# Patient Record
Sex: Male | Born: 1940 | Race: White | Hispanic: No | Marital: Married | Smoking: Former smoker
Health system: Southern US, Community
[De-identification: ages and names within clinical notes are randomized; demographics above are authoritative.]

## PROBLEM LIST (undated history)

## (undated) DIAGNOSIS — I499 Cardiac arrhythmia, unspecified: Secondary | ICD-10-CM

## (undated) DIAGNOSIS — N200 Calculus of kidney: Secondary | ICD-10-CM

## (undated) DIAGNOSIS — K219 Gastro-esophageal reflux disease without esophagitis: Secondary | ICD-10-CM

## (undated) DIAGNOSIS — G919 Hydrocephalus, unspecified: Secondary | ICD-10-CM

## (undated) DIAGNOSIS — E785 Hyperlipidemia, unspecified: Secondary | ICD-10-CM

## (undated) DIAGNOSIS — G629 Polyneuropathy, unspecified: Secondary | ICD-10-CM

## (undated) DIAGNOSIS — C801 Malignant (primary) neoplasm, unspecified: Secondary | ICD-10-CM

## (undated) DIAGNOSIS — Z972 Presence of dental prosthetic device (complete) (partial): Secondary | ICD-10-CM

## (undated) DIAGNOSIS — B019 Varicella without complication: Secondary | ICD-10-CM

## (undated) DIAGNOSIS — I471 Supraventricular tachycardia: Secondary | ICD-10-CM

## (undated) DIAGNOSIS — R51 Headache: Secondary | ICD-10-CM

## (undated) DIAGNOSIS — R519 Headache, unspecified: Secondary | ICD-10-CM

## (undated) DIAGNOSIS — M1990 Unspecified osteoarthritis, unspecified site: Secondary | ICD-10-CM

## (undated) DIAGNOSIS — F32A Depression, unspecified: Secondary | ICD-10-CM

## (undated) DIAGNOSIS — Z87442 Personal history of urinary calculi: Secondary | ICD-10-CM

## (undated) HISTORY — PX: SKIN CANCER EXCISION: SHX779

## (undated) HISTORY — PX: KIDNEY STONE SURGERY: SHX686

## (undated) HISTORY — PX: VASECTOMY: SHX75

## (undated) HISTORY — PX: TONSILLECTOMY: SUR1361

## (undated) HISTORY — DX: Gastro-esophageal reflux disease without esophagitis: K21.9

## (undated) HISTORY — DX: Varicella without complication: B01.9

---

## 2005-05-14 HISTORY — PX: PROSTATE BIOPSY: SHX241

## 2006-05-14 HISTORY — PX: CARDIAC CATHETERIZATION: SHX172

## 2007-05-15 DIAGNOSIS — I471 Supraventricular tachycardia, unspecified: Secondary | ICD-10-CM

## 2007-05-15 HISTORY — DX: Supraventricular tachycardia: I47.1

## 2007-05-15 HISTORY — DX: Supraventricular tachycardia, unspecified: I47.10

## 2008-05-14 HISTORY — PX: HEMORROIDECTOMY: SUR656

## 2013-04-14 ENCOUNTER — Ambulatory Visit: Payer: Medicare Other | Admitting: Podiatry

## 2013-04-14 ENCOUNTER — Encounter: Payer: Self-pay | Admitting: Podiatry

## 2013-04-14 VITALS — BP 121/77 | HR 78 | Resp 16 | Ht 70.0 in | Wt 190.0 lb

## 2013-04-14 DIAGNOSIS — M79609 Pain in unspecified limb: Secondary | ICD-10-CM

## 2013-04-14 DIAGNOSIS — L600 Ingrowing nail: Secondary | ICD-10-CM

## 2013-04-14 DIAGNOSIS — B351 Tinea unguium: Secondary | ICD-10-CM

## 2013-04-14 NOTE — Progress Notes (Signed)
Subjective:     Patient ID: Kou Gucciardo, male   DOB: 30-Sep-1940, 72 y.o.   MRN: [id number]  Toe Pain    patient states that I have chronic ingrown toenails of my big toe both feet and all of my nails become sore in the corners and are impossible for me to cut. Has tried wider shoes and soaks without relief and has seen previous doctor who trim them   Review of Systems  All other systems reviewed and are negative.       Objective:   Physical Exam  Nursing note and vitals reviewed. Constitutional: He is oriented to person, place, and time. He appears well-nourished.  Cardiovascular: Intact distal pulses.   Musculoskeletal: Normal range of motion.  Neurological: He is oriented to person, place, and time.  Skin: Skin is warm.   neurovascular status intact with incurvated lateral borders hallux bilateral that are tender when pressed and all nails have a mild amount of incurvation in the corners noted. Muscle strength adequate and no equinus condition was noted     Assessment:     Ingrown toenail hallux both feet and nail disease 235 both feet    Plan:     H&P performed and discussed treatment options. He would like 8 per minute procedure and I have recommended permanent removal of the corners hallux both feet and discussed risks associated with this. Patient wants procedures and today I infiltrated each hallux 60 mg Xylocaine Marcaine mixture remove the lateral corners exposed matrix and apply chemical phenol 3 applications followed by alcohol lavaged and sterile dressing. Instructed on soaks and today debrided remaining nails which gave him relief of low grade symptoms reappoint to recheck

## 2013-04-14 NOTE — Patient Instructions (Signed)

## 2013-04-14 NOTE — Progress Notes (Signed)
N PAIN L TRIM TOENAILS D YRS O SLOWLY C WORSE A AFTER TAKING SHOWERS T PEDICURE, PT TRIMS TOENAILS

## 2013-04-17 ENCOUNTER — Ambulatory Visit (INDEPENDENT_AMBULATORY_CARE_PROVIDER_SITE_OTHER): Payer: Medicare Other | Admitting: Podiatry

## 2013-04-17 ENCOUNTER — Encounter: Payer: Self-pay | Admitting: Podiatry

## 2013-04-17 VITALS — BP 146/87 | HR 71 | Resp 16

## 2013-04-17 DIAGNOSIS — L03039 Cellulitis of unspecified toe: Secondary | ICD-10-CM

## 2013-04-17 MED ORDER — CEPHALEXIN 500 MG PO CAPS: 500.0000 mg | ORAL_CAPSULE | Freq: Three times a day (TID) | ORAL | Status: DC

## 2013-04-18 NOTE — Progress Notes (Signed)
Subjective:     Patient ID: Adam Juarez, male   DOB: 11-30-1940, 72 y.o.   MRN: [id number]  HPI patient presents stating that I wouldn't wanted to get my nails checked as they are still red and crusting   Review of Systems     Objective:   Physical Exam Neurovascular status intact with no health history changes noted and some redness left hallux lateral side it's localized with no proximal edema erythema drainage or lymph node distention    Assessment:     Mild paronychia of the left hallux lateral    Plan:     Changes soaks to Epsom salts at the current time and placed on cephalexin 500 mg 3 times a day. If symptoms persist or do not improve patient is to let us know immediately

## 2013-05-15 ENCOUNTER — Telehealth: Payer: Self-pay | Admitting: *Deleted

## 2013-05-15 MED ORDER — CEPHALEXIN 500 MG PO CAPS: 500.0000 mg | ORAL_CAPSULE | Freq: Three times a day (TID) | ORAL | Status: DC

## 2013-05-15 NOTE — Telephone Encounter (Signed)
Pt called stated that his toe was still infected after the two rounds of antibiotics , i went ahead and refilled the prescription and told him to continue with soaking and if it continues not to get any better to schedule an appt with Dr Paulla Dolly

## 2013-07-14 ENCOUNTER — Encounter: Payer: Self-pay | Admitting: Podiatry

## 2013-07-14 ENCOUNTER — Ambulatory Visit: Payer: Medicare Other | Admitting: Podiatry

## 2013-07-14 VITALS — BP 154/93 | HR 83 | Resp 16 | Ht 70.0 in | Wt 194.0 lb

## 2013-07-14 DIAGNOSIS — L03039 Cellulitis of unspecified toe: Secondary | ICD-10-CM

## 2013-07-14 DIAGNOSIS — M79609 Pain in unspecified limb: Secondary | ICD-10-CM

## 2013-07-14 NOTE — Progress Notes (Signed)
Subjective:     Patient ID: Adam Juarez, male   DOB: 01/27/1941, 73 y.o.   MRN: [id number]  HPI patient presents with drainage on the lateral side of the right hallux which has been around but it's just near its 8. He doesn't have any trouble wearing shoe gear walking but he is just concerned   Review of Systems     Objective:   Physical Exam Nailbed found to be thickened with incurvation of the lateral side with very mild odor with no proximal edema erythema or drainage noted    Assessment:     Local paronychia infection right hallux with thick mycotic nails without any indication of proximal extension    Plan:     Debrided lateral side of nail bed and instructed on soaks and applied Band-Aid. Patient will begin topical antifungal and will be seen back if the symptoms were to persist. I did explain to him he is probably traumatized his nail and that is why is thickened away it has and is irritated

## 2013-07-20 ENCOUNTER — Ambulatory Visit: Payer: Self-pay | Admitting: Family Medicine

## 2013-08-08 ENCOUNTER — Inpatient Hospital Stay: Payer: Self-pay | Admitting: Family Medicine

## 2013-08-08 LAB — BASIC METABOLIC PANEL
Anion Gap: 6 — ABNORMAL LOW (ref 7–16)
BUN: 21 mg/dL — ABNORMAL HIGH (ref 7–18)
Calcium, Total: 8.9 mg/dL (ref 8.5–10.1)
Chloride: 108 mmol/L — ABNORMAL HIGH (ref 98–107)
Co2: 27 mmol/L (ref 21–32)
Creatinine: 0.98 mg/dL (ref 0.60–1.30)
EGFR (African American): >60
EGFR (Non-African Amer.): >60
Glucose: 104 mg/dL — ABNORMAL HIGH (ref 65–99)
Osmolality: 285 (ref 275–301)
POTASSIUM: 3.8 mmol/L (ref 3.5–5.1)
Sodium: 141 mmol/L (ref 136–145)

## 2013-08-08 LAB — CBC
HCT: 42.9 % (ref 40.0–52.0)
HGB: 15 g/dL (ref 13.0–18.0)
MCH: 32.5 pg (ref 26.0–34.0)
MCHC: 35 g/dL (ref 32.0–36.0)
MCV: 93 fL (ref 80–100)
Platelet: 150 10*3/uL (ref 150–440)
RBC: 4.62 10*6/uL (ref 4.40–5.90)
RDW: 13.8 % (ref 11.5–14.5)
WBC: 5.1 10*3/uL (ref 3.8–10.6)

## 2013-08-08 LAB — PRO B NATRIURETIC PEPTIDE: B-TYPE NATIURETIC PEPTID: 39 pg/mL (ref 0–125)

## 2013-08-08 LAB — TROPONIN I

## 2013-08-09 LAB — TROPONIN I: Troponin-I: <0.02 ng/mL

## 2013-09-11 DIAGNOSIS — I499 Cardiac arrhythmia, unspecified: Secondary | ICD-10-CM

## 2013-09-11 HISTORY — DX: Cardiac arrhythmia, unspecified: I49.9

## 2013-10-13 ENCOUNTER — Emergency Department: Payer: Self-pay | Admitting: Emergency Medicine

## 2013-10-13 LAB — URINALYSIS, COMPLETE
Bacteria: NONE SEEN
Bilirubin,UR: NEGATIVE
Blood: NEGATIVE
Glucose,UR: NEGATIVE mg/dL (ref 0–75)
KETONE: NEGATIVE
LEUKOCYTE ESTERASE: NEGATIVE
Nitrite: NEGATIVE
PROTEIN: NEGATIVE
Ph: 5 (ref 4.5–8.0)
RBC,UR: 25 /HPF (ref 0–5)
SPECIFIC GRAVITY: 1.029 (ref 1.003–1.030)
SQUAMOUS EPITHELIAL: NONE SEEN
WBC UR: 2 /HPF (ref 0–5)

## 2013-10-13 LAB — COMPREHENSIVE METABOLIC PANEL
ALT: 26 U/L (ref 12–78)
ANION GAP: 6 — AB (ref 7–16)
AST: 34 U/L (ref 15–37)
Albumin: 4.1 g/dL (ref 3.4–5.0)
Alkaline Phosphatase: 88 U/L
BUN: 24 mg/dL — AB (ref 7–18)
Bilirubin,Total: 0.5 mg/dL (ref 0.2–1.0)
CREATININE: 1.16 mg/dL (ref 0.60–1.30)
Calcium, Total: 9.2 mg/dL (ref 8.5–10.1)
Chloride: 106 mmol/L (ref 98–107)
Co2: 25 mmol/L (ref 21–32)
EGFR (African American): >60
Glucose: 83 mg/dL (ref 65–99)
Osmolality: 277 (ref 275–301)
Potassium: 4.1 mmol/L (ref 3.5–5.1)
Sodium: 137 mmol/L (ref 136–145)
Total Protein: 7.8 g/dL (ref 6.4–8.2)

## 2013-10-13 LAB — CBC
HCT: 45.2 % (ref 40.0–52.0)
HGB: 15.4 g/dL (ref 13.0–18.0)
MCH: 32.2 pg (ref 26.0–34.0)
MCHC: 34.2 g/dL (ref 32.0–36.0)
MCV: 94 fL (ref 80–100)
Platelet: 156 10*3/uL (ref 150–440)
RBC: 4.79 10*6/uL (ref 4.40–5.90)
RDW: 13.6 % (ref 11.5–14.5)
WBC: 6.3 10*3/uL (ref 3.8–10.6)

## 2013-11-04 ENCOUNTER — Encounter (HOSPITAL_COMMUNITY): Payer: Self-pay | Admitting: Pharmacy Technician

## 2013-11-04 ENCOUNTER — Other Ambulatory Visit: Payer: Self-pay | Admitting: Urology

## 2013-11-05 ENCOUNTER — Encounter (HOSPITAL_COMMUNITY): Payer: Self-pay | Admitting: *Deleted

## 2013-11-05 NOTE — Progress Notes (Signed)
patient states he was at [facility] with irregular Heart rate stayed over night and flipped back into a regular heart rthym No other procedures done and sent home on no medications. Denies any chest pain or heart attack.  Contacted [place] [facility] for records.

## 2013-11-05 NOTE — Progress Notes (Signed)
Get patient in for EKG ASAP and will need read prior to ESWL.

## 2013-11-05 NOTE — Progress Notes (Signed)
Call to [place] at Dr. Shaune Leeks office that patient records from [facility] indicate he did not have any EKG or cardiac work up and eloped from the ER without any treatment or follow up. Lab work and CT abdomen

## 2013-11-06 ENCOUNTER — Ambulatory Visit (HOSPITAL_COMMUNITY): Admission: RE | Admit: 2013-11-06 | Payer: Medicare Other | Source: Ambulatory Visit

## 2013-11-09 ENCOUNTER — Encounter (HOSPITAL_COMMUNITY): Payer: Self-pay | Admitting: *Deleted

## 2013-11-09 ENCOUNTER — Encounter (HOSPITAL_COMMUNITY)
Admission: RE | Disposition: A | Discharge status: Home / Self Care | Payer: Self-pay | Source: Ambulatory Visit | Attending: Urology

## 2013-11-09 ENCOUNTER — Ambulatory Visit (HOSPITAL_COMMUNITY)
Admission: RE | Admit: 2013-11-09 | Discharge: 2013-11-09 | Disposition: A | Discharge status: Home / Self Care | Payer: Medicare Other | Source: Ambulatory Visit | Attending: Urology | Admitting: Urology

## 2013-11-09 ENCOUNTER — Ambulatory Visit (HOSPITAL_COMMUNITY): Payer: Medicare Other

## 2013-11-09 DIAGNOSIS — Z87442 Personal history of urinary calculi: Secondary | ICD-10-CM | POA: Insufficient documentation

## 2013-11-09 DIAGNOSIS — Z87891 Personal history of nicotine dependence: Secondary | ICD-10-CM | POA: Insufficient documentation

## 2013-11-09 DIAGNOSIS — Z79899 Other long term (current) drug therapy: Secondary | ICD-10-CM | POA: Insufficient documentation

## 2013-11-09 DIAGNOSIS — K219 Gastro-esophageal reflux disease without esophagitis: Secondary | ICD-10-CM | POA: Insufficient documentation

## 2013-11-09 DIAGNOSIS — E7800 Pure hypercholesterolemia, unspecified: Secondary | ICD-10-CM | POA: Insufficient documentation

## 2013-11-09 DIAGNOSIS — I4891 Unspecified atrial fibrillation: Secondary | ICD-10-CM | POA: Insufficient documentation

## 2013-11-09 DIAGNOSIS — Z01810 Encounter for preprocedural cardiovascular examination: Secondary | ICD-10-CM | POA: Insufficient documentation

## 2013-11-09 DIAGNOSIS — Z01818 Encounter for other preprocedural examination: Secondary | ICD-10-CM | POA: Insufficient documentation

## 2013-11-09 DIAGNOSIS — N201 Calculus of ureter: Secondary | ICD-10-CM | POA: Insufficient documentation

## 2013-11-09 DIAGNOSIS — Z85828 Personal history of other malignant neoplasm of skin: Secondary | ICD-10-CM | POA: Insufficient documentation

## 2013-11-09 HISTORY — DX: Calculus of kidney: N20.0

## 2013-11-09 HISTORY — DX: Unspecified osteoarthritis, unspecified site: M19.90

## 2013-11-09 HISTORY — DX: Cardiac arrhythmia, unspecified: I49.9

## 2013-11-09 HISTORY — DX: Malignant (primary) neoplasm, unspecified: C80.1

## 2013-11-09 SURGERY — LITHOTRIPSY, ESWL
Anesthesia: LOCAL | Laterality: Left

## 2013-11-09 MED ORDER — SODIUM CHLORIDE 0.9 % IV SOLN
INTRAVENOUS | Status: DC
  Administered 2013-11-09: 11:00:00 via INTRAVENOUS

## 2013-11-09 MED ORDER — DIAZEPAM 5 MG PO TABS
10.0000 mg | ORAL_TABLET | ORAL | Status: AC
  Administered 2013-11-09: 10 mg via ORAL
  Filled 2013-11-09: qty 2

## 2013-11-09 MED ORDER — ACETAMINOPHEN-CODEINE #3 300-30 MG PO TABS: 1.0000 | ORAL_TABLET | ORAL | Status: DC | PRN

## 2013-11-09 MED ORDER — CIPROFLOXACIN HCL 500 MG PO TABS
500.0000 mg | ORAL_TABLET | ORAL | Status: AC
  Administered 2013-11-09: 500 mg via ORAL
  Filled 2013-11-09: qty 1

## 2013-11-09 MED ORDER — DIPHENHYDRAMINE HCL 25 MG PO CAPS
25.0000 mg | ORAL_CAPSULE | ORAL | Status: AC
  Administered 2013-11-09: 25 mg via ORAL
  Filled 2013-11-09: qty 1

## 2013-11-09 NOTE — H&P (Signed)
Reason For Visit left 84mm UVJ stone   History of Present Illness Adam Juarez referred by Dr. Leodis Sias, MD for evaluation and management of nephrolithiasis.  Patient intially presented to the [facility] ED with acute onset left flank pain. He was not seen - left early b/c of the wait times. He did get a CT scan. He then followed up with his PCP and was referred to Urology, however he delayed his appt because of a trip. Currently the patient denies any significant pain he does endorse urinary urgency and frequency. He denies any suprapubic pain or dysuria. Denies any fever/chills. The patient has a history of kidney stones, he ultimately needed a ureteroscopy for a 3 mm stone many years ago. The patient was given Flomax for medical expulsion therapy which he has not started yet.   Past Medical History Problems  1. History of atrial fibrillation (V12.59) 2. History of basal cell carcinoma (V10.83) 3. History of esophageal reflux (V12.79) 4. History of hypercholesterolemia (V12.29) 5. 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 History of Surgery Of Male Genitalia Vasectomy  Current Meds 1. Co Q-10 CAPS;  Therapy: (Recorded:23Jun2015) to Recorded 2. Glucosamine Chondroitin TABS;  Therapy: (Recorded:23Jun2015) to Recorded 3. Ibuprofen 600 MG Oral Tablet;  Therapy: (WUJWJXBJ:[id number]) to Recorded 4. Magnesium TABS;  Therapy: (HYQMVHQI:[id number]) to Recorded 5. Multiple Vitamin TABS;  Therapy: (Recorded:23Jun2015) to Recorded 6. Omeprazole 20 MG Oral Tablet Delayed Release;  Therapy: (Recorded:23Jun2015) to Recorded 7. Probiotic CAPS;  Therapy: (Recorded:23Jun2015) to Recorded 8. Vitamin B12 TABS;  Therapy: (Recorded:23Jun2015) to Recorded 9. Vitamin C TABS;  Therapy: (Recorded:23Jun2015) to Recorded  Allergies Medication  1. No Known Drug Allergies Non-Medication  2. Bee sting  Family History Problems  1. Family history of aortic aneurysm (V17.49)  : Father 2. Family history of Parkinson's disease (V17.2) : Mother  Social History Problems    Denied: History of Alcohol use   Caffeine use (V49.89)   Death in the family, father   age 62   Death in the family, mother   age 9   Former smoker (V15.82)   quit 2000   Married   Retired   Two children  Review of Systems Genitourinary, constitutional, skin, eye, otolaryngeal, hematologic/lymphatic, cardiovascular, pulmonary, endocrine, musculoskeletal, gastrointestinal, neurological and psychiatric system(s) were reviewed and pertinent findings if present are noted.  Genitourinary: nocturia and initiating urination requires straining, but urine stream is not weak.  Gastrointestinal: heartburn.    Vitals Vital Signs [Data Includes: Last 1 Day]  Recorded: 23Jun2015 12:49PM  Height: 5 ft 10 in Weight: 195 lb  BMI Calculated: 27.98 BSA Calculated: 2.06 Blood Pressure: 132 / 87 Temperature: 98 F Heart Rate: 75  Physical Exam Constitutional: Well nourished and well developed . No acute distress.  ENT:. The ears and nose are normal in appearance.  Neck: The appearance of the neck is normal and no neck mass is present.  Pulmonary: No respiratory distress, normal respiratory rhythm and effort and clear bilateral breath sounds.  Cardiovascular: Heart rate and rhythm are normal . The arterial pulses are normal. No peripheral edema.  Abdomen: The abdomen is soft and nontender. No masses are palpated. No CVA tenderness. No hernias are palpable. No hepatosplenomegaly noted.  Lymphatics: The femoral and inguinal nodes are not enlarged or tender.  Skin: Normal skin turgor, no visible rash and no visible skin lesions.  Neuro/Psych:. Mood and affect are appropriate.    Results/Data Urine [Data Includes: Last 1 Day]   [id number]  COLOR  YELLOW   APPEARANCE CLEAR   SPECIFIC GRAVITY 1.025   pH 5.5   GLUCOSE NEG mg/dL  BILIRUBIN NEG   KETONE NEG mg/dL  BLOOD NEG   PROTEIN NEG  mg/dL  UROBILINOGEN 0.2 mg/dL  NITRITE NEG   LEUKOCYTE ESTERASE NEG    The following images/tracing/specimen were independently visualized: . CT scan from 10/13/13. Left hydronephrosis/hydroureter with 52mm stone proximal to the left UVJ. No other stones within the collecting system   KUB was performed in our clinic today to see if the stone easily localized. There is a calcification noted in the left distal ureter consistent with a constipation seen on the patient's CT scout film. There are additional phleboliths within the pelvis but localized more lateral than would be expected for the trajectory of the ureter on the left. The renal shadows are visible bilaterally with no additional identifiable stones.    Assessment Assessed  1. Left ureteral calculus (592.1)  The patient has a 5.5 mm distal ureteral stone and no evidence of infection and pain currently well controlled. She   Plan Health Maintenance  1. UA With REFLEX; [Do Not Release]; Status:Complete;   Done: [id number] 12:12PM Left ureteral calculus  2. Follow-up Schedule Surgery Office  Follow-up  Status: Complete  Done: [id number] 3. KUB; Status:Complete;   Done: 04UGQ9169 12:00AM  Discussion/Summary I discussed the management options with the patient including continued medical expulsion therapy, shockwave lithotripsy and ureteroscopy. We went over the pros and cons of all three and have collectively decided to perform shockwave lithotripsy. I Went over the risks and benefits of this operation as well as the procedure in detail. We'll work on getting the patient scheduled as soon as possible. In the meantime, I have encouraged the patient to start his Flomax, and if he passes his stone he should call and cancel his operation.

## 2013-11-09 NOTE — Discharge Instructions (Signed)
See [facility] discharge instructions in chart.  

## 2013-11-09 NOTE — Op Note (Signed)
See [facility] OP note scanned into chart. 

## 2014-05-20 DIAGNOSIS — F331 Major depressive disorder, recurrent, moderate: Secondary | ICD-10-CM | POA: Diagnosis not present

## 2014-06-25 DIAGNOSIS — L570 Actinic keratosis: Secondary | ICD-10-CM | POA: Diagnosis not present

## 2014-06-25 DIAGNOSIS — Z85828 Personal history of other malignant neoplasm of skin: Secondary | ICD-10-CM | POA: Diagnosis not present

## 2014-06-25 DIAGNOSIS — X32XXXA Exposure to sunlight, initial encounter: Secondary | ICD-10-CM | POA: Diagnosis not present

## 2014-07-05 DIAGNOSIS — Z8659 Personal history of other mental and behavioral disorders: Secondary | ICD-10-CM | POA: Diagnosis not present

## 2014-09-04 NOTE — Discharge Summary (Signed)
PATIENT NAME:  Adam Juarez, Adam Juarez MR#:  [id number] DATE OF BIRTH:  Oct 01, 1940  DATE OF ADMISSION:  08/08/2013 DATE OF DISCHARGE:  08/09/2013  DISCHARGE DIAGNOSES:  1. Brief episode of atrial fibrillation with rapid ventricular response now sinus rhythm.  2. Gastroesophageal reflux disease.   DISCHARGE MEDICATIONS:  1. Aspirin 81 mg p.o. daily.  2. Chondroitin glucosamine 1 daily.  3. Omeprazole 20 mg p.o. daily.   CONSULTS: None.   PROCEDURE: Had an echo performed that is still pending.   PERTINENT LABORATORIES AND STUDIES: On the day of discharge: Cardiac enzymes were negative x 3. Heart rate is 80s to 90s. Sodium 141, potassium 3.8, creatinine 0.98. White blood cell count 5.1, hemoglobin 15, platelets 150,000. TSH pending.   Chest x-ray was negative.   BRIEF HOSPITAL COURSE: Atrial fibrillation with RVR. The patient initially came in with a heart rate elevated with EKG consistent with atrial fibrillation with elevated heart rate. He denies any further symptoms at this time. No chest pain, no palpitations, no syncope. No dizziness, vision changes. He declines any beta blocker or rate-controlling agent at this time. He is back in normal sinus rhythm. Will continue on an aspirin and will discuss with him further after he researched the metoprolol whether or not to start this medication as an outpatient. He will follow up this week. We will repeat an EKG at that time and reassess.   DISPOSITION: He is in stable condition to be discharged to home.   FOLLOWUP: Follow up with me in the next 3 to 4 days.     ____________________________ Dion Body, MD kl:lt D: 08/09/2013 11:32:28 ET T: 08/09/2013 20:48:25 ET JOB#: [id number]  cc: Dion Body, MD, <Dictator> Dion Body MD ELECTRONICALLY SIGNED 08/17/2013 10:36

## 2014-09-04 NOTE — H&P (Signed)
PATIENT NAME:  Adam Juarez, Adam Juarez MR#:  [id number] DATE OF BIRTH:  1941/03/13  DATE OF ADMISSION:  08/08/2013  CHIEF COMPLAINT: Palpitations.   HISTORY OF PRESENT ILLNESS: A 74 year old Caucasian male with a past medical history of gastroesophageal reflux disease, as well as SVT, presenting with palpitations. He describes acute onset of palpitations describes as fluttering in the chest as well as associated retrosternal chest pain described as pressure, a 3 in intensity, radiating to left arm. No worsening or relieving factors. Tried vagal maneuvers. Had no improvement. Therefore presented to ED for further workup and evaluation. On arrival, found to be in atrial fibrillation, heart rate in the 110s. Received Cardizem IV x 1, heart rate now rate controlled in the 80s to 90s however, still feels periodic palpitations. Otherwise, no further complaints.   REVIEW OF SYSTEMS:  CONSTITUTIONAL: Denies fever, fatigue, weakness. EYES: Denies blurry, double vision, or eye pain.  HEENT: Denies tinnitus, ear pain, hearing loss.  RESPIRATORY: Denies cough, wheeze, shortness of breath. CARDIOVASCULAR: Positive for palpitations and chest pain as described above. Denies any orthopnea, edema.  GASTROINTESTINAL: Denies nausea, vomiting, diarrhea, abdominal pain.  GENITOURINARY: Denies history of hematuria.  ENDOCRINE: Denies nocturia or thyroid problems.  HEMATOLOGIC AND LYMPHATIC: Denies easy bruising, bleeding. SKIN: Denies rash or lesions. MUSCULOSKELETAL: Denies pain in neck, back, shoulders, knees or hips.  NEUROLOGIC: Denies paralysis or paresthesias.  PSYCHIATRIC: Denies anxiety or depressive symptoms.   Otherwise, full review of systems performed by me and is negative.  PAST MEDICAL HISTORY: Gastroesophageal reflux disease.   SOCIAL HISTORY: Denies alcohol, tobacco, or drug usage.   FAMILY HISTORY: Denies any known cardiovascular illnesses.   ALLERGIES: BEE STINGS.   HOME MEDICATIONS: Include  Prilosec 20 mg p.o. daily.   PHYSICAL EXAMINATION:  VITAL SIGNS: Temperature 97.8, heart rate on arrival 122, respirations 18, blood pressure 163/107, saturating 94% on room air. Weight 88.5 kg BMI 28.   GENERAL: Well-nourished, well-developed Caucasian male in, no acute distress.  HEAD: Normocephalic, atraumatic.  EYES: Pupils equal, round, reactive to light. Extraocular muscles intact. No scleral icterus.  MOUTH: Moist mucous membranes.  Dentition poor. No abscess noted.  EARS, NOSE, AND THROAT: Clear. No exudates. No external lesions.  NECK: Supple. No thyromegaly. No nodules. No JVD.  PULMONARY: Clear to auscultation bilaterally without wheezes, rales, or rhonchi. No use of accessory muscles. Good respiratory effort.  CHEST:  Nontender on palpation.  CARDIOVASCULAR: S1, S2, irregular rate, irregular rhythm. No murmurs, rubs, or gallops. No edema. Pedal pulses 2+ bilaterally.  GASTROINTESTINAL: Soft, nontender, nondistended. No masses. Positive bowel sounds. No hepatosplenomegaly.  MUSCULOSKELETAL: No swelling, clubbing, or edema. Range of motion is full in all extremities. NEUROLOGIC: Cranial nerves II through XII intact. No gross neurologic deficits. Sensation intact. Reflexes intact.  SKIN: No ulcerations, lesions, no rashes, or cyanosis. Skin warm and dry. Turgor intact. PSYCHIATRIC: Mood and affect within normal limits. The patient alert and oriented x 3. Insight and judgment intact.   LABORATORY DATA: EKG performed: Atrial fibrillation . Heart rate of 106. Remainder of laboratory data: Sodium 141, potassium 3.8, chloride 108, bicarbonate 27, BUN 21, creatinine 0.98, glucose 104. Troponin I less than 0.02. WBC 5.1, hemoglobin 15, platelets of 150,000. Chest x-ray performed revealing no acute cardiopulmonary process.   ASSESSMENT AND PLAN: A 74 year old Caucasian gentleman with history of supraventricular tachycardia, presenting with palpitations. 1. New onset atrial fibrillation. Admit  to telemetry. Trend cardiac enzymes x 3. Will check a transthoracic echocardiogram as well as TSH. He is currently rate  controlled. [place] score of zero. We will start aspirin. 2. Gastroesophageal reflux disease. Continue proton pump inhibitor therapy.  3. Venous thromboembolism prophylaxis with heparin subcutaneous.   CODE STATUS: The patient is full code.   TIME SPENT: 45 minutes    ____________________________ Aaron Mose. Iain Sawchuk, MD dkh:lt D: 08/09/2013 00:19:42 ET T: 08/09/2013 01:54:04 ET JOB#: [id number]  cc: Aaron Mose. Jovany Disano, MD, <Dictator> Kayliana Codd Woodfin Ganja MD ELECTRONICALLY SIGNED 08/09/2013 2:59

## 2014-10-21 DIAGNOSIS — I479 Paroxysmal tachycardia, unspecified: Secondary | ICD-10-CM | POA: Diagnosis not present

## 2014-10-21 DIAGNOSIS — K219 Gastro-esophageal reflux disease without esophagitis: Secondary | ICD-10-CM | POA: Diagnosis not present

## 2015-03-08 DIAGNOSIS — H2513 Age-related nuclear cataract, bilateral: Secondary | ICD-10-CM | POA: Diagnosis not present

## 2015-03-08 DIAGNOSIS — H353131 Nonexudative age-related macular degeneration, bilateral, early dry stage: Secondary | ICD-10-CM | POA: Diagnosis not present

## 2015-03-16 DIAGNOSIS — L239 Allergic contact dermatitis, unspecified cause: Secondary | ICD-10-CM | POA: Diagnosis not present

## 2015-04-14 DIAGNOSIS — K219 Gastro-esophageal reflux disease without esophagitis: Secondary | ICD-10-CM | POA: Diagnosis not present

## 2015-04-14 DIAGNOSIS — Z0000 Encounter for general adult medical examination without abnormal findings: Secondary | ICD-10-CM | POA: Diagnosis not present

## 2015-04-21 DIAGNOSIS — K219 Gastro-esophageal reflux disease without esophagitis: Secondary | ICD-10-CM | POA: Diagnosis not present

## 2015-04-21 DIAGNOSIS — I479 Paroxysmal tachycardia, unspecified: Secondary | ICD-10-CM | POA: Diagnosis not present

## 2015-04-21 DIAGNOSIS — E781 Pure hyperglyceridemia: Secondary | ICD-10-CM | POA: Diagnosis not present

## 2015-04-21 DIAGNOSIS — M25552 Pain in left hip: Secondary | ICD-10-CM | POA: Diagnosis not present

## 2015-04-21 DIAGNOSIS — M25551 Pain in right hip: Secondary | ICD-10-CM | POA: Diagnosis not present

## 2015-04-28 DIAGNOSIS — M160 Bilateral primary osteoarthritis of hip: Secondary | ICD-10-CM | POA: Diagnosis not present

## 2015-06-24 DIAGNOSIS — D485 Neoplasm of uncertain behavior of skin: Secondary | ICD-10-CM | POA: Diagnosis not present

## 2015-06-24 DIAGNOSIS — Z08 Encounter for follow-up examination after completed treatment for malignant neoplasm: Secondary | ICD-10-CM | POA: Diagnosis not present

## 2015-06-24 DIAGNOSIS — Z85828 Personal history of other malignant neoplasm of skin: Secondary | ICD-10-CM | POA: Diagnosis not present

## 2015-06-24 DIAGNOSIS — D0439 Carcinoma in situ of skin of other parts of face: Secondary | ICD-10-CM | POA: Diagnosis not present

## 2015-06-24 DIAGNOSIS — D2239 Melanocytic nevi of other parts of face: Secondary | ICD-10-CM | POA: Diagnosis not present

## 2015-06-24 DIAGNOSIS — X32XXXA Exposure to sunlight, initial encounter: Secondary | ICD-10-CM | POA: Diagnosis not present

## 2015-06-24 DIAGNOSIS — L570 Actinic keratosis: Secondary | ICD-10-CM | POA: Diagnosis not present

## 2015-07-08 DIAGNOSIS — H2513 Age-related nuclear cataract, bilateral: Secondary | ICD-10-CM | POA: Diagnosis not present

## 2015-07-11 DIAGNOSIS — H2513 Age-related nuclear cataract, bilateral: Secondary | ICD-10-CM | POA: Diagnosis not present

## 2015-07-18 ENCOUNTER — Encounter: Payer: Self-pay | Admitting: *Deleted

## 2015-07-22 NOTE — Discharge Instructions (Signed)

## 2015-07-27 ENCOUNTER — Ambulatory Visit: Payer: Medicare Other | Admitting: Anesthesiology

## 2015-07-27 ENCOUNTER — Encounter
Admission: RE | Disposition: A | Discharge status: Home / Self Care | Payer: Self-pay | Source: Ambulatory Visit | Attending: Ophthalmology

## 2015-07-27 ENCOUNTER — Ambulatory Visit
Admission: RE | Admit: 2015-07-27 | Discharge: 2015-07-27 | Disposition: A | Discharge status: Home / Self Care | Payer: Medicare Other | Source: Ambulatory Visit | Attending: Ophthalmology | Admitting: Ophthalmology

## 2015-07-27 DIAGNOSIS — Z87442 Personal history of urinary calculi: Secondary | ICD-10-CM | POA: Insufficient documentation

## 2015-07-27 DIAGNOSIS — Z85828 Personal history of other malignant neoplasm of skin: Secondary | ICD-10-CM | POA: Diagnosis not present

## 2015-07-27 DIAGNOSIS — Z9889 Other specified postprocedural states: Secondary | ICD-10-CM | POA: Diagnosis not present

## 2015-07-27 DIAGNOSIS — I471 Supraventricular tachycardia: Secondary | ICD-10-CM | POA: Insufficient documentation

## 2015-07-27 DIAGNOSIS — H2512 Age-related nuclear cataract, left eye: Secondary | ICD-10-CM | POA: Insufficient documentation

## 2015-07-27 DIAGNOSIS — I4891 Unspecified atrial fibrillation: Secondary | ICD-10-CM | POA: Insufficient documentation

## 2015-07-27 DIAGNOSIS — K219 Gastro-esophageal reflux disease without esophagitis: Secondary | ICD-10-CM | POA: Insufficient documentation

## 2015-07-27 DIAGNOSIS — H269 Unspecified cataract: Secondary | ICD-10-CM | POA: Diagnosis present

## 2015-07-27 DIAGNOSIS — Z9852 Vasectomy status: Secondary | ICD-10-CM | POA: Diagnosis not present

## 2015-07-27 DIAGNOSIS — Z79899 Other long term (current) drug therapy: Secondary | ICD-10-CM | POA: Diagnosis not present

## 2015-07-27 DIAGNOSIS — Z87891 Personal history of nicotine dependence: Secondary | ICD-10-CM | POA: Diagnosis not present

## 2015-07-27 DIAGNOSIS — Z791 Long term (current) use of non-steroidal anti-inflammatories (NSAID): Secondary | ICD-10-CM | POA: Insufficient documentation

## 2015-07-27 DIAGNOSIS — H2513 Age-related nuclear cataract, bilateral: Secondary | ICD-10-CM | POA: Diagnosis not present

## 2015-07-27 HISTORY — DX: Supraventricular tachycardia: I47.1

## 2015-07-27 HISTORY — DX: Headache, unspecified: R51.9

## 2015-07-27 HISTORY — PX: CATARACT EXTRACTION W/PHACO: SHX586

## 2015-07-27 HISTORY — DX: Headache: R51

## 2015-07-27 HISTORY — DX: Presence of dental prosthetic device (complete) (partial): Z97.2

## 2015-07-27 SURGERY — PHACOEMULSIFICATION, CATARACT, WITH IOL INSERTION
Anesthesia: Monitor Anesthesia Care | Site: Eye | Laterality: Left | Wound class: Clean

## 2015-07-27 MED ORDER — ACETAMINOPHEN 160 MG/5ML PO SOLN: 325.0000 mg | ORAL | Status: DC | PRN

## 2015-07-27 MED ORDER — MIDAZOLAM HCL 2 MG/2ML IJ SOLN
INTRAMUSCULAR | Status: DC | PRN
  Administered 2015-07-27: 2 mg via INTRAVENOUS

## 2015-07-27 MED ORDER — ARMC OPHTHALMIC DILATING GEL
1.0000 "application " | OPHTHALMIC | Status: DC | PRN
  Administered 2015-07-27 (×2): 1 via OPHTHALMIC

## 2015-07-27 MED ORDER — POVIDONE-IODINE 5 % OP SOLN
1.0000 "application " | OPHTHALMIC | Status: DC | PRN
  Administered 2015-07-27: 1 via OPHTHALMIC

## 2015-07-27 MED ORDER — TIMOLOL MALEATE 0.5 % OP SOLN
OPHTHALMIC | Status: DC | PRN
  Administered 2015-07-27: 1 [drp] via OPHTHALMIC

## 2015-07-27 MED ORDER — BRIMONIDINE TARTRATE 0.2 % OP SOLN
OPHTHALMIC | Status: DC | PRN
  Administered 2015-07-27: 1 [drp] via OPHTHALMIC

## 2015-07-27 MED ORDER — TETRACAINE HCL 0.5 % OP SOLN
OPHTHALMIC | Status: DC | PRN
  Administered 2015-07-27: 4 [drp] via OPHTHALMIC

## 2015-07-27 MED ORDER — NA HYALUR & NA CHOND-NA HYALUR 0.4-0.35 ML IO KIT
PACK | INTRAOCULAR | Status: DC | PRN
  Administered 2015-07-27: 1 mL via INTRAOCULAR

## 2015-07-27 MED ORDER — LACTATED RINGERS IV SOLN: INTRAVENOUS | Status: DC

## 2015-07-27 MED ORDER — FENTANYL CITRATE (PF) 100 MCG/2ML IJ SOLN
INTRAMUSCULAR | Status: DC | PRN
  Administered 2015-07-27 (×2): 50 ug via INTRAVENOUS

## 2015-07-27 MED ORDER — TETRACAINE HCL 0.5 % OP SOLN
1.0000 [drp] | OPHTHALMIC | Status: DC | PRN
  Administered 2015-07-27: 1 [drp] via OPHTHALMIC

## 2015-07-27 MED ORDER — CEFUROXIME OPHTHALMIC INJECTION 1 MG/0.1 ML
INJECTION | OPHTHALMIC | Status: DC | PRN
  Administered 2015-07-27: 0.1 mL via INTRACAMERAL

## 2015-07-27 MED ORDER — EPINEPHRINE HCL 1 MG/ML IJ SOLN
INTRAOCULAR | Status: DC | PRN
  Administered 2015-07-27: 76 mL via OPHTHALMIC

## 2015-07-27 MED ORDER — ACETAMINOPHEN 325 MG PO TABS: 325.0000 mg | ORAL_TABLET | ORAL | Status: DC | PRN

## 2015-07-27 SURGICAL SUPPLY — 28 items
CANNULA ANT/CHMB 27GA (MISCELLANEOUS) ×3 IMPLANT
CARTRIDGE ABBOTT (MISCELLANEOUS) ×3 IMPLANT
GLOVE SURG LX 7.5 STRW (GLOVE) ×2
GLOVE SURG LX STRL 7.5 STRW (GLOVE) ×1 IMPLANT
GLOVE SURG TRIUMPH 8.0 PF LTX (GLOVE) ×3 IMPLANT
GOWN STRL REUS W/ TWL LRG LVL3 (GOWN DISPOSABLE) ×2 IMPLANT
GOWN STRL REUS W/TWL LRG LVL3 (GOWN DISPOSABLE) ×4
LENS IOL TECNIS TRC I 225 22.5 (Intraocular Lens) ×1 IMPLANT
LENS IOL TORIC 22.5 (Intraocular Lens) ×2 IMPLANT
LENS IOL TORIC 225 22.5 (Intraocular Lens) ×1 IMPLANT
MARKER SKIN DUAL TIP RULER LAB (MISCELLANEOUS) ×3 IMPLANT
NDL RETROBULBAR .5 NSTRL (NEEDLE) IMPLANT
NEEDLE FILTER BLUNT 18X 1/2SAF (NEEDLE) ×2
NEEDLE FILTER BLUNT 18X1 1/2 (NEEDLE) ×1 IMPLANT
PACK CATARACT BRASINGTON (MISCELLANEOUS) ×3 IMPLANT
PACK EYE AFTER SURG (MISCELLANEOUS) ×3 IMPLANT
PACK OPTHALMIC (MISCELLANEOUS) ×3 IMPLANT
RING MALYGIN 7.0 (MISCELLANEOUS) IMPLANT
SUT ETHILON 10-0 CS-B-6CS-B-6 (SUTURE)
SUT VICRYL  9 0 (SUTURE)
SUT VICRYL 9 0 (SUTURE) IMPLANT
SUTURE EHLN 10-0 CS-B-6CS-B-6 (SUTURE) IMPLANT
SYR 3ML LL SCALE MARK (SYRINGE) ×3 IMPLANT
SYR 5ML LL (SYRINGE) IMPLANT
SYR TB 1ML LUER SLIP (SYRINGE) ×3 IMPLANT
WATER STERILE IRR 250ML POUR (IV SOLUTION) ×3 IMPLANT
WATER STERILE IRR 500ML POUR (IV SOLUTION) IMPLANT
WIPE NON LINTING 3.25X3.25 (MISCELLANEOUS) ×3 IMPLANT

## 2015-07-27 NOTE — Anesthesia Preprocedure Evaluation (Signed)
Anesthesia Evaluation  Patient identified by MRN, date of birth, ID band  Reviewed: Allergy & Precautions, H&P , NPO status , Patient's Chart, lab work & pertinent test results  Airway Mallampati: I  TM Distance: >3 FB Neck ROM: full    Dental  (+) Upper Dentures   Pulmonary former smoker,    Pulmonary exam normal        Cardiovascular + dysrhythmias Atrial Fibrillation and Supra Ventricular Tachycardia  Rhythm:regular Rate:Normal     Neuro/Psych    GI/Hepatic GERD  ,  Endo/Other    Renal/GU      Musculoskeletal   Abdominal   Peds  Hematology   Anesthesia Other Findings   Reproductive/Obstetrics                             Anesthesia Physical Anesthesia Plan  ASA: II  Anesthesia Plan: MAC   Post-op Pain Management:    Induction:   Airway Management Planned:   Additional Equipment:   Intra-op Plan:   Post-operative Plan:   Informed Consent: I have reviewed the patients History and Physical, chart, labs and discussed the procedure including the risks, benefits and alternatives for the proposed anesthesia with the patient or authorized representative who has indicated his/her understanding and acceptance.     Plan Discussed with: CRNA  Anesthesia Plan Comments:         Anesthesia Quick Evaluation

## 2015-07-27 NOTE — H&P (Signed)
  The History and Physical notes are on paper, have been signed, and are to be scanned. The patient remains stable and unchanged from the H&P.   Previous H&P reviewed, patient examined, and there are no changes.  Zeev Deakins 07/27/2015 9:32 AM

## 2015-07-27 NOTE — Anesthesia Postprocedure Evaluation (Signed)
Anesthesia Post Note  Patient: Adam Juarez  Procedure(s) Performed: Procedure(s) (LRB): CATARACT EXTRACTION PHACO AND INTRAOCULAR LENS PLACEMENT (I[facility]) (Left)  Patient location during evaluation: PACU Anesthesia Type: MAC Level of consciousness: awake and alert and oriented Pain management: satisfactory to patient Vital Signs Assessment: post-procedure vital signs reviewed and stable Respiratory status: spontaneous breathing, nonlabored ventilation and respiratory function stable Cardiovascular status: blood pressure returned to baseline and stable Postop Assessment: Adequate PO intake and No signs of nausea or vomiting Anesthetic complications: no    Raliegh Ip

## 2015-07-27 NOTE — Transfer of Care (Signed)
Immediate Anesthesia Transfer of Care Note  Patient: Adam Juarez  Procedure(s) Performed: Procedure(s) with comments: CATARACT EXTRACTION PHACO AND INTRAOCULAR LENS PLACEMENT ([place]) (Left) - TORIC  Patient Location: PACU  Anesthesia Type: MAC  Level of Consciousness: awake, alert  and patient cooperative  Airway and Oxygen Therapy: Patient Spontanous Breathing and Patient connected to supplemental oxygen  Post-op Assessment: Post-op Vital signs reviewed, Patient's Cardiovascular Status Stable, Respiratory Function Stable, Patent Airway and No signs of Nausea or vomiting  Post-op Vital Signs: Reviewed and stable  Complications: No apparent anesthesia complications

## 2015-07-27 NOTE — Op Note (Signed)
LOCATION:  [facility]   PREOPERATIVE DIAGNOSIS:  Nuclear sclerotic cataract of the left eye.  H25.12  POSTOPERATIVE DIAGNOSIS:  Nuclear sclerotic cataract of the left eye.   PROCEDURE:  Phacoemulsification with Toric posterior chamber intraocular lens placement of the left eye.   LENS:  Implant Name Type Inv. Item Serial No. Manufacturer Lot No. LRB No. Used  [employer] TECHNIS TORIC LENS     [id number]     Left 1   ZCT 225 Toric intraocular lens with 2.25 diopters of cylindrical power with axis orientation at 174 degrees.   ULTRASOUND TIME: 12.5 % of 1 minutes, 14 seconds.  CDE 9.3   SURGEON:  Wyonia Hough, MD   ANESTHESIA:  Topical with tetracaine drops and 2% Xylocaine jelly.   COMPLICATIONS:  None.   DESCRIPTION OF PROCEDURE:  The patient was identified in the holding room and transported to the operating suite and placed in the supine position under the operating microscope.  The left eye was identified as the operative eye, and it was prepped and draped in the usual sterile ophthalmic fashion.    A clear-corneal paracentesis incision was made at the 1:30 position.  The anterior chamber was filled with Viscoat.  A 2.4 millimeter near clear corneal incision was then made at the 10:30 position.  A cystotome and capsulorrhexis forceps were then used to make a curvilinear capsulorrhexis.  Hydrodissection and hydrodelineation were then performed using balanced salt solution.   Phacoemulsification was then used in stop and chop fashion to remove the lens, nucleus and epinucleus.  The remaining cortex was aspirated using the irrigation and aspiration handpiece.  Provisc viscoelastic was then placed into the capsular bag to distend it for lens placement.  The Verion digital marker was used to align the implant at the intended axis.   A Cefuroxime 0.1 ml of a 10mg /ml solution was injected into the anterior chamber for a dose of 1 mg of intracameral antibiotic at the completion  of the case.  diopter lens was then injected into the capsular bag.  It was rotated clockwise until the axis marks on the lens were approximately 15 degrees in the counterclockwise direction to the intended alignment.  The viscoelastic was aspirated from the eye using the irrigation aspiration handpiece.  Then, a Koch spatula through the sideport incision was used to rotate the lens in a clockwise direction until the axis markings of the intraocular lens were lined up with the Verion alignment.  Balanced salt solution was then used to hydrate the wounds. Cefuroxime 0.1 ml of a 10mg /ml solution was injected into the anterior chamber for a dose of 1 mg of intracameral antibiotic at the completion of the case.    The eye was noted to have a physiologic pressure and there was no wound leak noted.   Timolol and Brimonidine drops were applied to the eye.  The patient was taken to the recovery room in stable condition having had no complications of anesthesia or surgery.  Sai Moura 07/27/2015, 10:37 AM

## 2015-07-27 NOTE — Anesthesia Procedure Notes (Signed)
Procedure Name: MAC Performed by: Kynslei Art Pre-anesthesia Checklist: Patient identified, Emergency Drugs available, Suction available, Timeout performed and Patient being monitored Patient Re-evaluated:Patient Re-evaluated prior to inductionOxygen Delivery Method: Nasal cannula Placement Confirmation: positive ETCO2     

## 2015-07-28 ENCOUNTER — Encounter: Payer: Self-pay | Admitting: Ophthalmology

## 2015-08-16 DIAGNOSIS — H2511 Age-related nuclear cataract, right eye: Secondary | ICD-10-CM | POA: Diagnosis not present

## 2015-08-18 ENCOUNTER — Encounter: Payer: Self-pay | Admitting: *Deleted

## 2015-08-22 NOTE — Discharge Instructions (Signed)

## 2015-08-24 ENCOUNTER — Encounter
Admission: RE | Disposition: A | Discharge status: Home / Self Care | Payer: Self-pay | Source: Ambulatory Visit | Attending: Ophthalmology

## 2015-08-24 ENCOUNTER — Ambulatory Visit: Payer: Medicare Other | Admitting: Anesthesiology

## 2015-08-24 ENCOUNTER — Ambulatory Visit
Admission: RE | Admit: 2015-08-24 | Discharge: 2015-08-24 | Disposition: A | Discharge status: Home / Self Care | Payer: Medicare Other | Source: Ambulatory Visit | Attending: Ophthalmology | Admitting: Ophthalmology

## 2015-08-24 DIAGNOSIS — K219 Gastro-esophageal reflux disease without esophagitis: Secondary | ICD-10-CM | POA: Insufficient documentation

## 2015-08-24 DIAGNOSIS — Z87891 Personal history of nicotine dependence: Secondary | ICD-10-CM | POA: Insufficient documentation

## 2015-08-24 DIAGNOSIS — Z85828 Personal history of other malignant neoplasm of skin: Secondary | ICD-10-CM | POA: Diagnosis not present

## 2015-08-24 DIAGNOSIS — H2511 Age-related nuclear cataract, right eye: Secondary | ICD-10-CM | POA: Diagnosis not present

## 2015-08-24 HISTORY — PX: CATARACT EXTRACTION W/PHACO: SHX586

## 2015-08-24 SURGERY — PHACOEMULSIFICATION, CATARACT, WITH IOL INSERTION
Anesthesia: Monitor Anesthesia Care | Laterality: Right | Wound class: Clean

## 2015-08-24 MED ORDER — FENTANYL CITRATE (PF) 100 MCG/2ML IJ SOLN
INTRAMUSCULAR | Status: DC | PRN
  Administered 2015-08-24: 100 ug via INTRAVENOUS

## 2015-08-24 MED ORDER — TIMOLOL MALEATE 0.5 % OP SOLN
OPHTHALMIC | Status: DC | PRN
  Administered 2015-08-24: 1 [drp] via OPHTHALMIC

## 2015-08-24 MED ORDER — TETRACAINE HCL 0.5 % OP SOLN
1.0000 [drp] | OPHTHALMIC | Status: DC | PRN
  Administered 2015-08-24: 1 [drp] via OPHTHALMIC

## 2015-08-24 MED ORDER — LIDOCAINE HCL (PF) 4 % IJ SOLN
INTRAOCULAR | Status: DC | PRN
  Administered 2015-08-24: 1 mL via OPHTHALMIC

## 2015-08-24 MED ORDER — BSS IO SOLN
INTRAOCULAR | Status: DC | PRN
  Administered 2015-08-24: 72 mL via OPHTHALMIC

## 2015-08-24 MED ORDER — LACTATED RINGERS IV SOLN: INTRAVENOUS | Status: DC

## 2015-08-24 MED ORDER — POVIDONE-IODINE 5 % OP SOLN
1.0000 "application " | OPHTHALMIC | Status: DC | PRN
  Administered 2015-08-24: 1 via OPHTHALMIC

## 2015-08-24 MED ORDER — BRIMONIDINE TARTRATE 0.2 % OP SOLN
OPHTHALMIC | Status: DC | PRN
  Administered 2015-08-24: 1 [drp] via OPHTHALMIC

## 2015-08-24 MED ORDER — NA HYALUR & NA CHOND-NA HYALUR 0.4-0.35 ML IO KIT
PACK | INTRAOCULAR | Status: DC | PRN
  Administered 2015-08-24: 1 mL via INTRAOCULAR

## 2015-08-24 MED ORDER — ARMC OPHTHALMIC DILATING GEL
1.0000 "application " | OPHTHALMIC | Status: DC | PRN
  Administered 2015-08-24 (×2): 1 via OPHTHALMIC

## 2015-08-24 MED ORDER — MIDAZOLAM HCL 2 MG/2ML IJ SOLN
INTRAMUSCULAR | Status: DC | PRN
  Administered 2015-08-24: 2 mg via INTRAVENOUS

## 2015-08-24 MED ORDER — CEFUROXIME OPHTHALMIC INJECTION 1 MG/0.1 ML
INJECTION | OPHTHALMIC | Status: DC | PRN
  Administered 2015-08-24: 0.1 mL via INTRACAMERAL

## 2015-08-24 SURGICAL SUPPLY — 28 items
CANNULA ANT/CHMB 27GA (MISCELLANEOUS) ×3 IMPLANT
CARTRIDGE ABBOTT (MISCELLANEOUS) ×3 IMPLANT
GLOVE SURG LX 7.5 STRW (GLOVE) ×2
GLOVE SURG LX STRL 7.5 STRW (GLOVE) ×1 IMPLANT
GLOVE SURG TRIUMPH 8.0 PF LTX (GLOVE) ×3 IMPLANT
GOWN STRL REUS W/ TWL LRG LVL3 (GOWN DISPOSABLE) ×2 IMPLANT
GOWN STRL REUS W/TWL LRG LVL3 (GOWN DISPOSABLE) ×4
LENS IOL TECNIS TRC I 150 23.0 (Intraocular Lens) ×1 IMPLANT
LENS IOL TORIC 150 23.0 (Intraocular Lens) ×1 IMPLANT
LENS IOL TORIC 23.0 (Intraocular Lens) ×2 IMPLANT
MARKER SKIN DUAL TIP RULER LAB (MISCELLANEOUS) ×3 IMPLANT
NDL RETROBULBAR .5 NSTRL (NEEDLE) IMPLANT
NEEDLE FILTER BLUNT 18X 1/2SAF (NEEDLE) ×2
NEEDLE FILTER BLUNT 18X1 1/2 (NEEDLE) ×1 IMPLANT
PACK CATARACT BRASINGTON (MISCELLANEOUS) ×3 IMPLANT
PACK EYE AFTER SURG (MISCELLANEOUS) ×3 IMPLANT
PACK OPTHALMIC (MISCELLANEOUS) ×3 IMPLANT
RING MALYGIN 7.0 (MISCELLANEOUS) IMPLANT
SUT ETHILON 10-0 CS-B-6CS-B-6 (SUTURE)
SUT VICRYL  9 0 (SUTURE)
SUT VICRYL 9 0 (SUTURE) IMPLANT
SUTURE EHLN 10-0 CS-B-6CS-B-6 (SUTURE) IMPLANT
SYR 3ML LL SCALE MARK (SYRINGE) ×3 IMPLANT
SYR 5ML LL (SYRINGE) IMPLANT
SYR TB 1ML LUER SLIP (SYRINGE) ×3 IMPLANT
WATER STERILE IRR 250ML POUR (IV SOLUTION) ×3 IMPLANT
WATER STERILE IRR 500ML POUR (IV SOLUTION) IMPLANT
WIPE NON LINTING 3.25X3.25 (MISCELLANEOUS) ×3 IMPLANT

## 2015-08-24 NOTE — Anesthesia Preprocedure Evaluation (Signed)
Anesthesia Evaluation  Patient identified by MRN, date of birth, ID band  Reviewed: Allergy & Precautions, H&P , NPO status , Patient's Chart, lab work & pertinent test results  Airway Mallampati: II  TM Distance: >3 FB Neck ROM: full    Dental no notable dental hx.    Pulmonary former smoker,    Pulmonary exam normal        Cardiovascular  Rhythm:regular Rate:Normal     Neuro/Psych    GI/Hepatic GERD  ,  Endo/Other    Renal/GU      Musculoskeletal   Abdominal   Peds  Hematology   Anesthesia Other Findings   Reproductive/Obstetrics                             Anesthesia Physical Anesthesia Plan  ASA: II  Anesthesia Plan: MAC   Post-op Pain Management:    Induction:   Airway Management Planned:   Additional Equipment:   Intra-op Plan:   Post-operative Plan:   Informed Consent: I have reviewed the patients History and Physical, chart, labs and discussed the procedure including the risks, benefits and alternatives for the proposed anesthesia with the patient or authorized representative who has indicated his/her understanding and acceptance.     Plan Discussed with: CRNA  Anesthesia Plan Comments:         Anesthesia Quick Evaluation

## 2015-08-24 NOTE — H&P (Signed)
  The History and Physical notes are on paper, have been signed, and are to be scanned. The patient remains stable and unchanged from the H&P.   Previous H&P reviewed, patient examined, and there are no changes.  Adam Juarez 08/24/2015 9:42 AM

## 2015-08-24 NOTE — Transfer of Care (Signed)
Immediate Anesthesia Transfer of Care Note  Patient: Adam Juarez  Procedure(s) Performed: Procedure(s) with comments: CATARACT EXTRACTION PHACO AND INTRAOCULAR LENS PLACEMENT ([place]) (Right) - TORIC  Patient Location: PACU  Anesthesia Type: MAC  Level of Consciousness: awake, alert  and patient cooperative  Airway and Oxygen Therapy: Patient Spontanous Breathing and Patient connected to supplemental oxygen  Post-op Assessment: Post-op Vital signs reviewed, Patient's Cardiovascular Status Stable, Respiratory Function Stable, Patent Airway and No signs of Nausea or vomiting  Post-op Vital Signs: Reviewed and stable  Complications: No apparent anesthesia complications

## 2015-08-24 NOTE — Anesthesia Procedure Notes (Signed)
Procedure Name: MAC Performed by: Deanda Ruddell Pre-anesthesia Checklist: Patient identified, Emergency Drugs available, Suction available, Timeout performed and Patient being monitored Patient Re-evaluated:Patient Re-evaluated prior to inductionOxygen Delivery Method: Nasal cannula Placement Confirmation: positive ETCO2     

## 2015-08-24 NOTE — Op Note (Signed)
LOCATION:  [place]   PREOPERATIVE DIAGNOSIS:  Nuclear sclerotic cataract of the right eye.  H25.11   POSTOPERATIVE DIAGNOSIS:  Nuclear sclerotic cataract of the right eye.   PROCEDURE:  Phacoemulsification with Toric posterior chamber intraocular lens placement of the right eye.   LENS:   Implant Name Type Inv. Item Serial No. Manufacturer Lot No. LRB No. Used  tecnis toric aspheric IOL Intraocular Lens   [id number] Wynetta Emery AND JOH[employer]ON   Right 1     [id number] 23.0 D Toric intraocular lens with 1.50 diopters of cylindrical power with axis orientation at 17 degrees.   ULTRASOUND TIME: 11 % of 1 minutes, 49 seconds.  CDE 12.0   SURGEON:  Wyonia Hough, MD   ANESTHESIA: Topical with tetracaine drops and 2% Xylocaine jelly, augmented with 1% preservative-free intracameral lidocaine. .   COMPLICATIONS:  None.   DESCRIPTION OF PROCEDURE:  The patient was identified in the holding room and transported to the operating suite and placed in the supine position under the operating microscope.  The right eye was identified as the operative eye, and it was prepped and draped in the usual sterile ophthalmic fashion.    A clear-corneal paracentesis incision was made at the 12:00 position.  0.5 ml of preservative-free 1% lidocaine was injected into the anterior chamber. The anterior chamber was filled with Viscoat.  A 2.4 millimeter near clear corneal incision was then made at the 9:00 position.  A cystotome and capsulorrhexis forceps were then used to make a curvilinear capsulorrhexis.  Hydrodissection and hydrodelineation were then performed using balanced salt solution.   Phacoemulsification was then used in stop and chop fashion to remove the lens, nucleus and epinucleus.  The remaining cortex was aspirated using the irrigation and aspiration handpiece.  Provisc viscoelastic was then placed into the capsular bag to distend it for lens placement.  The Verion digital marker was used  to align the implant at the intended axis.   A Toric lens was then injected into the capsular bag.  It was rotated clockwise until the axis marks on the lens were approximately 15 degrees in the counterclockwise direction to the intended alignment.  The viscoelastic was aspirated from the eye using the irrigation aspiration handpiece.  Then, a Koch spatula through the sideport incision was used to rotate the lens in a clockwise direction until the axis markings of the intraocular lens were lined up with the Verion alignment.  Balanced salt solution was then used to hydrate the wounds. Cefuroxime 0.1 ml of a 10mg /ml solution was injected into the anterior chamber for a dose of 1 mg of intracameral antibiotic at the completion of the case.    The eye was noted to have a physiologic pressure and there was no wound leak noted.   Timolol and Brimonidine drops were applied to the eye.  The patient was taken to the recovery room in stable condition having had no complications of anesthesia or surgery.  Darrell Hauk 08/24/2015, 10:15 AM

## 2015-08-24 NOTE — Anesthesia Postprocedure Evaluation (Signed)
Anesthesia Post Note  Patient: Adam Juarez  Procedure(s) Performed: Procedure(s) (LRB): CATARACT EXTRACTION PHACO AND INTRAOCULAR LENS PLACEMENT (IOC) (Right)  Patient location during evaluation: PACU Anesthesia Type: MAC Level of consciousness: awake and alert and oriented Pain management: satisfactory to patient Vital Signs Assessment: post-procedure vital signs reviewed and stable Respiratory status: spontaneous breathing, nonlabored ventilation and respiratory function stable Cardiovascular status: blood pressure returned to baseline and stable Postop Assessment: Adequate PO intake and No signs of nausea or vomiting Anesthetic complications: no    Raliegh Ip

## 2015-08-25 ENCOUNTER — Encounter: Payer: Self-pay | Admitting: Ophthalmology

## 2015-08-31 ENCOUNTER — Ambulatory Visit: Payer: Medicare Other | Admitting: Cardiology

## 2015-09-13 DIAGNOSIS — L570 Actinic keratosis: Secondary | ICD-10-CM | POA: Diagnosis not present

## 2015-09-15 ENCOUNTER — Ambulatory Visit: Payer: Medicare Other | Admitting: Cardiovascular Disease

## 2015-09-16 ENCOUNTER — Ambulatory Visit (INDEPENDENT_AMBULATORY_CARE_PROVIDER_SITE_OTHER): Payer: Medicare Other | Admitting: Cardiovascular Disease

## 2015-09-16 ENCOUNTER — Encounter: Payer: Self-pay | Admitting: Cardiovascular Disease

## 2015-09-16 VITALS — BP 120/78 | HR 67 | Ht 71.0 in | Wt 205.8 lb

## 2015-09-16 DIAGNOSIS — R000 Tachycardia, unspecified: Secondary | ICD-10-CM | POA: Diagnosis not present

## 2015-09-16 DIAGNOSIS — F17200 Nicotine dependence, unspecified, uncomplicated: Secondary | ICD-10-CM

## 2015-09-16 DIAGNOSIS — I471 Supraventricular tachycardia, unspecified: Secondary | ICD-10-CM | POA: Insufficient documentation

## 2015-09-16 DIAGNOSIS — I493 Ventricular premature depolarization: Secondary | ICD-10-CM

## 2015-09-16 DIAGNOSIS — Z0000 Encounter for general adult medical examination without abnormal findings: Secondary | ICD-10-CM

## 2015-09-16 DIAGNOSIS — K219 Gastro-esophageal reflux disease without esophagitis: Secondary | ICD-10-CM

## 2015-09-16 DIAGNOSIS — I480 Paroxysmal atrial fibrillation: Secondary | ICD-10-CM | POA: Diagnosis not present

## 2015-09-16 DIAGNOSIS — Z720 Tobacco use: Secondary | ICD-10-CM | POA: Diagnosis not present

## 2015-09-16 NOTE — Assessment & Plan Note (Signed)
He had 12,000 PVCs on prior Holter monitor. No PVCs noted on his EKG today

## 2015-09-16 NOTE — Assessment & Plan Note (Signed)
Long history of smoking, quit several years ago Likely has underlying COPD, as well as PAD. Unable to exclude coronary disease. For this reason we did suggest a CT coronary calcium score if he wanted a routine screening study. Negative stress test 2013

## 2015-09-16 NOTE — Assessment & Plan Note (Signed)
Previous episodes noted on prior Holter monitor in 2013, short runs His most recent rhythms may be secondary to SVT though unable to exclude atrial fibrillation, atrial flutter

## 2015-09-16 NOTE — Patient Instructions (Signed)
You are doing well. No medication changes were made.  Please call if you would like a  30 day monitor for tachycardia  Research CT coronary calcium score to look for coronary disease  Please call us if you have new issues that need to be addressed before your next appt.

## 2015-09-16 NOTE — Assessment & Plan Note (Addendum)
Recommended a 30 day monitor for arrhythmia Suggested he consider CT coronary calcium scoring for coronary risk stratification High likelihood of COPD, PAD, coronary disease given long smoking history  Cholesterol is at goal on no medication, nondiabetic   Total encounter time more than 45 minutes  Greater than 50% was spent in counseling and coordination of care with the patient

## 2015-09-16 NOTE — Assessment & Plan Note (Signed)
Symptoms stable on PPI

## 2015-09-16 NOTE — Assessment & Plan Note (Signed)
Long discussion with him today concerning atrial fibrillation as well as other rhythms. He has documented atrial fibrillation in 2015. It is certainly not clear from his phone pulse recordings and wrist recordings what kind of rhythm he is having.. We have explained this to him in detail and the fact that we need to characterize his rhythm/arrhythmia. 30 day monitor has been offered, and he has expressed interest though he will call us when he would like to have this placed . One may be offered and activated  through our office. We did explain that his he has paroxysmal atrial fibrillation, he would likely benefit from further evaluation, possibly even medications including anticoagulation.  Unable to exclude SVT as this was seen on prior Holter. Arrhythmias range and speed from 120 up to 180 bpm. We have also offered a pill in the pocket approach for his episodes which could include propranolol or diltiazem. He was not interested at this time

## 2015-09-16 NOTE — Progress Notes (Signed)
Patient ID: Adam Juarez, male    DOB: 12-Jan-1941, 75 y.o.   MRN: [id number]  HPI Comments: Adam Juarez is a 75 year old gentleman with history of paroxysmal atrial fibrillation, prior event monitor August 2013 showing frequent PVCs, frequent short runs of supraventricular tachycardia, stress test at that time showing no ischemia September 2013, who presents by referral from Dr. Netty Starring for consultation concerning his tachycardia.  He has been using his Jeannie Done watch to monitor his pulse rate and appreciates episodes of tachycardia. Through April he had tachycardia on April 3, April 7, April 8, April 14, April 27. Typically he tries to convert back to a normal rhythm by doing some deep breathing exercises, following an algorithm that his wife found on the Internet.  Numerous episodes of tachycardia in March 2017. Prior to that, no tachycardia for several months, last in November 2016. He does appreciate these episodes but does not think they are serious. Tries to take magnesium daily in an effort to suppress his arrhythmia. Wife read this might be helpful on the Internet  He was admitted to the hospital for atrial fibrillation 08/08/2013, notes indicating he converted to normal sinus rhythm through his hospital course. He was not discharged on medications as he declined beta blockers  Reports he is active, Prior smoking history for 35-40 years Cholesterol typically well controlled, no diabetes  EKG on today's visit shows normal sinus rhythm with rate 67 bpm, no significant ST or T-wave changes  Prior echocardiogram reviewed with him in detail from August was 7 2013 showing normal LV function, mild LVH, a stock dysfunction, aortic valve sclerosis, normal right heart pressures  Holter monitor report August 2013 detailing 12,000 PVCs, 11% of total beat count. 55 supraventricular ectopies, no mention of the longest run   Allergies  Allergen Reactions  . Other Anaphylaxis and Hives    BEE  STINGS    Current Outpatient Prescriptions on File Prior to Visit  Medication Sig Dispense Refill  . Ascorbic Acid (VITAMIN C PO) Take by mouth daily.    . Coenzyme Q10 (COQ10) 200 MG CAPS Take 200 mg by mouth daily.    . Cyanocobalamin (VITAMIN B 12 PO) Take 1 tablet by mouth daily.    . Magnesium 500 MG CAPS Take 500 mg by mouth daily.    . Misc Natural Products (GLUCOSAMINE CHONDROITIN ADV PO) Take 1 tablet by mouth daily.    . Multiple Vitamins-Minerals (PRESERVISION AREDS 2 PO) Take by mouth 2 (two) times daily.    . Omega-3 Fatty Acids (FISH OIL PO) Take by mouth every other day.    Marland Kitchen omeprazole (PRILOSEC) 20 MG capsule Take 20 mg by mouth daily.    . polyvinyl alcohol (LIQUIFILM TEARS) 1.4 % ophthalmic solution Place 1 drop into both eyes as needed for dry eyes.    . Probiotic Product (PROBIOTIC DAILY PO) Take by mouth.    . TURMERIC PO Take by mouth every other day. Reported on 74/04/2016     No current facility-administered medications on file prior to visit.    Past Medical History  Diagnosis Date  . GERD (gastroesophageal reflux disease)   . Arthritis     fingers  . PSVT (paroxysmal supraventricular tachycardia) ([place]) 2009    followed by PCP.  controlled with "breathing process"  . Dysrhythmia 09/2013    Hx. a-fib x 1 episode patient states he "auto corredt" seen at [facility]  . Renal stone 9/08, 6/15  . Wears dentures     full  upper  . Headache     poor posture - none recently  . Cancer [facility]) 1991, 2011    skin cancer     Past Surgical History  Procedure Laterality Date  . Vasectomy    . Skin cancer excision    . Kidney stone surgery  9/08, 6/15    lithotripsy  . Hemorroidectomy  2010    banding  . Cataract extraction w/phaco Left 07/27/2015    Procedure: CATARACT EXTRACTION PHACO AND INTRAOCULAR LENS PLACEMENT (IOC);  Surgeon: Leandrew Koyanagi, MD;  Location: [place];  Service: Ophthalmology;  Laterality: Left;  TORIC  . Cataract  extraction w/phaco Right 08/24/2015    Procedure: CATARACT EXTRACTION PHACO AND INTRAOCULAR LENS PLACEMENT (IOC);  Surgeon: Leandrew Koyanagi, MD;  Location: [place];  Service: Ophthalmology;  Laterality: Right;  TORIC  . Cardiac catheterization  2008    [place], [place]    Social History  reports that he quit smoking about 18 years ago. His smoking use included Cigarettes. He has a 19 pack-year smoking history. He has never used smokeless tobacco. He reports that he does not drink alcohol or use illicit drugs.  Family History family history includes AAA (abdominal aortic aneurysm) in his father.   Review of Systems  Constitutional: Negative.   Respiratory: Negative.   Cardiovascular: Positive for palpitations.       Tachycardia  Gastrointestinal: Negative.   Musculoskeletal: Negative.   Neurological: Negative.   Hematological: Negative.   Psychiatric/Behavioral: Negative.   All other systems reviewed and are negative.   BP 120/78 mmHg  Pulse 67  Ht 5\' 11"  (1.803 m)  Wt 205 lb 12 oz (93.328 kg)  BMI 28.71 kg/m2  Physical Exam  Constitutional: He is oriented to person, place, and time. He appears well-developed and well-nourished.  HENT:  Head: Normocephalic.  Nose: Nose normal.  Mouth/Throat: Oropharynx is clear and moist.  Eyes: Conjunctivae are normal. Pupils are equal, round, and reactive to light.  Neck: Normal range of motion. Neck supple. No JVD present.  Cardiovascular: Normal rate, regular rhythm, normal heart sounds and intact distal pulses.  Exam reveals no gallop and no friction rub.   No murmur heard. Pulmonary/Chest: Effort normal and breath sounds normal. No respiratory distress. He has no wheezes. He has no rales. He exhibits no tenderness.  Abdominal: Soft. Bowel sounds are normal. He exhibits no distension. There is no tenderness.  Musculoskeletal: Normal range of motion. He exhibits no edema or tenderness.  Lymphadenopathy:    He has no  cervical adenopathy.  Neurological: He is alert and oriented to person, place, and time. Coordination normal.  Skin: Skin is warm and dry. No rash noted. No erythema.  Psychiatric: He has a normal mood and affect. His behavior is normal. Judgment and thought content normal.

## 2015-10-03 ENCOUNTER — Ambulatory Visit: Payer: Medicare Other | Admitting: Cardiovascular Disease

## 2015-10-14 DIAGNOSIS — L562 Photocontact dermatitis [berloque dermatitis]: Secondary | ICD-10-CM | POA: Diagnosis not present

## 2015-11-18 ENCOUNTER — Ambulatory Visit: Payer: Medicare Other | Admitting: Family Medicine

## 2015-11-24 ENCOUNTER — Encounter: Payer: Self-pay | Admitting: Family Medicine

## 2015-11-24 ENCOUNTER — Ambulatory Visit (INDEPENDENT_AMBULATORY_CARE_PROVIDER_SITE_OTHER): Payer: Medicare Other | Admitting: Family Medicine

## 2015-11-24 VITALS — BP 140/80 | HR 70 | Temp 97.9°F | Ht 71.0 in | Wt 205.8 lb

## 2015-11-24 DIAGNOSIS — M546 Pain in thoracic spine: Secondary | ICD-10-CM

## 2015-11-24 DIAGNOSIS — E785 Hyperlipidemia, unspecified: Secondary | ICD-10-CM | POA: Diagnosis not present

## 2015-11-24 DIAGNOSIS — I480 Paroxysmal atrial fibrillation: Secondary | ICD-10-CM | POA: Diagnosis not present

## 2015-11-24 DIAGNOSIS — R5382 Chronic fatigue, unspecified: Secondary | ICD-10-CM

## 2015-11-24 MED ORDER — CYCLOBENZAPRINE HCL 5 MG PO TABS: 5.0000 mg | ORAL_TABLET | Freq: Three times a day (TID) | ORAL | Status: DC | PRN

## 2015-11-24 NOTE — Patient Instructions (Signed)
Take an aspirin daily (81 mg daily).  Consider the monitor.  Use the muscle relaxant as needed.  Follow up in 6 months to 1 year.  Consider the additional pneumonia vaccine.  Take care  Dr. Lacinda Axon

## 2015-11-24 NOTE — Progress Notes (Signed)
Pre visit review using our clinic review tool, if applicable. No additional management support is needed unless otherwise documented below in the visit note. 

## 2015-11-25 ENCOUNTER — Encounter: Payer: Self-pay | Admitting: Family Medicine

## 2015-11-25 NOTE — Progress Notes (Signed)
Subjective:  Patient ID: Adam Juarez, male    DOB: [birth year]  Age: 75 y.o. MRN: [id number]  CC: Establish care, Back pain, neck pain, fatigue  HPI Adam Juarez is a 75 y.o. male presents to the clinic today to establish care. Issues and concerns are below.  Paroxysmal SVT, ? Afib  History of SVT and A. fib.  Patient states that he has increased heart rate/palpitations a few to several times a month.  He states it is controlled with breathing exercises.  He is followed by cardiology. Dr. Rockey Situ has recommended a 30 day monitor but he has declined.  Hyperlipidemia  Elevated triglycerides.  Has been fairly well controlled without medication.  Back pain  X 2 months.  Located around the right scapula.  States that is has slowly been worsening.  Has used tylenol and ice/cold without improvement.  No recent fall/trauma/injury.   No change in activity.  Worse with certain movements.  Neck pain  Has had recent neck pain  Brief/shooting.  Lasts for 30 secs then resolves.  No known exacerbating or relieving factors. No reports of upper extremity numbness/tingling.  Fatigue  Patient reports long-standing fatigue.  He states that this is been going on for years (~ past 3 years).  He describes it as a decreased energy level.   He states that he's fairly inactive and does not seem to have much drive  He states that he likes to stay inside and sit in his recliner and watch westerns.  No other associated symptoms.  No chest pain/SOB. Does report palpitations (see above).  PMH, Surgical Hx, Family Hx, Social History reviewed and updated as below.  Past Medical History  Diagnosis Date  . GERD (gastroesophageal reflux disease)   . Arthritis     fingers  . PSVT (paroxysmal supraventricular tachycardia) ([place]) 2009    Controlled with "breathing process"  . Dysrhythmia 09/2013    Hx. a-fib x 1 episode patient states he "auto corrected" seen at [facility]    . Renal stone 9/08, 6/15  . Wears dentures     full upper  . Headache     poor posture - none recently  . Cancer ([place]) 1991, 2011    skin cancer   . Chicken pox    Past Surgical History  Procedure Laterality Date  . Vasectomy    . Skin cancer excision    . Kidney stone surgery  9/08, 6/15    lithotripsy  . Hemorroidectomy  2010    banding  . Cataract extraction w/phaco Left 07/27/2015    Procedure: CATARACT EXTRACTION PHACO AND INTRAOCULAR LENS PLACEMENT (IOC);  Surgeon: Leandrew Koyanagi, MD;  Location: [place];  Service: Ophthalmology;  Laterality: Left;  TORIC  . Cataract extraction w/phaco Right 08/24/2015    Procedure: CATARACT EXTRACTION PHACO AND INTRAOCULAR LENS PLACEMENT (IOC);  Surgeon: Leandrew Koyanagi, MD;  Location: [place];  Service: Ophthalmology;  Laterality: Right;  TORIC  . Cardiac catheterization  2008    [place], [place]   Family History  Problem Relation Age of Onset  . AAA (abdominal aortic aneurysm) Father   . Parkinson's disease Mother   . Arthritis Mother    Social History  Substance Use Topics  . Smoking status: Former Smoker -- 0.50 packs/day for 38 years    Types: Cigarettes    Quit date: 07/13/1997  . Smokeless tobacco: Never Used  . Alcohol Use: No   Review of Systems  HENT: Positive for tinnitus.  Cardiovascular:       Fast/irregular heart beat.  Musculoskeletal: Positive for back pain and neck pain.  Neurological: Positive for headaches.  All other systems reviewed and are negative.  Objective:   Today's Vitals: BP 140/80 mmHg  Pulse 70  Temp(Src) 97.9 F (36.6 C) (Oral)  Ht 5\' 11"  (1.803 m)  Wt 205 lb 12 oz (93.328 kg)  BMI 28.71 kg/m2  SpO2 95%  Physical Exam  Constitutional: He is oriented to person, place, and time. He appears well-developed. No distress.  HENT:  Head: Normocephalic and atraumatic.  Mouth/Throat: Oropharynx is clear and moist.  Eyes: Conjunctivae are normal. No scleral  icterus.  Neck: Neck supple.  Cardiovascular: Normal rate and regular rhythm.   Pulmonary/Chest: Effort normal. He has no wheezes. He has no rales.  Abdominal: Soft. He exhibits no distension. There is no tenderness. There is no rebound and no guarding.  Musculoskeletal:  Normal ROM of the back and neck.  Mild tenderness to palpation around the R scapula.  Lymphadenopathy:    He has no cervical adenopathy.  Neurological: He is alert and oriented to person, place, and time.  Skin: Skin is warm and dry. No rash noted.  Psychiatric: He has a normal mood and affect.  Vitals reviewed.  Assessment & Plan:   Problem List Items Addressed This Visit    Paroxysmal atrial fibrillation ([place]) - Primary    Stable. Urged to consider 30 day monitor. Followed by cardiology.      Hyperlipidemia    On last lipid panel, triglycerides 304. LDL 83. ASCVD risk score nearly 25%. Advised to consider statin.      Thoracic back pain    New problem. MSK in etiology. Likely spasm component. Associated neck pain as well. Treating with PRN Flexeril.       Relevant Medications   cyclobenzaprine (FLEXERIL) 5 MG tablet   Chronic fatigue    New problem. Likely from inactivity/lack of exercise. ? Depression. Advised regular activity/exercise.         Outpatient Encounter Prescriptions as of 11/24/2015  Medication Sig  . Ascorbic Acid (VITAMIN C PO) Take by mouth daily.  . Coenzyme Q10 (COQ10) 200 MG CAPS Take 200 mg by mouth daily.  . Cyanocobalamin (VITAMIN B 12 PO) Take 1 tablet by mouth daily.  . Magnesium 500 MG CAPS Take 500 mg by mouth daily.  . Misc Natural Products (GLUCOSAMINE CHONDROITIN ADV PO) Take 1 tablet by mouth daily.  . Multiple Vitamins-Minerals (PRESERVISION AREDS 2 PO) Take by mouth 2 (two) times daily.  . Omega-3 Fatty Acids (FISH OIL PO) Take by mouth every other day.  Marland Kitchen omeprazole (PRILOSEC) 20 MG capsule Take 20 mg by mouth daily.  . polyvinyl alcohol (LIQUIFILM TEARS)  1.4 % ophthalmic solution Place 1 drop into both eyes as needed for dry eyes.  . Probiotic Product (PROBIOTIC DAILY PO) Take by mouth.  . TURMERIC PO Take by mouth every other day. Reported on 08/24/2015  . cyclobenzaprine (FLEXERIL) 5 MG tablet Take 1 tablet (5 mg total) by mouth 3 (three) times daily as needed for muscle spasms.   No facility-administered encounter medications on file as of 11/24/2015.    Follow-up: 6 months  [place] DO [facility]

## 2015-11-27 DIAGNOSIS — R5382 Chronic fatigue, unspecified: Secondary | ICD-10-CM | POA: Insufficient documentation

## 2015-11-27 DIAGNOSIS — M549 Dorsalgia, unspecified: Secondary | ICD-10-CM | POA: Insufficient documentation

## 2015-11-27 DIAGNOSIS — M546 Pain in thoracic spine: Secondary | ICD-10-CM

## 2015-11-27 DIAGNOSIS — E785 Hyperlipidemia, unspecified: Secondary | ICD-10-CM

## 2015-11-27 DIAGNOSIS — E782 Mixed hyperlipidemia: Secondary | ICD-10-CM | POA: Insufficient documentation

## 2015-11-27 DIAGNOSIS — G8929 Other chronic pain: Secondary | ICD-10-CM | POA: Insufficient documentation

## 2015-11-27 DIAGNOSIS — R5383 Other fatigue: Secondary | ICD-10-CM | POA: Insufficient documentation

## 2015-11-27 NOTE — Assessment & Plan Note (Signed)
Stable. Urged to consider 30 day monitor. Followed by cardiology.

## 2015-11-27 NOTE — Assessment & Plan Note (Signed)
On last lipid panel, triglycerides 304. LDL 83. ASCVD risk score nearly 25%. Advised to consider statin.

## 2015-11-27 NOTE — Assessment & Plan Note (Signed)
New problem. Likely from inactivity/lack of exercise. ? Depression. Advised regular activity/exercise.

## 2015-11-27 NOTE — Assessment & Plan Note (Signed)
New problem. MSK in etiology. Likely spasm component. Associated neck pain as well. Treating with PRN Flexeril.

## 2015-12-19 ENCOUNTER — Telehealth: Payer: Self-pay | Admitting: Cardiovascular Disease

## 2015-12-19 NOTE — Telephone Encounter (Signed)
Pt is calling, states he is ready for his 30 day monitor. Please call.

## 2015-12-20 ENCOUNTER — Other Ambulatory Visit: Payer: Self-pay

## 2015-12-20 DIAGNOSIS — I471 Supraventricular tachycardia: Secondary | ICD-10-CM

## 2015-12-20 NOTE — Telephone Encounter (Signed)
Monitor placed in [employer] for 30 day event monitor.

## 2015-12-21 ENCOUNTER — Encounter (INDEPENDENT_AMBULATORY_CARE_PROVIDER_SITE_OTHER): Payer: Medicare Other

## 2015-12-21 DIAGNOSIS — I471 Supraventricular tachycardia: Secondary | ICD-10-CM | POA: Diagnosis not present

## 2015-12-21 DIAGNOSIS — I493 Ventricular premature depolarization: Secondary | ICD-10-CM

## 2015-12-21 DIAGNOSIS — I470 Re-entry ventricular arrhythmia: Secondary | ICD-10-CM | POA: Diagnosis not present

## 2015-12-21 DIAGNOSIS — I480 Paroxysmal atrial fibrillation: Secondary | ICD-10-CM | POA: Diagnosis not present

## 2016-01-06 ENCOUNTER — Telehealth: Payer: Self-pay | Admitting: Family Medicine

## 2016-01-06 NOTE — Telephone Encounter (Signed)
Pt lvm requesting referral for PT. Please advise.

## 2016-01-07 ENCOUNTER — Other Ambulatory Visit: Payer: Self-pay | Admitting: Family Medicine

## 2016-01-07 DIAGNOSIS — M546 Pain in thoracic spine: Secondary | ICD-10-CM

## 2016-01-07 NOTE — Telephone Encounter (Signed)
Referral placed.

## 2016-01-23 ENCOUNTER — Other Ambulatory Visit: Payer: Self-pay

## 2016-01-23 DIAGNOSIS — I493 Ventricular premature depolarization: Secondary | ICD-10-CM

## 2016-01-23 DIAGNOSIS — I480 Paroxysmal atrial fibrillation: Secondary | ICD-10-CM

## 2016-01-23 DIAGNOSIS — I471 Supraventricular tachycardia: Secondary | ICD-10-CM

## 2016-01-25 ENCOUNTER — Ambulatory Visit: Payer: Medicare Other | Attending: Family Medicine

## 2016-01-25 DIAGNOSIS — M6281 Muscle weakness (generalized): Secondary | ICD-10-CM

## 2016-01-25 DIAGNOSIS — M546 Pain in thoracic spine: Secondary | ICD-10-CM | POA: Diagnosis not present

## 2016-01-25 DIAGNOSIS — M545 Low back pain: Secondary | ICD-10-CM | POA: Diagnosis not present

## 2016-01-25 NOTE — Therapy (Signed)
[facility] [address] [place], [address] Phone: [phone]   Fax:  [phone]  Physical Therapy Evaluation  Patient Details  Name: Adam Juarez MRN: [id number] Date of Birth: Sep 16, 1940 Referring Provider: Coral Spikes, DO  Encounter Date: 01/25/2016      PT End of Session - 01/25/16 1445    Visit Number 1   Number of Visits 13   Date for PT Re-Evaluation 03/08/16   Authorization Type 1   Authorization Time Period of 10   PT Start Time 1445   PT Stop Time 1547   PT Time Calculation (min) 62 min   Activity Tolerance Patient tolerated treatment well   Behavior During Therapy [facility] for tasks assessed/performed      Past Medical History:  Diagnosis Date  . Arthritis    fingers  . Cancer ([place]) 1991, 2011   skin cancer   . Chicken pox   . Dysrhythmia 09/2013   Hx. a-fib x 1 episode patient states he "auto corrected" seen at [facility]  . GERD (gastroesophageal reflux disease)   . Headache    poor posture - none recently  . PSVT (paroxysmal supraventricular tachycardia) ([place]) 2009   Controlled with "breathing process"  . Renal stone 9/08, 6/15  . Wears dentures    full upper    Past Surgical History:  Procedure Laterality Date  . CARDIAC CATHETERIZATION  [address], [place]  . CATARACT EXTRACTION W/PHACO Left 07/27/2015   Procedure: CATARACT EXTRACTION PHACO AND INTRAOCULAR LENS PLACEMENT (IOC);  Surgeon: Leandrew Koyanagi, MD;  Location: [place];  Service: Ophthalmology;  Laterality: Left;  TORIC  . CATARACT EXTRACTION W/PHACO Right 08/24/2015   Procedure: CATARACT EXTRACTION PHACO AND INTRAOCULAR LENS PLACEMENT (IOC);  Surgeon: Leandrew Koyanagi, MD;  Location: [place];  Service: Ophthalmology;  Laterality: Right;  TORIC  . HEMORROIDECTOMY  2010   banding  . KIDNEY STONE SURGERY  9/08, 6/15   lithotripsy  . SKIN CANCER EXCISION    . VASECTOMY       There were no vitals filed for this visit.       Subjective Assessment - 01/25/16 1451    Subjective R mid back: 2/10 current (pt sitting), 0/10 in R S/L; 6-7/10 at worst.    Pertinent History R thoracic back pain. Pt also states having lower back pain as well. Has had back pain for years. Pt states that he was in the [employer] for 20 years which involved physical stress.  Pt also states a completly torn R quad muscle and a torn L quadriceps muscle which may cause some hip discomfort.  Pt recently changed doctors and his current MD saw that he had back pain and suggested to try PT.  Had PT for his back 3 years ago which helped temporarily which invovled increasing mobility and core strengthening.  R thoracic pain began around March 2017, unknown method of injury, sudden onset. Denies bowel or bladder problems, denies saddle anesthesia. Pt adds that he tore his R quadriceps in 2002 (pt stepped onto a hole) and L quadriceps in 2004 (pt slipped on ice). Denies osteoporosis. Has hx of skin cancer.    Patient Stated Goals Improve mobility, less or no pain. "I'd like to hike with my wife."   Currently in Pain? Yes   Pain Score 2    Pain Location --  R thoracic and low back   Pain Orientation Left   Pain  Descriptors / Indicators Aching;Nagging   Pain Type Chronic pain   Pain Onset More than a month ago   Pain Frequency Occasional   Aggravating Factors  L S/L, leaning over, leaning back (limited trunk ROM); hiking   Pain Relieving Factors R S/L, medication (tylenol, ibuprofen)          [facility] PT Assessment - 01/25/16 1504      Assessment   Medical Diagnosis R sided thoracic back pain   Referring Provider Coral Spikes, DO   Onset Date/Surgical Date 07/13/15  R thoracic pain. Low back pain is chronic.   Prior Therapy Had prior PT for his back which helped.      Precautions   Precaution Comments No known precautions.      Restrictions   Other Position/Activity Restrictions No known  restrictions     Balance Screen   Has the patient fallen in the past 6 months No   Has the patient had a decrease in activity level because of a fear of falling?  No   Is the patient reluctant to leave their home because of a fear of falling?  No     Home Environment   Additional Comments Pt lives in a condominium with his wife, no stairs.      Prior Function   Vocation Retired   Biomedical scientist PLOF: better able to go on hikes with his wife, lift, perform handyman related activities.      Observation/Other Assessments   Modified Oswertry 30%     Posture/Postural Control   Posture Comments Bilateral foot pronation R > L, decreased bilateral knee extension, bilateral femoral IR and adduction. Slight R lower lumbar side bend to L4, more upright upper lumbar posture to around L1. L lower thoracic side bend to around T8, slight R thoracic side bend above around T8 area. Bilaterally protracted shoulders and neck.      AROM   Lumbar Flexion limited with bilateral lateral low back pain which eases with rest.  No thoracic pain.  Slight L trunk rotation   Lumbar Extension limited   Lumbar - Right Side [facility] with reproduction of R thoracic pain   Lumbar - Left Side [facility] with reproduction or R thoracic pain   Lumbar - Right Rotation WFL with reproduction of R thoracic back pain   Lumbar - Left Rotation [facility]     Strength   Right Hip Flexion 4/5   Right Hip Extension 4-/5   Right Hip ABduction 4/5   Left Hip Flexion 4+/5   Left Hip Extension 4-/5   Left Hip ABduction 4+/5   Right Knee Flexion 4/5   Right Knee Extension 4+/5   Left Knee Flexion 4+/5   Left Knee Extension 4+/5     Palpation   Palpation comment TTP with P to A pressure to around L2 and L1 TP. Decreased P to A mobility to thoracic and lumbar spine.      Ambulation/Gait   Gait Comments Decreased stance R LE, bilateral femoral IR and adduction during stance phase, decreased trunk rotation      Objectives   There-ex  Directed pt with R S/L L hip abduction 10x2   Reviewed and given as part of his HEP. Pt demonstrated and verbalized understanding.    Improved exercise technique, movement at target joints, use of target muscles after mod verbal, visual, tactile cues.  PT Education - 01/25/16 1911    Education provided Yes   Education Details ther-ex, HEP, plan of care   Person(s) Educated Patient   Methods Explanation;Demonstration;Tactile cues;Verbal cues;Handout   Comprehension Verbalized understanding;Returned demonstration             PT Long Term Goals - 01/25/16 1706      PT LONG TERM GOAL #1   Title Patient will have a decrease in back pain to 3/10 or less at worst to promote ability to perform functional tasks as well as to improve ability to go on hikes.    Baseline 6-7/10 at worst.    Time 6   Period Weeks   Status New     PT LONG TERM GOAL #2   Title Patient will improve bilateral hip strength by at least 1/2 MMT grade to improve femoral control with standing tasks, improve ability to go on hikes with less back pain.    Time 6   Period Weeks   Status New     PT LONG TERM GOAL #3   Title Patient will improve his Modified Oswestry Low Back Pain Disability Questionnaire score by at least 12% as a demonstration of improved function.    Baseline 30%   Time 6   Period Weeks   Status New               Plan - 01/25/16 1657    Clinical Impression Statement Patient is a 75 year old male who came to physical therapy secondary to R thoracic pain which began around March 2017 and chronic low back pain. He also presents with altered gait pattern and posture (with R mid to lower thoracic side bending), bilateral LE weakness, decreased lumbar and thoracic mobility, reproduction of R thoracic pain with R trunk and L trunk side bending, and R trunk rotation, and difficulty performing functional tasks such as  hiking, as well a with positions involving leaning over or back. Patient will benefit from skilled physical therapy intervention to address the aforementioned deficits.    Rehab Potential Good   Clinical Impairments Affecting Rehab Potential chronicity of condition   PT Frequency 2x / week   PT Duration 6 weeks   PT Treatment/Interventions Manual techniques;Therapeutic exercise;Therapeutic activities;Neuromuscular re-education;Patient/family education;Traction;Electrical Stimulation;Aquatic Therapy   PT Next Visit Plan posture, lumbar and thoracic mobility, hip strengthening, lumbopelvic control   Consulted and Agree with Plan of Care Patient      Patient will benefit from skilled therapeutic intervention in order to improve the following deficits and impairments:  Pain, Postural dysfunction, Improper body mechanics, Hypomobility, Difficulty walking, Decreased strength  Visit Diagnosis: Pain in thoracic spine - Plan: PT plan of care cert/re-cert  Bilateral low back pain, with sciatica presence unspecified - Plan: PT plan of care cert/re-cert  Muscle weakness (generalized) - Plan: PT plan of care cert/re-cert      G-Codes - [id number]    Functional Assessment Tool Used Modified Oswestry Low Back Pain Disability Questionnaire, patient interview, clinical presentation.    Functional Limitation Mobility: Walking and moving around   Mobility: Walking and Moving Around Current Status [phone]) At least 20 percent but less than 40 percent impaired, limited or restricted   Mobility: Walking and Moving Around Goal Status [phone]) At least 1 percent but less than 20 percent impaired, limited or restricted       Problem List Patient Active Problem List   Diagnosis Date Noted  . Hyperlipidemia 11/27/2015  . Thoracic back pain 11/27/2015  .  Fatigue 11/27/2015  . Chronic fatigue 11/27/2015  . SVT (supraventricular tachycardia) ([place]) 09/16/2015  . Paroxysmal atrial fibrillation ([place])  09/16/2015  . 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 Esophageal reflux 09/16/2015  . Preventative health care 09/16/2015     Thank you for your referral.  Joneen Boers PT, DPT  01/25/2016, 7:18 PM  [facility] [address], [place], [address] Phone: [phone]   Fax:  [phone]  Name: Adam Juarez MRN: [id number] Date of Birth: 08/30/1940

## 2016-01-25 NOTE — Patient Instructions (Signed)
   Abduction: Side Leg Lift (Eccentric) - Side-Lying    Lie on right side only. Lift top leg slightly higher than shoulder level. Keep top leg straight with body, toes pointing forward. __10_ reps per set, __3_ sets per day, _5__ days per week.  http://ecce.exer.us/62   Copyright  VHI. All rights reserved.

## 2016-01-30 ENCOUNTER — Ambulatory Visit: Payer: Medicare Other

## 2016-01-30 DIAGNOSIS — M545 Low back pain: Secondary | ICD-10-CM

## 2016-01-30 DIAGNOSIS — M6281 Muscle weakness (generalized): Secondary | ICD-10-CM | POA: Diagnosis not present

## 2016-01-30 DIAGNOSIS — M546 Pain in thoracic spine: Secondary | ICD-10-CM | POA: Diagnosis not present

## 2016-01-30 NOTE — Therapy (Signed)
[facility] [address] [place], [address] Phone: [phone]   Fax:  [phone]  Physical Therapy Treatment  Patient Details  Name: Adam Juarez MRN: [id number] Date of Birth: May 23, 1940 Referring Provider: Coral Spikes, DO  Encounter Date: 01/30/2016      PT End of Session - 01/30/16 1429    Visit Number 2   Number of Visits 13   Date for PT Re-Evaluation 03/08/16   Authorization Type 2   Authorization Time Period of 10   PT Start Time 1430   PT Stop Time 1517   PT Time Calculation (min) 47 min   Activity Tolerance Patient tolerated treatment well   Behavior During Therapy [facility] for tasks assessed/performed      Past Medical History:  Diagnosis Date  . Arthritis    fingers  . Cancer ([place]) 1991, 2011   skin cancer   . Chicken pox   . Dysrhythmia 09/2013   Hx. a-fib x 1 episode patient states he "auto corrected" seen at [facility]  . GERD (gastroesophageal reflux disease)   . Headache    poor posture - none recently  . PSVT (paroxysmal supraventricular tachycardia) ([place]) 2009   Controlled with "breathing process"  . Renal stone 9/08, 6/15  . Wears dentures    full upper    Past Surgical History:  Procedure Laterality Date  . CARDIAC CATHETERIZATION  [address], [place]  . CATARACT EXTRACTION W/PHACO Left 07/27/2015   Procedure: CATARACT EXTRACTION PHACO AND INTRAOCULAR LENS PLACEMENT (IOC);  Surgeon: Leandrew Koyanagi, MD;  Location: [place];  Service: Ophthalmology;  Laterality: Left;  TORIC  . CATARACT EXTRACTION W/PHACO Right 08/24/2015   Procedure: CATARACT EXTRACTION PHACO AND INTRAOCULAR LENS PLACEMENT (IOC);  Surgeon: Leandrew Koyanagi, MD;  Location: [place];  Service: Ophthalmology;  Laterality: Right;  TORIC  . HEMORROIDECTOMY  2010   banding  . KIDNEY STONE SURGERY  9/08, 6/15   lithotripsy  . SKIN CANCER EXCISION    . VASECTOMY       There were no vitals filed for this visit.      Subjective Assessment - 01/30/16 1432    Subjective Has been doing the exercise. Sometimes it feels good. Sometimes it doesn't. 0/10 thoracic back pain currently.    Pertinent History R thoracic back pain. Pt also states having lower back pain as well. Has had back pain for years. Pt states that he was in the [employer] for 20 years which involved physical stress.  Pt also states a completly torn R quad muscle and a torn L quadriceps muscle which may cause some hip discomfort.  Pt recently changed doctors and his current MD saw that he had back pain and suggested to try PT.  Had PT for his back 3 years ago which helped temporarily which invovled increasing mobility and core strengthening.  R thoracic pain began around March 2017, unknown method of injury, sudden onset. Denies bowel or bladder problems, denies saddle anesthesia. Pt adds that he tore his R quadriceps in 2002 (pt stepped onto a hole) and L quadriceps in 2004 (pt slipped on ice). Denies osteoporosis. Has hx of skin cancer.    Patient Stated Goals Improve mobility, less or no pain. "I'd like to hike with my wife."   Currently in Pain? No/denies   Pain Score 0-No pain   Pain Onset More than a month ago  PT Education - 01/30/16 1437    Education provided Yes   Education Details ther-ex, HEP   Person(s) Educated Patient   Methods Explanation;Demonstration;Tactile cues;Verbal cues;Handout   Comprehension Returned demonstration;Verbalized understanding     Objectives   There-ex  Directed pt with standing L shoulder adduction resisting red band 10x5 seconds  Then with green band 10x2 with 5 second holds  Standing bilateral shoulder extension resisting green band 10x2 with 5 second holds  S/L planks R side 5x 5 seconds. R shoulder discomfort  Supine bilateral shoulder flexion with posterior pelvic tilt 10x5 seconds for 2  sets to promote thoracic extension  R S/L L shoulder abduction 10x2 with 5 second holds each side.    Lower trap raise L UE at table 5x5 seconds. L shoulder discomfort which eases with rest.   Standing bilateral shoulder ER resisting yellow band with scapular retraction and chin tuck 10x2    Improved exercise technique, movement at target joints, use of target muscles after mod verbal, visual, tactile cues.      No complain of increased thoracic pain throughout session. Some low back discomfort with supine exercises which eased with posterior pelvic tilt and use of abdominal muscles. Slight R low back discomfort with S/L planks on R side which decreased with rest. More neutral thoracic posture observed with standing L shoulder adduction resisting green theraband exercise.           PT Long Term Goals - 01/25/16 1706      PT LONG TERM GOAL #1   Title Patient will have a decrease in back pain to 3/10 or less at worst to promote ability to perform functional tasks as well as to improve ability to go on hikes.    Baseline 6-7/10 at worst.    Time 6   Period Weeks   Status New     PT LONG TERM GOAL #2   Title Patient will improve bilateral hip strength by at least 1/2 MMT grade to improve femoral control with standing tasks, improve ability to go on hikes with less back pain.    Time 6   Period Weeks   Status New     PT LONG TERM GOAL #3   Title Patient will improve his Modified Oswestry Low Back Pain Disability Questionnaire score by at least 12% as a demonstration of improved function.    Baseline 30%   Time 6   Period Weeks   Status New               Plan - 01/30/16 1428    Clinical Impression Statement No complain of increased thoracic pain throughout session. Some low back discomfort with supine exercises which eased with posterior pelvic tilt and use of abdominal muscles. Slight R low back discomfort with S/L planks on R side which decreased with rest. More  neutral thoracic posture observed with standing L shoulder adduction resisting green theraband exercise.    Rehab Potential Good   Clinical Impairments Affecting Rehab Potential chronicity of condition   PT Frequency 2x / week   PT Duration 6 weeks   PT Treatment/Interventions Manual techniques;Therapeutic exercise;Therapeutic activities;Neuromuscular re-education;Patient/family education;Traction;Electrical Stimulation;Aquatic Therapy   PT Next Visit Plan posture, lumbar and thoracic mobility, hip strengthening, lumbopelvic control   Consulted and Agree with Plan of Care Patient      Patient will benefit from skilled therapeutic intervention in order to improve the following deficits and impairments:  Pain, Postural dysfunction, Improper body mechanics, Hypomobility, Difficulty walking, Decreased  strength  Visit Diagnosis: Pain in thoracic spine  Bilateral low back pain, with sciatica presence unspecified  Muscle weakness (generalized)     Problem List Patient Active Problem List   Diagnosis Date Noted  . Hyperlipidemia 11/27/2015  . Thoracic back pain 11/27/2015  . Fatigue 11/27/2015  . Chronic fatigue 11/27/2015  . SVT (supraventricular tachycardia) ([place]) 09/16/2015  . Paroxysmal atrial fibrillation ([place]) 09/16/2015  . 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 Esophageal reflux 09/16/2015  . Preventative health care 09/16/2015    Joneen Boers PT, DPT   01/30/2016, 3:30 PM  [facility] [address] [place], [address] Phone: [phone]   Fax:  [phone]  Name: Adam Juarez MRN: [id number] Date of Birth: 29-Jan-1941

## 2016-01-30 NOTE — Patient Instructions (Addendum)
    Strengthening: Resisted Adduction    Hold tubing in left hand, arm out. Pull arm toward opposite hip. Do not twist or rotate trunk. Hold for 5 seconds Repeat __10__ times per set. Do __3__ sets per session. Do ___1_ sessions per day.  http://orth.exer.us/834   Copyright  VHI. All rights reserved.    Abduction- Side-Lying     Lift arm overhead. Hold for 5 seconds.  _10__ reps per set, _3__ sets per day.  Perform for each side.   http://ecce.exer.us/166   Copyright  VHI. All rights reserved.

## 2016-02-01 ENCOUNTER — Ambulatory Visit: Payer: Medicare Other

## 2016-02-01 DIAGNOSIS — M546 Pain in thoracic spine: Secondary | ICD-10-CM | POA: Diagnosis not present

## 2016-02-01 DIAGNOSIS — M6281 Muscle weakness (generalized): Secondary | ICD-10-CM

## 2016-02-01 DIAGNOSIS — M545 Low back pain: Secondary | ICD-10-CM

## 2016-02-01 NOTE — Therapy (Signed)
[facility] [address] [place], [address] Phone: [phone]   Fax:  [phone]  Physical Therapy Treatment  Patient Details  Name: Adam Juarez MRN: [id number] Date of Birth: Apr 01, 1941 Referring Provider: Coral Spikes, DO  Encounter Date: 02/01/2016      PT End of Session - 02/01/16 1518    Visit Number 3   Number of Visits 13   Date for PT Re-Evaluation 03/08/16   Authorization Type 3   Authorization Time Period of 10   PT Start Time 1519   PT Stop Time 1600   PT Time Calculation (min) 41 min   Activity Tolerance Patient tolerated treatment well   Behavior During Therapy [facility] for tasks assessed/performed      Past Medical History:  Diagnosis Date  . Arthritis    fingers  . Cancer ([place]) 1991, 2011   skin cancer   . Chicken pox   . Dysrhythmia 09/2013   Hx. a-fib x 1 episode patient states he "auto corrected" seen at [facility]  . GERD (gastroesophageal reflux disease)   . Headache    poor posture - none recently  . PSVT (paroxysmal supraventricular tachycardia) ([place]) 2009   Controlled with "breathing process"  . Renal stone 9/08, 6/15  . Wears dentures    full upper    Past Surgical History:  Procedure Laterality Date  . CARDIAC CATHETERIZATION  [address], [place]  . CATARACT EXTRACTION W/PHACO Left 07/27/2015   Procedure: CATARACT EXTRACTION PHACO AND INTRAOCULAR LENS PLACEMENT (IOC);  Surgeon: Leandrew Koyanagi, MD;  Location: [facility];  Service: Ophthalmology;  Laterality: Left;  TORIC  . CATARACT EXTRACTION W/PHACO Right 08/24/2015   Procedure: CATARACT EXTRACTION PHACO AND INTRAOCULAR LENS PLACEMENT (IOC);  Surgeon: Leandrew Koyanagi, MD;  Location: [facility];  Service: Ophthalmology;  Laterality: Right;  TORIC  . HEMORROIDECTOMY  2010   banding  . KIDNEY STONE SURGERY  9/08, 6/15   lithotripsy  . SKIN CANCER EXCISION    . VASECTOMY       There were no vitals filed for this visit.      Subjective Assessment - 02/01/16 1521    Subjective Pt states having difficulty performing his exercises in bed due to GERD. Also has arrhythmia, does breathing exercises which helps. Mid back feels pretty good today and yesterday too. 1/10 mid back pain currently. 4/10 mid back pain yesterday.    Pertinent History R thoracic back pain. Pt also states having lower back pain as well. Has had back pain for years. Pt states that he was in the [employer] for 20 years which involved physical stress.  Pt also states a completly torn R quad muscle and a torn L quadriceps muscle which may cause some hip discomfort.  Pt recently changed doctors and his current MD saw that he had back pain and suggested to try PT.  Had PT for his back 3 years ago which helped temporarily which invovled increasing mobility and core strengthening.  R thoracic pain began around March 2017, unknown method of injury, sudden onset. Denies bowel or bladder problems, denies saddle anesthesia. Pt adds that he tore his R quadriceps in 2002 (pt stepped onto a hole) and L quadriceps in 2004 (pt slipped on ice). Denies osteoporosis. Has hx of skin cancer.    Patient Stated Goals Improve mobility, less or no pain. "I'd like to hike with my wife."   Currently in Pain? Yes  Pain Score 1    Pain Onset More than a month ago            [facility] PT Assessment - 02/01/16 1540      Observation/Other Assessments   Observations Long sit test suggests posterior nutation of R innominate.                              PT Education - 02/01/16 1526    Education provided Yes   Education Details ther-ex, HEP   Person(s) Educated Patient   Methods Explanation;Demonstration;Tactile cues;Verbal cues;Handout   Comprehension Verbalized understanding;Returned demonstration       Objectives  There-ex  Directed pt with standing L shoulder adduction resisting green band 10x5  seconds for 3 sets Seated thoracic extension over chair with feet propped on stool (to decrease lumbar discomfort) to promote thoracic mobility. 10x2 with 5 seconds.  0/10 mid back pain afterwards  Pt states feeling R low back pain/soreness at times. Long sit test suggests posterior nutation of R innominate.   Seated L hip extension and R hip flexion isometrics 10x5 seconds for 2 sets.   Reviewed and given as part of his HEP. Pt demonstrated and verbalized understanding.   Standing bilateral shoulder extension resisting green band 10x2 with 5 second holds to promote thoracic extension.  seated L trunk side bend 10x5 seconds   Improved exercise technique, movement at target joints, use of target muscles after mod verbal, visual, tactile cues.        Decreased thoracic pain to 0/10 after exercises to promote extension and posture. Pt demonstrates posterior nutation of R innominate based on long sit test. Exercises to strengthen L glute max and R hip flexor muscles performed to promote more neutral lumbopelvic position.           PT Long Term Goals - 01/25/16 1706      PT LONG TERM GOAL #1   Title Patient will have a decrease in back pain to 3/10 or less at worst to promote ability to perform functional tasks as well as to improve ability to go on hikes.    Baseline 6-7/10 at worst.    Time 6   Period Weeks   Status New     PT LONG TERM GOAL #2   Title Patient will improve bilateral hip strength by at least 1/2 MMT grade to improve femoral control with standing tasks, improve ability to go on hikes with less back pain.    Time 6   Period Weeks   Status New     PT LONG TERM GOAL #3   Title Patient will improve his Modified Oswestry Low Back Pain Disability Questionnaire score by at least 12% as a demonstration of improved function.    Baseline 30%   Time 6   Period Weeks   Status New               Plan - 02/01/16 1526    Clinical Impression Statement Decreased  thoracic pain to 0/10 after exercises to promote extension and posture. Pt demonstrates posterior nutation of R innominate based on long sit test. Exercises to strengthen L glute max and R hip flexor muscles performed to promote more neutral lumbopelvic position.    Rehab Potential Good   Clinical Impairments Affecting Rehab Potential chronicity of condition   PT Frequency 2x / week   PT Duration 6 weeks   PT Treatment/Interventions Manual techniques;Therapeutic exercise;Therapeutic activities;Neuromuscular  re-education;Patient/family education;Traction;Electrical Stimulation;Aquatic Therapy   PT Next Visit Plan posture, lumbar and thoracic mobility, hip strengthening, lumbopelvic control   Consulted and Agree with Plan of Care Patient      Patient will benefit from skilled therapeutic intervention in order to improve the following deficits and impairments:  Pain, Postural dysfunction, Improper body mechanics, Hypomobility, Difficulty walking, Decreased strength  Visit Diagnosis: Pain in thoracic spine  Bilateral low back pain, with sciatica presence unspecified  Muscle weakness (generalized)     Problem List Patient Active Problem List   Diagnosis Date Noted  . Hyperlipidemia 11/27/2015  . Thoracic back pain 11/27/2015  . Fatigue 11/27/2015  . Chronic fatigue 11/27/2015  . SVT (supraventricular tachycardia) ([place]) 09/16/2015  . Paroxysmal atrial fibrillation ([place]) 09/16/2015  . 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 Esophageal reflux 09/16/2015  . Preventative health care 09/16/2015   Joneen Boers PT, DPT  02/01/2016, 7:13 PM  [facility] [address], [place], [address] Phone: [phone]   Fax:  [phone]  Name: Adam Juarez MRN: [id number] Date of Birth: 01-Jan-1941

## 2016-02-01 NOTE — Patient Instructions (Addendum)
 (  Home) Extension: Thoracic With Lumbar Lock - Sitting    Sit with back against chair, knees bent, hands folded across (not shown). Extend trunk over chair back. Hold position for __5__ seconds. Repeat ___10  times per set. Do _3___ sets per session daily.   Copyright  VHI. All rights reserved.      Strengthening: Resisted Extension    Hold band, one end on each hand, arm forward. Pull arm back to your side, elbow straight. Hold for 5 seconds Repeat ___10_ times per set. Do _3___ sets per session. Do _1__ sessions per day.  http://orth.exer.us/832   Copyright  VHI. All rights reserved.       Seated L hip extension and R hip flexion isometrics 10x5 seconds for 3 sets daily.   Reviewed and given as part of his HEP. Pt demonstrated and verbalized understanding.

## 2016-02-02 DIAGNOSIS — L820 Inflamed seborrheic keratosis: Secondary | ICD-10-CM | POA: Diagnosis not present

## 2016-02-02 DIAGNOSIS — X32XXXA Exposure to sunlight, initial encounter: Secondary | ICD-10-CM | POA: Diagnosis not present

## 2016-02-02 DIAGNOSIS — L570 Actinic keratosis: Secondary | ICD-10-CM | POA: Diagnosis not present

## 2016-02-02 DIAGNOSIS — L538 Other specified erythematous conditions: Secondary | ICD-10-CM | POA: Diagnosis not present

## 2016-02-06 ENCOUNTER — Ambulatory Visit: Payer: Medicare Other

## 2016-02-06 DIAGNOSIS — M545 Low back pain: Secondary | ICD-10-CM

## 2016-02-06 DIAGNOSIS — M546 Pain in thoracic spine: Secondary | ICD-10-CM

## 2016-02-06 DIAGNOSIS — M6281 Muscle weakness (generalized): Secondary | ICD-10-CM

## 2016-02-06 NOTE — Therapy (Signed)
[facility] [address], [place], [address] Phone: [phone]   Fax:  [phone]  Physical Therapy Treatment  Patient Details  Name: Adam Juarez MRN: [id number] Date of Birth: 20-Jan-1941 Referring Provider: Coral Spikes, DO  Encounter Date: 02/06/2016      PT End of Session - 02/06/16 1025    Visit Number 4   Number of Visits 13   Date for PT Re-Evaluation 03/08/16   Authorization Type 4   Authorization Time Period of 10   PT Start Time 1025   PT Stop Time 1110   PT Time Calculation (min) 45 min   Activity Tolerance Patient tolerated treatment well   Behavior During Therapy [facility] for tasks assessed/performed      Past Medical History:  Diagnosis Date  . Arthritis    fingers  . Cancer ([place]) 1991, 2011   skin cancer   . Chicken pox   . Dysrhythmia 09/2013   Hx. a-fib x 1 episode patient states he "auto corrected" seen at [facility]  . GERD (gastroesophageal reflux disease)   . Headache    poor posture - none recently  . PSVT (paroxysmal supraventricular tachycardia) ([place]) 2009   Controlled with "breathing process"  . Renal stone 9/08, 6/15  . Wears dentures    full upper    Past Surgical History:  Procedure Laterality Date  . CARDIAC CATHETERIZATION  [address], [place]  . CATARACT EXTRACTION W/PHACO Left 07/27/2015   Procedure: CATARACT EXTRACTION PHACO AND INTRAOCULAR LENS PLACEMENT (IOC);  Surgeon: Leandrew Koyanagi, MD;  Location: [facility];  Service: Ophthalmology;  Laterality: Left;  TORIC  . CATARACT EXTRACTION W/PHACO Right 08/24/2015   Procedure: CATARACT EXTRACTION PHACO AND INTRAOCULAR LENS PLACEMENT (IOC);  Surgeon: Leandrew Koyanagi, MD;  Location: [facility];  Service: Ophthalmology;  Laterality: Right;  TORIC  . HEMORROIDECTOMY  2010   banding  . KIDNEY STONE SURGERY  9/08, 6/15   lithotripsy  . SKIN CANCER EXCISION    . VASECTOMY       There were no vitals filed for this visit.      Subjective Assessment - 02/06/16 1027    Subjective Back is doing fine today. The exercise when he leans back on the chair helps him a lot. No mid back pain currently. Had a little low back pain when he woke up with some lingering discomfort.  1-2/10 low back pain currently.  Pt also states that he gets excruciating pain at times at the L side of his neck. Concerned about a carotid artery. Getting that checked up at his next MD appointment.    Pertinent History R thoracic back pain. Pt also states having lower back pain as well. Has had back pain for years. Pt states that he was in the [employer] for 20 years which involved physical stress.  Pt also states a completly torn R quad muscle and a torn L quadriceps muscle which may cause some hip discomfort.  Pt recently changed doctors and his current MD saw that he had back pain and suggested to try PT.  Had PT for his back 3 years ago which helped temporarily which invovled increasing mobility and core strengthening.  R thoracic pain began around March 2017, unknown method of injury, sudden onset. Denies bowel or bladder problems, denies saddle anesthesia. Pt adds that he tore his R quadriceps in 2002 (pt stepped onto a hole) and L quadriceps in 2004 (pt  slipped on ice). Denies osteoporosis. Has hx of skin cancer.    Patient Stated Goals Improve mobility, less or no pain. "I'd like to hike with my wife."   Currently in Pain? Yes   Pain Score 2   0/10 mid back pain, 1-2/10 low back pain currently.    Pain Onset More than a month ago                                 PT Education - 02/06/16 1035    Education provided Yes   Education Details ther-ex   [employer]) Educated Patient   Methods Explanation;Demonstration;Tactile cues;Verbal cues   Comprehension Returned demonstration;Verbalized understanding       Objectives  There-ex  Directed pt with seated L hip extension, R  hip flexion isometrics 10x5 seconds for 2 sets  Then 10x5 seconds for 2 sets with L foot on 3 inch step. Decreased low back pain.   Supine thoracic extension over bolster 10x5 seconds. Good thoracic extension felt by pt.  Supine open books 10x5 seconds  Then with horizontal towel roll behind upper to mid thoracic spine 10x5 seconds for 2 sets  Standing L trunk side bend stretch 10x5 seconds   No reproduction of L cervical pain with cervical AROM all planes  Standing bilateral shoulder extension resisting green band 10x5 seconds   Mid rows at OMEGA machine plate 15 for for [id number] 5 seconds for 3 sets  Reviewed and given as part of his HEP. Pt demonstrated and verbalized understanding. Pt has access to gym equipment.   Latissimus pulldowns at [facility] machine, plate 25 for [id number] seconds    Improved exercise technique, movement at target joints, use of target muscles after mod verbal, visual, tactile cues.     Decreased low back pain with exrerese to promote more neutral lumbopelvic positioning, and thoracic extension.              PT Long Term Goals - 01/25/16 1706      PT LONG TERM GOAL #1   Title Patient will have a decrease in back pain to 3/10 or less at worst to promote ability to perform functional tasks as well as to improve ability to go on hikes.    Baseline 6-7/10 at worst.    Time 6   Period Weeks   Status New     PT LONG TERM GOAL #2   Title Patient will improve bilateral hip strength by at least 1/2 MMT grade to improve femoral control with standing tasks, improve ability to go on hikes with less back pain.    Time 6   Period Weeks   Status New     PT LONG TERM GOAL #3   Title Patient will improve his Modified Oswestry Low Back Pain Disability Questionnaire score by at least 12% as a demonstration of improved function.    Baseline 30%   Time 6   Period Weeks   Status New               Plan - 02/06/16 1024    Clinical Impression Statement  Decreased low back pain with exrerese to promote more neutral lumbopelvic positioning, and thoracic extension.   Rehab Potential Good   Clinical Impairments Affecting Rehab Potential chronicity of condition   PT Frequency 2x / week   PT Duration 6 weeks   PT Treatment/Interventions Manual techniques;Therapeutic exercise;Therapeutic activities;Neuromuscular re-education;Patient/family education;Traction;Electrical Stimulation;Aquatic Therapy  PT Next Visit Plan posture, lumbar and thoracic mobility, hip strengthening, lumbopelvic control   Consulted and Agree with Plan of Care Patient      Patient will benefit from skilled therapeutic intervention in order to improve the following deficits and impairments:  Pain, Postural dysfunction, Improper body mechanics, Hypomobility, Difficulty walking, Decreased strength  Visit Diagnosis: Pain in thoracic spine  Bilateral low back pain, with sciatica presence unspecified  Muscle weakness (generalized)     Problem List Patient Active Problem List   Diagnosis Date Noted  . Hyperlipidemia 11/27/2015  . Thoracic back pain 11/27/2015  . Fatigue 11/27/2015  . Chronic fatigue 11/27/2015  . SVT (supraventricular tachycardia) ([place]) 09/16/2015  . Paroxysmal atrial fibrillation ([place]) 09/16/2015  . 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 Esophageal reflux 09/16/2015  . Preventative health care 09/16/2015    Joneen Boers PT, DPT   02/06/2016, 12:53 PM  [facility] [address], [place], [address] Phone: [phone]   Fax:  [phone]  Name: Kessler Reinbold MRN: [id number] Date of Birth: 1941/04/22

## 2016-02-06 NOTE — Patient Instructions (Signed)
  Gave mid rows using OMEGA machine, plate 15 for for [id number] 5 seconds for 3 sets  as part of his HEP. Pt demonstrated and verbalized understanding. Pt states having access to gym equipment.

## 2016-02-07 ENCOUNTER — Telehealth: Payer: Self-pay | Admitting: Family Medicine

## 2016-02-07 NOTE — Telephone Encounter (Signed)
Pt called about wanting to get a US done to check for a aortic aneurysm. Pt states his dad passed away from that. Need referral. Thank you!  Call pt @ [phone].

## 2016-02-07 NOTE — Telephone Encounter (Signed)
Would you like for him to be seen first before placing referral.

## 2016-02-08 NOTE — Telephone Encounter (Signed)
Ok. I called pt and left a vm to call the office. °

## 2016-02-08 NOTE — Telephone Encounter (Signed)
Noted. Patient can be evaluated sooner than then it needed at a walk-in clinic. Where we like a referral to?

## 2016-02-08 NOTE — Telephone Encounter (Signed)
Pt scheduled 02/13/16 on 115 pt would like a referral but mainly his having neck pain not sure if its muscle.

## 2016-02-08 NOTE — Telephone Encounter (Signed)
Covering for Dr. Lacinda Axon. I would suggest having him be seen in the office to discuss this prior to ordering the ultrasound.

## 2016-02-08 NOTE — Telephone Encounter (Signed)
Please call and schedule an appt with Dr.Cook.

## 2016-02-09 NOTE — Telephone Encounter (Signed)
Pt aware and will be seen to talk about getting checked to see if he has one due to family hx of family members having this.

## 2016-02-13 ENCOUNTER — Ambulatory Visit: Payer: Medicare Other | Admitting: Family Medicine

## 2016-02-13 DIAGNOSIS — Z0289 Encounter for other administrative examinations: Secondary | ICD-10-CM

## 2016-02-14 ENCOUNTER — Ambulatory Visit: Payer: Medicare Other | Attending: Family Medicine

## 2016-02-14 DIAGNOSIS — M6281 Muscle weakness (generalized): Secondary | ICD-10-CM

## 2016-02-14 DIAGNOSIS — M546 Pain in thoracic spine: Secondary | ICD-10-CM

## 2016-02-14 NOTE — Therapy (Signed)
[facility] [address], [place], [address] Phone: [phone]   Fax:  [phone]  Physical Therapy Treatment  Patient Details  Name: Adam Juarez MRN: [id number] Date of Birth: 1940/11/14 Referring Provider: Coral Spikes, DO  Encounter Date: 02/14/2016      PT End of Session - 02/14/16 1115    Visit Number 5   Number of Visits 13   Date for PT Re-Evaluation 03/08/16   Authorization Type 5   Authorization Time Period of 10   PT Start Time 1116   PT Stop Time 1159   PT Time Calculation (min) 43 min   Activity Tolerance Patient tolerated treatment well   Behavior During Therapy [facility] for tasks assessed/performed      Past Medical History:  Diagnosis Date  . Arthritis    fingers  . Cancer ([place]) 1991, 2011   skin cancer   . Chicken pox   . Dysrhythmia 09/2013   Hx. a-fib x 1 episode patient states he "auto corrected" seen at [facility]  . GERD (gastroesophageal reflux disease)   . Headache    poor posture - none recently  . PSVT (paroxysmal supraventricular tachycardia) ([place]) 2009   Controlled with "breathing process"  . Renal stone 9/08, 6/15  . Wears dentures    full upper    Past Surgical History:  Procedure Laterality Date  . CARDIAC CATHETERIZATION  [address], [place]  . CATARACT EXTRACTION W/PHACO Left 07/27/2015   Procedure: CATARACT EXTRACTION PHACO AND INTRAOCULAR LENS PLACEMENT (IOC);  Surgeon: Leandrew Koyanagi, MD;  Location: [facility];  Service: Ophthalmology;  Laterality: Left;  TORIC  . CATARACT EXTRACTION W/PHACO Right 08/24/2015   Procedure: CATARACT EXTRACTION PHACO AND INTRAOCULAR LENS PLACEMENT (IOC);  Surgeon: Leandrew Koyanagi, MD;  Location: [facility];  Service: Ophthalmology;  Laterality: Right;  TORIC  . HEMORROIDECTOMY  2010   banding  . KIDNEY STONE SURGERY  9/08, 6/15   lithotripsy  . SKIN CANCER EXCISION    . VASECTOMY       There were no vitals filed for this visit.      Subjective Assessment - 02/14/16 1117    Subjective Back is doing good. Has been doing his exercises. Felt good until yesterday morning. Did not do anything for his back yesterday. 0/10 back pain currently.    Pertinent History R thoracic back pain. Pt also states having lower back pain as well. Has had back pain for years. Pt states that he was in the [employer] for 20 years which involved physical stress.  Pt also states a completly torn R quad muscle and a torn L quadriceps muscle which may cause some hip discomfort.  Pt recently changed doctors and his current MD saw that he had back pain and suggested to try PT.  Had PT for his back 3 years ago which helped temporarily which invovled increasing mobility and core strengthening.  R thoracic pain began around March 2017, unknown method of injury, sudden onset. Denies bowel or bladder problems, denies saddle anesthesia. Pt adds that he tore his R quadriceps in 2002 (pt stepped onto a hole) and L quadriceps in 2004 (pt slipped on ice). Denies osteoporosis. Has hx of skin cancer.    Patient Stated Goals Improve mobility, less or no pain. "I'd like to hike with my wife."   Currently in Pain? No/denies   Pain Onset More than a month ago  PT Education - 02/14/16 1124    Education provided Yes   Education Details ther-ex, HEP   Person(s) Educated Patient   Methods Explanation;Demonstration;Tactile cues;Verbal cues;Handout   Comprehension Returned demonstration;Verbalized understanding         Objectives   There-ex   Directed pt with supine thoracic extension over bolster 10x5 seconds x 2 sets  Forward step up onto 3 inch step 10x 2 each LE, emphasis on femoral control. No UE assist.  Forward step ups onto 3 inch step with bilateral shoulder extension (to promote thoracic extension) 10x2 for each LE,   Lateral step ups onto 3 inch  step 10x each side  Standing pectoralis stretch at door frame 30 seconds x 3 each UE  seated L hip extension (L foot propped on 3 inch step), R hip flexion isometrics 10x5 seconds    Improved exercise technique, movement at target joints, use of target muscles after mod verbal, visual, tactile cues.                  PT Long Term Goals - 01/25/16 1706      PT LONG TERM GOAL #1   Title Patient will have a decrease in back pain to 3/10 or less at worst to promote ability to perform functional tasks as well as to improve ability to go on hikes.    Baseline 6-7/10 at worst.    Time 6   Period Weeks   Status New     PT LONG TERM GOAL #2   Title Patient will improve bilateral hip strength by at least 1/2 MMT grade to improve femoral control with standing tasks, improve ability to go on hikes with less back pain.    Time 6   Period Weeks   Status New     PT LONG TERM GOAL #3   Title Patient will improve his Modified Oswestry Low Back Pain Disability Questionnaire score by at least 12% as a demonstration of improved function.    Baseline 30%   Time 6   Period Weeks   Status New               Plan - 02/14/16 1139    Clinical Impression Statement No complain of back pain throughout session. Added LE strengthening in addition to thoracic extension/mobility exercises to promote pt ability to go on hikes with his wife (pt goal).    Rehab Potential Good   Clinical Impairments Affecting Rehab Potential chronicity of condition   PT Frequency 2x / week   PT Duration 6 weeks   PT Treatment/Interventions Manual techniques;Therapeutic exercise;Therapeutic activities;Neuromuscular re-education;Patient/family education;Traction;Electrical Stimulation;Aquatic Therapy   PT Next Visit Plan posture, lumbar and thoracic mobility, hip strengthening, lumbopelvic control   Consulted and Agree with Plan of Care Patient      Patient will benefit from skilled therapeutic intervention  in order to improve the following deficits and impairments:  Pain, Postural dysfunction, Improper body mechanics, Hypomobility, Difficulty walking, Decreased strength  Visit Diagnosis: Pain in thoracic spine  Muscle weakness (generalized)     Problem List Patient Active Problem List   Diagnosis Date Noted  . Hyperlipidemia 11/27/2015  . Thoracic back pain 11/27/2015  . Fatigue 11/27/2015  . Chronic fatigue 11/27/2015  . SVT (supraventricular tachycardia) ([place]) 09/16/2015  . Paroxysmal atrial fibrillation ([place]) 09/16/2015  . 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 Esophageal reflux 09/16/2015  . Preventative health care 09/16/2015    Joneen Boers PT, DPT   02/14/2016, 12:17 PM  [facility] [address], [place], [address] Phone: [phone]   Fax:  [phone]  Name: Adam Juarez MRN: [id number] Date of Birth: Jun 11, 1940

## 2016-02-14 NOTE — Patient Instructions (Signed)
CHEST: Doorway, Unilateral - Standing    Standing in doorway, place one hand on wall with elbow bent at shoulder height. Lean forward. Hold __20-30_ seconds. __3_ reps per set, _2-3__ sets per day  Copyright  VHI. All rights reserved.

## 2016-02-16 ENCOUNTER — Ambulatory Visit: Payer: Medicare Other

## 2016-02-16 ENCOUNTER — Ambulatory Visit (INDEPENDENT_AMBULATORY_CARE_PROVIDER_SITE_OTHER): Payer: Medicare Other | Admitting: Family Medicine

## 2016-02-16 VITALS — BP 120/82 | HR 82 | Temp 98.0°F | Wt 207.5 lb

## 2016-02-16 DIAGNOSIS — Z8249 Family history of ischemic heart disease and other diseases of the circulatory system: Secondary | ICD-10-CM | POA: Diagnosis not present

## 2016-02-16 DIAGNOSIS — Z87891 Personal history of nicotine dependence: Secondary | ICD-10-CM | POA: Diagnosis not present

## 2016-02-16 DIAGNOSIS — Z136 Encounter for screening for cardiovascular disorders: Secondary | ICD-10-CM

## 2016-02-16 DIAGNOSIS — M6281 Muscle weakness (generalized): Secondary | ICD-10-CM | POA: Diagnosis not present

## 2016-02-16 DIAGNOSIS — M542 Cervicalgia: Secondary | ICD-10-CM

## 2016-02-16 DIAGNOSIS — M546 Pain in thoracic spine: Secondary | ICD-10-CM | POA: Diagnosis not present

## 2016-02-16 NOTE — Patient Instructions (Signed)
We will call regarding your [place].  Let me know if the pain in your neck continues.  Take care  Dr. Lacinda Axon

## 2016-02-16 NOTE — Therapy (Signed)
[facility] [address] [place], [address] Phone: [phone]   Fax:  [phone]  Physical Therapy Treatment And Discharge Summary   Patient Details  Name: Adam Juarez MRN: [id number] Date of Birth: [birth year] Referring Provider: Coral Spikes, DO  Encounter Date: 02/16/2016      PT End of Session - 02/16/16 1023    Visit Number 6   Number of Visits 13   Date for PT Re-Evaluation 03/08/16   Authorization Type 6   Authorization Time Period of 10   PT Start Time 1024   PT Stop Time 1053   PT Time Calculation (min) 29 min   Activity Tolerance Patient tolerated treatment well   Behavior During Therapy [facility] for tasks assessed/performed      Past Medical History:  Diagnosis Date  . Arthritis    fingers  . Cancer ([place]) 1991, 2011   skin cancer   . Chicken pox   . Dysrhythmia 09/2013   Hx. a-fib x 1 episode patient states he "auto corrected" seen at [facility]  . GERD (gastroesophageal reflux disease)   . Headache    poor posture - none recently  . PSVT (paroxysmal supraventricular tachycardia) ([place]) 2009   Controlled with "breathing process"  . Renal stone 9/08, 6/15  . Wears dentures    full upper    Past Surgical History:  Procedure Laterality Date  . CARDIAC CATHETERIZATION  [address] [place]  . CATARACT EXTRACTION W/PHACO Left 07/27/2015   Procedure: CATARACT EXTRACTION PHACO AND INTRAOCULAR LENS PLACEMENT (IOC);  Surgeon: Leandrew Koyanagi, MD;  Location: [facility];  Service: Ophthalmology;  Laterality: Left;  TORIC  . CATARACT EXTRACTION W/PHACO Right 08/24/2015   Procedure: CATARACT EXTRACTION PHACO AND INTRAOCULAR LENS PLACEMENT (IOC);  Surgeon: Leandrew Koyanagi, MD;  Location: [facility];  Service: Ophthalmology;  Laterality: Right;  TORIC  . HEMORROIDECTOMY  2010   banding  . KIDNEY STONE SURGERY  9/08, 6/15   lithotripsy  . SKIN CANCER  EXCISION    . VASECTOMY      There were no vitals filed for this visit.      Subjective Assessment - 02/16/16 1024    Subjective Back is better. No pain currently. No pain for the past 2 days. The exercises at home are helping. Feels like he can continue his progress with his exercises at home.    Pertinent History R thoracic back pain. Pt also states having lower back pain as well. Has had back pain for years. Pt states that he was in the [employer] for 20 years which involved physical stress.  Pt also states a completly torn R quad muscle and a torn L quadriceps muscle which may cause some hip discomfort.  Pt recently changed doctors and his current MD saw that he had back pain and suggested to try PT.  Had PT for his back 3 years ago which helped temporarily which invovled increasing mobility and core strengthening.  R thoracic pain began around March 2017, unknown method of injury, sudden onset. Denies bowel or bladder problems, denies saddle anesthesia. Pt adds that he tore his R quadriceps in 2002 (pt stepped onto a hole) and L quadriceps in 2004 (pt slipped on ice). Denies osteoporosis. Has hx of skin cancer.    Patient Stated Goals Improve mobility, less or no pain. "I'd like to hike with my wife."   Currently in Pain? No/denies   Pain Score 0-No  pain   Pain Onset More than a month ago            [facility] PT Assessment - 02/16/16 1105      Observation/Other Assessments   Modified [place] 2%                             PT Education - 02/16/16 1107    Education provided Yes   Education Details ther-ex, HEP, progress, plan of care (D/C with HEP)   Person(s) Educated Patient   Methods Explanation;Demonstration;Tactile cues;Verbal cues;Handout   Comprehension Verbalized understanding;Returned demonstration        Objectives   There-ex   Directed pt with manually resisted seated hip flexion, S/L hip abduction, prone glute max extension 1-2x each way for  each LE.   Reviewed progress current status with hip strength with pt. R lateral hip discomfort with S/L manually resisted R hip abduction which eased with rest. TTP R lateral hip and superior to R greater trochanter.    SLS with contralateral foot resting on first step with one UE assist 1 min x 4 each side to promote glute med muscle strength and decrease R lateral hip pain.   Reviewed and given as part of his HEP. Pt demonstrated and verbalized understanding.   Reviewed pt progress with pain level and plan of care (continue progress with HEP)   Improved exercise technique, movement at target joints, use of target muscles after min to mod verbal, visual, tactile cues.      Patient has demonstrated decreased back pain, improved function and improved R hip flexion and abduction strength since initial evaluation. Patient has progressed very well with physical therapy towards goals and is independent and compliant with his home exercise program. Skilled physical therapy services discharged with patient continuing progress with his exercises at home.            PT Long Term Goals - 02/16/16 1027      PT LONG TERM GOAL #1   Title Patient will have a decrease in back pain to 3/10 or less at worst to promote ability to perform functional tasks as well as to improve ability to go on hikes.    Baseline 6-7/10 at worst. 0/10 at worst for the past 2 days (02/16/2016)   Time 6   Period Weeks   Status Achieved     PT LONG TERM GOAL #2   Title Patient will improve bilateral hip strength by at least 1/2 MMT grade to improve femoral control with standing tasks, improve ability to go on hikes with less back pain.    Time 6   Period Weeks   Status Partially Met     PT LONG TERM GOAL #3   Title Patient will improve his Modified Oswestry Low Back Pain Disability Questionnaire score by at least 12% as a demonstration of improved function.    Baseline 30%; Current score: 2% (02/16/2016)   Time 6    Period Weeks   Status Achieved               Plan - 02/16/16 1109    Clinical Impression Statement  Patient has demonstrated decreased back pain, improved function and improved R hip flexion and abduction strength since initial evaluation. Patient has progressed very well with physical therapy towards goals and is independent and compliant with his home exercise program. Skilled physical therapy services discharged with patient continuing progress with his exercises at  home.    Rehab Potential Good   Clinical Impairments Affecting Rehab Potential --   PT Frequency --   PT Duration --   PT Treatment/Interventions Manual techniques;Therapeutic exercise;Therapeutic activities;Neuromuscular re-education;Patient/family education   PT Next Visit Plan Continue progress with HEP.    Consulted and Agree with Plan of Care Patient      Patient will benefit from skilled therapeutic intervention in order to improve the following deficits and impairments:     Visit Diagnosis: Pain in thoracic spine  Muscle weakness (generalized)       G-Codes - 2016-02-25 1111    Functional Assessment Tool Used Modified Oswestry Low Back Pain Disability Questionnaire, patient interview, clinical presentation.    Functional Limitation Mobility: Walking and moving around   Mobility: Walking and Moving Around Goal Status [phone]) At least 1 percent but less than 20 percent impaired, limited or restricted   Mobility: Walking and Moving Around Discharge Status [phone]) At least 1 percent but less than 20 percent impaired, limited or restricted      Problem List Patient Active Problem List   Diagnosis Date Noted  . Hyperlipidemia 11/27/2015  . Thoracic back pain 11/27/2015  . Fatigue 11/27/2015  . Chronic fatigue 11/27/2015  . SVT (supraventricular tachycardia) ([place]) 09/16/2015  . Paroxysmal atrial fibrillation ([place]) 09/16/2015  . 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 Esophageal reflux 09/16/2015  . Preventative  health care 09/16/2015    Thank you for your referral.   Joneen Boers PT, DPT   02/25/16, 11:19 AM  [facility] [address], [place], [address] Phone: [phone]   Fax:  [phone]  Name: Adam Juarez MRN: [id number] Date of Birth: 03-31-1941

## 2016-02-16 NOTE — Progress Notes (Signed)
Subjective:  Patient ID: Adam Juarez, male    DOB: Mar 30, 1941  Age: 75 y.o. MRN: [id number]  CC: Wants AAA screening, Neck pain  HPI:  75 year old male with a history of SVT and paroxysmal A. Fib presents with the above complaints.  AAA screening  Patient is a former smoker.  Meets USPSTF criteria for screening.  States Father died of a ruptured AAA.  He would like screening.  Neck pain  Patient reports he continues to have brief, intermittent left-sided neck pain.  Pain is located posteriorly extending from the mastoid area down.  He states that it is brief lasting about a minute.  He describes it as sharp and severe. He states it is 10/10 in severity.  No associated upper extremity numbness, tingling, or weakness.  No known exacerbating or relieving factors.  No other associated symptoms.  Social Hx   Social History   Social History  . Marital status: Married    Spouse name: N/A  . Number of children: N/A  . Years of education: N/A   Social History Main Topics  . Smoking status: Former Smoker    Packs/day: 0.50    Years: 38.00    Types: Cigarettes    Quit date: 07/13/1997  . Smokeless tobacco: Never Used  . Alcohol use No  . Drug use: No  . Sexual activity: Not on file   Other Topics Concern  . Not on file   Social History Narrative  . No narrative on file   Review of Systems  Musculoskeletal: Positive for neck pain.  Neurological: Negative for weakness and numbness.   Objective:  BP 120/82 (BP Location: Right Arm, Patient Position: Sitting, Cuff Size: Normal)   Pulse 82   Temp 98 F (36.7 C) (Oral)   Wt 207 lb 8 oz (94.1 kg)   SpO2 94%   BMI 28.94 kg/m   BP/Weight 02/16/2016 [id number]  Systolic BP 123456 [id number] 123456  Diastolic BP 82 80 78  Wt. (Lbs) 207.5 205.75 205.75  BMI 28.94 28.71 28.71   Physical Exam  Constitutional: He is oriented to person, place, and time. He appears well-developed. No distress.  Cardiovascular: Normal  rate and regular rhythm.   Pulmonary/Chest: Effort normal. He has no wheezes. He has no rales.  Abdominal: Soft. He exhibits no distension. There is no tenderness. There is no rebound and no guarding.  Musculoskeletal:  Neck - L trapezius muscle tightness. Normal ROM. Normal upper extremity strength.   Neurological: He is alert and oriented to person, place, and time.  Psychiatric: He has a normal mood and affect.  Vitals reviewed.  Lab Results  Component Value Date   WBC 6.3 10/13/2013   HGB 15.4 10/13/2013   HCT 45.2 10/13/2013   PLT 156 10/13/2013   GLUCOSE 83 10/13/2013   ALT 26 10/13/2013   AST 34 10/13/2013   NA 137 10/13/2013   K 4.1 10/13/2013   CL 106 10/13/2013   CREATININE 1.16 10/13/2013   BUN 24 (H) 10/13/2013   CO2 25 10/13/2013    Assessment & Plan:   Problem List Items Addressed This Visit    Encounter for abdominal aortic aneurysm (AAA) screening    Patient meets criteria for screening and desires screening. Will arrange.      Relevant Orders   US Aorta   Neck pain    Established problem, worsening. Appears MSK in etiology. Discussed imaging of the neck and patient elected to wait. Advised PRN Tylenol/NSAID's. Patient in agreement.  Other Visit Diagnoses    Screening for AAA (abdominal aortic aneurysm)    -  Primary   Relevant Orders   US Aorta   Former smoker       Relevant Orders   US Aorta   Family history of abdominal aortic aneurysm (AAA)       Relevant Orders   US Aorta     Follow-up: Return in about 1 year (around 02/15/2017), or if symptoms worsen or fail to improve.  [place]

## 2016-02-16 NOTE — Progress Notes (Signed)
Pre visit review using our clinic review tool, if applicable. No additional management support is needed unless otherwise documented below in the visit note. 

## 2016-02-16 NOTE — Patient Instructions (Addendum)
      Standing holding onto something sturdy for support:   Place one foot onto the 1st step.   Squeeze your rear end muscles together.    Hold for 1 minute, 40% - 70 % effort.   Repeat 5 times for each side.   Perform 2-3  set daily.

## 2016-02-17 DIAGNOSIS — M542 Cervicalgia: Secondary | ICD-10-CM | POA: Insufficient documentation

## 2016-02-17 DIAGNOSIS — Z136 Encounter for screening for cardiovascular disorders: Secondary | ICD-10-CM | POA: Insufficient documentation

## 2016-02-17 NOTE — Assessment & Plan Note (Signed)
Established problem, worsening. Appears MSK in etiology. Discussed imaging of the neck and patient elected to wait. Advised PRN Tylenol/NSAID's. Patient in agreement.

## 2016-02-17 NOTE — Assessment & Plan Note (Signed)
Patient meets criteria for screening and desires screening. Will arrange.

## 2016-02-20 ENCOUNTER — Telehealth: Payer: Self-pay | Admitting: *Deleted

## 2016-02-20 ENCOUNTER — Other Ambulatory Visit: Payer: Self-pay | Admitting: Family Medicine

## 2016-02-20 DIAGNOSIS — I714 Abdominal aortic aneurysm, without rupture, unspecified: Secondary | ICD-10-CM

## 2016-02-20 NOTE — Telephone Encounter (Signed)
US ordered

## 2016-02-20 NOTE — Telephone Encounter (Signed)
Amber called wanting to make you aware of the CT done in 2015 on pt that showed a aortic aneurysm. She needs orders changed to [place] retro complete due to this is not a screening no longer.

## 2016-02-20 NOTE — Telephone Encounter (Signed)
Amber from [employer] has requested a Database administrator [phone]

## 2016-02-20 NOTE — Telephone Encounter (Signed)
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

## 2016-02-22 ENCOUNTER — Ambulatory Visit
Admission: RE | Admit: 2016-02-22 | Discharge: 2016-02-22 | Disposition: A | Discharge status: Home / Self Care | Payer: Medicare Other | Source: Ambulatory Visit | Attending: Family Medicine | Admitting: Family Medicine

## 2016-02-22 DIAGNOSIS — I714 Abdominal aortic aneurysm, without rupture, unspecified: Secondary | ICD-10-CM

## 2016-03-14 ENCOUNTER — Ambulatory Visit (INDEPENDENT_AMBULATORY_CARE_PROVIDER_SITE_OTHER): Payer: Medicare Other

## 2016-03-14 ENCOUNTER — Encounter: Payer: Self-pay | Admitting: Family Medicine

## 2016-03-14 ENCOUNTER — Ambulatory Visit (INDEPENDENT_AMBULATORY_CARE_PROVIDER_SITE_OTHER): Payer: Medicare Other | Admitting: Family Medicine

## 2016-03-14 VITALS — BP 142/86 | HR 75 | Temp 97.8°F | Resp 16 | Wt 210.5 lb

## 2016-03-14 DIAGNOSIS — M546 Pain in thoracic spine: Secondary | ICD-10-CM

## 2016-03-14 DIAGNOSIS — G8929 Other chronic pain: Secondary | ICD-10-CM

## 2016-03-14 MED ORDER — DICLOFENAC SODIUM 75 MG PO TBEC: 75.0000 mg | DELAYED_RELEASE_TABLET | Freq: Two times a day (BID) | ORAL | 0 refills | Status: DC | PRN

## 2016-03-14 MED ORDER — BACLOFEN 10 MG PO TABS: ORAL_TABLET | ORAL | 0 refills | Status: DC

## 2016-03-14 NOTE — Patient Instructions (Addendum)
  This is muscular in origin.  Be active.   Take the medications as prescribed.  Follow up as needed.  Take care  Dr. Lacinda Axon

## 2016-03-14 NOTE — Progress Notes (Signed)
Subjective:  Patient ID: Adam Juarez, male    DOB: Jun 07, 1940  Age: 75 y.o. MRN: [id number]  CC: Back pain  HPI:  75 year old male presents with complaints of continued pain.  Patient has been seen previously for this. His thoracic back pain has been going on for several months. Located in the mid to lower thoracic spine around the scapula (right). Pain ranges from mild to severe. He has taken the Flexeril once and was quite sedated and has not taken anymore. He reports he takes Tylenol and has improvement with that. He has been to physical therapy and had little response. No known exacerbating factors. No other associated symptoms. No other complaints at this time.  Of note, patient is quite sedentary. He watches TV for the majority of the day.  Social Hx   Social History   Social History  . Marital status: Married    Spouse name: N/A  . Number of children: N/A  . Years of education: N/A   Social History Main Topics  . Smoking status: Former Smoker    Packs/day: 0.50    Years: 38.00    Types: Cigarettes    Quit date: 07/13/1997  . Smokeless tobacco: Never Used  . Alcohol use No  . Drug use: No  . Sexual activity: Not Asked   Other Topics Concern  . None   Social History Narrative  . None    Review of Systems  Constitutional: Negative.   Musculoskeletal: Positive for back pain.   Objective:  BP (!) 142/86 (BP Location: Right Arm, Patient Position: Sitting, Cuff Size: Normal)   Pulse 75   Temp 97.8 F (36.6 C) (Oral)   Resp 16   Wt 210 lb 8 oz (95.5 kg)   SpO2 94%   BMI 29.36 kg/m   BP/Weight 03/14/2016 02/16/2016 [id number]  Systolic BP [id number] 123456 [id number]  Diastolic BP 86 82 80  Wt. (Lbs) 210.5 207.5 205.75  BMI 29.36 28.94 28.71   Physical Exam  Constitutional: He is oriented to person, place, and time. He appears well-developed. No distress.  Pulmonary/Chest: Effort normal.  Musculoskeletal:  Thoracic spine - area of tenderness to the right of the spine near  the scapula. Tight musculature noted.  Neurological: He is alert and oriented to person, place, and time.  Psychiatric: He has a normal mood and affect.  Vitals reviewed.   Lab Results  Component Value Date   WBC 6.3 10/13/2013   HGB 15.4 10/13/2013   HCT 45.2 10/13/2013   PLT 156 10/13/2013   GLUCOSE 83 10/13/2013   ALT 26 10/13/2013   AST 34 10/13/2013   NA 137 10/13/2013   K 4.1 10/13/2013   CL 106 10/13/2013   CREATININE 1.16 10/13/2013   BUN 24 (H) 10/13/2013   CO2 25 10/13/2013    Assessment & Plan:   Problem List Items Addressed This Visit    Chronic right-sided thoracic back pain - Primary    Established problem, worsening. Appears to be MSK in nature. Given duration and lack of improvement, obtaining xray today.  Treating with Diclofenac and Baclofen.  Advised increase in activity as patient is sedentary and watches TV for the majority of the day.        Relevant Medications   baclofen (LIORESAL) 10 MG tablet   diclofenac (VOLTAREN) 75 MG EC tablet   Other Relevant Orders   DG Thoracic Spine 2 View (Completed)    Other Visit Diagnoses   None.     Meds  ordered this encounter  Medications  . baclofen (LIORESAL) 10 MG tablet    Sig: 1 tablet in the evening as needed for spasm/pain.    Dispense:  30 each    Refill:  0  . diclofenac (VOLTAREN) 75 MG EC tablet    Sig: Take 1 tablet (75 mg total) by mouth 2 (two) times daily as needed.    Dispense:  30 tablet    Refill:  0    Follow-up: Return if symptoms worsen or fail to improve.  Zeb

## 2016-03-14 NOTE — Assessment & Plan Note (Signed)
Established problem, worsening. Appears to be MSK in nature. Given duration and lack of improvement, obtaining xray today.  Treating with Diclofenac and Baclofen.  Advised increase in activity as patient is sedentary and watches TV for the majority of the day.

## 2016-03-14 NOTE — Progress Notes (Signed)
Pre visit review using our clinic review tool, if applicable. No additional management support is needed unless otherwise documented below in the visit note. 

## 2016-03-22 DIAGNOSIS — H353132 Nonexudative age-related macular degeneration, bilateral, intermediate dry stage: Secondary | ICD-10-CM | POA: Diagnosis not present

## 2016-04-26 ENCOUNTER — Ambulatory Visit (INDEPENDENT_AMBULATORY_CARE_PROVIDER_SITE_OTHER): Payer: Medicare Other | Admitting: Cardiovascular Disease

## 2016-04-26 ENCOUNTER — Encounter: Payer: Self-pay | Admitting: Cardiovascular Disease

## 2016-04-26 VITALS — BP 130/84 | HR 69 | Ht 70.0 in | Wt 207.2 lb

## 2016-04-26 DIAGNOSIS — I480 Paroxysmal atrial fibrillation: Secondary | ICD-10-CM

## 2016-04-26 DIAGNOSIS — I700 Atherosclerosis of aorta: Secondary | ICD-10-CM | POA: Diagnosis not present

## 2016-04-26 DIAGNOSIS — Z87891 Personal history of nicotine dependence: Secondary | ICD-10-CM

## 2016-04-26 DIAGNOSIS — I739 Peripheral vascular disease, unspecified: Secondary | ICD-10-CM

## 2016-04-26 DIAGNOSIS — Z0000 Encounter for general adult medical examination without abnormal findings: Secondary | ICD-10-CM

## 2016-04-26 DIAGNOSIS — I714 Abdominal aortic aneurysm, without rupture, unspecified: Secondary | ICD-10-CM

## 2016-04-26 DIAGNOSIS — R5382 Chronic fatigue, unspecified: Secondary | ICD-10-CM

## 2016-04-26 DIAGNOSIS — E782 Mixed hyperlipidemia: Secondary | ICD-10-CM

## 2016-04-26 MED ORDER — METOPROLOL SUCCINATE ER 25 MG PO TB24: 50.0000 mg | ORAL_TABLET | Freq: Every day | ORAL | 3 refills | Status: DC

## 2016-04-26 MED ORDER — RIVAROXABAN 20 MG PO TABS: 20.0000 mg | ORAL_TABLET | Freq: Every day | ORAL | 3 refills | Status: DC

## 2016-04-26 MED ORDER — RIVAROXABAN 20 MG PO TABS: 20.0000 mg | ORAL_TABLET | Freq: Every day | ORAL | 6 refills | Status: DC

## 2016-04-26 MED ORDER — METOPROLOL SUCCINATE ER 25 MG PO TB24: 25.0000 mg | ORAL_TABLET | Freq: Every day | ORAL | 6 refills | Status: DC

## 2016-04-26 NOTE — Patient Instructions (Addendum)
Medication Instructions:   Xarelto once a day for stroke prevention (blood thinner)  Please start metoprolol one a day for rate and rhythm control for atrial fib  Labwork:  No new labs needed  Testing/Procedures:  Your physician has requested that you have an echocardiogram. Echocardiography is a painless test that uses sound waves to create images of your heart. It provides your doctor with information about the size and shape of your heart and how well your heart's chambers and valves are working. This procedure takes approximately one hour. There are no restrictions for this procedure.   I recommend watching educational videos on topics of interest to you at:       [email or web address].com  Enter code: HEARTCARE    Follow-Up: It was a pleasure seeing you in the office today. Please call us if you have new issues that need to be addressed before your next appt.  [phone]  Your physician wants you to follow-up in: 6 months.  You will receive a reminder letter in the mail two months in advance. If you don't receive a letter, please call our office to schedule the follow-up appointment.  If you need a refill on your cardiac medications before your next appointment, please call your pharmacy.      Echocardiogram An echocardiogram, or echocardiography, uses sound waves (ultrasound) to produce an image of your heart. The echocardiogram is simple, painless, obtained within a short period of time, and offers valuable information to your health care provider. The images from an echocardiogram can provide information such as:  Evidence of coronary artery disease (CAD).  Heart size.  Heart muscle function.  Heart valve function.  Aneurysm detection.  Evidence of a past heart attack.  Fluid buildup around the heart.  Heart muscle thickening.  Assess heart valve function. Tell a health care provider about:  Any allergies you have.  All medicines you are taking, including  vitamins, herbs, eye drops, creams, and over-the-counter medicines.  Any problems you or family members have had with anesthetic medicines.  Any blood disorders you have.  Any surgeries you have had.  Any medical conditions you have.  Whether you are pregnant or may be pregnant. What happens before the procedure? No special preparation is needed. Eat and drink normally. What happens during the procedure?  In order to produce an image of your heart, gel will be applied to your chest and a wand-like tool (transducer) will be moved over your chest. The gel will help transmit the sound waves from the transducer. The sound waves will harmlessly bounce off your heart to allow the heart images to be captured in real-time motion. These images will then be recorded.  You may need an IV to receive a medicine that improves the quality of the pictures. What happens after the procedure? You may return to your normal schedule including diet, activities, and medicines, unless your health care provider tells you otherwise. This information is not intended to replace advice given to you by your health care provider. Make sure you discuss any questions you have with your health care provider. Document Released: 04/27/2000 Document Revised: 12/17/2015 Document Reviewed: 01/05/2013 Elsevier Interactive Patient Education  2017 [employer].

## 2016-04-26 NOTE — Progress Notes (Signed)
,Cardiology Office Note  Date:  04/26/2016   ID:  Adam Juarez, DOB [birth year], MRN [id number]  PCP:  Adam Spikes, DO   Chief Complaint  Patient presents with  . OHTER    7 month f/u c/o afib. Meds reviewed verbally with pt.    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 Results discussed with him in detail Since that time he reports having paroxysmal tachycardia that is irregular, symptoms similar to previous atrial fibrillation episodes Had 3 episodes this week, was symptomatic, lasted for hours  CHADS VASC 2, he is male, vascular disease history Next year will have score of 3 given increased age  On last clinic visit reported having numerous episodes of tachycardia in April 2017 Monitors heart rate on his watch Numerous episodes of tachycardia in March 2017. last in November 2016.  CT scan abdomen from June 2015 pulled up and reviewed with him in detail in the office. Moderate diffuse descending aorta disease extending into the iliac vessels  Wife recently diagnosed with paroxysmal atrial fibrillation    EKG on today's visit shows normal sinus rhythm with rate 69 bpm, no significant ST or T-wave changes  past medical history reviewed He was admitted to the hospital for atrial fibrillation 08/08/2013, notes indicating he converted to normal sinus rhythm through his hospital course. He was not discharged on  medications as he declined beta blockers  no diabetes  Prior echocardiogram reviewed with him in detail from August was 7 2013 showing normal LV function, mild LVH, a stock dysfunction, aortic valve sclerosis, normal right heart pressures  Holter monitor report August 2013 detailing 12,000 PVCs, 11% of total beat count. 55 supraventricular ectopies, no mention of the longest run   PMH:   has a past medical history of Arthritis; Cancer [facility]) (1991, 2011); Chicken pox; Dysrhythmia (09/2013); GERD (gastroesophageal reflux disease); Headache; PSVT (paroxysmal supraventricular tachycardia) ([place]) (2009); Renal stone (9/08, 6/15); and Wears dentures.  PSH:    Past Surgical History:  Procedure Laterality Date  . CARDIAC CATHETERIZATION  [address], [place]  . CATARACT EXTRACTION W/PHACO Left 07/27/2015   Procedure: CATARACT EXTRACTION PHACO AND INTRAOCULAR LENS PLACEMENT (IOC);  Surgeon: Leandrew Koyanagi, MD;  Location: [place];  Service: Ophthalmology;  Laterality: Left;  TORIC  . CATARACT EXTRACTION W/PHACO Right 08/24/2015   Procedure: CATARACT EXTRACTION PHACO AND INTRAOCULAR LENS PLACEMENT (IOC);  Surgeon: Leandrew Koyanagi, MD;  Location: [place];  Service: Ophthalmology;  Laterality: Right;  TORIC  . HEMORROIDECTOMY  2010   banding  . KIDNEY STONE SURGERY  9/08, 6/15   lithotripsy  . SKIN CANCER EXCISION    . VASECTOMY      Current Outpatient Prescriptions  Medication Sig Dispense Refill  . Ascorbic Acid (VITAMIN C PO) Take by mouth daily.    . Coenzyme Q10 (COQ10) 200 MG CAPS Take 200 mg by mouth daily.    . Cyanocobalamin (VITAMIN B 12 PO) Take 1 tablet by mouth daily.    Marland Kitchen  Magnesium 500 MG CAPS Take 500 mg by mouth daily.    . Misc Natural Products (GLUCOSAMINE CHONDROITIN ADV PO) Take 1 tablet by mouth daily.    . Multiple Vitamins-Minerals (PRESERVISION AREDS 2 PO) Take by mouth 2 (two) times daily.    . Omega-3 Fatty Acids (FISH OIL PO) Take  by mouth every other day.    Marland Kitchen omeprazole (PRILOSEC) 20 MG capsule Take 20 mg by mouth daily.    . polyvinyl alcohol (LIQUIFILM TEARS) 1.4 % ophthalmic solution Place 1 drop into both eyes as needed for dry eyes.    . Probiotic Product (PROBIOTIC DAILY PO) Take by mouth.    . metoprolol succinate (TOPROL-XL) 25 MG 24 hr tablet Take 1 tablet (25 mg total) by mouth daily. Take with or immediately following a meal. 30 tablet 6  . rivaroxaban (XARELTO) 20 MG TABS tablet Take 1 tablet (20 mg total) by mouth daily with supper. 90 tablet 3   No current facility-administered medications for this visit.      Allergies:   Other   Social History:  The patient  reports that he quit smoking about 18 years ago. His smoking use included Cigarettes. He has a 19.00 pack-year smoking history. He has never used smokeless tobacco. He reports that he does not drink alcohol or use drugs.   Family History:   family history includes AAA (abdominal aortic aneurysm) in his father; Arthritis in his mother; Parkinson's disease in his mother.    Review of Systems: Review of Systems  Constitutional: Negative.   Respiratory: Negative.   Cardiovascular: Positive for palpitations.       Tachycardia  Gastrointestinal: Negative.   Musculoskeletal: Negative.   Neurological: Negative.   Psychiatric/Behavioral: Negative.   All other systems reviewed and are negative.    PHYSICAL EXAM: VS:  BP 130/84 (BP Location: Left Arm, Patient Position: Sitting, Cuff Size: Normal)   Pulse 69   Ht 5\' 10"  (1.778 m)   Wt 207 lb 4 oz (94 kg)   BMI 29.74 kg/m  , BMI Body mass index is 29.74 kg/m. GEN: Well nourished, well developed, in no acute distress  HEENT: normal  Neck: no JVD, carotid bruits, or masses Cardiac: RRR; no murmurs, rubs, or gallops,no edema  Respiratory:  clear to auscultation bilaterally, normal work of breathing GI: soft, nontender, nondistended, + BS MS: no deformity or atrophy  Skin: warm and dry, no  rash Neuro:  Strength and sensation are intact Psych: euthymic mood, full affect    Recent Labs: No results found for requested labs within last 8760 hours.    Lipid Panel No results found for: CHOL, HDL, LDLCALC, TRIG    Wt Readings from Last 3 Encounters:  04/26/16 207 lb 4 oz (94 kg)  03/14/16 210 lb 8 oz (95.5 kg)  02/16/16 207 lb 8 oz (94.1 kg)       ASSESSMENT AND PLAN:  Paroxysmal atrial fibrillation (HCC) - Plan: EKG 12-Lead, ECHOCARDIOGRAM COMPLETE Long discussion with him concerning paroxysmal atrial fibrillation, causes, treatment options, various medications Discussed all above Chads vasc of at least 2, soon to be 3 Recommended anticoagulation. After long discussion he is willing to start Xarelto 20 mg daily.  For rate and rhythm control will start metoprolol succinate 25 mg daily  Preventative health care Recommended smoking cessation, cholesterol control given PAD  Aortic atherosclerosis (HCC)  at least moderate disease seen on CT scan with him  Stressed importance of aggressive lipid management   Smoking history  0.5 year smoking history, likely responsible for his aortic atherosclerosis Mixed hyperlipidemia  Chronic fatigue Etiology of his fatigue is unclear  Recommended a regular exercise program   Abdominal aortic aneurysm (AAA) without rupture (HCC) Mildly dilated aorta seen on CT scan in 2015  Recent ultrasound result did not show much progression   PAD Likely secondary to long-standing smoking. Denies any claudication symptoms  Hyperlipidemia No new lab work available for review Goal LDL less than 70   Total encounter time more than 45 minutes  Greater than 50% was spent in counseling and coordination of care with the patient   Disposition:   F/U  6 months   Orders Placed This Encounter  Procedures  . EKG 12-Lead  . ECHOCARDIOGRAM COMPLETE     Signed, Esmond Plants, M.D., Ph.D. 04/26/2016  [place],  [place]

## 2016-04-27 ENCOUNTER — Telehealth: Payer: Self-pay | Admitting: Cardiovascular Disease

## 2016-04-27 ENCOUNTER — Ambulatory Visit: Payer: Medicare Other | Admitting: Cardiovascular Disease

## 2016-04-27 NOTE — Telephone Encounter (Signed)
Needs 90 authorization for Toprol-XL. Reference # [id number]

## 2016-04-27 NOTE — Telephone Encounter (Signed)
Clarified rx w/ [facility]. They will update his file and send in #90.

## 2016-05-11 DIAGNOSIS — D485 Neoplasm of uncertain behavior of skin: Secondary | ICD-10-CM | POA: Diagnosis not present

## 2016-05-11 DIAGNOSIS — L239 Allergic contact dermatitis, unspecified cause: Secondary | ICD-10-CM | POA: Diagnosis not present

## 2016-05-11 DIAGNOSIS — C44329 Squamous cell carcinoma of skin of other parts of face: Secondary | ICD-10-CM | POA: Diagnosis not present

## 2016-05-22 ENCOUNTER — Ambulatory Visit (INDEPENDENT_AMBULATORY_CARE_PROVIDER_SITE_OTHER): Payer: Medicare Other

## 2016-05-22 ENCOUNTER — Other Ambulatory Visit: Payer: Self-pay

## 2016-05-22 DIAGNOSIS — I480 Paroxysmal atrial fibrillation: Secondary | ICD-10-CM | POA: Diagnosis not present

## 2016-05-31 ENCOUNTER — Ambulatory Visit: Payer: Medicare Other | Admitting: Podiatry

## 2016-07-04 DIAGNOSIS — L905 Scar conditions and fibrosis of skin: Secondary | ICD-10-CM | POA: Diagnosis not present

## 2016-07-04 DIAGNOSIS — C44329 Squamous cell carcinoma of skin of other parts of face: Secondary | ICD-10-CM | POA: Diagnosis not present

## 2016-07-13 ENCOUNTER — Ambulatory Visit (INDEPENDENT_AMBULATORY_CARE_PROVIDER_SITE_OTHER): Payer: Medicare Other | Admitting: Primary Care

## 2016-07-13 ENCOUNTER — Encounter: Payer: Self-pay | Admitting: Primary Care

## 2016-07-13 DIAGNOSIS — Z136 Encounter for screening for cardiovascular disorders: Secondary | ICD-10-CM | POA: Diagnosis not present

## 2016-07-13 DIAGNOSIS — K219 Gastro-esophageal reflux disease without esophagitis: Secondary | ICD-10-CM

## 2016-07-13 DIAGNOSIS — E782 Mixed hyperlipidemia: Secondary | ICD-10-CM | POA: Diagnosis not present

## 2016-07-13 DIAGNOSIS — I480 Paroxysmal atrial fibrillation: Secondary | ICD-10-CM | POA: Diagnosis not present

## 2016-07-13 DIAGNOSIS — G8929 Other chronic pain: Secondary | ICD-10-CM | POA: Insufficient documentation

## 2016-07-13 DIAGNOSIS — M722 Plantar fascial fibromatosis: Secondary | ICD-10-CM | POA: Diagnosis not present

## 2016-07-13 DIAGNOSIS — M79673 Pain in unspecified foot: Secondary | ICD-10-CM

## 2016-07-13 DIAGNOSIS — M1990 Unspecified osteoarthritis, unspecified site: Secondary | ICD-10-CM | POA: Insufficient documentation

## 2016-07-13 NOTE — Progress Notes (Signed)
Subjective:    Patient ID: Adam Juarez, male    DOB: 1940-08-04, 77 y.o.   MRN: [id number]  HPI  Adam Juarez is a 76 year old male who presents today to transfer care from [facility].  1) Paroxysmal Atrial Fibrillation/SVT: Currently managed on rivaroban 20 mg daily, Toprol XL 25 mg. He currently follows with Dr. Rockey Situ through cardiology every six months. He denies palpitations, chest pain, dizziness.  2) GERD: Currently managed on Omeprazole 20 mg. His symptoms include belching and epigastric discomfort for which he experiences daily. He will take Tums once to twice daily with improvement. He was once on a higher dose of omeprazole, but this was decreased by PCP in [place].  3) Hyperlipidemia: Currently managed on co-enzyme Q10. His last Lipid panel with elevation in triglycerides. He's had no recent recheck. He is sedentary and endorses a fair diet.  4) Sore Feet: Located to bilateral plantar feet for the past several years. He wakes up with this pain every morning for which he describes sore, achy, with numbness to his toes. He has a history of plantar fasciitis and just re-started his exercises. He cannot take NSAID's due to cardiac condition.  5) Arthralgias: Pain to his bilateral hips and thighs for the past several years. He describes his discomfort as a pull/stiffness. He will feel these symptoms when rising from a sitting position after rest for a period of time. He does not currently exercise. Prior to his move to [place] he was cycling daily. He denies recent injury/trauma, numbness/tingling to his lower extremities, low back pain.   6) AAA: Abdominal aortic aneurysm measuring 3.8 in 02/2016 which was an increase from 3.3 compared to prior reading. He is due for repeat check in 08-30-17. His father passed away from a ruptured AAA at age 41. He is a prior smoker.  Review of Systems  Respiratory: Negative for shortness of breath.   Cardiovascular: Negative for chest pain and  palpitations.  Gastrointestinal:       Belching, esophageal reflux  Musculoskeletal: Positive for arthralgias and myalgias.  Neurological: Negative for dizziness and weakness.       Past Medical History:  Diagnosis Date  . Arthritis    fingers  . 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 Chicken pox   . Dysrhythmia 09/2013   Hx. a-fib x 1 episode patient states he "auto corrected" seen at [facility]  . GERD (gastroesophageal reflux disease)   . Headache    poor posture - none recently  . PSVT (paroxysmal supraventricular tachycardia) ([place]) 08/31/2007   Controlled with "breathing process"  . Renal stone 9/08, 6/15  . Wears dentures    full upper     Social History   Social History  . Marital status: Married    Spouse name: N/A  . Number of children: N/A  . Years of education: N/A   Occupational History  . Not on file.   Social History Main Topics  . Smoking status: Former Smoker    Packs/day: 0.50    Years: 38.00    Types: Cigarettes    Quit date: 07/13/1997  . Smokeless tobacco: Never Used  . Alcohol use No  . Drug use: No  . Sexual activity: Not on file   Other Topics Concern  . Not on file   Social History Narrative  . No narrative on file    Past Surgical History:  Procedure Laterality Date  . CARDIAC CATHETERIZATION  August 31, 2006  [address], [place]  . CATARACT EXTRACTION W/PHACO Left 07/27/2015   Procedure: CATARACT EXTRACTION PHACO AND INTRAOCULAR LENS PLACEMENT (IOC);  Surgeon: Leandrew Koyanagi, MD;  Location: [place];  Service: Ophthalmology;  Laterality: Left;  TORIC  . CATARACT EXTRACTION W/PHACO Right 08/24/2015   Procedure: CATARACT EXTRACTION PHACO AND INTRAOCULAR LENS PLACEMENT (IOC);  Surgeon: Leandrew Koyanagi, MD;  Location: [place];  Service: Ophthalmology;  Laterality: Right;  TORIC  . HEMORROIDECTOMY  2010   banding  . KIDNEY STONE SURGERY  9/08, 6/15   lithotripsy  . SKIN CANCER EXCISION    . VASECTOMY        Family History  Problem Relation Age of Onset  . AAA (abdominal aortic aneurysm) Father   . Parkinson's disease Mother   . Arthritis Mother     Allergies  Allergen Reactions  . Other Anaphylaxis and Hives    BEE STINGS    Current Outpatient Prescriptions on File Prior to Visit  Medication Sig Dispense Refill  . Ascorbic Acid (VITAMIN C PO) Take by mouth daily.    . Coenzyme Q10 (COQ10) 200 MG CAPS Take 200 mg by mouth daily.    . Cyanocobalamin (VITAMIN B 12 PO) Take 1 tablet by mouth daily.    . Magnesium 500 MG CAPS Take 500 mg by mouth daily.    . metoprolol succinate (TOPROL-XL) 25 MG 24 hr tablet Take 1 tablet (25 mg total) by mouth daily. Take with or immediately following a meal. 30 tablet 6  . Misc Natural Products (GLUCOSAMINE CHONDROITIN ADV PO) Take 1 tablet by mouth daily.    . Multiple Vitamins-Minerals (PRESERVISION AREDS 2 PO) Take by mouth 2 (two) times daily.    Marland Kitchen omeprazole (PRILOSEC) 20 MG capsule Take 20 mg by mouth daily.    . polyvinyl alcohol (LIQUIFILM TEARS) 1.4 % ophthalmic solution Place 1 drop into both eyes as needed for dry eyes.    . Probiotic Product (PROBIOTIC DAILY PO) Take by mouth.    . rivaroxaban (XARELTO) 20 MG TABS tablet Take 1 tablet (20 mg total) by mouth daily with supper. 90 tablet 3   No current facility-administered medications on file prior to visit.     BP 122/80   Pulse (!) 59   Temp 97.9 F (36.6 C) (Oral)   Ht 5\' 10"  (1.778 m)   Wt 210 lb 6.4 oz (95.4 kg)   SpO2 94%   BMI 30.19 kg/m    Objective:   Physical Exam  Constitutional: He is oriented to person, place, and time. He appears well-nourished.  Neck: Neck supple.  Cardiovascular: Normal rate and regular rhythm.   Pulmonary/Chest: Effort normal and breath sounds normal. He has no wheezes. He has no rales.  Musculoskeletal: Normal range of motion.  Neurological: He is alert and oriented to person, place, and time.  Skin: Skin is warm and dry.  Psychiatric:  He has a normal mood and affect.          Assessment & Plan:

## 2016-07-13 NOTE — Progress Notes (Signed)
Pre visit review using our clinic review tool, if applicable. No additional management support is needed unless otherwise documented below in the visit note. 

## 2016-07-13 NOTE — Assessment & Plan Note (Signed)
No recent panel on file, he will return fasting for labs. Continue Co-Q10. Discussed importance of exercise.

## 2016-07-13 NOTE — Assessment & Plan Note (Signed)
Noted on most recent xray. Suspect hip pain related, especially given symptoms mentioned in HPI. Discussed use of tylenol and importance of regular exercise.  Consider PT.

## 2016-07-13 NOTE — Assessment & Plan Note (Signed)
Uncontrolled on PPI. Will have him add in Zantac HS for 4 weeks, then stop. Continue omeprazole 20 mg. Discussed triggers.

## 2016-07-13 NOTE — Assessment & Plan Note (Signed)
Symptoms of bilateral plantar foot pain sound to be this. Discussed to continue exercises.

## 2016-07-13 NOTE — Assessment & Plan Note (Signed)
Regular rate and rhythm today. Continue anticoagulation and beta blocker.

## 2016-07-13 NOTE — Assessment & Plan Note (Signed)
Reviewed screening from 2017, due again in 2019.

## 2016-07-13 NOTE — Patient Instructions (Addendum)
Acid Reflux:  Continue Omeprazole 20 mg in the morning. Start ranitidine (Zantac) 150 mg once every evening. Do this for four weeks then stop the ranitidine. Please update me if you find no improvement in symptoms with this combination.  Foot Pain:  Continue foot exercises for plantar fasciitis. Consider purchasing insoles for your regular shoes.  Arthritis:  The pain to your hips sounds to be arthritis. Regular exercise will improve this discomfort. Occasional use of ibuprofen or tylenol may help. Sitting for prolonged periods of time will cause this to become worse.  Schedule a lab only appointment at your convenience to return for fasting cholesterol and electrolytes.   It was a pleasure to meet you today! Please don't hesitate to call me with any questions. Welcome to [facility]!

## 2016-07-17 ENCOUNTER — Other Ambulatory Visit: Payer: Self-pay | Admitting: Primary Care

## 2016-07-17 DIAGNOSIS — E782 Mixed hyperlipidemia: Secondary | ICD-10-CM

## 2016-07-18 ENCOUNTER — Other Ambulatory Visit (INDEPENDENT_AMBULATORY_CARE_PROVIDER_SITE_OTHER): Payer: Medicare Other

## 2016-07-18 DIAGNOSIS — E782 Mixed hyperlipidemia: Secondary | ICD-10-CM

## 2016-07-18 LAB — BASIC METABOLIC PANEL
BUN: 18 mg/dL (ref 6–23)
CALCIUM: 9.8 mg/dL (ref 8.4–10.5)
CO2: 28 mEq/L (ref 19–32)
Chloride: 101 mEq/L (ref 96–112)
Creatinine, Ser: 0.89 mg/dL (ref 0.40–1.50)
GFR: 88.68 mL/min (ref >60.00)
Glucose, Bld: 110 mg/dL — ABNORMAL HIGH (ref 70–99)
Potassium: 4.3 mEq/L (ref 3.5–5.1)
SODIUM: 137 meq/L (ref 135–145)

## 2016-07-18 LAB — LDL CHOLESTEROL, DIRECT: Direct LDL: 50 mg/dL

## 2016-07-18 LAB — LIPID PANEL
Cholesterol: 158 mg/dL (ref 0–200)
HDL: 29.4 mg/dL — ABNORMAL LOW (ref >39.00)
Total CHOL/HDL Ratio: 5

## 2016-08-03 ENCOUNTER — Other Ambulatory Visit: Payer: Self-pay | Admitting: Primary Care

## 2016-08-03 ENCOUNTER — Encounter: Payer: Self-pay | Admitting: Primary Care

## 2016-08-03 ENCOUNTER — Ambulatory Visit (INDEPENDENT_AMBULATORY_CARE_PROVIDER_SITE_OTHER): Payer: Medicare Other | Admitting: Primary Care

## 2016-08-03 VITALS — BP 126/78 | HR 72 | Temp 97.7°F | Ht 70.0 in | Wt 211.0 lb

## 2016-08-03 DIAGNOSIS — M79672 Pain in left foot: Secondary | ICD-10-CM | POA: Diagnosis not present

## 2016-08-03 DIAGNOSIS — M79671 Pain in right foot: Secondary | ICD-10-CM | POA: Diagnosis not present

## 2016-08-03 DIAGNOSIS — M722 Plantar fascial fibromatosis: Secondary | ICD-10-CM | POA: Diagnosis not present

## 2016-08-03 DIAGNOSIS — R21 Rash and other nonspecific skin eruption: Secondary | ICD-10-CM

## 2016-08-03 MED ORDER — TRIAMCINOLONE ACETONIDE 0.1 % EX CREA: 1.0000 "application " | TOPICAL_CREAM | Freq: Two times a day (BID) | CUTANEOUS | 0 refills | Status: DC

## 2016-08-03 NOTE — Progress Notes (Signed)
Subjective:    Patient ID: Adam Juarez, male    DOB: [birth year], 76 y.o.   MRN: [id number]  HPI  Mr. Genova is a 76 year old male with a history of osteoarthritis and chronic plantar fasciitis who presents today with a chief complaint of foot pain.  His foot pain is located to the plantar feet bilaterally. Over the past 1-2 weeks he's noticed increased pain with a "swollen" feeling to the bottom. He describes his pain as burning that is different than his plantar fasciitis. His toes feel numb occasionally that has been present for the past 1 month. He does wear insoles to his shoes intermittently. He's tried exercises with tennis balls, plantar exercises. He's taken tylenol with some improvement.   He also reports left great toe pain since a recent fall. He fell one week ago landing face forward on the ground. Shortly after he noticed that his left great toe nail had been pulled upward. He's noticed bruising under the toenail and to the left medial border since. He denies fevers, drainage, increased redness/swelling.   Review of Systems  Constitutional: Negative for fever.  Musculoskeletal:       Bilateral plantar foot pain  Skin: Positive for color change and wound.       Toe nail pain and dislocation       Past Medical History:  Diagnosis Date  . Arthritis    fingers  . 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 Chicken pox   . Dysrhythmia 09/2013   Hx. a-fib x 1 episode patient states he "auto corrected" seen at [facility]  . GERD (gastroesophageal reflux disease)   . Headache    poor posture - none recently  . PSVT (paroxysmal supraventricular tachycardia) ([place]) 2009   Controlled with "breathing process"  . Renal stone 9/08, 6/15  . Wears dentures    full upper     Social History   Social History  . Marital status: Married    Spouse name: N/A  . Number of children: N/A  . Years of education: N/A   Occupational History  . Not on file.   Social  History Main Topics  . Smoking status: Former Smoker    Packs/day: 0.50    Years: 38.00    Types: Cigarettes    Quit date: 07/13/1997  . Smokeless tobacco: Never Used  . Alcohol use No  . Drug use: No  . Sexual activity: Not on file   Other Topics Concern  . Not on file   Social History Narrative  . No narrative on file    Past Surgical History:  Procedure Laterality Date  . CARDIAC CATHETERIZATION  [address] [place]  . CATARACT EXTRACTION W/PHACO Left 07/27/2015   Procedure: CATARACT EXTRACTION PHACO AND INTRAOCULAR LENS PLACEMENT (IOC);  Surgeon: Leandrew Koyanagi, MD;  Location: [place];  Service: Ophthalmology;  Laterality: Left;  TORIC  . CATARACT EXTRACTION W/PHACO Right 08/24/2015   Procedure: CATARACT EXTRACTION PHACO AND INTRAOCULAR LENS PLACEMENT (IOC);  Surgeon: Leandrew Koyanagi, MD;  Location: [place];  Service: Ophthalmology;  Laterality: Right;  TORIC  . HEMORROIDECTOMY  2010   banding  . KIDNEY STONE SURGERY  9/08, 6/15   lithotripsy  . SKIN CANCER EXCISION    . VASECTOMY      Family History  Problem Relation Age of Onset  . AAA (abdominal aortic aneurysm) Father   . Parkinson's disease Mother   . Arthritis Mother  Allergies  Allergen Reactions  . Other Anaphylaxis and Hives    BEE STINGS    Current Outpatient Prescriptions on File Prior to Visit  Medication Sig Dispense Refill  . Coenzyme Q10 (COQ10) 200 MG CAPS Take 200 mg by mouth daily.    . metoprolol succinate (TOPROL-XL) 25 MG 24 hr tablet Take 1 tablet (25 mg total) by mouth daily. Take with or immediately following a meal. 30 tablet 6  . Misc Natural Products (GLUCOSAMINE CHONDROITIN ADV PO) Take 1 tablet by mouth daily.    Marland Kitchen omeprazole (PRILOSEC) 20 MG capsule Take 20 mg by mouth daily.    . polyvinyl alcohol (LIQUIFILM TEARS) 1.4 % ophthalmic solution Place 1 drop into both eyes as needed for dry eyes.    . rivaroxaban (XARELTO) 20 MG TABS tablet Take 1  tablet (20 mg total) by mouth daily with supper. 90 tablet 3  . Ascorbic Acid (VITAMIN C PO) Take by mouth daily.    . Cyanocobalamin (VITAMIN B 12 PO) Take 1 tablet by mouth daily.    . Magnesium 500 MG CAPS Take 500 mg by mouth daily.    . Multiple Vitamins-Minerals (PRESERVISION AREDS 2 PO) Take by mouth 2 (two) times daily.    . Probiotic Product (PROBIOTIC DAILY PO) Take by mouth.     No current facility-administered medications on file prior to visit.     BP 126/78   Pulse 72   Temp 97.7 F (36.5 C)   Ht 5\' 10"  (1.778 m)   Wt 211 lb (95.7 kg)   SpO2 97%   BMI 30.28 kg/m    Objective:   Physical Exam  Constitutional: He appears well-nourished.  Neck: Neck supple.  Cardiovascular:  Pulses:      Dorsalis pedis pulses are 2+ on the right side, and 2+ on the left side.       Posterior tibial pulses are 2+ on the right side, and 2+ on the left side.  Musculoskeletal:  Tender to right plantar surface, mostly to medial arch and mid foot.   Skin: Skin is warm and dry.  Left great toe nail secured onto nail bed, but nail obviously dislocated upward. No s/s of acute infection.  1 cm, circular rash to right thoracic back. Mild scaling.          Assessment & Plan:  Toe Pain:  Dislocation of toe nail of great nail on left side s/p fall 1 week ago. Unstable nail without s/s of acute infection. Referral placed to podiatry for further evaluation of plantar fasciitis, will have them evaluate. Discussed home care instructions.  Rash:  Located to right thoracic back.  Typically uses cortisone OTC, no improvement.  Does have Rx strength cream that he's used irregularly. This helps with itch, no resolve.  Will have him message me with name and dose of cream. May need higher potency. Looks like eczema type rash.

## 2016-08-03 NOTE — Patient Instructions (Signed)
Stop by the front desk and speak with either Rosaria Ferries or Shirlean Mylar regarding your referral to Podiatry.  Please message me the name of your prescription strength cortisone cream. Try applying to the site twice daily for 1 week.  It was a pleasure to see you today!

## 2016-08-03 NOTE — Assessment & Plan Note (Signed)
Suspect this to be the cause of his bilateral foot pain. He is inactive as he sits in a recliner elevating his feet for most of the day. Since he's failed conservative treatment, will send to podiatry for further evaluation.

## 2016-08-07 DIAGNOSIS — M7751 Other enthesopathy of right foot: Secondary | ICD-10-CM | POA: Diagnosis not present

## 2016-08-07 DIAGNOSIS — M7752 Other enthesopathy of left foot: Secondary | ICD-10-CM | POA: Diagnosis not present

## 2016-09-17 DIAGNOSIS — M7752 Other enthesopathy of left foot: Secondary | ICD-10-CM | POA: Diagnosis not present

## 2016-09-17 DIAGNOSIS — M7751 Other enthesopathy of right foot: Secondary | ICD-10-CM | POA: Diagnosis not present

## 2016-09-21 DIAGNOSIS — H353132 Nonexudative age-related macular degeneration, bilateral, intermediate dry stage: Secondary | ICD-10-CM | POA: Diagnosis not present

## 2016-10-04 DIAGNOSIS — X32XXXA Exposure to sunlight, initial encounter: Secondary | ICD-10-CM | POA: Diagnosis not present

## 2016-10-04 DIAGNOSIS — C4441 Basal cell carcinoma of skin of scalp and neck: Secondary | ICD-10-CM | POA: Diagnosis not present

## 2016-10-04 DIAGNOSIS — L570 Actinic keratosis: Secondary | ICD-10-CM | POA: Diagnosis not present

## 2016-10-04 DIAGNOSIS — L821 Other seborrheic keratosis: Secondary | ICD-10-CM | POA: Diagnosis not present

## 2016-10-04 DIAGNOSIS — D485 Neoplasm of uncertain behavior of skin: Secondary | ICD-10-CM | POA: Diagnosis not present

## 2016-10-04 DIAGNOSIS — Z85828 Personal history of other malignant neoplasm of skin: Secondary | ICD-10-CM | POA: Diagnosis not present

## 2016-10-04 DIAGNOSIS — D1801 Hemangioma of skin and subcutaneous tissue: Secondary | ICD-10-CM | POA: Diagnosis not present

## 2016-10-04 DIAGNOSIS — Z08 Encounter for follow-up examination after completed treatment for malignant neoplasm: Secondary | ICD-10-CM | POA: Diagnosis not present

## 2016-10-31 ENCOUNTER — Encounter: Payer: Self-pay | Admitting: Primary Care

## 2016-10-31 ENCOUNTER — Ambulatory Visit (INDEPENDENT_AMBULATORY_CARE_PROVIDER_SITE_OTHER): Payer: Medicare Other | Admitting: Primary Care

## 2016-10-31 VITALS — BP 146/92 | HR 70 | Temp 98.1°F | Ht 70.0 in | Wt 206.0 lb

## 2016-10-31 DIAGNOSIS — M62830 Muscle spasm of back: Secondary | ICD-10-CM

## 2016-10-31 MED ORDER — METHOCARBAMOL 500 MG PO TABS: 500.0000 mg | ORAL_TABLET | Freq: Three times a day (TID) | ORAL | 0 refills | Status: DC | PRN

## 2016-10-31 NOTE — Patient Instructions (Signed)
Try methocarbamol 500 mg tablets for muscle spasms of the back. Take 1 tablet by mouth every 8 hours as needed. Caution as this medication may cause drowsiness.  You may use tylenol as needed for pain.  Work on stretching and refrain from prolonged stagnation.  It was a pleasure to see you today!   Muscle Cramps and Spasms Muscle cramps and spasms occur when a muscle or muscles tighten and you have no control over this tightening (involuntary muscle contraction). They are a common problem and can develop in any muscle. The most common place is in the calf muscles of the leg. Muscle cramps and muscle spasms are both involuntary muscle contractions, but there are some differences between the two:  Muscle cramps are painful. They come and go and may last a few seconds to 15 minutes. Muscle cramps are often more forceful and last longer than muscle spasms.  Muscle spasms may or may not be painful. They may also last just a few seconds or much longer.  Certain medical conditions, such as diabetes or Parkinson disease, can make it more likely to develop cramps or spasms. However, cramps or spasms are usually not caused by a serious underlying problem. Common causes include:  Overexertion.  Overuse from repetitive motions, or doing the same thing over and over.  Remaining in a certain position for a long period of time.  Improper preparation, form, or technique while playing a sport or doing an activity.  Dehydration.  Injury.  Side effects of some medicines.  Abnormally low levels of the salts and ions in your blood (electrolytes), especially potassium and calcium. This could happen if you are taking water pills (diuretics) or if you are pregnant.  In many cases, the cause of muscle cramps or spasms is unknown. Follow these instructions at home:  Stay well hydrated. Drink enough fluid to keep your urine clear or pale yellow.  Try massaging, stretching, and relaxing the affected  muscle.  If directed, apply heat to tight or tense muscles as often as told by your health care provider. Use the heat source that your health care provider recommends, such as a moist heat pack or a heating pad. ? Place a towel between your skin and the heat source. ? Leave the heat on for 20-30 minutes. ? Remove the heat if your skin turns bright red. This is especially important if you are unable to feel pain, heat, or cold. You may have a greater risk of getting burned.  If directed, put ice on the affected area. This may help if you are sore or have pain after a cramp or spasm. ? Put ice in a plastic bag. ? Place a towel between your skin and the bag. ? Leavethe ice on for 20 minutes, 2-3 times a day.  Take over-the-counter and prescription medicines only as told by your health care provider.  Pay attention to any changes in your symptoms. Contact a health care provider if:  Your cramps or spasms get more severe or happen more often.  Your cramps or spasms do not improve over time. This information is not intended to replace advice given to you by your health care provider. Make sure you discuss any questions you have with your health care provider. Document Released: 10/20/2001 Document Revised: 06/01/2015 Document Reviewed: 02/01/2015 Elsevier Interactive Patient Education  2018 [employer].

## 2016-10-31 NOTE — Progress Notes (Signed)
Subjective:    Patient ID: Adam Juarez, male    DOB: 14-Apr-1941, 76 y.o.   MRN: [id number]  HPI  Adam Juarez is a 76 year old male with a history of back pain who presents today with a chief complaint of back pain.   His pain is located right lower back that has been present intermittently for years. Over the last 4 days he's noticed increased pain to the right lower back. One week prior he was working in the yard. He denies recent trauma/injury, numbness/tingling, radiation of pain to his right lower extremity. He describes his pain as a "dull" ache that is constant, with intermittent muscle cramping several times daily with movement and rest. He's taken tylenol without much improvement. He is on Xarelto for atrial fibrillation.  Review of Systems  Musculoskeletal: Positive for back pain and myalgias. Negative for joint swelling.  Skin: Negative for color change.  Neurological: Negative for weakness and numbness.       Past Medical History:  Diagnosis Date  . Arthritis    fingers  . 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 Chicken pox   . Dysrhythmia 09/2013   Hx. a-fib x 1 episode patient states he "auto corrected" seen at [facility]  . GERD (gastroesophageal reflux disease)   . Headache    poor posture - none recently  . PSVT (paroxysmal supraventricular tachycardia) ([place]) 2009   Controlled with "breathing process"  . Renal stone 9/08, 6/15  . Wears dentures    full upper     Social History   Social History  . Marital status: Married    Spouse name: N/A  . Number of children: N/A  . Years of education: N/A   Occupational History  . Not on file.   Social History Main Topics  . Smoking status: Former Smoker    Packs/day: 0.50    Years: 38.00    Types: Cigarettes    Quit date: 07/13/1997  . Smokeless tobacco: Never Used  . Alcohol use No  . Drug use: No  . Sexual activity: Not on file   Other Topics Concern  . Not on file   Social History  Narrative  . No narrative on file    Past Surgical History:  Procedure Laterality Date  . CARDIAC CATHETERIZATION  [address] [place]  . CATARACT EXTRACTION W/PHACO Left 07/27/2015   Procedure: CATARACT EXTRACTION PHACO AND INTRAOCULAR LENS PLACEMENT (IOC);  Surgeon: Leandrew Koyanagi, MD;  Location: [place];  Service: Ophthalmology;  Laterality: Left;  TORIC  . CATARACT EXTRACTION W/PHACO Right 08/24/2015   Procedure: CATARACT EXTRACTION PHACO AND INTRAOCULAR LENS PLACEMENT (IOC);  Surgeon: Leandrew Koyanagi, MD;  Location: [place];  Service: Ophthalmology;  Laterality: Right;  TORIC  . HEMORROIDECTOMY  2010   banding  . KIDNEY STONE SURGERY  9/08, 6/15   lithotripsy  . SKIN CANCER EXCISION    . VASECTOMY      Family History  Problem Relation Age of Onset  . AAA (abdominal aortic aneurysm) Father   . Parkinson's disease Mother   . Arthritis Mother     Allergies  Allergen Reactions  . Other Anaphylaxis and Hives    BEE STINGS BEE STINGS BEE STINGS    Current Outpatient Prescriptions on File Prior to Visit  Medication Sig Dispense Refill  . Ascorbic Acid (VITAMIN C PO) Take by mouth daily.    . Coenzyme Q10 (COQ10) 200 MG CAPS Take  200 mg by mouth daily.    . Magnesium 500 MG CAPS Take 500 mg by mouth daily.    . metoprolol succinate (TOPROL-XL) 25 MG 24 hr tablet Take 1 tablet (25 mg total) by mouth daily. Take with or immediately following a meal. 30 tablet 6  . Misc Natural Products (GLUCOSAMINE CHONDROITIN ADV PO) Take 1 tablet by mouth daily.    . Multiple Vitamins-Minerals (PRESERVISION AREDS 2 PO) Take by mouth 2 (two) times daily.    Marland Kitchen omeprazole (PRILOSEC) 20 MG capsule Take 20 mg by mouth daily.    . polyvinyl alcohol (LIQUIFILM TEARS) 1.4 % ophthalmic solution Place 1 drop into both eyes as needed for dry eyes.    . Probiotic Product (PROBIOTIC DAILY PO) Take by mouth.    . rivaroxaban (XARELTO) 20 MG TABS tablet Take 1 tablet  (20 mg total) by mouth daily with supper. 90 tablet 3  . triamcinolone cream (KENALOG) 0.1 % Apply 1 application topically 2 (two) times daily. 30 g 0  . Cyanocobalamin (VITAMIN B 12 PO) Take 1 tablet by mouth daily.     No current facility-administered medications on file prior to visit.     BP (!) 146/92   Pulse 70   Temp 98.1 F (36.7 C) (Oral)   Ht 5\' 10"  (1.778 m)   Wt 206 lb (93.4 kg)   SpO2 98%   BMI 29.56 kg/m    Objective:   Physical Exam  Constitutional: He appears well-nourished.  Neck: Neck supple.  Cardiovascular: Normal rate.   Pulmonary/Chest: Effort normal.  Musculoskeletal:  Decrease in ROM to lower back. Knot to right lower back around L1 just lateral to lumbar spine. Tension noted to T8-9 just lateral to spine.  Skin: Skin is warm and dry.          Assessment & Plan:  Muscle Spasm:  Lower back pain with tightness x 4 days. Exam today representative of muscle spasm. Rx for Robaxin course sent to pharmacy. Discussed ice/heat, stretching, avoid stagnation. Follow up PRN.  Sheral Flow, NP

## 2016-11-11 ENCOUNTER — Encounter: Payer: Self-pay | Admitting: Primary Care

## 2016-11-11 DIAGNOSIS — M62830 Muscle spasm of back: Secondary | ICD-10-CM

## 2016-11-12 MED ORDER — TIZANIDINE HCL 4 MG PO CAPS: 4.0000 mg | ORAL_CAPSULE | Freq: Three times a day (TID) | ORAL | 0 refills | Status: DC | PRN

## 2016-11-13 MED ORDER — TIZANIDINE HCL 4 MG PO CAPS: 4.0000 mg | ORAL_CAPSULE | Freq: Three times a day (TID) | ORAL | 0 refills | Status: DC | PRN

## 2016-11-13 NOTE — Telephone Encounter (Signed)
Re-send the Zanaflex to [facility] as requested from patient.

## 2016-11-13 NOTE — Addendum Note (Signed)
Addended by: Jacqualin Combes on: 11/13/2016 03:24 PM   Modules accepted: Orders

## 2016-11-29 DIAGNOSIS — L538 Other specified erythematous conditions: Secondary | ICD-10-CM | POA: Diagnosis not present

## 2016-11-29 DIAGNOSIS — C4441 Basal cell carcinoma of skin of scalp and neck: Secondary | ICD-10-CM | POA: Diagnosis not present

## 2016-11-29 DIAGNOSIS — L905 Scar conditions and fibrosis of skin: Secondary | ICD-10-CM | POA: Diagnosis not present

## 2016-11-29 DIAGNOSIS — L820 Inflamed seborrheic keratosis: Secondary | ICD-10-CM | POA: Diagnosis not present

## 2017-01-09 ENCOUNTER — Other Ambulatory Visit: Payer: Self-pay | Admitting: Primary Care

## 2017-01-09 DIAGNOSIS — E785 Hyperlipidemia, unspecified: Secondary | ICD-10-CM

## 2017-01-09 DIAGNOSIS — I10 Essential (primary) hypertension: Secondary | ICD-10-CM

## 2017-01-09 DIAGNOSIS — R739 Hyperglycemia, unspecified: Secondary | ICD-10-CM

## 2017-01-10 ENCOUNTER — Ambulatory Visit (INDEPENDENT_AMBULATORY_CARE_PROVIDER_SITE_OTHER): Payer: Medicare Other

## 2017-01-10 VITALS — BP 138/84 | HR 63 | Temp 98.1°F | Ht 69.5 in | Wt 209.8 lb

## 2017-01-10 DIAGNOSIS — E785 Hyperlipidemia, unspecified: Secondary | ICD-10-CM | POA: Diagnosis not present

## 2017-01-10 DIAGNOSIS — Z0000 Encounter for general adult medical examination without abnormal findings: Secondary | ICD-10-CM | POA: Diagnosis not present

## 2017-01-10 DIAGNOSIS — R739 Hyperglycemia, unspecified: Secondary | ICD-10-CM

## 2017-01-10 DIAGNOSIS — Z23 Encounter for immunization: Secondary | ICD-10-CM | POA: Diagnosis not present

## 2017-01-10 DIAGNOSIS — I10 Essential (primary) hypertension: Secondary | ICD-10-CM | POA: Diagnosis not present

## 2017-01-10 LAB — COMPREHENSIVE METABOLIC PANEL
ALT: 16 U/L (ref 0–53)
AST: 19 U/L (ref 0–37)
Albumin: 4.2 g/dL (ref 3.5–5.2)
Alkaline Phosphatase: 61 U/L (ref 39–117)
BILIRUBIN TOTAL: 0.6 mg/dL (ref 0.2–1.2)
BUN: 16 mg/dL (ref 6–23)
CALCIUM: 9.4 mg/dL (ref 8.4–10.5)
CHLORIDE: 105 meq/L (ref 96–112)
CO2: 25 meq/L (ref 19–32)
CREATININE: 0.86 mg/dL (ref 0.40–1.50)
GFR: 92.14 mL/min (ref >60.00)
GLUCOSE: 117 mg/dL — AB (ref 70–99)
Potassium: 4 mEq/L (ref 3.5–5.1)
Sodium: 139 mEq/L (ref 135–145)
Total Protein: 7.1 g/dL (ref 6.0–8.3)

## 2017-01-10 LAB — LDL CHOLESTEROL, DIRECT: Direct LDL: 52 mg/dL

## 2017-01-10 LAB — LIPID PANEL
CHOLESTEROL: 142 mg/dL (ref 0–200)
HDL: 28.4 mg/dL — ABNORMAL LOW (ref >39.00)
NONHDL: 114.01
TRIGLYCERIDES: 327 mg/dL — AB (ref 0.0–149.0)
Total CHOL/HDL Ratio: 5
VLDL: 65.4 mg/dL — AB (ref 0.0–40.0)

## 2017-01-10 LAB — HEMOGLOBIN A1C: Hgb A1c MFr Bld: 5.9 % (ref 4.6–6.5)

## 2017-01-10 NOTE — Progress Notes (Signed)
PCP notes:   Health maintenance:  Flu vaccine - patient declined PCV13 vaccine - administered  Abnormal screenings:   Fall risk - hx of 2 falls; 1 with injury and medical treatment Depression score: 1  Patient concerns:   None  Nurse concerns:  None  Next PCP appt:   01/15/17 @ 1100

## 2017-01-10 NOTE — Progress Notes (Signed)
Subjective:   Adam Juarez is a 76 y.o. male who presents for an Initial Medicare Annual Wellness Visit.  Review of Systems  N/A Cardiac Risk Factors include: advanced age (>39men, >61 women);obesity (BMI >30kg/m2);male gender;dyslipidemia    Objective:    Today's Vitals   01/10/17 0834  BP: 138/84  Pulse: 63  Temp: 98.1 F (36.7 C)  TempSrc: Oral  SpO2: 95%  Weight: 209 lb 12 oz (95.1 kg)  Height: 5' 9.5" (1.765 m)  PainSc: 0-No pain   Body mass index is 30.53 kg/m.  Current Medications (verified) Outpatient Encounter Prescriptions as of 01/10/2017  Medication Sig  . Ascorbic Acid (VITAMIN C PO) Take by mouth daily.  . Coenzyme Q10 (COQ10) 200 MG CAPS Take 200 mg by mouth daily.  . Cyanocobalamin (VITAMIN B 12 PO) Take 1 tablet by mouth daily.  . Magnesium 500 MG CAPS Take 500 mg by mouth daily.  . metoprolol succinate (TOPROL-XL) 25 MG 24 hr tablet Take 1 tablet (25 mg total) by mouth daily. Take with or immediately following a meal.  . Misc Natural Products (GLUCOSAMINE CHONDROITIN ADV PO) Take 1 tablet by mouth daily.  . Multiple Vitamins-Minerals (PRESERVISION AREDS 2 PO) Take by mouth 2 (two) times daily.  Marland Kitchen omeprazole (PRILOSEC) 20 MG capsule Take 20 mg by mouth daily.  . polyvinyl alcohol (LIQUIFILM TEARS) 1.4 % ophthalmic solution Place 1 drop into both eyes as needed for dry eyes.  . Probiotic Product (PROBIOTIC DAILY PO) Take by mouth.  . rivaroxaban (XARELTO) 20 MG TABS tablet Take 1 tablet (20 mg total) by mouth daily with supper.  Marland Kitchen tiZANidine (ZANAFLEX) 4 MG capsule Take 1 capsule (4 mg total) by mouth 3 (three) times daily as needed for muscle spasms.  Marland Kitchen triamcinolone cream (KENALOG) 0.1 % Apply 1 application topically 2 (two) times daily.  . [DISCONTINUED] methocarbamol (ROBAXIN) 500 MG tablet Take 1 tablet (500 mg total) by mouth every 8 (eight) hours as needed for muscle spasms.   No facility-administered encounter medications on file as of 01/10/2017.      Allergies (verified) Other   History: Past Medical History:  Diagnosis Date  . Arthritis    fingers  . 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 Chicken pox   . Dysrhythmia 09/2013   Hx. a-fib x 1 episode patient states he "auto corrected" seen at [facility]  . GERD (gastroesophageal reflux disease)   . Headache    poor posture - none recently  . PSVT (paroxysmal supraventricular tachycardia) ([place]) 2009   Controlled with "breathing process"  . Renal stone 9/08, 6/15  . Wears dentures    full upper   Past Surgical History:  Procedure Laterality Date  . CARDIAC CATHETERIZATION  [address] [place]  . CATARACT EXTRACTION W/PHACO Left 07/27/2015   Procedure: CATARACT EXTRACTION PHACO AND INTRAOCULAR LENS PLACEMENT (IOC);  Surgeon: Leandrew Koyanagi, MD;  Location: [place];  Service: Ophthalmology;  Laterality: Left;  TORIC  . CATARACT EXTRACTION W/PHACO Right 08/24/2015   Procedure: CATARACT EXTRACTION PHACO AND INTRAOCULAR LENS PLACEMENT (IOC);  Surgeon: Leandrew Koyanagi, MD;  Location: [place];  Service: Ophthalmology;  Laterality: Right;  TORIC  . HEMORROIDECTOMY  2010   banding  . KIDNEY STONE SURGERY  9/08, 6/15   lithotripsy  . SKIN CANCER EXCISION    . VASECTOMY     Family History  Problem Relation Age of Onset  . AAA (abdominal aortic aneurysm) Father   .  Parkinson's disease Mother   . Arthritis Mother    Social History   Occupational History  . Not on file.   Social History Main Topics  . Smoking status: Former Smoker    Packs/day: 0.50    Years: 38.00    Types: Cigarettes    Quit date: 07/13/1997  . Smokeless tobacco: Never Used  . Alcohol use No  . Drug use: No  . Sexual activity: Not on file   Tobacco Counseling Counseling given: No   Activities of Daily Living In your present state of health, do you have any difficulty performing the following activities: 01/10/2017  Hearing? N  Vision? N    Difficulty concentrating or making decisions? Y  Walking or climbing stairs? N  Dressing or bathing? N  Doing errands, shopping? N  Preparing Food and eating ? N  Using the Toilet? N  In the past six months, have you accidently leaked urine? N  Do you have problems with loss of bowel control? N  Managing your Medications? N  Managing your Finances? N  Housekeeping or managing your Housekeeping? N  Some recent data might be hidden    Immunizations and Health Maintenance Immunization History  Administered Date(s) Administered  . Pneumococcal Polysaccharide-23 09/01/2010  . Tdap 06/13/2011  . Zoster 09/01/2010   There are no preventive care reminders to display for this patient.  Patient Care Team: Pleas Koch, NP as PCP - General (Internal Medicine) Minna Merritts, MD as Consulting Physician (Cardiology)  Indicate any recent Medical Services you may have received from other than [facility] providers in the past year (date may be approximate).    Assessment:   This is a routine wellness examination for [name].   Hearing/Vision screen  Hearing Screening   125Hz  250Hz  500Hz  1000Hz  2000Hz  3000Hz  4000Hz  6000Hz  8000Hz   Right ear:   40 40 40  40    Left ear:   40 40 40  40    Vision Screening Comments: Last vision exam in March 2018 with Dr. Wallace Going  Dietary issues and exercise activities discussed: Current Exercise Habits: Home exercise routine, Type of exercise: walking (walks 1 mile), Frequency (Times/Week): 3, Intensity: Mild, Exercise limited by: None identified  Goals    . Increase physical activity          Starting 01/10/2017, I will continue to walk at least 3 miles 3 days per week.       Depression Screen PHQ 2/9 Scores 01/10/2017 03/14/2016  PHQ - 2 Score 0 0  PHQ- 9 Score 1 -    Fall Risk Fall Risk  01/10/2017 03/14/2016  Falls in the past year? Yes No  Number falls in past yr: 2 or more -  Injury with Fall? Yes -    Cognitive Function: MMSE - Mini  Mental State Exam 01/10/2017  Orientation to time 5  Orientation to Place 5  Registration 3  Attention/ Calculation 0  Recall 3  Language- name 2 objects 0  Language- repeat 1  Language- follow 3 step command 3  Language- read & follow direction 0  Write a sentence 0  Copy design 0  Total score 20       PLEASE NOTE: A Mini-Cog screen was completed. Maximum score is 20. A value of 0 denotes this part of [place] MMSE was not completed or the patient failed this part of the Mini-Cog screening.   Mini-Cog Screening Orientation to Time - Max 5 pts Orientation to Place - Max 5  pts Registration - Max 3 pts Recall - Max 3 pts Language Repeat - Max 1 pts Language Follow 3 Step Command - Max 3 pts   Screening Tests Health Maintenance  Topic Date Due  . INFLUENZA VACCINE  01/10/2049 (Originally 12/12/2016)  . COLONOSCOPY  06/12/2017  . TETANUS/TDAP  06/12/2021  . PNA vac Low Risk Adult  Completed        Plan:  I have personally reviewed and addressed the Medicare Annual Wellness questionnaire and have noted the following in the patient's chart:  A. Medical and social history B. Use of alcohol, tobacco or illicit drugs  C. Current medications and supplements D. Functional ability and status E.  Nutritional status F.  Physical activity G. Advance directives H. List of other physicians I.  Hospitalizations, surgeries, and ER visits in previous 12 months J.  [place] to include hearing, vision, cognitive, depression L. Referrals and appointments - none  In addition, I have reviewed and discussed with patient certain preventive protocols, quality metrics, and best practice recommendations. A written personalized care plan for preventive services as well as general preventive health recommendations were provided to patient.  See attached scanned questionnaire for additional information.   Signed,   Lindell Noe, MHA, BS, LPN Health Coach

## 2017-01-10 NOTE — Patient Instructions (Signed)
Adam Juarez , Thank you for taking time to come for your Medicare Wellness Visit. I appreciate your ongoing commitment to your health goals. Please review the following plan we discussed and let me know if I can assist you in the future.   These are the goals we discussed: Goals    . Increase physical activity          Starting 01/10/2017, I will continue to walk at least 3 miles 3 days per week.        This is a list of the screening recommended for you and due dates:  Health Maintenance  Topic Date Due  . Flu Shot  01/10/2049*  . Colon Cancer Screening  06/12/2017  . Tetanus Vaccine  06/12/2021  . Pneumonia vaccines  Completed  *Topic was postponed. The date shown is not the original due date.   Preventive Care for Adults  A healthy lifestyle and preventive care can promote health and wellness. Preventive health guidelines for adults include the following key practices.  . A routine yearly physical is a good way to check with your health care provider about your health and preventive screening. It is a chance to share any concerns and updates on your health and to receive a thorough exam.  . Visit your dentist for a routine exam and preventive care every 6 months. Brush your teeth twice a day and floss once a day. Good oral hygiene prevents tooth decay and gum disease.  . The frequency of eye exams is based on your age, health, family medical history, use  of contact lenses, and other factors. Follow your health care provider's ecommendations for frequency of eye exams.  . Eat a healthy diet. Foods like vegetables, fruits, whole grains, low-fat dairy products, and lean protein foods contain the nutrients you need without too many calories. Decrease your intake of foods high in solid fats, added sugars, and salt. Eat the right amount of calories for you. Get information about a proper diet from your health care provider, if necessary.  . Regular physical exercise is one of the most  important things you can do for your health. Most adults should get at least 150 minutes of moderate-intensity exercise (any activity that increases your heart rate and causes you to sweat) each week. In addition, most adults need muscle-strengthening exercises on 2 or more days a week.  Silver Sneakers may be a benefit available to you. To determine eligibility, you may visit the website: www.silversneakers.com or contact program at (308) 174-5105 Mon-Fri between 8AM-8PM.   . Maintain a healthy weight. The body mass index (BMI) is a screening tool to identify possible weight problems. It provides an estimate of body fat based on height and weight. Your health care provider can find your BMI and can help you achieve or maintain a healthy weight.   For adults 20 years and older: ? A BMI below 18.5 is considered underweight. ? A BMI of 18.5 to 24.9 is normal. ? A BMI of 25 to 29.9 is considered overweight. ? A BMI of 30 and above is considered obese.   . Maintain normal blood lipids and cholesterol levels by exercising and minimizing your intake of saturated fat. Eat a balanced diet with plenty of fruit and vegetables. Blood tests for lipids and cholesterol should begin at age 36 and be repeated every 5 years. If your lipid or cholesterol levels are high, you are over 50, or you are at high risk for heart disease, you may  need your cholesterol levels checked more frequently. Ongoing high lipid and cholesterol levels should be treated with medicines if diet and exercise are not working.  . If you smoke, find out from your health care provider how to quit. If you do not use tobacco, please do not start.  . If you choose to drink alcohol, please do not consume more than 2 drinks per day. One drink is considered to be 12 ounces (355 mL) of beer, 5 ounces (148 mL) of wine, or 1.5 ounces (44 mL) of liquor.  . If you are 65-43 years old, ask your health care provider if you should take aspirin to prevent  strokes.  . Use sunscreen. Apply sunscreen liberally and repeatedly throughout the day. You should seek shade when your shadow is shorter than you. Protect yourself by wearing long sleeves, pants, a wide-brimmed hat, and sunglasses year round, whenever you are outdoors.  . Once a month, do a whole body skin exam, using a mirror to look at the skin on your back. Tell your health care provider of new moles, moles that have irregular borders, moles that are larger than a pencil eraser, or moles that have changed in shape or color.

## 2017-01-10 NOTE — Progress Notes (Signed)
,Cardiology Office Note  Date:  01/11/2017   ID:  Adam Juarez, DOB [birth year], MRN [id number]  PCP:  Adam Koch, NP   Chief Complaint  Patient presents with  . OTHER    OD 6 month f/u no complaints today. Meds reviewed verbally with pt.    HPI:  Adam Juarez is a 76 year old gentleman with history of  smoking,  PAD,  aortic atherosclerosis of aorta and iliac vessels on CT scan in 2015 ,  paroxysmal atrial fibrillation,  prior Holter monitor August 2013 showing frequent PVCs,  frequent short runs of supraventricular tachycardia,  stress test at that time showing no ischemia September 2013,  Prior smoking history for 35-40 years who presents for follow-up of his paroxysmal atrial fibrillation  event monitor end of September for 30 days showing no significant atrial fibrillation.  Since that time he reports having paroxysmal tachycardia that is irregular, symptoms similar to previous atrial fibrillation episodes  Denies recent episodes of Tachycardia Tolerating anticoagulation CHADS VASC 3,   Concerned about elevated triglycerides of 300 Lots of potato chips, No regular exercise program  Lab work reviewed with him in detail total cholesterol 140, LDL 50, not on a statin  Previous CT scan  CT scan abdomen from June 2015 pulled up and reviewed with him in detail in the office. Moderate diffuse descending aorta disease extending into the iliac vessels    EKG on today's visit shows normal sinus rhythm with rate 70 bpm, no significant ST or T-wave changes  past medical history reviewed   tachycardia in April 2017 Monitors heart rate on his watch Numerous episodes of tachycardia in March 2017. last in November 2016.  He was admitted to the hospital for atrial fibrillation 08/08/2013, notes indicating he converted to normal sinus rhythm through his hospital course. He was not discharged on medications as he declined beta blockers  Prior echocardiogram reviewed with  him in detail from August was 7 2013 showing normal LV function, mild LVH, a stock dysfunction, aortic valve sclerosis, normal right heart pressures  Holter monitor report August 2013 detailing 12,000 PVCs, 11% of total beat count. 55 supraventricular ectopies, no mention of the longest run   PMH:   has a past medical history of Arthritis; Cancer [facility]) (1991, 2011); Chicken pox; Dysrhythmia (09/2013); GERD (gastroesophageal reflux disease); Headache; PSVT (paroxysmal supraventricular tachycardia) ([place]) (2009); Renal stone (9/08, 6/15); and Wears dentures.  PSH:    Past Surgical History:  Procedure Laterality Date  . CARDIAC CATHETERIZATION  [address] [place]  . CATARACT EXTRACTION W/PHACO Left 07/27/2015   Procedure: CATARACT EXTRACTION PHACO AND INTRAOCULAR LENS PLACEMENT (IOC);  Surgeon: Leandrew Koyanagi, MD;  Location: [place];  Service: Ophthalmology;  Laterality: Left;  TORIC  . CATARACT EXTRACTION W/PHACO Right 08/24/2015   Procedure: CATARACT EXTRACTION PHACO AND INTRAOCULAR LENS PLACEMENT (IOC);  Surgeon: Leandrew Koyanagi, MD;  Location: [place];  Service: Ophthalmology;  Laterality: Right;  TORIC  . HEMORROIDECTOMY  2010   banding  . KIDNEY STONE SURGERY  9/08, 6/15   lithotripsy  . SKIN CANCER EXCISION    . VASECTOMY      Current Outpatient Prescriptions  Medication Sig Dispense Refill  . Ascorbic Acid (VITAMIN C PO) Take by mouth daily.    . Coenzyme Q10 (COQ10) 200 MG CAPS Take 200 mg by mouth daily.    . Cyanocobalamin (VITAMIN B 12 PO) Take 1 tablet by mouth daily.    . Magnesium 500 MG CAPS Take  500 mg by mouth daily.    . metoprolol succinate (TOPROL-XL) 25 MG 24 hr tablet Take 1 tablet (25 mg total) by mouth daily. Take with or immediately following a meal. 30 tablet 6  . Misc Natural Products (GLUCOSAMINE CHONDROITIN ADV PO) Take 1 tablet by mouth daily.    . Multiple Vitamins-Minerals (PRESERVISION AREDS 2 PO) Take by mouth 2  (two) times daily.    Marland Kitchen omeprazole (PRILOSEC) 20 MG capsule Take 20 mg by mouth daily.    . polyvinyl alcohol (LIQUIFILM TEARS) 1.4 % ophthalmic solution Place 1 drop into both eyes as needed for dry eyes.    . Probiotic Product (PROBIOTIC DAILY PO) Take by mouth.    . rivaroxaban (XARELTO) 20 MG TABS tablet Take 1 tablet (20 mg total) by mouth daily with supper. 90 tablet 3  . triamcinolone cream (KENALOG) 0.1 % Apply 1 application topically 2 (two) times daily. 30 g 0  . diltiazem (CARDIZEM) 30 MG tablet Take 1 tablet (30 mg total) by mouth 3 (three) times daily as needed. 90 tablet 3   No current facility-administered medications for this visit.      Allergies:   Other   Social History:  The patient  reports that he quit smoking about 19 years ago. His smoking use included Cigarettes. He has a 19.00 pack-year smoking history. He has never used smokeless tobacco. He reports that he does not drink alcohol or use drugs.   Family History:   family history includes AAA (abdominal aortic aneurysm) in his father; Arthritis in his mother; Parkinson's disease in his mother.    Review of Systems: Review of Systems  Constitutional: Negative.   Respiratory: Negative.   Cardiovascular: Positive for palpitations.       Tachycardia  Gastrointestinal: Negative.   Musculoskeletal: Negative.   Neurological: Negative.   Psychiatric/Behavioral: Negative.   All other systems reviewed and are negative.    PHYSICAL EXAM: VS:  BP 130/80 (BP Location: Left Arm, Patient Position: Sitting, Cuff Size: Normal)   Pulse 70   Ht 5' 9.5" (1.765 m)   Wt 212 lb (96.2 kg)   BMI 30.86 kg/m  , BMI Body mass index is 30.86 kg/m. GEN: Well nourished, well developed, in no acute distress  HEENT: normal  Neck: no JVD, carotid bruits, or masses Cardiac: RRR; no murmurs, rubs, or gallops,no edema  Respiratory:  clear to auscultation bilaterally, normal work of breathing GI: soft, nontender, nondistended, +  BS MS: no deformity or atrophy  Skin: warm and dry, no rash Neuro:  Strength and sensation are intact Psych: euthymic mood, full affect    Recent Labs: 01/10/2017: ALT 16; BUN 16; Creatinine, Ser 0.86; Potassium 4.0; Sodium 139    Lipid Panel Lab Results  Component Value Date   CHOL 142 01/10/2017   HDL 28.40 (L) 01/10/2017   TRIG 327.0 (H) 01/10/2017      Wt Readings from Last 3 Encounters:  01/11/17 212 lb (96.2 kg)  01/10/17 209 lb 12 oz (95.1 kg)  10/31/16 206 lb (93.4 kg)       ASSESSMENT AND PLAN:  Paroxysmal atrial fibrillation (HCC) - Plan: EKG 12-Lead Chads vasc of  3 Tolerating anticoagulation.  metoprolol succinate 25 mg daily Also gave him diltiazem 30 mg pills as needed for breakthrough tachycardia   Aortic atherosclerosis (HCC)  at least moderate disease seen on CT scan with him  Likely from long years of smoking Cholesterol at goal on no medication He does not want  further workup at this time  Smoking history  smoking history, likely responsible for his aortic atherosclerosis Mixed hyperlipidemia  Chronic fatigue Angina on previous office visit Recommended a regular exercise program   Abdominal aortic aneurysm (AAA) without rupture ([place]) Mildly dilated aorta seen on CT scan in 2015 , 3.3 cm  ultrasound result did not show much progression   PAD Likely secondary to long-standing smoking. Denies any claudication symptoms  Hyperlipidemia Goal LDL less than 70   Total encounter time more than 25 minutes  Greater than 50% was spent in counseling and coordination of care with the patient   Disposition:   F/U  12 months   Orders Placed This Encounter  Procedures  . EKG 12-Lead     Signed, Esmond Plants, M.D., Ph.D. 01/11/2017  [place], [place]

## 2017-01-10 NOTE — Progress Notes (Signed)
Pre visit review using our clinic review tool, if applicable. No additional management support is needed unless otherwise documented below in the visit note. 

## 2017-01-11 ENCOUNTER — Ambulatory Visit (INDEPENDENT_AMBULATORY_CARE_PROVIDER_SITE_OTHER): Payer: Medicare Other | Admitting: Cardiovascular Disease

## 2017-01-11 ENCOUNTER — Encounter: Payer: Self-pay | Admitting: Cardiovascular Disease

## 2017-01-11 VITALS — BP 130/80 | HR 70 | Ht 69.5 in | Wt 212.0 lb

## 2017-01-11 DIAGNOSIS — I480 Paroxysmal atrial fibrillation: Secondary | ICD-10-CM | POA: Diagnosis not present

## 2017-01-11 DIAGNOSIS — I493 Ventricular premature depolarization: Secondary | ICD-10-CM

## 2017-01-11 DIAGNOSIS — E782 Mixed hyperlipidemia: Secondary | ICD-10-CM

## 2017-01-11 DIAGNOSIS — I471 Supraventricular tachycardia: Secondary | ICD-10-CM

## 2017-01-11 MED ORDER — DILTIAZEM HCL 30 MG PO TABS: 30.0000 mg | ORAL_TABLET | Freq: Three times a day (TID) | ORAL | 3 refills | Status: DC | PRN

## 2017-01-11 NOTE — Patient Instructions (Addendum)
Medication Instructions:   No medication changes made  Ask Dr. Carlis Abbott about tramadol  Labwork:  No new labs needed  Testing/Procedures:  No further testing at this time   Follow-Up: It was a pleasure seeing you in the office today. Please call us if you have new issues that need to be addressed before your next appt.  [phone]  Your physician wants you to follow-up in: 12 months.  You will receive a reminder letter in the mail two months in advance. If you don't receive a letter, please call our office to schedule the follow-up appointment.  If you need a refill on your cardiac medications before your next appointment, please call your pharmacy.

## 2017-01-11 NOTE — Progress Notes (Signed)
I reviewed health advisor's note, was available for consultation, and agree with documentation and plan.  

## 2017-01-15 ENCOUNTER — Ambulatory Visit (INDEPENDENT_AMBULATORY_CARE_PROVIDER_SITE_OTHER): Payer: Medicare Other | Admitting: Primary Care

## 2017-01-15 ENCOUNTER — Other Ambulatory Visit: Payer: Self-pay | Admitting: Primary Care

## 2017-01-15 ENCOUNTER — Encounter: Payer: Self-pay | Admitting: Primary Care

## 2017-01-15 VITALS — BP 120/68 | HR 68 | Temp 98.7°F | Ht 69.5 in | Wt 212.0 lb

## 2017-01-15 DIAGNOSIS — R519 Headache, unspecified: Secondary | ICD-10-CM | POA: Insufficient documentation

## 2017-01-15 DIAGNOSIS — E785 Hyperlipidemia, unspecified: Secondary | ICD-10-CM

## 2017-01-15 DIAGNOSIS — R7303 Prediabetes: Secondary | ICD-10-CM

## 2017-01-15 DIAGNOSIS — I719 Aortic aneurysm of unspecified site, without rupture: Secondary | ICD-10-CM | POA: Diagnosis not present

## 2017-01-15 DIAGNOSIS — Z136 Encounter for screening for cardiovascular disorders: Secondary | ICD-10-CM

## 2017-01-15 DIAGNOSIS — I480 Paroxysmal atrial fibrillation: Secondary | ICD-10-CM

## 2017-01-15 DIAGNOSIS — R51 Headache: Secondary | ICD-10-CM

## 2017-01-15 DIAGNOSIS — I471 Supraventricular tachycardia: Secondary | ICD-10-CM | POA: Diagnosis not present

## 2017-01-15 DIAGNOSIS — Z0000 Encounter for general adult medical examination without abnormal findings: Secondary | ICD-10-CM

## 2017-01-15 DIAGNOSIS — Z0001 Encounter for general adult medical examination with abnormal findings: Secondary | ICD-10-CM | POA: Diagnosis not present

## 2017-01-15 DIAGNOSIS — E782 Mixed hyperlipidemia: Secondary | ICD-10-CM

## 2017-01-15 NOTE — Assessment & Plan Note (Signed)
Recently followed up with cardiologist, no changes in medications.

## 2017-01-15 NOTE — Progress Notes (Signed)
Subjective:    Patient ID: Adam Juarez, male    DOB: 1941/02/07, 76 y.o.   MRN: [id number]  HPI  Adam Juarez is a 76 year old male who presents today for complete physical.  Immunizations: -Tetanus: Completed in 2013 -Influenza: Declines -Pneumonia: Completed Pneumovax in 2012, Prevnar in 2018 -Shingles: Completed in 2012  Diet: He endorses a "fair" diet. Breakfast: Cereal, oatmeal, bacon, eggs Lunch: Sandwich, left overs Dinner: Meat loaf, pizza, restaurants, some vegetables Snacks: Chips Desserts: 1-2 times weekly Beverages: Water, soda  Exercise: He does not routinely exercise. Eye exam: Completed in 2018 Dental exam: Completes semiannually Colonoscopy: Due, opts for cologuard PSA: History of biopsy, normal.  1) Frequent Headaches: History of chronic headaches for years that are located to the bilateral occipital regions. Over the past 1 month he's noticed the headaches have moved to the temporal regions. He denies photophobia, phonophobia, nausea.  Overall his headaches are manageable, he just wanted to make note today.   Review of Systems  Constitutional: Negative for unexpected weight change.  HENT: Negative for rhinorrhea.   Respiratory: Negative for cough and shortness of breath.   Cardiovascular: Negative for chest pain.  Gastrointestinal: Negative for constipation and diarrhea.  Genitourinary: Negative for difficulty urinating.  Musculoskeletal: Negative for arthralgias and myalgias.  Skin: Negative for rash.       Right great toenail continues to be misshapen, told by podiatry that needed to be removed.  Allergic/Immunologic: Negative for environmental allergies.  Neurological: Negative for dizziness, numbness and headaches.       Past Medical History:  Diagnosis Date  . Arthritis    fingers  . 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 Chicken pox   . Dysrhythmia 09/2013   Hx. a-fib x 1 episode patient states he "auto corrected" seen at  [facility]  . GERD (gastroesophageal reflux disease)   . Headache    poor posture - none recently  . PSVT (paroxysmal supraventricular tachycardia) ([place]) 2009   Controlled with "breathing process"  . Renal stone 9/08, 6/15  . Wears dentures    full upper     Social History   Social History  . Marital status: Married    Spouse name: N/A  . Number of children: N/A  . Years of education: N/A   Occupational History  . Not on file.   Social History Main Topics  . Smoking status: Former Smoker    Packs/day: 0.50    Years: 38.00    Types: Cigarettes    Quit date: 07/13/1997  . Smokeless tobacco: Never Used  . Alcohol use No  . Drug use: No  . Sexual activity: Not on file   Other Topics Concern  . Not on file   Social History Narrative  . No narrative on file    Past Surgical History:  Procedure Laterality Date  . CARDIAC CATHETERIZATION  [address] [place]  . CATARACT EXTRACTION W/PHACO Left 07/27/2015   Procedure: CATARACT EXTRACTION PHACO AND INTRAOCULAR LENS PLACEMENT (IOC);  Surgeon: Leandrew Koyanagi, MD;  Location: [place];  Service: Ophthalmology;  Laterality: Left;  TORIC  . CATARACT EXTRACTION W/PHACO Right 08/24/2015   Procedure: CATARACT EXTRACTION PHACO AND INTRAOCULAR LENS PLACEMENT (IOC);  Surgeon: Leandrew Koyanagi, MD;  Location: [place];  Service: Ophthalmology;  Laterality: Right;  TORIC  . HEMORROIDECTOMY  2010   banding  . KIDNEY STONE SURGERY  9/08, 6/15   lithotripsy  . SKIN CANCER EXCISION    .  VASECTOMY      Family History  Problem Relation Age of Onset  . AAA (abdominal aortic aneurysm) Father   . Parkinson's disease Mother   . Arthritis Mother     Allergies  Allergen Reactions  . Other Anaphylaxis and Hives    BEE STINGS BEE STINGS BEE STINGS    Current Outpatient Prescriptions on File Prior to Visit  Medication Sig Dispense Refill  . Ascorbic Acid (VITAMIN C PO) Take by mouth daily.    .  Coenzyme Q10 (COQ10) 200 MG CAPS Take 200 mg by mouth daily.    . Cyanocobalamin (VITAMIN B 12 PO) Take 1 tablet by mouth daily.    Marland Kitchen diltiazem (CARDIZEM) 30 MG tablet Take 1 tablet (30 mg total) by mouth 3 (three) times daily as needed. 90 tablet 3  . Magnesium 500 MG CAPS Take 500 mg by mouth daily.    . metoprolol succinate (TOPROL-XL) 25 MG 24 hr tablet Take 1 tablet (25 mg total) by mouth daily. Take with or immediately following a meal. 30 tablet 6  . Misc Natural Products (GLUCOSAMINE CHONDROITIN ADV PO) Take 1 tablet by mouth daily.    . Multiple Vitamins-Minerals (PRESERVISION AREDS 2 PO) Take by mouth 2 (two) times daily.    Marland Kitchen omeprazole (PRILOSEC) 20 MG capsule Take 20 mg by mouth daily.    . polyvinyl alcohol (LIQUIFILM TEARS) 1.4 % ophthalmic solution Place 1 drop into both eyes as needed for dry eyes.    . Probiotic Product (PROBIOTIC DAILY PO) Take by mouth.    . rivaroxaban (XARELTO) 20 MG TABS tablet Take 1 tablet (20 mg total) by mouth daily with supper. 90 tablet 3  . triamcinolone cream (KENALOG) 0.1 % Apply 1 application topically 2 (two) times daily. 30 g 0   No current facility-administered medications on file prior to visit.     BP 120/68   Pulse 68   Temp 98.7 F (37.1 C) (Oral)   Ht 5' 9.5" (1.765 m)   Wt 212 lb (96.2 kg)   SpO2 94%   BMI 30.86 kg/m    Objective:   Physical Exam  Constitutional: He is oriented to person, place, and time. He appears well-nourished.  HENT:  Right Ear: Tympanic membrane and ear canal normal.  Left Ear: Tympanic membrane and ear canal normal.  Nose: Nose normal. Right sinus exhibits no maxillary sinus tenderness and no frontal sinus tenderness. Left sinus exhibits no maxillary sinus tenderness and no frontal sinus tenderness.  Mouth/Throat: Oropharynx is clear and moist.  Eyes: Pupils are equal, round, and reactive to light. Conjunctivae and EOM are normal.  Neck: Neck supple. Carotid bruit is not present. No thyromegaly  present.  Cardiovascular: Normal rate, regular rhythm and normal heart sounds.   Pulmonary/Chest: Effort normal and breath sounds normal. He has no wheezes. He has no rales.  Abdominal: Soft. Bowel sounds are normal. There is no tenderness.  Musculoskeletal: Normal range of motion.  Neurological: He is alert and oriented to person, place, and time. He has normal reflexes. No cranial nerve deficit.  Skin: Skin is warm and dry.  Blackish/yellow discoloration to right great toenail. Irregular shape. No signs of infection.  Psychiatric: He has a normal mood and affect.          Assessment & Plan:

## 2017-01-15 NOTE — Assessment & Plan Note (Signed)
Triglycerides above goal, TC and LDL stable. Continue to monitor, repeat in 6 months. Recommended improvement in diet and regular exercise.

## 2017-01-15 NOTE — Patient Instructions (Signed)
It's important to improve your diet by reducing consumption of fast food, fried food, processed snack foods, sugary drinks. Increase consumption of fresh vegetables and fruits, whole grains, water.  Ensure you are drinking 64 ounces of water daily.  Start exercising. You should be getting 150 minutes of moderate intensity exercise weekly.  We will notify you of your Cologuard results once received.  You will be contacted regarding your ultrasound.  Please let us know if you have not heard back within one week.   Follow up in 1 year for your annual exam or sooner if needed.  It was a pleasure to see you today!

## 2017-01-15 NOTE — Assessment & Plan Note (Signed)
Immunizations up-to-date. Recommended to increase vegetables, fruits, whole gr. Also recommended regular exercise. Due for repeat aortic ultrasound in 2019. PSA not screened this year given age. Colonoscopy due, he prefers Cologuard. Labs with prediabetes and hypertriglyceridemia Exam overall unremarkable. Repeat labs in 6 months, follow-up in one year.

## 2017-01-15 NOTE — Assessment & Plan Note (Signed)
Repeat ultrasound due in 2019.

## 2017-01-15 NOTE — Assessment & Plan Note (Signed)
Following with cardiology, continue rivaroxaban and metoprolol succinate.

## 2017-01-15 NOTE — Assessment & Plan Note (Signed)
Occur daily, overall manageable with Tylenol. Recent LFTs unremarkable. He will notify if headaches progressed in intensity.

## 2017-01-15 NOTE — Telephone Encounter (Signed)
Will you please call patient to schedule repeat cholesterol and blood sugar check for 6 months?

## 2017-01-17 NOTE — Telephone Encounter (Signed)
Spoken to patient and schedule lab appt in 07/2017

## 2017-02-12 ENCOUNTER — Encounter: Payer: Self-pay | Admitting: Primary Care

## 2017-02-12 NOTE — Telephone Encounter (Signed)
Dawn, See the message from this patient. He had a MWV with our health advisor and then a CPE with me.  Can you please help explain?

## 2017-02-19 DIAGNOSIS — Z1212 Encounter for screening for malignant neoplasm of rectum: Secondary | ICD-10-CM | POA: Diagnosis not present

## 2017-02-19 DIAGNOSIS — Z1211 Encounter for screening for malignant neoplasm of colon: Secondary | ICD-10-CM | POA: Diagnosis not present

## 2017-02-19 LAB — COLOGUARD: COLOGUARD: NEGATIVE

## 2017-02-28 ENCOUNTER — Encounter: Payer: Self-pay | Admitting: Primary Care

## 2017-03-20 ENCOUNTER — Ambulatory Visit (INDEPENDENT_AMBULATORY_CARE_PROVIDER_SITE_OTHER): Payer: Medicare Other | Admitting: Family Medicine

## 2017-03-20 ENCOUNTER — Ambulatory Visit: Payer: Self-pay | Admitting: *Deleted

## 2017-03-20 VITALS — BP 136/76 | HR 74 | Temp 98.3°F | Wt 212.8 lb

## 2017-03-20 DIAGNOSIS — B9789 Other viral agents as the cause of diseases classified elsewhere: Secondary | ICD-10-CM | POA: Diagnosis not present

## 2017-03-20 DIAGNOSIS — J069 Acute upper respiratory infection, unspecified: Secondary | ICD-10-CM | POA: Diagnosis not present

## 2017-03-20 MED ORDER — AMOXICILLIN 875 MG PO TABS: 875.0000 mg | ORAL_TABLET | Freq: Two times a day (BID) | ORAL | 0 refills | Status: DC

## 2017-03-20 MED ORDER — HYDROCOD POLST-CPM POLST ER 10-8 MG/5ML PO SUER: 5.0000 mL | Freq: Every evening | ORAL | 0 refills | Status: DC | PRN

## 2017-03-20 NOTE — Telephone Encounter (Signed)
  Reason for Disposition . Wheezing is present  Answer Assessment - Initial Assessment Questions 1. ONSET: "When did the cough begin?"      1 week 2. SEVERITY: "How bad is the cough today?"      Cough is getting worse- has not improved over time 3. RESPIRATORY DISTRESS: "Describe your breathing."      Yes- it is becoming labor in upper chest- heaviness 4. FEVER: "Do you have a fever?" If so, ask: "What is your temperature, how was it measured, and when did it start?"     no 5. SPUTUM: "Describe the color of your sputum" (clear, white, yellow, green)     Once this morning- thick, yellow 6. HEMOPTYSIS: "Are you coughing up any blood?" If so ask: "How much?" (flecks, streaks, tablespoons, etc.)     no 7. CARDIAC HISTORY: "Do you have any history of heart disease?" (e.g., heart attack, congestive heart failure)      afib- takes medication 8. LUNG HISTORY: "Do you have any history of lung disease?"  (e.g., pulmonary embolus, asthma, emphysema)     No- ex smoker 9. PE RISK FACTORS: "Do you have a history of blood clots?" (or: recent major surgery, recent prolonged travel, bedridden )     no 10. OTHER SYMPTOMS: "Do you have any other symptoms?" (e.g., runny nose, wheezing, chest pain)       Wheezing, tightness 11. PREGNANCY: "Is there any chance you are pregnant?" "When was your last menstrual period?"       n/a 12. TRAVEL: "Have you traveled out of the country in the last month?" (e.g., travel history, exposures)       n/a  Protocols used: [place]

## 2017-03-20 NOTE — Patient Instructions (Signed)
It was a pleasure to see you  Please continue Mucinex and tylenol  If not better in 4-7 days, can start antibiotic

## 2017-03-20 NOTE — Progress Notes (Signed)
Subjective:    Patient ID: Adam Juarez, male    DOB: 01-02-1941, 77 y.o.   MRN: [id number]  HPI This is a 76 yo male who presents today with cough x 1 week. Nonproductive except for this morning when Adam Juarez got out a large amount of yellow-green sputum. Has been taking Mucinex cough and congestion with some relief. Some cough at night. No nasal drainage, no ear pain, no headache, no fever. Feels intermittently SOB, heard some wheezing a couple of days ago.   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   Past Medical History:  Diagnosis Date  . Arthritis    fingers  . 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 Chicken pox   . Dysrhythmia 09/2013   Hx. a-fib x 1 episode patient states Adam Juarez "auto corrected" seen at [facility]  . GERD (gastroesophageal reflux disease)   . Headache    poor posture - none recently  . PSVT (paroxysmal supraventricular tachycardia) ([place]) 2009   Controlled with "breathing process"  . Renal stone 9/08, 6/15  . Wears dentures    full upper   Past Surgical History:  Procedure Laterality Date  . CARDIAC CATHETERIZATION  [address] [place]  2010   banding  . KIDNEY STONE SURGERY  9/08, 6/15   lithotripsy  . SKIN CANCER EXCISION    . VASECTOMY     Family History  Problem Relation Age of Onset  . AAA (abdominal aortic aneurysm) Father   . Parkinson's disease Mother   . Arthritis Mother    Social History   Tobacco Use  . Smoking status: Former Smoker    Packs/day: 0.50    Years: 38.00    Pack years: 19.00    Types: Cigarettes    Last attempt to quit: 07/13/1997    Years since quitting: 19.6  . Smokeless tobacco: Never Used  Substance Use Topics  . Alcohol use: No  . Drug use: No      Review of Systems Per HPI    Objective:   Physical Exam  Constitutional: Adam Juarez is oriented to person, place, and time. Adam Juarez appears well-developed and well-nourished. No  distress.  HENT:  Head: Normocephalic and atraumatic.  Right Ear: External ear normal.  Left Ear: External ear normal.  Mouth/Throat: Oropharynx is clear and moist.  Eyes: Conjunctivae are normal.  Cardiovascular: Normal rate, regular rhythm and normal heart sounds.  Pulmonary/Chest: Effort normal and breath sounds normal.  Occasional dry cough.   Neurological: Adam Juarez is alert and oriented to person, place, and time.  Skin: Skin is warm and dry. Adam Juarez is not diaphoretic.  Psychiatric: Adam Juarez has a normal mood and affect. His behavior is normal. Judgment and thought content normal.  Vitals reviewed.     BP 136/76 (BP Location: Right Arm, Patient Position: Sitting, Cuff Size: Normal)   Pulse 74   Temp 98.3 F (36.8 C) (Oral)   Wt 212 lb 12 oz (96.5 kg)   SpO2 96%   BMI 30.97 kg/m  Wt Readings from Last 3 Encounters:  03/20/17 212 lb 12 oz (96.5 kg)  01/15/17 212 lb (96.2 kg)  01/11/17 212 lb (96.2 kg)       Assessment & Plan:  1. Viral URI with cough - suspect viral etiology, provided wait and see antibiotic if not improved in 4-7 days - RTC precautions reviewed - chlorpheniramine-HYDROcodone (  TUSSIONEX PENNKINETIC ER) 10-8 MG/5ML SUER; Take 5 mLs at bedtime as needed by mouth for cough.  Dispense: 60 mL; Refill: 0 - amoxicillin (AMOXIL) 875 MG tablet; Take 1 tablet (875 mg total) 2 (two) times daily by mouth.  Dispense: 14 tablet; Refill: 0   Clarene Reamer, FNP-BC   Primary Care at [facility], [facility]  03/21/2017 8:22 PM

## 2017-03-21 ENCOUNTER — Encounter: Payer: Self-pay | Admitting: Family Medicine

## 2017-03-22 DIAGNOSIS — H353132 Nonexudative age-related macular degeneration, bilateral, intermediate dry stage: Secondary | ICD-10-CM | POA: Diagnosis not present

## 2017-04-11 DIAGNOSIS — X32XXXA Exposure to sunlight, initial encounter: Secondary | ICD-10-CM | POA: Diagnosis not present

## 2017-04-11 DIAGNOSIS — L570 Actinic keratosis: Secondary | ICD-10-CM | POA: Diagnosis not present

## 2017-04-11 DIAGNOSIS — Z85828 Personal history of other malignant neoplasm of skin: Secondary | ICD-10-CM | POA: Diagnosis not present

## 2017-04-11 DIAGNOSIS — D2271 Melanocytic nevi of right lower limb, including hip: Secondary | ICD-10-CM | POA: Diagnosis not present

## 2017-04-11 DIAGNOSIS — D2262 Melanocytic nevi of left upper limb, including shoulder: Secondary | ICD-10-CM | POA: Diagnosis not present

## 2017-04-11 DIAGNOSIS — D225 Melanocytic nevi of trunk: Secondary | ICD-10-CM | POA: Diagnosis not present

## 2017-04-12 ENCOUNTER — Ambulatory Visit (INDEPENDENT_AMBULATORY_CARE_PROVIDER_SITE_OTHER): Payer: Medicare Other

## 2017-04-12 DIAGNOSIS — Z23 Encounter for immunization: Secondary | ICD-10-CM

## 2017-05-18 DIAGNOSIS — S3992XA Unspecified injury of lower back, initial encounter: Secondary | ICD-10-CM | POA: Diagnosis not present

## 2017-05-18 DIAGNOSIS — K219 Gastro-esophageal reflux disease without esophagitis: Secondary | ICD-10-CM | POA: Diagnosis not present

## 2017-05-18 DIAGNOSIS — S79911A Unspecified injury of right hip, initial encounter: Secondary | ICD-10-CM | POA: Diagnosis not present

## 2017-05-18 DIAGNOSIS — M129 Arthropathy, unspecified: Secondary | ICD-10-CM | POA: Diagnosis not present

## 2017-05-18 DIAGNOSIS — M5136 Other intervertebral disc degeneration, lumbar region: Secondary | ICD-10-CM | POA: Diagnosis not present

## 2017-05-18 DIAGNOSIS — S7002XA Contusion of left hip, initial encounter: Secondary | ICD-10-CM | POA: Diagnosis not present

## 2017-05-18 DIAGNOSIS — M13812 Other specified arthritis, left shoulder: Secondary | ICD-10-CM | POA: Diagnosis not present

## 2017-05-18 DIAGNOSIS — Z87891 Personal history of nicotine dependence: Secondary | ICD-10-CM | POA: Diagnosis not present

## 2017-05-18 DIAGNOSIS — Z043 Encounter for examination and observation following other accident: Secondary | ICD-10-CM | POA: Diagnosis not present

## 2017-05-18 DIAGNOSIS — Q2546 Tortuous aortic arch: Secondary | ICD-10-CM | POA: Diagnosis not present

## 2017-05-18 DIAGNOSIS — M1612 Unilateral primary osteoarthritis, left hip: Secondary | ICD-10-CM | POA: Diagnosis not present

## 2017-05-18 DIAGNOSIS — M5137 Other intervertebral disc degeneration, lumbosacral region: Secondary | ICD-10-CM | POA: Diagnosis not present

## 2017-05-18 DIAGNOSIS — I771 Stricture of artery: Secondary | ICD-10-CM | POA: Diagnosis not present

## 2017-05-18 DIAGNOSIS — R079 Chest pain, unspecified: Secondary | ICD-10-CM | POA: Diagnosis not present

## 2017-05-18 DIAGNOSIS — R930 Abnormal findings on diagnostic imaging of skull and head, not elsewhere classified: Secondary | ICD-10-CM | POA: Diagnosis not present

## 2017-05-18 DIAGNOSIS — M47817 Spondylosis without myelopathy or radiculopathy, lumbosacral region: Secondary | ICD-10-CM | POA: Diagnosis not present

## 2017-05-18 DIAGNOSIS — I714 Abdominal aortic aneurysm, without rupture: Secondary | ICD-10-CM | POA: Diagnosis not present

## 2017-05-20 ENCOUNTER — Encounter: Payer: Self-pay | Admitting: Primary Care

## 2017-05-22 ENCOUNTER — Encounter: Payer: Self-pay | Admitting: Internal Medicine

## 2017-05-22 ENCOUNTER — Ambulatory Visit (INDEPENDENT_AMBULATORY_CARE_PROVIDER_SITE_OTHER): Payer: Medicare Other | Admitting: Internal Medicine

## 2017-05-22 DIAGNOSIS — S79912A Unspecified injury of left hip, initial encounter: Secondary | ICD-10-CM | POA: Diagnosis not present

## 2017-05-22 DIAGNOSIS — I714 Abdominal aortic aneurysm, without rupture, unspecified: Secondary | ICD-10-CM

## 2017-05-22 DIAGNOSIS — S3992XS Unspecified injury of lower back, sequela: Secondary | ICD-10-CM | POA: Diagnosis not present

## 2017-05-22 DIAGNOSIS — W19XXXA Unspecified fall, initial encounter: Secondary | ICD-10-CM | POA: Insufficient documentation

## 2017-05-22 MED ORDER — OMEPRAZOLE 20 MG PO CPDR: 20.0000 mg | DELAYED_RELEASE_CAPSULE | Freq: Every day | ORAL | 3 refills | Status: DC

## 2017-05-22 MED ORDER — TIZANIDINE HCL 4 MG PO TABS: 4.0000 mg | ORAL_TABLET | Freq: Every evening | ORAL | 0 refills | Status: DC | PRN

## 2017-05-22 NOTE — Progress Notes (Signed)
Subjective:    Patient ID: Adam Juarez, male    DOB: Sep 05, 1940, 77 y.o.   MRN: [id number]  HPI Here with wife for evaluation after fall while visiting in [place]  Was on friend's patio and tripped moving between two chairs--- 1/4 Went down hard--hit head, left hip and shoulder, wrenched back.  Screamed out Needed help standing up and sat down Seemed okay at first [place] to bed--but awoke ~1AM with bad pain so went to ER Had head CT, lumbar spine CT, chest, hips and left shoulder x-ray'd----- no fractures or worrisome findings IV analgesic finally helped Rx for hydrocodone, diazepam and orphenadrine (didn't fill this one)  Had some confusion and sedation after the meds Only taking valium at bedtime Done with the hydrocodone  Shoulder is "a nuisance"--mostly just trouble with abduction Back pain can be severe--when flares (6/28)---BT he moves a certain way. Will "stop me in my tracks" Uncomfortable at night  Left hip is constant but lower level pain---has big hematoma there  Has tried some tylenol --may have helped some  Told AAA was 4.4cm on scan  Current Outpatient Medications on File Prior to Visit  Medication Sig Dispense Refill  . Ascorbic Acid (VITAMIN C PO) Take by mouth daily.    . Coenzyme Q10 (COQ10) 200 MG CAPS Take 200 mg by mouth daily.    . Cyanocobalamin (VITAMIN B 12 PO) Take 1 tablet by mouth daily.    . Magnesium 500 MG CAPS Take 500 mg by mouth daily.    . Misc Natural Products (GLUCOSAMINE CHONDROITIN ADV PO) Take 1 tablet by mouth daily.    . Multiple Vitamins-Minerals (PRESERVISION AREDS 2 PO) Take by mouth 2 (two) times daily.    Marland Kitchen omeprazole (PRILOSEC) 20 MG capsule Take 20 mg by mouth daily.    . polyvinyl alcohol (LIQUIFILM TEARS) 1.4 % ophthalmic solution Place 1 drop into both eyes as needed for dry eyes.    . Probiotic Product (PROBIOTIC DAILY PO) Take by mouth.    . rivaroxaban (XARELTO) 20 MG TABS tablet Take 1 tablet (20 mg total) by mouth  daily with supper. 90 tablet 3  . triamcinolone cream (KENALOG) 0.1 % Apply 1 application topically 2 (two) times daily. 30 g 0  . diltiazem (CARDIZEM) 30 MG tablet Take 1 tablet (30 mg total) by mouth 3 (three) times daily as needed. (Patient not taking: Reported on 05/22/2017) 90 tablet 3  . metoprolol succinate (TOPROL-XL) 25 MG 24 hr tablet Take 1 tablet (25 mg total) by mouth daily. Take with or immediately following a meal. 30 tablet 6   No current facility-administered medications on file prior to visit.     Allergies  Allergen Reactions  . Other Anaphylaxis and Hives    BEE STINGS BEE STINGS BEE STINGS    Past Medical History:  Diagnosis Date  . Arthritis    fingers  . 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 Chicken pox   . Dysrhythmia 09/2013   Hx. a-fib x 1 episode patient states he "auto corrected" seen at [facility]  . GERD (gastroesophageal reflux disease)   . Headache    poor posture - none recently  . PSVT (paroxysmal supraventricular tachycardia) ([place]) 2009   Controlled with "breathing process"  . Renal stone 9/08, 6/15  . Wears dentures    full upper    Past Surgical History:  Procedure Laterality Date  . CARDIAC CATHETERIZATION  [address] [place]  .  CATARACT EXTRACTION W/PHACO Left 07/27/2015   Procedure: CATARACT EXTRACTION PHACO AND INTRAOCULAR LENS PLACEMENT (IOC);  Surgeon: Leandrew Koyanagi, MD;  Location: [place];  Service: Ophthalmology;  Laterality: Left;  TORIC  . CATARACT EXTRACTION W/PHACO Right 08/24/2015   Procedure: CATARACT EXTRACTION PHACO AND INTRAOCULAR LENS PLACEMENT (IOC);  Surgeon: Leandrew Koyanagi, MD;  Location: [place];  Service: Ophthalmology;  Laterality: Right;  TORIC  . HEMORROIDECTOMY  2010   banding  . KIDNEY STONE SURGERY  9/08, 6/15   lithotripsy  . SKIN CANCER EXCISION    . VASECTOMY      Family History  Problem Relation Age of Onset  . AAA (abdominal aortic aneurysm)  Father   . Parkinson's disease Mother   . Arthritis Mother     Social History   Socioeconomic History  . Marital status: Married    Spouse name: Not on file  . Number of children: Not on file  . Years of education: Not on file  . Highest education level: Not on file  Social Needs  . Financial resource strain: Not on file  . Food insecurity - worry: Not on file  . Food insecurity - inability: Not on file  . Transportation needs - medical: Not on file  . Transportation needs - non-medical: Not on file  Occupational History  . Not on file  Tobacco Use  . Smoking status: Former Smoker    Packs/day: 0.50    Years: 38.00    Pack years: 19.00    Types: Cigarettes    Last attempt to quit: 07/13/1997    Years since quitting: 19.8  . Smokeless tobacco: Never Used  Substance and Sexual Activity  . Alcohol use: No  . Drug use: No  . Sexual activity: Not on file  Other Topics Concern  . Not on file  Social History Narrative  . Not on file   Review of Systems No N/V ---other than after the ER meds Appetite is okay No cough or SOB Some sore throat since ER visit--not an issue during the day Feels slightly off balance walking now    Objective:   Physical Exam  Musculoskeletal:  Gait fairly normal Trouble with shirt, getting up on table and out of chair Back pain spot is along lower thoracic paraspinals on right SLR negative Back flexion at least 75 degrees Hematoma starting below lateral left hip and extends down thigh ROM in hip is fairly normal (as is the left shoulder--at least passively)          Assessment & Plan:

## 2017-05-22 NOTE — Assessment & Plan Note (Signed)
Contusion left hip and probably internally in left shoulder Back seems to be muscle spasm and is the most severe pain Discussed tylenol  Time course for hematoma discussed  Lidocaine for back Finish diazepam, then will change to tizanidine

## 2017-05-22 NOTE — Patient Instructions (Signed)
Please try regular tylenol. Consider ice or heat for your back. I would also recommend an over the counter lidocaine patch for 12 hours a day (when your symptoms are worst). Use the muscle relaxer only at bedtime.

## 2017-05-22 NOTE — Assessment & Plan Note (Signed)
3.3cm in 2015 and noted to be 4.4cm on CT scan Will get records Will need rechecked in 6-12 months

## 2017-05-24 ENCOUNTER — Other Ambulatory Visit: Payer: Self-pay | Admitting: Cardiovascular Disease

## 2017-05-26 ENCOUNTER — Other Ambulatory Visit: Payer: Self-pay | Admitting: Cardiovascular Disease

## 2017-05-27 NOTE — Telephone Encounter (Signed)
Please review for refill, Thanks !  

## 2017-05-28 ENCOUNTER — Encounter: Payer: Self-pay | Admitting: Internal Medicine

## 2017-05-28 DIAGNOSIS — M549 Dorsalgia, unspecified: Secondary | ICD-10-CM

## 2017-06-04 ENCOUNTER — Encounter: Payer: Self-pay | Admitting: Primary Care

## 2017-06-07 ENCOUNTER — Other Ambulatory Visit: Payer: Self-pay | Admitting: Primary Care

## 2017-06-07 ENCOUNTER — Encounter: Payer: Self-pay | Admitting: Primary Care

## 2017-06-07 DIAGNOSIS — I714 Abdominal aortic aneurysm, without rupture, unspecified: Secondary | ICD-10-CM

## 2017-06-07 DIAGNOSIS — R7303 Prediabetes: Secondary | ICD-10-CM

## 2017-06-10 ENCOUNTER — Ambulatory Visit: Payer: Medicare Other | Attending: Internal Medicine | Admitting: Physical Therapy

## 2017-06-10 ENCOUNTER — Other Ambulatory Visit: Payer: Self-pay | Admitting: Primary Care

## 2017-06-10 ENCOUNTER — Other Ambulatory Visit: Payer: Self-pay

## 2017-06-10 ENCOUNTER — Telehealth: Payer: Self-pay

## 2017-06-10 ENCOUNTER — Encounter: Payer: Self-pay | Admitting: Physical Therapy

## 2017-06-10 DIAGNOSIS — M545 Low back pain: Secondary | ICD-10-CM | POA: Diagnosis not present

## 2017-06-10 NOTE — Telephone Encounter (Signed)
Copied from [place]. Topic: Inquiry >> Jun 10, 2017  4:57 PM Boyd Kerbs wrote: Reason for CRM:   pt is asking if received xrays and office visit info from [facility] in [place], [place] .  He wanted to see if have before his visit on Wed. 1/30

## 2017-06-10 NOTE — Telephone Encounter (Signed)
Looks like he saw [place] for hospital follow up.  Jeani Hawking, It looks like the medical record release was signed, can we get these records from [place]? Will discuss imaging of his AAA at upcoming visit.

## 2017-06-10 NOTE — Therapy (Signed)
[place] MAIN [facility] SERVICES [address] [place], [place], [address] Phone: [phone]   Fax:  [phone]  Physical Therapy Evaluation  Patient Details  Name: Adam Juarez MRN: [id number] Date of Birth: 1940-11-01 Referring Provider: Dr Silvio Pate   Encounter Date: 06/10/2017  PT End of Session - 06/11/17 0815    Visit Number  1    Number of Visits  1    PT Start Time  3500    PT Stop Time  1615    PT Time Calculation (min)  60 min    Activity Tolerance  Patient tolerated treatment well    Behavior During Therapy  [facility] for tasks assessed/performed       Past Medical History:  Diagnosis Date  . Arthritis    fingers  . 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 Chicken pox   . Dysrhythmia 09/2013   Hx. a-fib x 1 episode patient states he "auto corrected" seen at [facility]  . GERD (gastroesophageal reflux disease)   . Headache    poor posture - none recently  . PSVT (paroxysmal supraventricular tachycardia) ([place]) 2009   Controlled with "breathing process"  . Renal stone 9/08, 6/15  . Wears dentures    full upper    Past Surgical History:  Procedure Laterality Date  . CARDIAC CATHETERIZATION  [address], [place]  . CATARACT EXTRACTION W/PHACO Left 07/27/2015   Procedure: CATARACT EXTRACTION PHACO AND INTRAOCULAR LENS PLACEMENT (IOC);  Surgeon: Leandrew Koyanagi, MD;  Location: [place];  Service: Ophthalmology;  Laterality: Left;  TORIC  . CATARACT EXTRACTION W/PHACO Right 08/24/2015   Procedure: CATARACT EXTRACTION PHACO AND INTRAOCULAR LENS PLACEMENT (IOC);  Surgeon: Leandrew Koyanagi, MD;  Location: [place];  Service: Ophthalmology;  Laterality: Right;  TORIC  . HEMORROIDECTOMY  2010   banding  . KIDNEY STONE SURGERY  9/08, 6/15   lithotripsy  . SKIN CANCER EXCISION    . VASECTOMY      There were no vitals filed for this visit.   Subjective Assessment - 06/10/17 1523     Subjective  Pleasant 77 yo male presents to physical therapy with referral for low back pain following a fall Jan 4 of this year.    Pertinent History  Patient reports 3 falls in last year; most recent due to "clumsiness" on Jan 4 during a trip to [place]; fell "hard" onto concrete patio. Could not sleep secondary to pain, went to hospital; received x-rays and medications. Saw Dr. Silvio Pate (normal PCP, Allie Bossier LPN was booked) on Jan 9. Pt reports brusing on left leg, down and behind leg (exacerbated by blood thinner medication); feels like "everything moved around" in the back; however, his back is feeling better and has returned to baseline. The patient's shoulder and hip were also injured in the fall and the shoulder is his primary complaint presenting with pain with overhead motion. Patient has history of chronic low back pain; has had episodes of therapy but "it always comes back". Denies N/T, radicular symptoms; reports stabbing pain on rising from chair but can arch back or extend neck to avoid; feels this pain is coming from the mid back.. Reports hx of bilateral "busted quads" (tears) with R tear unrepaired    Limitations  Standing;House hold activities    How long can you sit comfortably?  n/a    How long can you stand comfortably?  15 minutes    How long  can you walk comfortably?  1 hr    Diagnostic tests  X-ray (not on file)    Patient Stated Goals  To get out of pain; walking; riding bike; hiking    Currently in Pain?  Yes    Pain Score  1     Pain Location  Back    Pain Orientation  Lower;Right    Pain Descriptors / Indicators  Aching    Pain Type  Chronic pain;Acute pain    Pain Onset  Other (comment) Chronic (years) since retiring from [employer], exacerbated with fall    Pain Frequency  Constant    Aggravating Factors   Rising from chair (without modifications)    Pain Relieving Factors  n/a    Effect of Pain on Daily Activities  n/a    Multiple Pain Sites  Yes    Pain Score  5     Pain Location  Shoulder    Pain Orientation  Left    Pain Descriptors / Indicators  Sharp    Pain Type  Acute pain    Pain Onset  1 to 4 weeks ago    Pain Frequency  Intermittent    Aggravating Factors   Overhead activities    Pain Relieving Factors  Keeping arm below shoulder    Effect of Pain on Daily Activities  Reduced independence with ADLs    Pain Location  Hip    Pain Orientation  Left    Pain Descriptors / Indicators  Aching    Pain Type  Acute pain    Pain Onset  1 to 4 weeks ago    Pain Frequency  Intermittent    Aggravating Factors   Sleeping on left side, sitting (except in recliner)    Pain Relieving Factors  keeping legs extended    Effect of Pain on Daily Activities  Step-to gait on stairs; interfering with sleep          PAIN 1/10 current 1/10 best Aggravated at end range of lumbar motions; aggravated with rising from sitting without modification; eased with medication.     POSTURE Moderate forward head/rounded shoulders. Mild increased kyphosis and decreased lordosis. Moderately bow-legged; adducts hips to stand from sitting. L shoulder (injured) elevated relative to R; arm guarded.  GAIT / MOBILITY / TRANSFERS Independent but stiff; limited hip & knee extension in gait and standing.  PROM / AROM / MMT   Shoulder overhead and HBB ROM grossly limited by pain.  Lumbar ROM     Flex 40  Ext. 20  Side-bend R WNL  Side-bend L WNL  ROT R *  ROT L *  * = Grossly Limited  Side-bending R and rotation B provokes slight pain.   HIP AROM  PROM  MMT MMT   R L R L R L  Flex WNL WNL WNL WNL 4- 3+  Ext. * * WNL WNL 3- 2+  ER   30 31    IR   18 16    * = Grossly limited  Hip extension appears to be limited by strength; ER/IR by tightness  Adduction/abduction deferred due to patient's pain in side-lying.  Knee grossly 4/5   Ankle R grossly 4/5 (3+/5 DF)  L grossly 5/5 (4/5 DF)  SOFT TISSUE LENGTH Hamstrings: limit hip active/passive flexion with  SLR  PALPATION / PAM CPAs: Grade 1 provokes pain at L4, L5; L1-L3 clear UPAs: Grade 1 provokes pain at L4, L5 R > L; L1-L3 clear  Tightness  in paraspinals noted thoracic > lumbar.   No change in symptoms with sacral distraction  NEURO Sensation: light touch apparently intact Reflexes:  nt Coordination: good   SPECIAL TESTS - Supine sign - SLR (but accuracy uncertain due to hip flexion PROM limitations) + Slump R + Seated extension-rotation B ("slight pain") - Prone bent-knee FABER - could not achieve test position (limited in ER)   OUTCOME MEASURES  TEST SCORE INTERPRETATION  Oswestry 46% Severe disability    TREATMENT  Patient reported preferring treatment for shoulder over low back; will seek referral from PCP for shoulder evaluation. Plan - 06/10/17 1822    Clinical Impression Statement  77 yo male presents s/p fall with acute back pain. Shoulder and hip also injured in the fall; patient is limited by pain with overhead and behind-the-back movements. History is remarkable for prior falls and chronic back pain of mild severity intractible to previous episodes of physical therapy. Patient reports that the physical rigors of 20 years of service in the [employer] may have contributed to chronic back pain. Numerous postural deficits are evident including forward head/rounded shoulders, knee varus, and increased thoracic kyphosis with reduced lumbar lordosis. Patient presents with tightness in paraspinals extending to the thoracic region and mild-to-moderate tightness and weakness throughout BLEs. Patient will benefit from skilled physical therapy to reduce pain, improve posture, and improve functional strength and ROM through the hips and spine.    History and Personal Factors relevant to plan of care:  History of falls, chronic back pain, age    Clinical Presentation  Stable    Clinical Presentation due to:  LBP mild, improving    Clinical Decision Making  Low    Rehab Potential   Good    Clinical Impairments Affecting Rehab Potential  + motivated, good general health, family support, mild severity / - chronicity of condition, age    PT Frequency  One time visit Patient will seek PT for shoulder    PT Treatment/Interventions  Aquatic Therapy;Cryotherapy;Electrical Stimulation;Moist Heat;Traction;Ultrasound;Gait training;Functional mobility training;Therapeutic activities;Therapeutic exercise;Patient/family education;Neuromuscular re-education;Balance training;Manual techniques;Dry needling;Passive range of motion;Energy conservation;Taping    PT Next Visit Plan  Patient will seek referral for shoulder pain since his back pain is mild and unconcerning    Consulted and Agree with Plan of Care  Patient       Patient will benefit from skilled therapeutic intervention in order to improve the following deficits and impairments:  Pain, Improper body mechanics, Postural dysfunction, Hypomobility, Decreased strength, Decreased range of motion, Decreased endurance, Decreased activity tolerance, Difficulty walking, Impaired perceived functional ability  Visit Diagnosis: Acute low back pain, unspecified back pain laterality, with sciatica presence unspecified      Problem List Patient Active Problem List   Diagnosis Date Noted  . Fall with injury 05/22/2017  . AAA (abdominal aortic aneurysm) ([place]) 05/22/2017  . Frequent headaches 01/15/2017  . Osteoarthritis 07/13/2016  . Plantar fasciitis 07/13/2016  . Encounter for abdominal aortic aneurysm (AAA) screening 02/17/2016  . Hyperlipidemia 11/27/2015  . Chronic right-sided thoracic back pain 11/27/2015  . Chronic fatigue 11/27/2015  . SVT (supraventricular tachycardia) ([place]) 09/16/2015  . Paroxysmal atrial fibrillation ([place]) 09/16/2015  . 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 Esophageal reflux 09/16/2015  . Preventative health care 09/16/2015   Virgia Land, SPT 06/11/2017, 8:18 AM  Alanson Puls, PT, DPT [place] MAIN [facility] SERVICES [address] [place], [place], [address] Phone: [phone]   Fax:  [phone]  Name: Adam Juarez  MRN: [id number] Date of Birth: 08/04/1940

## 2017-06-12 ENCOUNTER — Ambulatory Visit (INDEPENDENT_AMBULATORY_CARE_PROVIDER_SITE_OTHER): Payer: Medicare Other | Admitting: Primary Care

## 2017-06-12 ENCOUNTER — Encounter: Payer: Self-pay | Admitting: Primary Care

## 2017-06-12 VITALS — BP 126/72 | HR 74 | Temp 97.7°F | Ht 69.5 in | Wt 213.5 lb

## 2017-06-12 DIAGNOSIS — M25512 Pain in left shoulder: Secondary | ICD-10-CM | POA: Diagnosis not present

## 2017-06-12 DIAGNOSIS — I714 Abdominal aortic aneurysm, without rupture, unspecified: Secondary | ICD-10-CM

## 2017-06-12 NOTE — Assessment & Plan Note (Signed)
Request for records of CT scan from [place] as they have not surfaced yet. Will send as FYI to cardiologist. We will need to repeat imaging in 6-12 months based off of CT scan results.

## 2017-06-12 NOTE — Patient Instructions (Signed)
You will be contacted regarding your referral to Physical Therapy.  Please let us know if you have not been contacted within one week.   I'll get in touch with your cardiologist regarding your recent abdominal aortic aneurysm.   Use the muscle relaxer as needed.  It was a pleasure to see you today!

## 2017-06-12 NOTE — Progress Notes (Signed)
Subjective:    Patient ID: Adam Juarez, male    DOB: [birth year], 77 y.o.   MRN: [id number]  HPI  Adam Juarez is a 77 year old male who presents today for follow up of fall in early 2019.  He was visiting friends in [place] when he fell between two chairs hitting his head, left hip and shoulder, and back. He presented to the emergency department in [place] later that morning due to pain. He underwent scanning including CT head, CT lumbar spine, xrays of hips and shoulders. All imaging was unremarkable except for a growth in his AAA.   AAA last measured at 3.3 cm in 2015, now measuring 4.4 cm. He saw his cardiologist in August 2018 who stated that his ultrasound was without "much progression".  He does not have records of the CT scan.  He presents today requesting a referral for physical therapy to his left shoulder. He's been to physical therapy for his back and hip which have improved. He continues to struggle with decreased range of motion and discomfort to his left shoulder. He's taking diazepam at bedtime with improvement.  He also has prescriptions for cyclobenzaprine and tizanidine.  He denies numbness/tingling, neck pain, weakness.  Review of Systems  Musculoskeletal:       Left shoulder pain  Skin: Negative for color change.  Neurological: Negative for weakness and numbness.       Past Medical History:  Diagnosis Date  . Arthritis    fingers  . 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 Chicken pox   . Dysrhythmia 09/2013   Hx. a-fib x 1 episode patient states he "auto corrected" seen at [facility]  . GERD (gastroesophageal reflux disease)   . Headache    poor posture - none recently  . PSVT (paroxysmal supraventricular tachycardia) ([place]) 2009   Controlled with "breathing process"  . Renal stone 9/08, 6/15  . Wears dentures    full upper     Social History   Socioeconomic History  . Marital status: Married    Spouse name: Not on file  . Number of  children: Not on file  . Years of education: Not on file  . Highest education level: Not on file  Social Needs  . Financial resource strain: Not on file  . Food insecurity - worry: Not on file  . Food insecurity - inability: Not on file  . Transportation needs - medical: Not on file  . Transportation needs - non-medical: Not on file  Occupational History  . Not on file  Tobacco Use  . Smoking status: Former Smoker    Packs/day: 0.50    Years: 38.00    Pack years: 19.00    Types: Cigarettes    Last attempt to quit: 07/13/1997    Years since quitting: 19.9  . Smokeless tobacco: Never Used  Substance and Sexual Activity  . Alcohol use: No  . Drug use: No  . Sexual activity: Not on file  Other Topics Concern  . Not on file  Social History Narrative  . Not on file    Past Surgical History:  Procedure Laterality Date  . CARDIAC CATHETERIZATION  [address] [place]  . CATARACT EXTRACTION W/PHACO Left 07/27/2015   Procedure: CATARACT EXTRACTION PHACO AND INTRAOCULAR LENS PLACEMENT (IOC);  Surgeon: Leandrew Koyanagi, MD;  Location: [place];  Service: Ophthalmology;  Laterality: Left;  TORIC  . CATARACT EXTRACTION W/PHACO Right 08/24/2015   Procedure:  CATARACT EXTRACTION PHACO AND INTRAOCULAR LENS PLACEMENT (IOC);  Surgeon: Leandrew Koyanagi, MD;  Location: [place];  Service: Ophthalmology;  Laterality: Right;  TORIC  . HEMORROIDECTOMY  2010   banding  . KIDNEY STONE SURGERY  9/08, 6/15   lithotripsy  . SKIN CANCER EXCISION    . VASECTOMY      Family History  Problem Relation Age of Onset  . AAA (abdominal aortic aneurysm) Father   . Parkinson's disease Mother   . Arthritis Mother     Allergies  Allergen Reactions  . Other Anaphylaxis and Hives    BEE STINGS BEE STINGS BEE STINGS    Current Outpatient Medications on File Prior to Visit  Medication Sig Dispense Refill  . Ascorbic Acid (VITAMIN C PO) Take by mouth daily.    . Coenzyme  Q10 (COQ10) 200 MG CAPS Take 200 mg by mouth daily.    . Cyanocobalamin (VITAMIN B 12 PO) Take 1 tablet by mouth daily.    . cyclobenzaprine (FLEXERIL) 5 MG tablet Take 5 mg by mouth 3 (three) times daily as needed for muscle spasms.    Marland Kitchen diltiazem (CARDIZEM) 30 MG tablet Take 1 tablet (30 mg total) by mouth 3 (three) times daily as needed. 90 tablet 3  . Magnesium 500 MG CAPS Take 500 mg by mouth daily.    . metoprolol succinate (TOPROL-XL) 25 MG 24 hr tablet TAKE 1 TABLET DAILY WITH OR IMMEDIATELY FOLLOWING A MEAL 90 tablet 3  . Misc Natural Products (GLUCOSAMINE CHONDROITIN ADV PO) Take 1 tablet by mouth daily.    . Multiple Vitamins-Minerals (PRESERVISION AREDS 2 PO) Take by mouth 2 (two) times daily.    Marland Kitchen omeprazole (PRILOSEC) 20 MG capsule Take 1 capsule (20 mg total) by mouth daily. 90 capsule 3  . polyvinyl alcohol (LIQUIFILM TEARS) 1.4 % ophthalmic solution Place 1 drop into both eyes as needed for dry eyes.    . Probiotic Product (PROBIOTIC DAILY PO) Take by mouth.    Marland Kitchen tiZANidine (ZANAFLEX) 4 MG tablet Take 1 tablet (4 mg total) by mouth at bedtime as needed for muscle spasms. 20 tablet 0  . triamcinolone cream (KENALOG) 0.1 % Apply 1 application topically 2 (two) times daily. 30 g 0  . XARELTO 20 MG TABS tablet TAKE 1 TABLET DAILY WITH SUPPER 90 tablet 3   No current facility-administered medications on file prior to visit.     BP 126/72   Pulse 74   Temp 97.7 F (36.5 C) (Oral)   Ht 5' 9.5" (1.765 m)   Wt 213 lb 8 oz (96.8 kg)   SpO2 95%   BMI 31.08 kg/m    Objective:   Physical Exam  Constitutional: He appears well-nourished.  Neck: Neck supple.  Cardiovascular: Normal rate.  Pulmonary/Chest: Effort normal and breath sounds normal.  Musculoskeletal:       Left shoulder: He exhibits decreased range of motion and pain. He exhibits no tenderness.  Decrease in range of motion to most planes including extension, flexion, lateral abduction.  Skin: Skin is warm and dry.            Assessment & Plan:

## 2017-06-13 ENCOUNTER — Encounter: Payer: Self-pay | Admitting: Primary Care

## 2017-06-17 ENCOUNTER — Ambulatory Visit: Payer: Medicare Other

## 2017-06-19 ENCOUNTER — Encounter: Payer: Self-pay | Admitting: Primary Care

## 2017-06-19 ENCOUNTER — Other Ambulatory Visit: Payer: Self-pay

## 2017-06-19 ENCOUNTER — Ambulatory Visit: Payer: Medicare Other | Attending: Internal Medicine | Admitting: Physical Therapy

## 2017-06-19 ENCOUNTER — Encounter: Payer: Self-pay | Admitting: Physical Therapy

## 2017-06-19 DIAGNOSIS — M25512 Pain in left shoulder: Secondary | ICD-10-CM

## 2017-06-19 DIAGNOSIS — M6281 Muscle weakness (generalized): Secondary | ICD-10-CM | POA: Insufficient documentation

## 2017-06-19 NOTE — Therapy (Addendum)
[place] MAIN [facility] SERVICES [address] [place], [place], [address] Phone: [phone]   Fax:  [phone]  Physical Therapy Evaluation  Patient Details  Name: Adam Juarez MRN: [id number] Date of Birth: 1940/10/13 Referring Provider: Pleas Koch, NP   Encounter Date: 06/19/2017  PT End of Session - 06/19/17 1543    Visit Number  1    Number of Visits  17    PT Start Time  1300    PT Stop Time  1350    PT Time Calculation (min)  50 min    Activity Tolerance  Patient tolerated treatment well    Behavior During Therapy  [facility] for tasks assessed/performed       Past Medical History:  Diagnosis Date  . Arthritis    fingers  . 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 Chicken pox   . Dysrhythmia 09/2013   Hx. a-fib x 1 episode patient states he "auto corrected" seen at [facility]  . GERD (gastroesophageal reflux disease)   . Headache    poor posture - none recently  . PSVT (paroxysmal supraventricular tachycardia) ([place]) 2009   Controlled with "breathing process"  . Renal stone 9/08, 6/15  . Wears dentures    full upper    Past Surgical History:  Procedure Laterality Date  . CARDIAC CATHETERIZATION  [address] [place]  . CATARACT EXTRACTION W/PHACO Left 07/27/2015   Procedure: CATARACT EXTRACTION PHACO AND INTRAOCULAR LENS PLACEMENT (IOC);  Surgeon: Leandrew Koyanagi, MD;  Location: [place];  Service: Ophthalmology;  Laterality: Left;  TORIC  . CATARACT EXTRACTION W/PHACO Right 08/24/2015   Procedure: CATARACT EXTRACTION PHACO AND INTRAOCULAR LENS PLACEMENT (IOC);  Surgeon: Leandrew Koyanagi, MD;  Location: [place];  Service: Ophthalmology;  Laterality: Right;  TORIC  . HEMORROIDECTOMY  2010   banding  . KIDNEY STONE SURGERY  9/08, 6/15   lithotripsy  . SKIN CANCER EXCISION    . VASECTOMY      There were no vitals filed for this visit.   Subjective Assessment - 06/19/17  1304    Subjective  Pleasant 77 yo male presents to physical therapy with referral for L shoulder pain following a fall Jan 4 of this year.    Pertinent History  Patient was seen by this therapist on 1/28 with referral for back pain; indicated at that time that his back pain was not a problem but he wanted to be seen for his shoulder. Returned today for shoulder evaluation. Patient reported 3 falls in last year; most recent due to "clumsiness" on Jan 4 during a trip to [place]; fell "hard" onto concrete patio. Could not sleep secondary to pain, went to hospital; received x-rays and medications; x-rays apparently negative but have not been received by PCP at this time. The patient's back and hip were also injured in the fall but the shoulder is his primary complaint presenting with pain with overhead motion; the pain has not improved since the fall. Patient denies prior shoulder problems, N/T. He is having difficulty holding things. Reports that he is scoliotic and his L shoulder has always been elevated relative to his R shoulder.    Limitations  House hold activities;Lifting    How long can you sit comfortably?  n/a    How long can you stand comfortably?  15 minutes    How long can you walk comfortably?  1 hr    Diagnostic tests  X-ray (not on file; pt reports negative xrays)    Patient Stated Goals  ADLs without pain; lifting, getting things off shelves    Currently in Pain?  Yes    Pain Score  1    Pain Location  Back    Pain Orientation  Lower;Right    Pain Descriptors / Indicators  Aching    Pain Type  Chronic pain    Pain Onset  Other (comment) Chronic (years) since retiring from [employer], exacerbated with fall    Pain Frequency  Constant    Aggravating Factors   Rising from chair (without modifications)    Multiple Pain Sites  Yes    Pain Score  5    Pain Location  Shoulder    Pain Orientation  Left    Pain Descriptors / Indicators  Sharp    Pain Type  Acute pain    Pain Onset  More than a  month ago    Pain Frequency  Intermittent    Aggravating Factors   Overhead activities    Pain Relieving Factors  Keeping arm below shoulder    Effect of Pain on Daily Activities  Reduced independence with ADLs    Pain Score  0    Pain Location  Hip    Pain Orientation  Left    Pain Descriptors / Indicators  Aching    Pain Type  Acute pain    Pain Onset  More than a month ago    Pain Frequency  Intermittent    Aggravating Factors   Sleeping on left side, sitting (except in recliner)    Pain Relieving Factors  keeping legs extended    Effect of Pain on Daily Activities  Step-to gait on stairs; interfering with sleep         [facility] PT Assessment - 06/19/17 0001      Assessment   Medical Diagnosis  Acute pain of left shoulder    Referring Provider  Pleas Koch, NP    Onset Date/Surgical Date  05/17/17    Hand Dominance  Right    Prior Therapy  Yes      Precautions   Precautions  None      Restrictions   Weight Bearing Restrictions  No      Balance Screen   Has the patient fallen in the past 6 months  Yes    How many times?  2    Has the patient had a decrease in activity level because of a fear of falling?   Yes    Is the patient reluctant to leave their home because of a fear of falling?   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 Diminishes  rapidly.    POSTURE R lumbar scoliosis L thoracic scoliosis L shoulder elevated relative to R Moderate forward head/rounded shoulders   GAIT Not assessed  MOBILITY / TRANSFERS Independent  PROM / AROM / MMT   Shoulder AROM  PROM    R L R L  Flex WNL 40  85  Ext.  WNL    ABD WNL 45  74  Scaption  45    ER  WNL  WFL  IR  WNL  WFL  HBH WNL --    HBB WNL L2    AROM assessed standing/seated; IR/ER at 0 deg. flexion PROM assessed supine  Patient's scapula was hypomobile; almost all ROM from [facility] joint  Grip Strength R  L 73.3 lbs/sq in 53.3 lbs/sq in   Interpretation: reduced grip strength in L (difference > 10%)  SOFT TISSUE Tightness noted in periscapular musculature Tightness noted in thoracic paraspinals  PALPATION / PAM Superior aspect of GH tender to palpation Muscle body of supraspinatus, triceps tender to palpation Hypomobility and pain with CPAs in T6-T12 Shoulder inferior, anterior, posterior PAM WNL, not painful  NEURO Sensation: light touch apparently intact Coordination: gross coordination intact   SPECIAL TESTS + Resisted external rotation (infraspinatus muscle test) L + Shrug sign L + Jobe L + Lift-off L    OUTCOME MEASURES  TEST SCORE INTERPRETATION  Quick DASH 41.6% Significant disability           PT Education - 06/19/17 1542    Education provided  Yes    Education Details  Impairments, prognosis, necessity of further testing    Person(s) Educated  Patient    Methods  Explanation    Comprehension  Verbalized understanding       PT Short Term Goals - 06/19/17 1619      PT SHORT TERM GOAL #1   Title  Patient will demonstrate understanding of impairments and be independent with HEP.    Baseline     Time  4   Period  Weeks    Status  new   Target Date  07/17/17        PT Long Term Goals - 06/20/17 1508      PT LONG TERM GOAL #1   Title  Patient will demonstrate normal L scapular position at rest for improved  upper extremity mobility and strength.    Baseline  L scapula elevated at rest.    Time  8    Period  Weeks    Status  New    Target Date  08/14/17      PT LONG TERM GOAL #2   Title  Patient will demonstrate L grip strength of 85% of grip strength on R to demonstrate return to functional strength in left upper extremity.    Baseline  L hand 73% strength of R    Time  8    Period  Weeks    Status  New    Target Date  08/14/17      PT LONG TERM GOAL #3   Title  Patient will reduce QuickDASH score to 15% to demonstrate reduced upper extremity disability.    Baseline  QuickDASH 41.6%    Time  8    Period  Weeks    Status  New    Target Date  08/14/17      PT LONG TERM GOAL #4   Title  Patient will demonstrate active L shoulder flexion to 120 and abduction to 130 to demonstrate  full functional range in the upper extremity.    Baseline  Active L shoulder flexion 40, abduction 45    Time  8    Period  Weeks    Status  New    Target Date  08/14/17        Plan - 06/19/17 1544    Clinical Impression Statement  This 77 yo male presents s/p fall with acute L shoulder pain. Numerous postural deficits are evident including forward head/rounded shoulders, increased thoracic kyphosis, reduced lumbar lordosis, mild thoracolumbar scoliosis. Patient's thoracic spine is hypomobile and painful from T6-T12., scapula is  elevated at rest on L and hypomobile with tightness in periscapular musculature. Patient's left shoulder active flexion, abduction, and scaption is accomplished almost entirely at the glenohumeral joint and is limited by pain. Patient has left shoulder greater range with PROM but is still limited and painful. Patient has weakness in left shoulder flexion, abduction, and external and internal rotation and patient demonstrates normal strength in left elbow flexion. Patent is tender to palpation focally over the left supraspinatus muscle belly, the left superior glenohumeral joint capsule,  and the left  triceps muscle belly and broadly over the left  scapula and thoracic spine. Special test indicate possible rotator cuff involvement. . Given the traumatic onset and the unchanging nature of the patient's presentation, additional imaging of the shoulder is indicated. Patient will benefit from skilled PT to improve ROM and strength and decrease pain to left shoulder.     History and Personal Factors relevant to plan of care:  History of falls, scoliosis, age    Clinical Presentation  Stable    Clinical Presentation due to:  Single-joint involvement, consistent pain presentation    Clinical Decision Making  Low    Rehab Potential  Good    Clinical Impairments Affecting Rehab Potential  + motivated, good general health, family support, acute injury / - pain not improving, age    PT Frequency  2x / week Additional tests indicated    PT Duration  8 weeks    PT Treatment/Interventions  Aquatic Therapy;Cryotherapy;Electrical Stimulation;Moist Heat;Traction;Ultrasound;Gait training;Functional mobility training;Therapeutic activities;Therapeutic exercise;Patient/family education;Neuromuscular re-education;Balance training;Manual techniques;Dry needling;Passive range of motion;Energy conservation;Taping    PT Next Visit Plan  Additional tests will be requested by PT to r/o rotator cuff tear.    Consulted and Agree with Plan of Care  Patient                Patient will benefit from skilled therapeutic intervention in order to improve the following deficits and impairments:  Pain, Postural dysfunction, Hypomobility, Decreased strength, Decreased range of motion, Decreased activity tolerance, Difficulty walking, Impaired perceived functional ability, Impaired flexibility, Impaired UE functional use, Decreased mobility  Visit Diagnosis: Acute pain of left shoulder   muscle weakness generalized   Problem List Patient Active Problem List   Diagnosis Date Noted  . Fall with injury  05/22/2017  . AAA (abdominal aortic aneurysm) ([place]) 05/22/2017  . Frequent headaches 01/15/2017  . Osteoarthritis 07/13/2016  . Plantar fasciitis 07/13/2016  . Encounter for abdominal aortic aneurysm (AAA) screening 02/17/2016  . Hyperlipidemia 11/27/2015  . Chronic right-sided thoracic back pain 11/27/2015  . Chronic fatigue 11/27/2015  . SVT (supraventricular tachycardia) ([place]) 09/16/2015  . Paroxysmal atrial fibrillation ([place]) 09/16/2015  . 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 Esophageal reflux 09/16/2015  . Preventative health care 09/16/2015   Virgia Land, SPT This entire session was performed under direct supervision and direction of a licensed therapist/therapist assistant . I  have personally read, edited and approve of the note as written. Alanson Puls, PT, DPT 06/19/2017, 7:23 PM  [place] MAIN [facility] SERVICES [address] [place], [place], [address] Phone: [phone]   Fax:  [phone]  Name: Jessey Stehlin MRN: [id number] Date of Birth: 1941-05-07

## 2017-06-20 NOTE — Telephone Encounter (Signed)
I refaxed revised records release.

## 2017-06-21 ENCOUNTER — Encounter: Payer: Medicare Other | Attending: Primary Care | Admitting: Registered"

## 2017-06-21 ENCOUNTER — Encounter: Payer: Self-pay | Admitting: Registered"

## 2017-06-21 DIAGNOSIS — R7303 Prediabetes: Secondary | ICD-10-CM

## 2017-06-21 NOTE — Progress Notes (Signed)
Patient was seen on 06/21/17 for the Core Session 1 of Diabetes Prevention Program course at [facility]. The following learning objectives were met by the patient during this class:   Learning Objectives:   Be able to explain the purpose and benefits of the National Diabetes Prevention Program.   Be able to describe the events that will take place at every session.   Know the weight loss and physical activity goals established by the National Diabetes Prevention Program.   Know their own individual weight loss and physical activity goals.   Be able to explain the important effect of self-monitoring on behavior change.   Goals:  Record food and beverage intake in "Food and Activity Tracker" over the next week.  Bring completed "Food and Activity Tracker" for session 1 to session 2 next week. Circle the foods or beverages you think are highest in fat and calories in your food tracker. Read the labels on the food you buy, and consider using measuring cups and spoons to help you calculate the amount you eat. We will talk about measuring in more detail in the coming weeks.   Follow-Up Plan:  Attend Core Session 2 next week.   Bring completed "Food and Activity Tracker" next week to be reviewed by Lifestyle Coach.    

## 2017-06-25 ENCOUNTER — Encounter: Payer: Self-pay | Admitting: Physical Therapy

## 2017-06-27 ENCOUNTER — Encounter: Payer: Medicare Other | Admitting: Physical Therapy

## 2017-06-27 ENCOUNTER — Encounter: Payer: Self-pay | Admitting: Physical Therapy

## 2017-06-27 ENCOUNTER — Ambulatory Visit: Payer: Medicare Other | Admitting: Physical Therapy

## 2017-06-27 DIAGNOSIS — M6281 Muscle weakness (generalized): Secondary | ICD-10-CM

## 2017-06-27 DIAGNOSIS — M25512 Pain in left shoulder: Secondary | ICD-10-CM

## 2017-06-27 NOTE — Patient Instructions (Signed)
Scapular: Protraction - 90 of Flexion    Do this in sitting and no weights , table top., push from shoulders and keep elbows straight http://orth.exer.us/856   Copyright  VHI. All rights reserved.  SCAPULA: Retraction    Lying down and squeeze shoulder blades x 5 sec and 10 repetitions x 2 day  Copyright  VHI. All rights reserved.   .  Copyright  VHI. All rights reserved.

## 2017-06-27 NOTE — Therapy (Signed)
[place] MAIN [facility] SERVICES [address] [place], [place], [address] Phone: [phone]   Fax:  [phone]  Physical Therapy Treatment  Patient Details  Name: Adam Juarez MRN: [id number] Date of Birth: Dec 26, 1940 Referring Provider: Pleas Koch, NP   Encounter Date: 06/27/2017  PT End of Session - 06/27/17 1137    Visit Number  2    Number of Visits  1    PT Start Time  1130    PT Stop Time  1210    PT Time Calculation (min)  40 min    Activity Tolerance  Patient tolerated treatment well;Patient limited by pain    Behavior During Therapy  [facility] for tasks assessed/performed       Past Medical History:  Diagnosis Date  . Arthritis    fingers  . 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 Chicken pox   . Dysrhythmia 09/2013   Hx. a-fib x 1 episode patient states he "auto corrected" seen at [facility]  . GERD (gastroesophageal reflux disease)   . Headache    poor posture - none recently  . PSVT (paroxysmal supraventricular tachycardia) ([place]) 2009   Controlled with "breathing process"  . Renal stone 9/08, 6/15  . Wears dentures    full upper    Past Surgical History:  Procedure Laterality Date  . CARDIAC CATHETERIZATION  [address] [place]  . CATARACT EXTRACTION W/PHACO Left 07/27/2015   Procedure: CATARACT EXTRACTION PHACO AND INTRAOCULAR LENS PLACEMENT (IOC);  Surgeon: Leandrew Koyanagi, MD;  Location: [place];  Service: Ophthalmology;  Laterality: Left;  TORIC  . CATARACT EXTRACTION W/PHACO Right 08/24/2015   Procedure: CATARACT EXTRACTION PHACO AND INTRAOCULAR LENS PLACEMENT (IOC);  Surgeon: Leandrew Koyanagi, MD;  Location: [place];  Service: Ophthalmology;  Laterality: Right;  TORIC  . HEMORROIDECTOMY  2010   banding  . KIDNEY STONE SURGERY  9/08, 6/15   lithotripsy  . SKIN CANCER EXCISION    . VASECTOMY      There were no vitals filed for this visit.  Subjective  Assessment - 06/27/17 1135    Subjective  Patient has no pain with no movement and 6/10 pain to left shoulder with movement.    Pertinent History  Patient was seen by this therapist on 1/28 with referral for back pain; indicated at that time that his back pain was not a problem but he wanted to be seen for his shoulder. Returned today for shoulder evaluation. Patient reported 3 falls in last year; most recent due to "clumsiness" on Jan 4 during a trip to [place]; fell "hard" onto concrete patio. Could not sleep secondary to pain, went to hospital; received x-rays and medications; x-rays apparently negative but have not been received by PCP at this time. The patient's back and hip were also injured in the fall but the shoulder is his primary complaint presenting with pain with overhead motion; the pain has not improved since the fall. Patient denies prior shoulder problems, N/T. He is having difficulty holding things. Reports that he is scoliotic and his L shoulder has always been elevated relative to his R shoulder.    Limitations  House hold activities;Lifting    How long can you sit comfortably?  n/a    How long can you stand comfortably?  15 minutes    How long can you walk comfortably?  1 hr    Diagnostic tests  X-ray (not on file; pt  reports negative xrays)    Patient Stated Goals  Patient wants to perform ADLs without pain; and be able to perform lifting, getting things off shelves without pain    Pain Score  6     Pain Location  Shoulder    Pain Orientation  Left    Pain Descriptors / Indicators  Aching    Pain Type  Chronic pain    Pain Onset  Other (comment) Chronic (years) since retiring from [employer], exacerbated with fall    Pain Frequency  Intermittent    Aggravating Factors   using his arm    Pain Relieving Factors  rest    Effect of Pain on Daily Activities  unable to use his left arm    Pain Onset  More than a month ago    Pain Onset  More than a month ago       Manual therapy PA  glides grade 3 and 4 T1- T 12 x 30 reps x 3 STM to left scapula musculature Left scapula mobilization x 10  HEP :reviewed and given handout Treatment : Therapeutic exercise; AAROM left shoudler x 10 x 2 all planes PROM left shoulder x 10 all planes Scapula protraction supine and sitting x 10  Scapula retraction supine x 10  Educated on HEp: Following treatment patient continues to have 5 /10 pain and limited AROM                       PT Education - 06/27/17 1136    Education provided  Yes    Education Details  HEP    Person(s) Educated  Patient    Methods  Explanation;Demonstration;Tactile cues    Comprehension  Verbalized understanding;Returned demonstration       PT Short Term Goals - 06/19/17 1619      PT SHORT TERM GOAL #1   Title  Patient will demonstrate independence with home exercise program to develop upper extremity functional mobility.    Baseline  No HEP    Time  2    Period  Weeks    Status  New    Target Date  07/04/17      PT SHORT TERM GOAL #2   Title  Patient will improve active L shoulder flexion to 70 and abduction to 70 to demonstrate gains in functional upper extremity mobility.    Baseline  Active L shoulder flexion 40, abduction 45    Time  2    Period  Weeks    Status  New    Target Date  07/04/17        PT Long Term Goals - 06/20/17 1508      PT LONG TERM GOAL #1   Title  Patient will demonstrate normal L scapular position at rest for improved upper extremity mobility and strength.    Baseline  L scapula elevated at rest.    Time  8    Period  Weeks    Status  New    Target Date  08/14/17      PT LONG TERM GOAL #2   Title  Patient will demonstrate L grip strength of 85% of grip strength on R to demonstrate return to functional strength in left upper extremity.    Baseline  L hand 73% strength of R    Time  8    Period  Weeks    Status  New    Target Date  08/14/17  PT LONG TERM GOAL #3   Title  Patient  will reduce QuickDASH score to 15% to demonstrate reduced upper extremity disability.    Baseline  QuickDASH 41.6%    Time  8    Period  Weeks    Status  New    Target Date  08/14/17      PT LONG TERM GOAL #4   Title  Patient will demonstrate active L shoulder flexion to 120 and abduction to 130 to demonstrate full functional range in the upper extremity.    Baseline  Active L shoulder flexion 40, abduction 45    Time  8    Period  Weeks    Status  New    Target Date  08/14/17            Plan - 06/27/17 1219    Clinical Impression Statement  P[place]et presents with 5/10 pain to left shoulder and decreased AROM to left shoulder 0-50 deg with 5/10 pain . He tolerates manual therapy and therapeutic exercise to left shoulder and will continue to beneit from skilled PT to improve pain , ROM and strength.     Rehab Potential  Good    Clinical Impairments Affecting Rehab Potential  + motivated, good general health, family support, acute injury / - pain not improving, age    PT Frequency  2x / week Additional tests indicated    PT Duration  8 weeks    PT Treatment/Interventions  Aquatic Therapy;Cryotherapy;Electrical Stimulation;Moist Heat;Traction;Ultrasound;Gait training;Functional mobility training;Therapeutic activities;Therapeutic exercise;Patient/family education;Neuromuscular re-education;Balance training;Manual techniques;Dry needling;Passive range of motion;Energy conservation;Taping    PT Next Visit Plan  Additional tests will be requested by PT to r/o rotator cuff tear.    Consulted and Agree with Plan of Care  Patient       Patient will benefit from skilled therapeutic intervention in order to improve the following deficits and impairments:  Pain, Postural dysfunction, Hypomobility, Decreased strength, Decreased range of motion, Decreased activity tolerance, Difficulty walking, Impaired perceived functional ability, Impaired flexibility, Impaired UE functional use, Decreased  mobility  Visit Diagnosis: Acute pain of left shoulder  Muscle weakness (generalized)     Problem List Patient Active Problem List   Diagnosis Date Noted  . Fall with injury 05/22/2017  . AAA (abdominal aortic aneurysm) ([place]) 05/22/2017  . Frequent headaches 01/15/2017  . Osteoarthritis 07/13/2016  . Plantar fasciitis 07/13/2016  . Encounter for abdominal aortic aneurysm (AAA) screening 02/17/2016  . Hyperlipidemia 11/27/2015  . Chronic right-sided thoracic back pain 11/27/2015  . Chronic fatigue 11/27/2015  . SVT (supraventricular tachycardia) ([place]) 09/16/2015  . Paroxysmal atrial fibrillation ([place]) 09/16/2015  . 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 Esophageal reflux 09/16/2015  . Preventative health care 09/16/2015    Alanson Puls, [place] DPT 06/27/2017, 12:20 PM  [place] MAIN [facility] SERVICES [address] [place], [place], [address] Phone: [phone]   Fax:  [phone]  Name: Jaquan Sadowsky MRN: [id number] Date of Birth: 11-22-1940

## 2017-06-28 ENCOUNTER — Encounter: Payer: Medicare Other | Admitting: Registered"

## 2017-06-28 ENCOUNTER — Encounter: Payer: Self-pay | Admitting: Registered"

## 2017-06-28 DIAGNOSIS — R7303 Prediabetes: Secondary | ICD-10-CM | POA: Diagnosis not present

## 2017-06-28 NOTE — Progress Notes (Signed)
Patient was seen on 06/28/17 for the Core Session 2 of Diabetes Prevention Program course at [facility]. By the end of this session patients are able to complete the following objectives:   Learning Objectives:  Self-monitor their weight during the weeks following Session 2.   Describe the relationship between fat and calories.   Explain the reason for, and basic principles of, self-monitoring fat grams and calories.   Identify their personal fat gram goals.   Use the ?Fat and Calorie Counter? to calculate the calories and fat grams of a given selection of foods.   Keep a running total of the fat grams they eat each day.   Calculate fat, calories, and serving sizes from nutrition labels.   Goals:   Weigh yourself at the same time each day, or every few days, and record your weight in your Food and Activity Tracker.  Write down everything you eat and drink in your Food and Activity Tracker.  Measure portions as much as you can, and start reading labels.   Use the ?Fat and Calorie Counter? to figure out the amount of fat and calories in what you ate, and write the amount down in your Food and Activity Tracker.  Keep a running fat gram total throughout the day. Come as close to your fat gram goal as you can.   Follow-Up Plan:  Attend Core Session 3 next week.   Bring completed "Food and Activity Tracker" next week to be reviewed by Lifestyle Coach.

## 2017-07-02 ENCOUNTER — Encounter: Payer: Medicare Other | Admitting: Physical Therapy

## 2017-07-03 ENCOUNTER — Encounter: Payer: Self-pay | Admitting: Physical Therapy

## 2017-07-03 ENCOUNTER — Ambulatory Visit: Payer: Medicare Other | Admitting: Physical Therapy

## 2017-07-03 DIAGNOSIS — M25512 Pain in left shoulder: Secondary | ICD-10-CM | POA: Diagnosis not present

## 2017-07-03 DIAGNOSIS — M6281 Muscle weakness (generalized): Secondary | ICD-10-CM

## 2017-07-03 NOTE — Therapy (Addendum)
[place] MAIN [facility] SERVICES [address] [place], [place], [address] Phone: [phone]   Fax:  [phone]  Physical Therapy Treatment  Patient Details  Name: Adam Juarez MRN: [id number] Date of Birth: Oct 29, 1940 Referring Provider: Pleas Koch, NP   Encounter Date: 07/03/2017  PT End of Session - 07/03/17 1842    Visit Number  3    Number of Visits  17    Date for PT Re-Evaluation  08/21/17    PT Start Time  6962    PT Stop Time  1800    PT Time Calculation (min)  45 min    Activity Tolerance  Patient tolerated treatment well;Patient limited by pain    Behavior During Therapy  [facility] for tasks assessed/performed       Past Medical History:  Diagnosis Date  . Arthritis    fingers  . 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 Chicken pox   . Dysrhythmia 09/2013   Hx. a-fib x 1 episode patient states he "auto corrected" seen at [facility]  . GERD (gastroesophageal reflux disease)   . Headache    poor posture - none recently  . PSVT (paroxysmal supraventricular tachycardia) ([place]) 2009   Controlled with "breathing process"  . Renal stone 9/08, 6/15  . Wears dentures    full upper    Past Surgical History:  Procedure Laterality Date  . CARDIAC CATHETERIZATION  [address] [place]  . CATARACT EXTRACTION W/PHACO Left 07/27/2015   Procedure: CATARACT EXTRACTION PHACO AND INTRAOCULAR LENS PLACEMENT (IOC);  Surgeon: Leandrew Koyanagi, MD;  Location: [place];  Service: Ophthalmology;  Laterality: Left;  TORIC  . CATARACT EXTRACTION W/PHACO Right 08/24/2015   Procedure: CATARACT EXTRACTION PHACO AND INTRAOCULAR LENS PLACEMENT (IOC);  Surgeon: Leandrew Koyanagi, MD;  Location: [place];  Service: Ophthalmology;  Laterality: Right;  TORIC  . HEMORROIDECTOMY  2010   banding  . KIDNEY STONE SURGERY  9/08, 6/15   lithotripsy  . SKIN CANCER EXCISION    . VASECTOMY      There were no  vitals filed for this visit.  Subjective Assessment - 07/03/17 1711    Subjective  Patient reports and demonstrates that he can lift left arm through flexion range if right arm assists; pain is not changed.    Pertinent History  Patient was seen by this therapist on 1/28 with referral for back pain; indicated at that time that his back pain was not a problem but he wanted to be seen for his shoulder. Returned today for shoulder evaluation. Patient reported 3 falls in last year; most recent due to "clumsiness" on Jan 4 during a trip to [place]; fell "hard" onto concrete patio. Could not sleep secondary to pain, went to hospital; received x-rays and medications; x-rays apparently negative but have not been received by PCP at this time. The patient's back and hip were also injured in the fall but the shoulder is his primary complaint presenting with pain with overhead motion; the pain has not improved since the fall. Patient denies prior shoulder problems, N/T. He is having difficulty holding things. Reports that he is scoliotic and his L shoulder has always been elevated relative to his R shoulder.    Limitations  House hold activities;Lifting    How long can you sit comfortably?  n/a    How long can you stand comfortably?  15 minutes    How long can you walk  comfortably?  1 hr    Diagnostic tests  X-ray (not on file; pt reports negative xrays)    Patient Stated Goals  Patient wants to perform ADLs without pain; and be able to perform lifting, getting things off shelves without pain    Currently in Pain?  Yes    Pain Score  6     Pain Location  Shoulder    Pain Orientation  Left    Pain Descriptors / Indicators  Aching    Pain Type  Chronic pain    Pain Onset  Other (comment) Chronic (years) since retiring from [employer], exacerbated with fall    Pain Frequency  Intermittent    Aggravating Factors   using arm    Pain Relieving Factors  rest    Effect of Pain on Daily Activities  unable to use L arm     Multiple Pain Sites  No    Pain Onset  More than a month ago    Pain Onset  More than a month ago        Assess HEP: patient instructed to perform protraction exercise with upright posture for improved scapulothoracic mechanics.  Prone: Manual therapy: Anterior central mobilization grades II-III T2-T10, 30 sec oscillations with 5 sec rest x 3 Soft tissue mobilization, myofascial release, and cross friction massage to middle and low trapezius x 8 minutes to improve tissue extensibility and scapular ROM Scapular mobilization in protraction/retraction, superior/inferior glide; patient demonstrated good scapular mobility with limitation primarily in inferior glide.  Following mobilization patient demonstrated markedly improved L shoulder active flexion and abduction with flexion seated  measured at 96 deg.   Scapular pushups over table x 10; patient requires min-mod cues for correct exercise technique; L scapula remains elevated with therapist cues  Wall walks in flexion 5 sec hold at end range x 5 UE AAROM pulleys:  Seated exercises: Flexion x 10 with 5 sec hold at end range Abduction x 10 with 5 sec hold at end range Scaption x 6 with 5 sec hold at end range; patient reported increasing pain and felt "like he was tightening up".  Patient able to achieve normal AAROM in flexion and scaption; abduction limited.  Scap retractions x 10; min cueing to avoid elevation Isometrics in internal and external rotation at 0 deg flexion with 5 sec hold x 10 each; min cues for correct exercise technique (elbow at 90 deg).    PT Education - 07/03/17 1711    Education provided  Yes    Education Details  HEP, shoulder ROM and manual therapy    Methods  Explanation;Verbal cues;Demonstration;Tactile cues    Comprehension  Verbalized understanding;Returned demonstration;Verbal cues required;Tactile cues required       PT Short Term Goals - 06/19/17 1619      PT SHORT TERM GOAL #1   Title   Patient will demonstrate independence with home exercise program to develop upper extremity functional mobility.    Baseline  No HEP    Time  2    Period  Weeks    Status  New    Target Date  07/04/17      PT SHORT TERM GOAL #2   Title  Patient will improve active L shoulder flexion to 70 and abduction to 70 to demonstrate gains in functional upper extremity mobility.    Baseline  Active L shoulder flexion 40, abduction 45    Time  2    Period  Weeks    Status  New  Target Date  07/04/17        PT Long Term Goals - 06/20/17 1508      PT LONG TERM GOAL #1   Title  Patient will demonstrate normal L scapular position at rest for improved upper extremity mobility and strength.    Baseline  L scapula elevated at rest.    Time  8    Period  Weeks    Status  New    Target Date  08/14/17      PT LONG TERM GOAL #2   Title  Patient will demonstrate L grip strength of 85% of grip strength on R to demonstrate return to functional strength in left upper extremity.    Baseline  L hand 73% strength of R    Time  8    Period  Weeks    Status  New    Target Date  08/14/17      PT LONG TERM GOAL #3   Title  Patient will reduce QuickDASH score to 15% to demonstrate reduced upper extremity disability.    Baseline  QuickDASH 41.6%    Time  8    Period  Weeks    Status  New    Target Date  08/14/17      PT LONG TERM GOAL #4   Title  Patient will demonstrate active L shoulder flexion to 120 and abduction to 130 to demonstrate full functional range in the upper extremity.    Baseline  Active L shoulder flexion 40, abduction 45    Time  8    Period  Weeks    Status  New    Target Date  08/14/17            Plan - 07/03/17 1842    Clinical Impression Statement  Patient demonstrates marked improvement in shoulder ROM with manual therapy achieving 96 deg. active flexion and full AAROM in flexion. Abduction remains limited to below 90 deg. Patient tolerates scapular  protraction/retraction and shoulder pulley exercises well; internal/external rotation isometrics initiated to maintain strength. Patient will benefit from skilled physical therapy to improve ROM, strength, and reduce pain in L shoulder for improved functional mobility.    Rehab Potential  Good    Clinical Impairments Affecting Rehab Potential  + motivated, good general health, family support, acute injury / - pain not improving, age    PT Frequency  2x / week    PT Duration  8 weeks    PT Treatment/Interventions  Aquatic Therapy;Cryotherapy;Electrical Stimulation;Moist Heat;Traction;Ultrasound;Gait training;Functional mobility training;Therapeutic activities;Therapeutic exercise;Patient/family education;Neuromuscular re-education;Balance training;Manual techniques;Dry needling;Passive range of motion;Energy conservation;Taping    PT Next Visit Plan  Shoulder ROM, strengthening    Consulted and Agree with Plan of Care  Patient       Patient will benefit from skilled therapeutic intervention in order to improve the following deficits and impairments:  Pain, Postural dysfunction, Hypomobility, Decreased strength, Decreased range of motion, Decreased activity tolerance, Difficulty walking, Impaired perceived functional ability, Impaired flexibility, Impaired UE functional use, Decreased mobility  Visit Diagnosis: Acute pain of left shoulder  Muscle weakness (generalized)     Problem List Patient Active Problem List   Diagnosis Date Noted  . Fall with injury 05/22/2017  . AAA (abdominal aortic aneurysm) ([place]) 05/22/2017  . Frequent headaches 01/15/2017  . Osteoarthritis 07/13/2016  . Plantar fasciitis 07/13/2016  . Encounter for abdominal aortic aneurysm (AAA) screening 02/17/2016  . Hyperlipidemia 11/27/2015  . Chronic right-sided thoracic back pain 11/27/2015  . Chronic  fatigue 11/27/2015  . SVT (supraventricular tachycardia) ([place]) 09/16/2015  . Paroxysmal atrial fibrillation ([place])  09/16/2015  . 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 Esophageal reflux 09/16/2015  . Preventative health care 09/16/2015   Virgia Land, SPT This entire session was performed under direct supervision and direction of a licensed therapist/therapist assistant . I have personally read, edited and approve of the note as written. 07/03/2017, 6:52 PM  Alanson Puls, PT, DPT  [place] MAIN [facility] SERVICES [address] [place], [place], [address] Phone: [phone]   Fax:  [phone]  Name: Cordie Buening MRN: [id number] Date of Birth: [birth year]

## 2017-07-03 NOTE — Patient Instructions (Signed)
External Rotation (Isometric)    Place back of left fist against door frame, with elbow bent. Press fist against door frame. Hold _5_ seconds. Repeat _10_ times. Do _1_ sessions per day.  http://gt2.exer.us/109   Copyright  VHI. All rights reserved.  Internal Rotation (Isometric)    Place palm of right fist against door frame, with elbow bent. Press fist against door frame. Hold __5_ seconds. Repeat _10_ times. Do _1_ sessions per day.  http://gt2.exer.us/107   Copyright  VHI. All rights reserved.  Retraction: Scapula - Bilateral    Facing pulley, straps around shoulders, pull straps back by pinching shoulder blades together. Repeat __10_ times per set. Do _2__ sets per session. Do _7_ sessions per week.  Copyright  VHI. All rights reserved.

## 2017-07-04 ENCOUNTER — Encounter: Payer: Medicare Other | Admitting: Physical Therapy

## 2017-07-05 ENCOUNTER — Encounter: Payer: Medicare Other | Admitting: Registered"

## 2017-07-05 ENCOUNTER — Encounter: Payer: Self-pay | Admitting: Registered"

## 2017-07-05 DIAGNOSIS — R7303 Prediabetes: Secondary | ICD-10-CM | POA: Diagnosis not present

## 2017-07-05 NOTE — Progress Notes (Signed)
Patient was seen on 07/05/17 for the Core Session 3 of Diabetes Prevention Program course at [facility]. By the end of this session patients are able to complete the following objectives:   Learning Objectives:  Weigh and measure foods.  Estimate the fat and calorie content of common foods.  Describe three ways to eat less fat and fewer calories.  Create a plan to eat less fat for the following week.   Goals:   Track weight when weighing outside of class.   Track food and beverages eaten each day in Food and Activity Tracker and include fat grams and calories for each.   Try to stay without fat gram goal.   Complete plan for eating less high fat foods and answer related homework questions.    Follow-Up Plan:  Attend Core Session 4 next week.   Bring completed "Food and Activity Tracker" next week to be reviewed by Lifestyle Coach.

## 2017-07-09 ENCOUNTER — Encounter: Payer: Medicare Other | Admitting: Physical Therapy

## 2017-07-11 ENCOUNTER — Encounter: Payer: Self-pay | Admitting: Primary Care

## 2017-07-11 ENCOUNTER — Encounter: Payer: Medicare Other | Admitting: Physical Therapy

## 2017-07-12 ENCOUNTER — Encounter: Payer: Medicare Other | Attending: Primary Care | Admitting: Registered"

## 2017-07-12 ENCOUNTER — Encounter: Payer: Self-pay | Admitting: Registered"

## 2017-07-12 DIAGNOSIS — R7303 Prediabetes: Secondary | ICD-10-CM

## 2017-07-12 NOTE — Progress Notes (Signed)
Patient was seen on 07/12/17 for the Core Session 4 of Diabetes Prevention Program course at [facility]. By the end of this session patients are able to complete the following objectives:   Learning Objectives:  Explain the health benefits of eating less fat and fewer calories.  Describe the MyPlate food guide and its recommendations, including  how to reduce fat and calories in our diet.  Compare and contrast MyPlate guidelines with participants' eating  habits.  List ways to replace high-fat and high-calorie foods with low-fat and  low-calorie foods.  Explain the importance of eating plenty of whole grains, vegetables, and  fruits, while staying within fat gram goals.  Explain the importance of eating foods from all groups of MyPlate and  of eating a variety of foods from within each group.  Explain why a balanced diet is beneficial to health.  Explain why eating the same foods over and over is not the best strategy  for long-term success.   Goals:   Record weight taken outside of class.   Track foods and beverages eaten each day in the "Food and Activity Tracker," including calories and fat grams for each item.   Practice comparing what you eat with the recommendations of MyPlate using the "Rate Your Plate" handout.   Complete the "Rate Your Plate" handout form on at least 3 days.   Answer homework questions.   Follow-Up Plan:  Attend Core Session 5 next week.   Bring completed "Food and Activity Tracker" next week to be reviewed by Lifestyle Coach.

## 2017-07-15 ENCOUNTER — Ambulatory Visit: Payer: Medicare Other | Attending: Primary Care | Admitting: Physical Therapy

## 2017-07-15 ENCOUNTER — Encounter: Payer: Self-pay | Admitting: Physical Therapy

## 2017-07-15 ENCOUNTER — Other Ambulatory Visit: Payer: Medicare Other

## 2017-07-15 DIAGNOSIS — M25512 Pain in left shoulder: Secondary | ICD-10-CM | POA: Insufficient documentation

## 2017-07-15 DIAGNOSIS — M6281 Muscle weakness (generalized): Secondary | ICD-10-CM

## 2017-07-15 NOTE — Telephone Encounter (Signed)
I cancelled lab appt and per email Gentry Fitz NP is already aware  Pt was going to cancel lab appt.

## 2017-07-15 NOTE — Therapy (Signed)
[place] MAIN [facility] SERVICES [address] [place], [place], [address] Phone: [phone]   Fax:  [phone]  Physical Therapy Treatment  Patient Details  Name: Adam Juarez MRN: [id number] Date of Birth: 1940/08/05 Referring Provider: Pleas Koch, NP   Encounter Date: 07/15/2017  PT End of Session - 07/15/17 1119    Visit Number  4    Number of Visits  17    Date for PT Re-Evaluation  08/21/17    PT Start Time  1115    PT Stop Time  1200    PT Time Calculation (min)  45 min    Activity Tolerance  Patient tolerated treatment well;Patient limited by pain    Behavior During Therapy  [facility] for tasks assessed/performed       Past Medical History:  Diagnosis Date  . Arthritis    fingers  . 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 Chicken pox   . Dysrhythmia 09/2013   Hx. a-fib x 1 episode patient states he "auto corrected" seen at [facility]  . GERD (gastroesophageal reflux disease)   . Headache    poor posture - none recently  . PSVT (paroxysmal supraventricular tachycardia) ([place]) 2009   Controlled with "breathing process"  . Renal stone 9/08, 6/15  . Wears dentures    full upper    Past Surgical History:  Procedure Laterality Date  . CARDIAC CATHETERIZATION  [address] [place]  . CATARACT EXTRACTION W/PHACO Left 07/27/2015   Procedure: CATARACT EXTRACTION PHACO AND INTRAOCULAR LENS PLACEMENT (IOC);  Surgeon: Leandrew Koyanagi, MD;  Location: [place];  Service: Ophthalmology;  Laterality: Left;  TORIC  . CATARACT EXTRACTION W/PHACO Right 08/24/2015   Procedure: CATARACT EXTRACTION PHACO AND INTRAOCULAR LENS PLACEMENT (IOC);  Surgeon: Leandrew Koyanagi, MD;  Location: [place];  Service: Ophthalmology;  Laterality: Right;  TORIC  . HEMORROIDECTOMY  2010   banding  . KIDNEY STONE SURGERY  9/08, 6/15   lithotripsy  . SKIN CANCER EXCISION    . VASECTOMY      There were no  vitals filed for this visit.  Subjective Assessment - 07/15/17 1117    Subjective  Patient continues to have left arm pain that ranges from 0/10-8/10 depending on how high he raises it. He is having pain at night and needs to sleep in his recliner.     Pertinent History  Patient was seen by this therapist on 1/28 with referral for back pain; indicated at that time that his back pain was not a problem but he wanted to be seen for his shoulder. Returned today for shoulder evaluation. Patient reported 3 falls in last year; most recent due to "clumsiness" on Jan 4 during a trip to [place]; fell "hard" onto concrete patio. Could not sleep secondary to pain, went to hospital; received x-rays and medications; x-rays apparently negative but have not been received by PCP at this time. The patient's back and hip were also injured in the fall but the shoulder is his primary complaint presenting with pain with overhead motion; the pain has not improved since the fall. Patient denies prior shoulder problems, N/T. He is having difficulty holding things. Reports that he is scoliotic and his L shoulder has always been elevated relative to his R shoulder.    Limitations  House hold activities;Lifting    How long can you sit comfortably?  n/a    How long can you stand comfortably?  15 minutes    How long can you walk comfortably?  1 hr    Diagnostic tests  X-ray (not on file; pt reports negative xrays)    Patient Stated Goals  Patient wants to perform ADLs without pain; and be able to perform lifting, getting things off shelves without pain    Currently in Pain?  Yes    Pain Score  5  ranges from 0/10-8/10 depending on how high he raises it    Pain Location  Shoulder    Pain Orientation  Left    Pain Descriptors / Indicators  Aching    Pain Type  Chronic pain    Pain Onset  Other (comment) Chronic (years) since retiring from [employer], exacerbated with fall    Aggravating Factors   using his arm    Multiple Pain Sites   No    Pain Onset  More than a month ago    Pain Onset  More than a month ago       Treatment: LUE shoulder : ROM abd 0-70 pain beginning at 30 deg to 70 deg ;    following PT session pain begins at 70 degs abd and range from 0-90 degs    abduction LUE flex pain begins 70- 110 deg ; following PT session pain begins 90-120 flex   Manual therapy:  T 4- T 12 PA glide ,  grade 3 and 4 x 30 bouts  Scapula mobilizations elevation/depression, med/lat with PROM to left shoulder in painfree range PROM to LUE shoulder flex and abd with traction x 10 reps Supine cane exercises: Bench press 0 wieght x 20 Elbows straight hips to 90 deg towards ceiling x 20 , towards ears Elbows straight and cane 90 degs towards ceiling and horizontal hip abd/ add in pain free range  Reviewed HEP and added above exercises                         PT Education - 07/15/17 1119    Education provided  Yes    Education Details  HEP    Person(s) Educated  Patient    Methods  Explanation;Demonstration    Comprehension  Verbalized understanding;Returned demonstration       PT Short Term Goals - 06/19/17 1619      PT SHORT TERM GOAL #1   Title  Patient will demonstrate independence with home exercise program to develop upper extremity functional mobility.    Baseline  No HEP    Time  2    Period  Weeks    Status  New    Target Date  07/04/17      PT SHORT TERM GOAL #2   Title  Patient will improve active L shoulder flexion to 70 and abduction to 70 to demonstrate gains in functional upper extremity mobility.    Baseline  Active L shoulder flexion 40, abduction 45    Time  2    Period  Weeks    Status  New    Target Date  07/04/17        PT Long Term Goals - 06/20/17 1508      PT LONG TERM GOAL #1   Title  Patient will demonstrate normal L scapular position at rest for improved upper extremity mobility and strength.    Baseline  L scapula elevated at rest.    Time  8    Period   Weeks    Status  New  Target Date  08/14/17      PT LONG TERM GOAL #2   Title  Patient will demonstrate L grip strength of 85% of grip strength on R to demonstrate return to functional strength in left upper extremity.    Baseline  L hand 73% strength of R    Time  8    Period  Weeks    Status  New    Target Date  08/14/17      PT LONG TERM GOAL #3   Title  Patient will reduce QuickDASH score to 15% to demonstrate reduced upper extremity disability.    Baseline  QuickDASH 41.6%    Time  8    Period  Weeks    Status  New    Target Date  08/14/17      PT LONG TERM GOAL #4   Title  Patient will demonstrate active L shoulder flexion to 120 and abduction to 130 to demonstrate full functional range in the upper extremity.    Baseline  Active L shoulder flexion 40, abduction 45    Time  8    Period  Weeks    Status  New    Target Date  08/14/17            Plan - 07/15/17 1355    Clinical Impression Statement  Patient responds to manual therapy to left shoulder and thoracic spine with decreased pain and improved ROM following session. He performs supine cane exercises in limited ROM and limited positions to begin strengthening in gravity eliminated and pain free positions. He will continue to benefit from skilled PT to improve strength and ROM and decrease his pain.     Rehab Potential  Good    Clinical Impairments Affecting Rehab Potential  + motivated, good general health, family support, acute injury / - pain not improving, age    PT Frequency  2x / week    PT Duration  8 weeks    PT Treatment/Interventions  Aquatic Therapy;Cryotherapy;Electrical Stimulation;Moist Heat;Traction;Ultrasound;Gait training;Functional mobility training;Therapeutic activities;Therapeutic exercise;Patient/family education;Neuromuscular re-education;Balance training;Manual techniques;Dry needling;Passive range of motion;Energy conservation;Taping    PT Next Visit Plan  Shoulder ROM, strengthening     Consulted and Agree with Plan of Care  Patient       Patient will benefit from skilled therapeutic intervention in order to improve the following deficits and impairments:  Pain, Postural dysfunction, Hypomobility, Decreased strength, Decreased range of motion, Decreased activity tolerance, Difficulty walking, Impaired perceived functional ability, Impaired flexibility, Impaired UE functional use, Decreased mobility  Visit Diagnosis: Acute pain of left shoulder  Muscle weakness (generalized)     Problem List Patient Active Problem List   Diagnosis Date Noted  . Fall with injury 05/22/2017  . AAA (abdominal aortic aneurysm) ([place]) 05/22/2017  . Frequent headaches 01/15/2017  . Osteoarthritis 07/13/2016  . Plantar fasciitis 07/13/2016  . Encounter for abdominal aortic aneurysm (AAA) screening 02/17/2016  . Hyperlipidemia 11/27/2015  . Chronic right-sided thoracic back pain 11/27/2015  . Chronic fatigue 11/27/2015  . SVT (supraventricular tachycardia) ([place]) 09/16/2015  . Paroxysmal atrial fibrillation ([place]) 09/16/2015  . 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 Esophageal reflux 09/16/2015  . Preventative health care 09/16/2015    Alanson Puls, [place] DPT 07/15/2017, 1:58 PM  [place] MAIN [facility] SERVICES [address] [place], [place], [address] Phone: [phone]   Fax:  [phone]  Name: Adam Juarez MRN: [id number] Date of Birth: Oct 30, 1940

## 2017-07-15 NOTE — Telephone Encounter (Signed)
PLEASE NOTE: All timestamps contained within this report are represented as [place] Standard Time. CONFIDENTIALTY NOTICE: This fax transmission is intended only for the addressee. It contains information that is legally privileged, confidential or otherwise protected from use or disclosure. If you are not the intended recipient, you are strictly prohibited from reviewing, disclosing, copying using or disseminating any of this information or taking any action in reliance on or regarding this information. If you have received this fax in error, please notify us immediately by telephone so that we can arrange for its return to [place]. Phone: [phone], Toll-Free: [phone], Fax: [phone] Page: 1 of 1 Call Id: [id number] [place] Night - Client Nonclinical Telephone Record [place] Night - Client Client Site [place] Physician Alma Friendly - NP Contact Type Call Who Is Calling Patient / Member / Family / Caregiver Caller Name Adam Juarez Caller Phone Number [phone] Patient Name Adam Juarez Patient DOB 10-Sep-1940 Call Type Message Only Information Provided Reason for Call Request for General Office Information Initial Comment Caller wants his appt for 07/15/2017 canceled. Additional Comment Call Closed By: Reuel Boom Transaction Date/Time: 07/13/2017 3:14:30 PM (ET)

## 2017-07-16 ENCOUNTER — Other Ambulatory Visit: Payer: Medicare Other

## 2017-07-16 ENCOUNTER — Encounter: Payer: Medicare Other | Admitting: Physical Therapy

## 2017-07-17 ENCOUNTER — Encounter: Payer: Self-pay | Admitting: Physical Therapy

## 2017-07-17 ENCOUNTER — Ambulatory Visit: Payer: Medicare Other | Admitting: Physical Therapy

## 2017-07-17 DIAGNOSIS — M25512 Pain in left shoulder: Secondary | ICD-10-CM | POA: Diagnosis not present

## 2017-07-17 DIAGNOSIS — M6281 Muscle weakness (generalized): Secondary | ICD-10-CM

## 2017-07-17 NOTE — Therapy (Signed)
[place] MAIN [facility] SERVICES [address] [place], [place], [address] Phone: [phone]   Fax:  [phone]  Physical Therapy Treatment  Patient Details  Name: Adam Juarez MRN: [id number] Date of Birth: [birth year] Referring Provider: Pleas Koch, NP   Encounter Date: 07/17/2017  PT End of Session - 07/17/17 1108    Visit Number  5    Number of Visits  17    Date for PT Re-Evaluation  08/21/17    PT Start Time  1105    PT Stop Time  1145    PT Time Calculation (min)  40 min    Activity Tolerance  Patient tolerated treatment well;Patient limited by pain    Behavior During Therapy  [facility] for tasks assessed/performed       Past Medical History:  Diagnosis Date  . Arthritis    fingers  . 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 Chicken pox   . Dysrhythmia 09/2013   Hx. a-fib x 1 episode patient states he "auto corrected" seen at [facility]  . GERD (gastroesophageal reflux disease)   . Headache    poor posture - none recently  . PSVT (paroxysmal supraventricular tachycardia) ([place]) 2009   Controlled with "breathing process"  . Renal stone 9/08, 6/15  . Wears dentures    full upper    Past Surgical History:  Procedure Laterality Date  . CARDIAC CATHETERIZATION  [address], [place]  . CATARACT EXTRACTION W/PHACO Left 07/27/2015   Procedure: CATARACT EXTRACTION PHACO AND INTRAOCULAR LENS PLACEMENT (IOC);  Surgeon: Leandrew Koyanagi, MD;  Location: [place];  Service: Ophthalmology;  Laterality: Left;  TORIC  . CATARACT EXTRACTION W/PHACO Right 08/24/2015   Procedure: CATARACT EXTRACTION PHACO AND INTRAOCULAR LENS PLACEMENT (IOC);  Surgeon: Leandrew Koyanagi, MD;  Location: [place];  Service: Ophthalmology;  Laterality: Right;  TORIC  . HEMORROIDECTOMY  2010   banding  . KIDNEY STONE SURGERY  9/08, 6/15   lithotripsy  . SKIN CANCER EXCISION    . VASECTOMY      There were no  vitals filed for this visit.  Subjective Assessment - 07/17/17 1106    Subjective  Patient continues to have left arm pain that ranges from 5/10 depending on how high he raises it. He is having pain at night and needs to sleep in his recliner.     Pertinent History  Patient was seen by this therapist on 1/28 with referral for back pain; indicated at that time that his back pain was not a problem but he wanted to be seen for his shoulder. Returned today for shoulder evaluation. Patient reported 3 falls in last year; most recent due to "clumsiness" on Jan 4 during a trip to [place]; fell "hard" onto concrete patio. Could not sleep secondary to pain, went to hospital; received x-rays and medications; x-rays apparently negative but have not been received by PCP at this time. The patient's back and hip were also injured in the fall but the shoulder is his primary complaint presenting with pain with overhead motion; the pain has not improved since the fall. Patient denies prior shoulder problems, N/T. He is having difficulty holding things. Reports that he is scoliotic and his L shoulder has always been elevated relative to his R shoulder.    Limitations  House hold activities;Lifting    How long can you sit comfortably?  n/a    How long can you stand comfortably?  15 minutes    How long can you walk comfortably?  1 hr    Diagnostic tests  X-ray (not on file; pt reports negative xrays)    Patient Stated Goals  Patient wants to perform ADLs without pain; and be able to perform lifting, getting things off shelves without pain    Currently in Pain?  Yes    Pain Score  5     Pain Location  Shoulder    Pain Orientation  Left    Pain Descriptors / Indicators  Aching    Pain Type  Chronic pain    Pain Radiating Towards  shoulder    Pain Onset  Other (comment) Chronic (years) since retiring from [employer], exacerbated with fall    Pain Frequency  Intermittent    Aggravating Factors   movement    Pain Relieving  Factors  rest    Effect of Pain on Daily Activities  unable     Multiple Pain Sites  No    Pain Onset  More than a month ago    Pain Onset  More than a month ago       Treatment: LUE shoulder : ROM abd 0-70 pain beginning at 30 deg to 70 deg ;    following PT session pain begins at 70 degs abd and range from 0-90 degs    abduction LUE flex pain begins 70- 110 deg ; following PT session pain begins 90-120 flex   Manual therapy:  T 4- T 12 PA glide ,  grade 3 and 4 x 30 bouts  Scapula mobilizations elevation/depression, med/lat with PROM to left shoulder in painfree range PROM to LUE shoulder flex and abd with traction x 10 reps Supine cane exercises: Bench press 0 wieght x 20 Elbows straight hips to 90 deg towards ceiling x 20 , towards ears Elbows straight and cane 90 degs towards ceiling and horizontal hip abd/ add in pain free range Scapula retraction x 20 with 5 sec hold  Supine scapula protraction x 20  sidelying with towell roll under arm and ER towards ceiling  sidleying flex x 10 without assist , 20 with AAROM  Supine left UE abd x 10 without assist x 2   Reviewed HEP and added above exercises                       PT Education - 07/17/17 1108    Education provided  Yes    Education Details  HEP    Person(s) Educated  Patient    Methods  Explanation;Demonstration;Tactile cues;Verbal cues    Comprehension  Verbalized understanding;Returned demonstration;Verbal cues required       PT Short Term Goals - 06/19/17 1619      PT SHORT TERM GOAL #1   Title  Patient will demonstrate independence with home exercise program to develop upper extremity functional mobility.    Baseline  No HEP    Time  2    Period  Weeks    Status  New    Target Date  07/04/17      PT SHORT TERM GOAL #2   Title  Patient will improve active L shoulder flexion to 70 and abduction to 70 to demonstrate gains in functional upper extremity mobility.    Baseline  Active L  shoulder flexion 40, abduction 45    Time  2    Period  Weeks    Status  New    Target Date  07/04/17        PT Long Term Goals - 06/20/17 1508      PT LONG TERM GOAL #1   Title  Patient will demonstrate normal L scapular position at rest for improved upper extremity mobility and strength.    Baseline  L scapula elevated at rest.    Time  8    Period  Weeks    Status  New    Target Date  08/14/17      PT LONG TERM GOAL #2   Title  Patient will demonstrate L grip strength of 85% of grip strength on R to demonstrate return to functional strength in left upper extremity.    Baseline  L hand 73% strength of R    Time  8    Period  Weeks    Status  New    Target Date  08/14/17      PT LONG TERM GOAL #3   Title  Patient will reduce QuickDASH score to 15% to demonstrate reduced upper extremity disability.    Baseline  QuickDASH 41.6%    Time  8    Period  Weeks    Status  New    Target Date  08/14/17      PT LONG TERM GOAL #4   Title  Patient will demonstrate active L shoulder flexion to 120 and abduction to 130 to demonstrate full functional range in the upper extremity.    Baseline  Active L shoulder flexion 40, abduction 45    Time  8    Period  Weeks    Status  New    Target Date  08/14/17            Plan - 07/17/17 1108    Clinical Impression Statement  Patient performs PROM and AAROM in left UE in supine. He is able to progress to using the arm ranger today and responds to manual therapy with increased AROM to left shoulder following treatment.     Rehab Potential  Good    Clinical Impairments Affecting Rehab Potential  + motivated, good general health, family support, acute injury / - pain not improving, age    PT Frequency  2x / week    PT Duration  8 weeks    PT Treatment/Interventions  Aquatic Therapy;Cryotherapy;Electrical Stimulation;Moist Heat;Traction;Ultrasound;Gait training;Functional mobility training;Therapeutic activities;Therapeutic  exercise;Patient/family education;Neuromuscular re-education;Balance training;Manual techniques;Dry needling;Passive range of motion;Energy conservation;Taping    PT Next Visit Plan  Shoulder ROM, strengthening    Consulted and Agree with Plan of Care  Patient       Patient will benefit from skilled therapeutic intervention in order to improve the following deficits and impairments:  Pain, Postural dysfunction, Hypomobility, Decreased strength, Decreased range of motion, Decreased activity tolerance, Difficulty walking, Impaired perceived functional ability, Impaired flexibility, Impaired UE functional use, Decreased mobility  Visit Diagnosis: Acute pain of left shoulder  Muscle weakness (generalized)     Problem List Patient Active Problem List   Diagnosis Date Noted  . Fall with injury 05/22/2017  . AAA (abdominal aortic aneurysm) ([place]) 05/22/2017  . Frequent headaches 01/15/2017  . Osteoarthritis 07/13/2016  . Plantar fasciitis 07/13/2016  . Encounter for abdominal aortic aneurysm (AAA) screening 02/17/2016  . Hyperlipidemia 11/27/2015  . Chronic right-sided thoracic back pain 11/27/2015  . Chronic fatigue 11/27/2015  . SVT (supraventricular tachycardia) ([place]) 09/16/2015  . Paroxysmal atrial fibrillation ([place]) 09/16/2015  . 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 Esophageal reflux 09/16/2015  . Preventative health care 09/16/2015  Arelia Sneddon S,PT DPT 07/17/2017, 11:11 AM  [place] MAIN [facility] SERVICES [address] [place], [place], [address] Phone: [phone]   Fax:  [phone]  Name: Caylin Raby MRN: [id number] Date of Birth: [birth year]

## 2017-07-17 NOTE — Patient Instructions (Signed)
Shoulder External Rotators      Copyright  VHI. All rights reserved.  SHOULDER: External Rotation (Band)    Place towel between elbow and body. Keep elbow next to body. Holding band, rotate arm away from body. Hold ___ seconds. Use ________ band. ___ reps per set, ___ sets per day, ___ days per week  Copyright  VHI. All rights reserved.  Shoulder Blade Pinch    Pull arms back, pinching shoulder blades together. Hold ____ seconds. Relax. (If necessary, steady self with arms back on support high enough so legs need not bend.) Repeat ____ times. Do ____ sessions per day.  http://[email or web address].exer.us/59   Copyright  VHI. All rights reserved.  SHOULDER: Abduction - Supine    Raise arms out and up at same speed with palms up. Keep elbows straight. Do not shrug shoulders. ___ reps per set, ___ sets per day, ___ days per week   Copyright  VHI. All rights reserved.

## 2017-07-18 ENCOUNTER — Encounter: Payer: Medicare Other | Admitting: Physical Therapy

## 2017-07-19 ENCOUNTER — Encounter: Payer: Self-pay | Admitting: Registered"

## 2017-07-19 ENCOUNTER — Encounter: Payer: Medicare Other | Admitting: Registered"

## 2017-07-19 DIAGNOSIS — R7303 Prediabetes: Secondary | ICD-10-CM

## 2017-07-19 NOTE — Progress Notes (Signed)
Patient was seen on 07/19/17 for the Core Session 5 of Diabetes Prevention Program course at [facility]. By the end of this session patients are able to complete the following objectives:   Learning Objectives:  Establish a physical activity goal.  Explain the importance of the physical activity goal.  Describe their current level of physical activity.  Name ways that they are already physically active.  Develop personal plans for physical activity for the next week.   Goals:   Record weight taken outside of class.   Track foods and beverages eaten each day in the "Food and Activity Tracker," including calories and fat grams for each item.   Make an Activity Plan including date, specific type of activity, and length of time you plan to be active that includes at last 60 minutes of activity for the week.   Track activity type, minutes you were active, and distance you reached each day in the "Food and Activity Tracker."   Follow-Up Plan:  Attend Core Session 6 next week.   Bring completed "Food and Activity Tracker" next week to be reviewed by Lifestyle Coach.

## 2017-07-22 ENCOUNTER — Ambulatory Visit: Payer: Medicare Other | Admitting: Physical Therapy

## 2017-07-22 ENCOUNTER — Encounter: Payer: Self-pay | Admitting: Physical Therapy

## 2017-07-22 DIAGNOSIS — M6281 Muscle weakness (generalized): Secondary | ICD-10-CM

## 2017-07-22 DIAGNOSIS — M25512 Pain in left shoulder: Secondary | ICD-10-CM

## 2017-07-22 NOTE — Therapy (Addendum)
[place] MAIN [facility] SERVICES [address] [place], [place], [address] Phone: [phone]   Fax:  [phone]  Physical Therapy Treatment  Patient Details  Name: Adam Juarez MRN: [id number] Date of Birth: [birth year] Referring Provider: Pleas Koch, NP   Encounter Date: 07/22/2017  PT End of Session - 07/22/17 1046    Visit Number  6    Number of Visits  17    Date for PT Re-Evaluation  08/21/17    PT Start Time  5638    PT Stop Time  1100    PT Time Calculation (min)  45 min    Equipment Utilized During Treatment  Gait belt    Activity Tolerance  Patient tolerated treatment well;Patient limited by pain    Behavior During Therapy  [facility] for tasks assessed/performed       Past Medical History:  Diagnosis Date  . Arthritis    fingers  . 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 Chicken pox   . Dysrhythmia 09/2013   Hx. a-fib x 1 episode patient states he "auto corrected" seen at [facility]  . GERD (gastroesophageal reflux disease)   . Headache    poor posture - none recently  . PSVT (paroxysmal supraventricular tachycardia) ([place]) 2009   Controlled with "breathing process"  . Renal stone 9/08, 6/15  . Wears dentures    full upper    Past Surgical History:  Procedure Laterality Date  . CARDIAC CATHETERIZATION  [address] [place]  . CATARACT EXTRACTION W/PHACO Left 07/27/2015   Procedure: CATARACT EXTRACTION PHACO AND INTRAOCULAR LENS PLACEMENT (IOC);  Surgeon: Leandrew Koyanagi, MD;  Location: [place];  Service: Ophthalmology;  Laterality: Left;  TORIC  . CATARACT EXTRACTION W/PHACO Right 08/24/2015   Procedure: CATARACT EXTRACTION PHACO AND INTRAOCULAR LENS PLACEMENT (IOC);  Surgeon: Leandrew Koyanagi, MD;  Location: [place];  Service: Ophthalmology;  Laterality: Right;  TORIC  . HEMORROIDECTOMY  2010   banding  . KIDNEY STONE SURGERY  9/08, 6/15   lithotripsy  . SKIN CANCER  EXCISION    . VASECTOMY      There were no vitals filed for this visit.  Subjective Assessment - 07/22/17 1043    Subjective  Patient has a rash under his left arm pit that is red but does not hurt or itch. He reports that he is able to reach his right ear better to put in his ear plugs now.     Pertinent History  Patient was seen by this therapist on 1/28 with referral for back pain; indicated at that time that his back pain was not a problem but he wanted to be seen for his shoulder. Returned today for shoulder evaluation. Patient reported 3 falls in last year; most recent due to "clumsiness" on Jan 4 during a trip to [place]; fell "hard" onto concrete patio. Could not sleep secondary to pain, went to hospital; received x-rays and medications; x-rays apparently negative but have not been received by PCP at this time. The patient's back and hip were also injured in the fall but the shoulder is his primary complaint presenting with pain with overhead motion; the pain has not improved since the fall. Patient denies prior shoulder problems, N/T. He is having difficulty holding things. Reports that he is scoliotic and his L shoulder has always been elevated relative to his R shoulder.    Limitations  House hold activities;Lifting    How  long can you sit comfortably?  n/a    How long can you stand comfortably?  15 minutes    How long can you walk comfortably?  1 hr    Diagnostic tests  X-ray (not on file; pt reports negative xrays)    Patient Stated Goals  Patient wants to perform ADLs without pain; and be able to perform lifting, getting things off shelves without pain    Currently in Pain?  Yes    Pain Score  5     Pain Orientation  Left    Pain Descriptors / Indicators  Aching    Pain Type  Chronic pain    Pain Radiating Towards  shoulder    Pain Onset  Other (comment) Chronic (years) since retiring from [employer], exacerbated with fall    Pain Frequency  Intermittent    Aggravating Factors    movement    Pain Relieving Factors  rest    Effect of Pain on Daily Activities  unable to raise much above shoulder height    Multiple Pain Sites  No    Pain Onset  More than a month ago    Pain Onset  More than a month ago       Treatment: LUE shoulder : ROM abd 0-72 pain beginning at 30 deg to 72 deg; following PT session pain begins at 78 degs abd and range from 0-90 degs abduction LUE flex pain begins 83- 110 deg; following PT session pain begins 92-120 flex  Manual therapy: T 4- T 12 PA glide , grade 3 and 4 x 30 bouts Scapula mobilizations elevation/depression, med/lat with PROM to left shoulder in painfree range  Therapeutic exercise: Shoulder ranger flex/ext, flex with shoulder abd/add  X 20  PROM to LUE shoulder flex and abd with traction x 10 reps Supine cane exercises: Bench press 1 lb wieght x 20 Elbows straight hips to 90 deg towards ceiling x 20 , towards ears Elbows straight and cane 90 degs towards ceiling and horizontal hip abd/ add in pain free range Scapula retraction x 20 with 5 sec hold  Supine scapula protraction x 20  sidelying with towell roll under arm and ER towards ceiling  sidleying flex x 10 without assist , 20 with AAROM  Supine left UE abd x 10 without assist x 2  sidelying with table and left shoulder flex x 20   Reviewed HEP and added above exercises                        PT Education - 07/22/17 1045    Education provided  Yes    Education Details  put a cotton undershirt on for rash under his arm , continue HEP    Person(s) Educated  Patient    Methods  Explanation;Tactile cues;Verbal cues;Demonstration    Comprehension  Verbalized understanding;Returned demonstration;Verbal cues required;Tactile cues required       PT Short Term Goals - 07/22/17 1110      PT SHORT TERM GOAL #1   Title  Patient will demonstrate independence with home exercise program to develop upper extremity functional mobility.     Baseline  patient is doing his HEP    Time  2    Period  Weeks    Status  Achieved      PT SHORT TERM GOAL #2   Title  Patient will improve active L shoulder flexion to 70 and abduction to 70 to demonstrate gains in functional  upper extremity mobility.    Baseline  Active L shoulder flexion 90, abduction 72    Time  2    Period  Weeks    Status  Achieved        PT Long Term Goals - 07/22/17 1106      PT LONG TERM GOAL #1   Title  Patient will demonstrate normal L scapular position at rest for improved upper extremity mobility and strength.    Baseline  L scapula elevated at rest.    Time  8    Period  Weeks    Status  Partially Met    Target Date  08/14/17      PT LONG TERM GOAL #2   Title  Patient will demonstrate L grip strength of 85% of grip strength on R to demonstrate return to functional strength in left upper extremity.    Baseline  L hand 73% strength of R    Time  8    Period  Weeks    Status  Partially Met      PT LONG TERM GOAL #3   Title  Patient will reduce QuickDASH score to 15% to demonstrate reduced upper extremity disability.    Baseline  QuickDASH 38%    Time  8    Period  Weeks    Status  Partially Met      PT LONG TERM GOAL #4   Title  Patient will demonstrate active L shoulder flexion to 120 and abduction to 130 to demonstrate full functional range in the upper extremity.    Baseline  Active L shoulder flexion 82, abduction 72    Time  8    Period  Weeks    Status  Partially Met    Target Date  08/14/17            Plan - 07/22/17 1047    Clinical Impression Statement  Continuous verbal cues and tactile cues needed to correct form with exercises and for posture correction. Patient required min verbal cues to perform therapeutic exercise with correct technique. Patient will benefit from skilled PT to improve AROM and decreased left shoulder pain.    Rehab Potential  Good    Clinical Impairments Affecting Rehab Potential  + motivated, good  general health, family support, acute injury / - pain not improving, age    PT Frequency  2x / week    PT Duration  8 weeks    PT Treatment/Interventions  Aquatic Therapy;Cryotherapy;Electrical Stimulation;Moist Heat;Traction;Ultrasound;Gait training;Functional mobility training;Therapeutic activities;Therapeutic exercise;Patient/family education;Neuromuscular re-education;Balance training;Manual techniques;Dry needling;Passive range of motion;Energy conservation;Taping    PT Next Visit Plan  Shoulder ROM, strengthening    Consulted and Agree with Plan of Care  Patient       Patient will benefit from skilled therapeutic intervention in order to improve the following deficits and impairments:  Pain, Postural dysfunction, Hypomobility, Decreased strength, Decreased range of motion, Decreased activity tolerance, Difficulty walking, Impaired perceived functional ability, Impaired flexibility, Impaired UE functional use, Decreased mobility  Visit Diagnosis: Acute pain of left shoulder  Muscle weakness (generalized)     Problem List Patient Active Problem List   Diagnosis Date Noted  . Fall with injury 05/22/2017  . AAA (abdominal aortic aneurysm) ([place]) 05/22/2017  . Frequent headaches 01/15/2017  . Osteoarthritis 07/13/2016  . Plantar fasciitis 07/13/2016  . Encounter for abdominal aortic aneurysm (AAA) screening 02/17/2016  . Hyperlipidemia 11/27/2015  . Chronic right-sided thoracic back pain 11/27/2015  . Chronic fatigue 11/27/2015  .  SVT (supraventricular tachycardia) ([place]) 09/16/2015  . Paroxysmal atrial fibrillation ([place]) 09/16/2015  . 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 Esophageal reflux 09/16/2015  . Preventative health care 09/16/2015    Alanson Puls, PT DPT 07/22/2017, 11:11 AM  [place] MAIN [facility] SERVICES [address] [place], [place], [address] Phone: [phone]   Fax:  [phone]  Name: Adam Juarez MRN: [id number] Date  of Birth: 1940-11-20

## 2017-07-23 ENCOUNTER — Encounter: Payer: Medicare Other | Admitting: Physical Therapy

## 2017-07-24 ENCOUNTER — Encounter: Payer: Self-pay | Admitting: Physical Therapy

## 2017-07-24 ENCOUNTER — Ambulatory Visit: Payer: Medicare Other | Admitting: Physical Therapy

## 2017-07-24 DIAGNOSIS — M25512 Pain in left shoulder: Secondary | ICD-10-CM | POA: Diagnosis not present

## 2017-07-24 DIAGNOSIS — M6281 Muscle weakness (generalized): Secondary | ICD-10-CM

## 2017-07-24 NOTE — Therapy (Signed)
[place] MAIN [facility] SERVICES [address] [place], [place], [address] Phone: [phone]   Fax:  [phone]  Physical Therapy Treatment  Patient Details  Name: Adam Juarez MRN: [id number] Date of Birth: 08-Nov-1940 Referring Provider: Pleas Koch, NP   Encounter Date: 07/24/2017  PT End of Session - 07/24/17 0911    Visit Number  7    Number of Visits  17    Date for PT Re-Evaluation  08/21/17    PT Start Time  0845    PT Stop Time  0930    PT Time Calculation (min)  45 min    Equipment Utilized During Treatment  Gait belt    Activity Tolerance  Patient tolerated treatment well;Patient limited by pain    Behavior During Therapy  [facility] for tasks assessed/performed       Past Medical History:  Diagnosis Date  . Arthritis    fingers  . 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 Chicken pox   . Dysrhythmia 09/2013   Hx. a-fib x 1 episode patient states he "auto corrected" seen at [facility]  . GERD (gastroesophageal reflux disease)   . Headache    poor posture - none recently  . PSVT (paroxysmal supraventricular tachycardia) ([place]) 2009   Controlled with "breathing process"  . Renal stone 9/08, 6/15  . Wears dentures    full upper    Past Surgical History:  Procedure Laterality Date  . CARDIAC CATHETERIZATION  [address], [place]  . CATARACT EXTRACTION W/PHACO Left 07/27/2015   Procedure: CATARACT EXTRACTION PHACO AND INTRAOCULAR LENS PLACEMENT (IOC);  Surgeon: Leandrew Koyanagi, MD;  Location: [place];  Service: Ophthalmology;  Laterality: Left;  TORIC  . CATARACT EXTRACTION W/PHACO Right 08/24/2015   Procedure: CATARACT EXTRACTION PHACO AND INTRAOCULAR LENS PLACEMENT (IOC);  Surgeon: Leandrew Koyanagi, MD;  Location: [place];  Service: Ophthalmology;  Laterality: Right;  TORIC  . HEMORROIDECTOMY  2010   banding  . KIDNEY STONE SURGERY  9/08, 6/15   lithotripsy  . SKIN CANCER  EXCISION    . VASECTOMY      There were no vitals filed for this visit.  Subjective Assessment - 07/24/17 0907    Subjective  Patient is concerned because he has been coming now for 6 treatments and  he doesnt feel like he is getting better. His AROM was measured and PROM was measured and he has had    Pertinent History  Patient was seen by this therapist on 1/28 with referral for back pain; indicated at that time that his back pain was not a problem but he wanted to be seen for his shoulder. Returned today for shoulder evaluation. Patient reported 3 falls in last year; most recent due to "clumsiness" on Jan 4 during a trip to [place]; fell "hard" onto concrete patio. Could not sleep secondary to pain, went to hospital; received x-rays and medications; x-rays apparently negative but have not been received by PCP at this time. The patient's back and hip were also injured in the fall but the shoulder is his primary complaint presenting with pain with overhead motion; the pain has not improved since the fall. Patient denies prior shoulder problems, N/T. He is having difficulty holding things. Reports that he is scoliotic and his L shoulder has always been elevated relative to his R shoulder.    Limitations  House hold activities;Lifting    How long can you sit  comfortably?  n/a    How long can you stand comfortably?  15 minutes    How long can you walk comfortably?  1 hr    Diagnostic tests  X-ray (not on file; pt reports negative xrays)    Patient Stated Goals  Patient wants to perform ADLs without pain; and be able to perform lifting, getting things off shelves without pain    Currently in Pain?  Yes    Pain Score  3     Pain Location  Shoulder    Pain Orientation  Left    Pain Descriptors / Indicators  Aching    Pain Onset  Other (comment) Chronic (years) since retiring from [employer], exacerbated with fall    Pain Frequency  Constant    Aggravating Factors   movement    Pain Relieving Factors   rest and medicine    Pain Onset  More than a month ago    Pain Onset  More than a month ago       Treatment: LUE shoulder :   AROM abd 0-72 pain beginning at 70 deg, PROM 0-155 deg after treatment AROM increased to 82 deg  LUE AROM  flex pain begins 85 degs, PROM 0-155, after treatment AROM increased to 83 deg   Manual therapy: T 4- T 12 PA glide , grade 3 and 4 x 30 bouts Scapula mobilizations elevation/depression, med/lat with PROM to left shoulder in painfree range  Therapeutic exercise: Shoulder ranger flex/ext, flex with shoulder abd/add  X 20  PROM to LUE shoulder flex and abd with traction x 10 reps Supine cane exercises: Bench press 1 lb wieght x 20 Elbows straight  to 90 deg towards ceiling x 20 , towards ears Elbows straight and cane 90 degs towards ceiling and horizontal hip abd/ add in pain free range Scapula retraction x 20 with 5 sec hold  Supine scapula protraction x 20 sidelying with towell roll under arm and ER towards ceiling , 0 weight and 1 lb sidleying flex x 10 without assist , 20 with AAROM  Supine left UE abd x 10 without assist x 2 sidelying with table and left shoulder flex x 20   Reviewed HEP and added above exercises                        PT Education - 07/24/17 0910    Education provided  Yes    Education Details  how to measure improvements in shoulder with AROM and PROM and pain     Person(s) Educated  Patient    Methods  Explanation    Comprehension  Verbalized understanding       PT Short Term Goals - 07/22/17 1110      PT SHORT TERM GOAL #1   Title  Patient will demonstrate independence with home exercise program to develop upper extremity functional mobility.    Baseline  patient is doing his HEP    Time  2    Period  Weeks    Status  Achieved      PT SHORT TERM GOAL #2   Title  Patient will improve active L shoulder flexion to 70 and abduction to 70 to demonstrate gains in functional upper extremity  mobility.    Baseline  Active L shoulder flexion 90, abduction 72    Time  2    Period  Weeks    Status  Achieved        PT  Long Term Goals - 07/22/17 1106      PT LONG TERM GOAL #1   Title  Patient will demonstrate normal L scapular position at rest for improved upper extremity mobility and strength.    Baseline  L scapula elevated at rest.    Time  8    Period  Weeks    Status  Partially Met    Target Date  08/14/17      PT LONG TERM GOAL #2   Title  Patient will demonstrate L grip strength of 85% of grip strength on R to demonstrate return to functional strength in left upper extremity.    Baseline  L hand 73% strength of R    Time  8    Period  Weeks    Status  Partially Met      PT LONG TERM GOAL #3   Title  Patient will reduce QuickDASH score to 15% to demonstrate reduced upper extremity disability.    Baseline  QuickDASH 38%    Time  8    Period  Weeks    Status  Partially Met      PT LONG TERM GOAL #4   Title  Patient will demonstrate active L shoulder flexion to 120 and abduction to 130 to demonstrate full functional range in the upper extremity.    Baseline  Active L shoulder flexion 82, abduction 72    Time  8    Period  Weeks    Status  Partially Met    Target Date  08/14/17            Plan - 07/24/17 0911    Clinical Impression Statement  AROM and PROM measurements of left shoulder flex and abd indicate marked improvements in ROM. He performs AROM with cane and 1 lb with pain that is minimal . He reponds to manual therapy for thoracic spine and left shoulder. His left shoulder HEP was reviewed and techniques reviewed to perform them correctly. He will continue to benefit from skilled PT to improve strength, ROM and decrease pain to left shoulder.     Rehab Potential  Good    Clinical Impairments Affecting Rehab Potential  + motivated, good general health, family support, acute injury / - pain not improving, age    PT Frequency  2x / week    PT  Duration  8 weeks    PT Treatment/Interventions  Aquatic Therapy;Cryotherapy;Electrical Stimulation;Moist Heat;Traction;Ultrasound;Gait training;Functional mobility training;Therapeutic activities;Therapeutic exercise;Patient/family education;Neuromuscular re-education;Balance training;Manual techniques;Dry needling;Passive range of motion;Energy conservation;Taping    PT Next Visit Plan  Shoulder ROM, strengthening    Consulted and Agree with Plan of Care  Patient       Patient will benefit from skilled therapeutic intervention in order to improve the following deficits and impairments:  Pain, Postural dysfunction, Hypomobility, Decreased strength, Decreased range of motion, Decreased activity tolerance, Difficulty walking, Impaired perceived functional ability, Impaired flexibility, Impaired UE functional use, Decreased mobility  Visit Diagnosis: Acute pain of left shoulder  Muscle weakness (generalized)     Problem List Patient Active Problem List   Diagnosis Date Noted  . Fall with injury 05/22/2017  . AAA (abdominal aortic aneurysm) ([place]) 05/22/2017  . Frequent headaches 01/15/2017  . Osteoarthritis 07/13/2016  . Plantar fasciitis 07/13/2016  . Encounter for abdominal aortic aneurysm (AAA) screening 02/17/2016  . Hyperlipidemia 11/27/2015  . Chronic right-sided thoracic back pain 11/27/2015  . Chronic fatigue 11/27/2015  . SVT (supraventricular tachycardia) ([place]) 09/16/2015  . Paroxysmal atrial fibrillation (  [place]) 09/16/2015  . 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 Esophageal reflux 09/16/2015  . Preventative health care 09/16/2015    Alanson Puls, [place] DPT 07/24/2017, 9:17 AM  [place] MAIN [facility] SERVICES [address] [place], [place], [address] Phone: [phone]   Fax:  [phone]  Name: Adam Juarez MRN: [id number] Date of Birth: 12-12-1940

## 2017-07-25 ENCOUNTER — Encounter: Payer: Medicare Other | Admitting: Physical Therapy

## 2017-07-26 ENCOUNTER — Encounter: Payer: Medicare Other | Admitting: Registered"

## 2017-07-26 ENCOUNTER — Encounter: Payer: Self-pay | Admitting: Registered"

## 2017-07-26 DIAGNOSIS — R7303 Prediabetes: Secondary | ICD-10-CM | POA: Diagnosis not present

## 2017-07-26 NOTE — Progress Notes (Signed)
Patient was seen on 07/26/17 for the Core Session 6 of Diabetes Prevention Program course at [facility]. By the end of this session patients are able to complete the following objectives:   Learning Objectives:  Graph their daily physical activity.   Describe two ways of finding the time to be active.   Define "lifestyle activity."   Describe how to prevent injury.   Develop an activity plan for the coming week.   Goals:   Record weight taken outside of class.   Track foods and beverages eaten each day in the "Food and Activity Tracker," including calories and fat grams for each item.    Track activity type, minutes you were active, and distance you reached each day in the "Food and Activity Tracker."   Set aside one 20 to 30-minute block of time every day or find two or more periods of 10 to15 minutes each for physical activity.   Warm up, cool down, and stretch.  Make a Physical Activities Plan for the Week.   Follow-Up Plan:  Attend Core Session 7 next week.   Bring completed "Food and Activity Tracker" next week to be reviewed by Lifestyle Coach.

## 2017-07-29 ENCOUNTER — Ambulatory Visit: Payer: Medicare Other | Admitting: Physical Therapy

## 2017-07-29 ENCOUNTER — Encounter: Payer: Self-pay | Admitting: Physical Therapy

## 2017-07-29 DIAGNOSIS — M6281 Muscle weakness (generalized): Secondary | ICD-10-CM | POA: Diagnosis not present

## 2017-07-29 DIAGNOSIS — M25512 Pain in left shoulder: Secondary | ICD-10-CM | POA: Diagnosis not present

## 2017-07-29 NOTE — Therapy (Signed)
[place] MAIN [facility] SERVICES [address] [place], [place], [address] Phone: [phone]   Fax:  [phone]  Physical Therapy Treatment  Patient Details  Name: Adam Juarez MRN: [id number] Date of Birth: 02-Nov-1940 Referring Provider: Pleas Koch, NP   Encounter Date: 07/29/2017  PT End of Session - 07/29/17 1437    Visit Number  8    Number of Visits  17    Date for PT Re-Evaluation  08/21/17    PT Start Time  0232    PT Stop Time  0310    PT Time Calculation (min)  38 min    Equipment Utilized During Treatment  Gait belt    Activity Tolerance  Patient tolerated treatment well;Patient limited by pain    Behavior During Therapy  [facility] for tasks assessed/performed       Past Medical History:  Diagnosis Date  . Arthritis    fingers  . 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 Chicken pox   . Dysrhythmia 09/2013   Hx. a-fib x 1 episode patient states he "auto corrected" seen at [facility]  . GERD (gastroesophageal reflux disease)   . Headache    poor posture - none recently  . PSVT (paroxysmal supraventricular tachycardia) ([place]) 2009   Controlled with "breathing process"  . Renal stone 9/08, 6/15  . Wears dentures    full upper    Past Surgical History:  Procedure Laterality Date  . CARDIAC CATHETERIZATION  [address] [place]  . CATARACT EXTRACTION W/PHACO Left 07/27/2015   Procedure: CATARACT EXTRACTION PHACO AND INTRAOCULAR LENS PLACEMENT (IOC);  Surgeon: Leandrew Koyanagi, MD;  Location: [place];  Service: Ophthalmology;  Laterality: Left;  TORIC  . CATARACT EXTRACTION W/PHACO Right 08/24/2015   Procedure: CATARACT EXTRACTION PHACO AND INTRAOCULAR LENS PLACEMENT (IOC);  Surgeon: Leandrew Koyanagi, MD;  Location: [place];  Service: Ophthalmology;  Laterality: Right;  TORIC  . HEMORROIDECTOMY  2010   banding  . KIDNEY STONE SURGERY  9/08, 6/15   lithotripsy  . SKIN CANCER  EXCISION    . VASECTOMY      There were no vitals filed for this visit.  Subjective Assessment - 07/29/17 1430    Subjective  Patient reports that his shoulder is improving and is a 3/10 today.     Pertinent History  Patient was seen by this therapist on 1/28 with referral for back pain; indicated at that time that his back pain was not a problem but he wanted to be seen for his shoulder. Returned today for shoulder evaluation. Patient reported 3 falls in last year; most recent due to "clumsiness" on Jan 4 during a trip to [place]; fell "hard" onto concrete patio. Could not sleep secondary to pain, went to hospital; received x-rays and medications; x-rays apparently negative but have not been received by PCP at this time. The patient's back and hip were also injured in the fall but the shoulder is his primary complaint presenting with pain with overhead motion; the pain has not improved since the fall. Patient denies prior shoulder problems, N/T. He is having difficulty holding things. Reports that he is scoliotic and his L shoulder has always been elevated relative to his R shoulder.    Limitations  House hold activities;Lifting    How long can you sit comfortably?  n/a    How long can you stand comfortably?  15 minutes    How long can  you walk comfortably?  1 hr    Diagnostic tests  X-ray (not on file; pt reports negative xrays)    Patient Stated Goals  Patient wants to perform ADLs without pain; and be able to perform lifting, getting things off shelves without pain    Currently in Pain?  Yes    Pain Score  3     Pain Location  Shoulder    Pain Orientation  Left    Pain Descriptors / Indicators  Aching    Pain Type  Chronic pain    Pain Frequency  Constant    Aggravating Factors   movement    Pain Relieving Factors  rest and medicine    Effect of Pain on Daily Activities  inactivity    Multiple Pain Sites  No        Treatment:  Prior to therapy: AROM shoulder flex 75 deg left  shoulder AROM shoulder abd 104 deg left shoulder   Arm ranger left including horizontal abd/add, and flex and ext x 20   UBE x 5 mins 2 1/2 fwd, 2 1/2 bwd   Supine LUE abd to 90 deg x 20 reps  Supine  LUE ceiling punch x 20 reps  Left shoulder sidelying ER x 20 with 40 deg abd   sidelying left shoulder abd wiithout weight x 20   sidelying left shoulder flex without table for support x 20   Supine  shoulder serratus punch 1 # 2 x 10;  Supine canes for flexion  X 20 reps  Supine left shoulder flex , no weight , no cane x 20 reps   Finger ladder x 5  Sidelying shoulder flex with 1 lbs x 20   Seated RTB rows 2 x 10, cues for correct technique  Patient needs cues for correct technique and good posture.                         PT Education - 07/29/17 1436    Education provided  Yes    Education Details  AROM measurements    Person(s) Educated  Patient    Methods  Explanation    Comprehension  Verbalized understanding       PT Short Term Goals - 07/22/17 1110      PT SHORT TERM GOAL #1   Title  Patient will demonstrate independence with home exercise program to develop upper extremity functional mobility.    Baseline  patient is doing his HEP    Time  2    Period  Weeks    Status  Achieved      PT SHORT TERM GOAL #2   Title  Patient will improve active L shoulder flexion to 70 and abduction to 70 to demonstrate gains in functional upper extremity mobility.    Baseline  Active L shoulder flexion 90, abduction 72    Time  2    Period  Weeks    Status  Achieved        PT Long Term Goals - 07/22/17 1106      PT LONG TERM GOAL #1   Title  Patient will demonstrate normal L scapular position at rest for improved upper extremity mobility and strength.    Baseline  L scapula elevated at rest.    Time  8    Period  Weeks    Status  Partially Met    Target Date  08/14/17      PT LONG  TERM GOAL #2   Title  Patient will demonstrate L grip  strength of 85% of grip strength on R to demonstrate return to functional strength in left upper extremity.    Baseline  L hand 73% strength of R    Time  8    Period  Weeks    Status  Partially Met      PT LONG TERM GOAL #3   Title  Patient will reduce QuickDASH score to 15% to demonstrate reduced upper extremity disability.    Baseline  QuickDASH 38%    Time  8    Period  Weeks    Status  Partially Met      PT LONG TERM GOAL #4   Title  Patient will demonstrate active L shoulder flexion to 120 and abduction to 130 to demonstrate full functional range in the upper extremity.    Baseline  Active L shoulder flexion 82, abduction 72    Time  8    Period  Weeks    Status  Partially Met    Target Date  08/14/17              Patient will benefit from skilled therapeutic intervention in order to improve the following deficits and impairments:     Visit Diagnosis: Acute pain of left shoulder  Muscle weakness (generalized)     Problem List Patient Active Problem List   Diagnosis Date Noted  . Fall with injury 05/22/2017  . AAA (abdominal aortic aneurysm) ([place]) 05/22/2017  . Frequent headaches 01/15/2017  . Osteoarthritis 07/13/2016  . Plantar fasciitis 07/13/2016  . Encounter for abdominal aortic aneurysm (AAA) screening 02/17/2016  . Hyperlipidemia 11/27/2015  . Chronic right-sided thoracic back pain 11/27/2015  . Chronic fatigue 11/27/2015  . SVT (supraventricular tachycardia) ([place]) 09/16/2015  . Paroxysmal atrial fibrillation ([place]) 09/16/2015  . 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 Esophageal reflux 09/16/2015  . Preventative health care 09/16/2015    Alanson Puls, Pt DPT 07/29/2017, 2:38 PM  [place] MAIN [facility] SERVICES [address] [place], [place], [address] Phone: [phone]   Fax:  [phone]  Name: Adam Juarez MRN: [id number] Date of Birth: [birth year]

## 2017-07-30 ENCOUNTER — Encounter: Payer: Medicare Other | Admitting: Physical Therapy

## 2017-07-31 ENCOUNTER — Ambulatory Visit: Payer: Medicare Other | Admitting: Physical Therapy

## 2017-07-31 ENCOUNTER — Encounter: Payer: Self-pay | Admitting: Physical Therapy

## 2017-07-31 DIAGNOSIS — M25512 Pain in left shoulder: Secondary | ICD-10-CM | POA: Diagnosis not present

## 2017-07-31 DIAGNOSIS — M6281 Muscle weakness (generalized): Secondary | ICD-10-CM

## 2017-07-31 NOTE — Therapy (Signed)
[place] MAIN [facility] SERVICES [address] [place], [place], [address] Phone: [phone]   Fax:  [phone]  Physical Therapy Treatment  Patient Details  Name: Adam Juarez MRN: [id number] Date of Birth: [birth year] Referring Provider: Pleas Koch, NP   Encounter Date: 07/31/2017  PT End of Session - 07/31/17 0846    Visit Number  9    Number of Visits  17    Date for PT Re-Evaluation  08/21/17    PT Start Time  0845    PT Stop Time  0930    PT Time Calculation (min)  45 min    Equipment Utilized During Treatment  Gait belt    Activity Tolerance  Patient tolerated treatment well;Patient limited by pain    Behavior During Therapy  [facility] for tasks assessed/performed       Past Medical History:  Diagnosis Date  . Arthritis    fingers  . 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 Chicken pox   . Dysrhythmia 09/2013   Hx. a-fib x 1 episode patient states he "auto corrected" seen at [facility]  . GERD (gastroesophageal reflux disease)   . Headache    poor posture - none recently  . PSVT (paroxysmal supraventricular tachycardia) ([place]) 2009   Controlled with "breathing process"  . Renal stone 9/08, 6/15  . Wears dentures    full upper    Past Surgical History:  Procedure Laterality Date  . CARDIAC CATHETERIZATION  [address], [place]  . CATARACT EXTRACTION W/PHACO Left 07/27/2015   Procedure: CATARACT EXTRACTION PHACO AND INTRAOCULAR LENS PLACEMENT (IOC);  Surgeon: Leandrew Koyanagi, MD;  Location: [place];  Service: Ophthalmology;  Laterality: Left;  TORIC  . CATARACT EXTRACTION W/PHACO Right 08/24/2015   Procedure: CATARACT EXTRACTION PHACO AND INTRAOCULAR LENS PLACEMENT (IOC);  Surgeon: Leandrew Koyanagi, MD;  Location: [place];  Service: Ophthalmology;  Laterality: Right;  TORIC  . HEMORROIDECTOMY  2010   banding  . KIDNEY STONE SURGERY  9/08, 6/15   lithotripsy  . SKIN CANCER  EXCISION    . VASECTOMY      There were no vitals filed for this visit.  Subjective Assessment - 07/31/17 0907    Subjective  Patient reports that his shoulder is improving and is a 5/10 today. It is difficulty to measure the pain due to it dependes when he is using it.     Pertinent History  Patient was seen by this therapist on 1/28 with referral for back pain; indicated at that time that his back pain was not a problem but he wanted to be seen for his shoulder. Returned today for shoulder evaluation. Patient reported 3 falls in last year; most recent due to "clumsiness" on Jan 4 during a trip to [place]; fell "hard" onto concrete patio. Could not sleep secondary to pain, went to hospital; received x-rays and medications; x-rays apparently negative but have not been received by PCP at this time. The patient's back and hip were also injured in the fall but the shoulder is his primary complaint presenting with pain with overhead motion; the pain has not improved since the fall. Patient denies prior shoulder problems, N/T. He is having difficulty holding things. Reports that he is scoliotic and his L shoulder has always been elevated relative to his R shoulder.    Limitations  House hold activities;Lifting    How long can you sit comfortably?  n/a  How long can you stand comfortably?  15 minutes    How long can you walk comfortably?  1 hr    Diagnostic tests  X-ray (not on file; pt reports negative xrays)    Patient Stated Goals  Patient wants to perform ADLs without pain; and be able to perform lifting, getting things off shelves without pain    Currently in Pain?  Yes    Pain Score  5     Pain Location  Shoulder    Pain Orientation  Left    Pain Descriptors / Indicators  Aching    Pain Type  Chronic pain    Pain Onset  Other (comment) Chronic (years) since retiring from [employer], exacerbated with fall    Pain Frequency  Constant    Aggravating Factors   movement above the shoulder    Pain  Relieving Factors  rest and medicine    Effect of Pain on Daily Activities  unable to perform some activities    Multiple Pain Sites  No    Pain Onset  More than a month ago    Pain Onset  More than a month ago                  Treatment:  Prior to therapy: AROM shoulder flex 105 deg left shoulder AROM shoulder abd 85 deg left shoulder   Arm ranger left including horizontal abd/add, and flex and ext x 20   UBE x 5 mins 2 1/2 fwd, 2 1/2 bwd easiest setting   Supine LUE abd to 90 deg x 20 reps, no weight   Supine  LUE ceiling punch x 20 reps  sidelying abd without weight x 20 reps   sidelying left shoulder flex without table for support x 20   Supine  shoulder serratus punch 1 # cane  X 20  Supine canes for flexion  BUE hips to ear 1 lb weight on cane X 20 reps  Supine left shoulder flex , no weight , no cane x 20 reps     Matrix 2. 5 lbs standing left shoulder ER with pillow under arm to create a space x 10   Matrix scapula retraction with 7. 5 lbs x 20 BUE   Seated RTB rows x 20 , cues for correct technique  Patient needs cues for correct technique and good posture             PT Education - 07/31/17 0845    Education provided  Yes    Education Details  How to measure progress with his left shoulder dx: AROM and PROM and pain    Person(s) Educated  Patient    Methods  Explanation;Demonstration;Tactile cues;Verbal cues    Comprehension  Verbalized understanding;Returned demonstration;Verbal cues required       PT Short Term Goals - 07/22/17 1110      PT SHORT TERM GOAL #1   Title  Patient will demonstrate independence with home exercise program to develop upper extremity functional mobility.    Baseline  patient is doing his HEP    Time  2    Period  Weeks    Status  Achieved      PT SHORT TERM GOAL #2   Title  Patient will improve active L shoulder flexion to 70 and abduction to 70 to demonstrate gains in functional upper  extremity mobility.    Baseline  Active L shoulder flexion 90, abduction 72    Time  2  Period  Weeks    Status  Achieved        PT Long Term Goals - 07/22/17 1106      PT LONG TERM GOAL #1   Title  Patient will demonstrate normal L scapular position at rest for improved upper extremity mobility and strength.    Baseline  L scapula elevated at rest.    Time  8    Period  Weeks    Status  Partially Met    Target Date  08/14/17      PT LONG TERM GOAL #2   Title  Patient will demonstrate L grip strength of 85% of grip strength on R to demonstrate return to functional strength in left upper extremity.    Baseline  L hand 73% strength of R    Time  8    Period  Weeks    Status  Partially Met      PT LONG TERM GOAL #3   Title  Patient will reduce QuickDASH score to 15% to demonstrate reduced upper extremity disability.    Baseline  QuickDASH 38%    Time  8    Period  Weeks    Status  Partially Met      PT LONG TERM GOAL #4   Title  Patient will demonstrate active L shoulder flexion to 120 and abduction to 130 to demonstrate full functional range in the upper extremity.    Baseline  Active L shoulder flexion 82, abduction 72    Time  8    Period  Weeks    Status  Partially Met    Target Date  08/14/17            Plan - 07/31/17 0909    Clinical Impression Statement  Patient consistently thinks that he is not improving at the beginning of each session . Patient is educated each visit about his AROM measurements of left shoulder flex 0- 105 and abd 0-85 indicate marked improvements in ROM. He performs AROM with cane and 1 lb with pain that is minimal . He is able to perform some exercises in gravity position to challenge his strength His left shoulder HEP was reviewed and techniques reviewed to perform them correctly. He began to have right thoracic pain that caused him to have to sit up and end the exercises abruptly. He will continue to benefit from skilled PT to improve  strength, ROM and decrease pain to left shoulder.     Rehab Potential  Good    Clinical Impairments Affecting Rehab Potential  + motivated, good general health, family support, acute injury / - pain not improving, age    PT Frequency  2x / week    PT Duration  8 weeks    PT Treatment/Interventions  Aquatic Therapy;Cryotherapy;Electrical Stimulation;Moist Heat;Traction;Ultrasound;Gait training;Functional mobility training;Therapeutic activities;Therapeutic exercise;Patient/family education;Neuromuscular re-education;Balance training;Manual techniques;Dry needling;Passive range of motion;Energy conservation;Taping    PT Next Visit Plan  Shoulder ROM, strengthening    Consulted and Agree with Plan of Care  Patient       Patient will benefit from skilled therapeutic intervention in order to improve the following deficits and impairments:  Pain, Postural dysfunction, Hypomobility, Decreased strength, Decreased range of motion, Decreased activity tolerance, Difficulty walking, Impaired perceived functional ability, Impaired flexibility, Impaired UE functional use, Decreased mobility  Visit Diagnosis: Acute pain of left shoulder  Muscle weakness (generalized)     Problem List Patient Active Problem List   Diagnosis Date Noted  . Fall with  injury 05/22/2017  . AAA (abdominal aortic aneurysm) ([place]) 05/22/2017  . Frequent headaches 01/15/2017  . Osteoarthritis 07/13/2016  . Plantar fasciitis 07/13/2016  . Encounter for abdominal aortic aneurysm (AAA) screening 02/17/2016  . Hyperlipidemia 11/27/2015  . Chronic right-sided thoracic back pain 11/27/2015  . Chronic fatigue 11/27/2015  . SVT (supraventricular tachycardia) ([place]) 09/16/2015  . Paroxysmal atrial fibrillation ([place]) 09/16/2015  . 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 Esophageal reflux 09/16/2015  . Preventative health care 09/16/2015    Alanson Puls, PT DPT 07/31/2017, 9:32 AM  [place] MAIN  [facility] SERVICES [address] [place], [place], [address] Phone: [phone]   Fax:  [phone]  Name: Colonel Krauser MRN: [id number] Date of Birth: 05/04/1941

## 2017-08-01 ENCOUNTER — Encounter: Payer: Medicare Other | Admitting: Physical Therapy

## 2017-08-02 ENCOUNTER — Encounter: Payer: Self-pay | Admitting: Registered"

## 2017-08-02 ENCOUNTER — Encounter: Payer: Medicare Other | Admitting: Registered"

## 2017-08-02 DIAGNOSIS — R7303 Prediabetes: Secondary | ICD-10-CM

## 2017-08-02 NOTE — Progress Notes (Signed)
Patient was seen on 08/02/17 for the Core Session 7 of Diabetes Prevention Program course at [facility]. By the end of this session patients are able to complete the following objectives:   Learning Objectives:  Define calorie balance.  Explain how healthy eating and being active are related in terms of calorie balance.   Describe the relationship between calorie balance and weight loss.   Describe his or her progress as it relates to calorie balance.   Develop an activity plan for the coming week.   Goals:   Record weight taken outside of class.   Track foods and beverages eaten each day in the "Food and Activity Tracker," including calories and fat grams for each item.    Track activity type, minutes you were active, and distance you reached each day in the "Food and Activity Tracker."   Set aside one 20 to 30-minute block of time every day or find two or more periods of 10 to15 minutes each for physical activity.   Make a Physical Activities Plan for the Week.   Make active lifestyle choices all through the day   Stay at or go slightly over activity goal.   Follow-Up Plan:  Attend Core Session 8 next week.   Bring completed "Food and Activity Tracker" next week to be reviewed by Lifestyle Coach.

## 2017-08-05 ENCOUNTER — Ambulatory Visit: Payer: Medicare Other

## 2017-08-05 VITALS — BP 132/80 | HR 76

## 2017-08-05 DIAGNOSIS — M25512 Pain in left shoulder: Secondary | ICD-10-CM | POA: Diagnosis not present

## 2017-08-05 DIAGNOSIS — M6281 Muscle weakness (generalized): Secondary | ICD-10-CM

## 2017-08-05 NOTE — Therapy (Signed)
[facility] MAIN REHAB SERVICES [address] [place], [place], [address] Phone: [phone]   Fax:  [phone]  Physical Therapy Treatment  Patient Details  Name: Adam Juarez MRN: [id number] Date of Birth: 04/04/1941 Referring Provider: Clark, Katherine K, NP   Encounter Date: 08/05/2017  PT End of Session - 08/05/17 1530    Visit Number  10    Number of Visits  17    Date for PT Re-Evaluation  08/21/17    PT Start Time  1435    PT Stop Time  1515    PT Time Calculation (min)  40 min    Equipment Utilized During Treatment  Gait belt    Activity Tolerance  Patient tolerated treatment well;Patient limited by pain    Behavior During Therapy  WFL for tasks assessed/performed       Past Medical History:  Diagnosis Date  . Arthritis    fingers  . 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 Chicken pox   . Dysrhythmia 09/2013   Hx. a-fib x 1 episode patient states he "auto corrected" seen at [facility]  . GERD (gastroesophageal reflux disease)   . Headache    poor posture - none recently  . PSVT (paroxysmal supraventricular tachycardia) (HCC) 2009   Controlled with "breathing process"  . Renal stone 9/08, 6/15  . Wears dentures    full upper    Past Surgical History:  Procedure Laterality Date  . CARDIAC CATHETERIZATION  2008   [place]  . CATARACT EXTRACTION W/PHACO Left 07/27/2015   Procedure: CATARACT EXTRACTION PHACO AND INTRAOCULAR LENS PLACEMENT (IOC);  Surgeon: Chadwick Brasington, MD;  Location: [facility];  Service: Ophthalmology;  Laterality: Left;  TORIC  . CATARACT EXTRACTION W/PHACO Right 08/24/2015   Procedure: CATARACT EXTRACTION PHACO AND INTRAOCULAR LENS PLACEMENT (IOC);  Surgeon: Chadwick Brasington, MD;  Location: [facility];  Service: Ophthalmology;  Laterality: Right;  TORIC  . HEMORROIDECTOMY  2010   banding  . KIDNEY STONE SURGERY  9/08, 6/15   lithotripsy  . SKIN CANCER  EXCISION    . VASECTOMY      Vitals:   08/05/17 1435  BP: 132/80  Pulse: 76  SpO2: 97%    Subjective Assessment - 08/05/17 1431    Subjective  Pt states that he is doing well today. He denies any shoulder pain at rest currently. He reports feeling like he is making progress some days and other days is frustrated by persistent pain. No specific questions or concerns currently.     Pertinent History  Patient was seen by this therapist on 1/28 with referral for back pain; indicated at that time that his back pain was not a problem but he wanted to be seen for his shoulder. Returned today for shoulder evaluation. Patient reported 3 falls in last year; most recent due to "clumsiness" on Jan 4 during a trip to [place]; fell "hard" onto concrete patio. Could not sleep secondary to pain, went to hospital; received x-rays and medications; x-rays apparently negative but have not been received by PCP at this time. The patient's back and hip were also injured in the fall but the shoulder is his primary complaint presenting with pain with overhead motion; the pain has not improved since the fall. Patient denies prior shoulder problems, N/T. He is having difficulty holding things. Reports that he is scoliotic and his L shoulder has always been elevated relative to his R shoulder.      Limitations  House hold activities;Lifting    How long can you sit comfortably?  n/a    How long can you stand comfortably?  15 minutes    How long can you walk comfortably?  1 hr    Diagnostic tests  X-ray (not on file; pt reports negative xrays)    Patient Stated Goals  Patient wants to perform ADLs without pain; and be able to perform lifting, getting things off shelves without pain    Currently in Pain?  No/denies    Pain Onset  --    Pain Onset  --    Pain Onset  --                No data recorded   TREATMENT  Ther-ex UE ranger left for flexion, pt reports subacromial pain so provided upward rotation MWM  and pain abolishes x 20; UE ranger for left horizontal abd/add x 20;  Supine LUE flexion to 90 deg 2# 2 x 10; Supine serratus punch 2# 2 x 10; R sidelying L shoulder ER 2# 2 x 10; R sidelying L shoulder abduction 2# 2 x 10;  Manual Therapy  L shoulder posterior capsule cross-body stretch 30s hold x 2; Posterior and inferior grade III mobilizations at 110 flexion near painful arc, 30s/bout x 3 bouts; Inferior grade III mobilizations at 90 abduction, 30s/bout x 3 bouts; Distraction mobilization progressing through PROM; Attempted AP mobilizations at 90 abduction and end range ER but discontinued due to pain;  Patient needs cues for correct technique and good posture             PT Education - 08/05/17 1530    Education provided  Yes    Education Details  Plan of care, prognosis    Person(s) Educated  Patient    Methods  Explanation    Comprehension  Verbalized understanding       PT Short Term Goals - 07/22/17 1110      PT SHORT TERM GOAL #1   Title  Patient will demonstrate independence with home exercise program to develop upper extremity functional mobility.    Baseline  patient is doing his HEP    Time  2    Period  Weeks    Status  Achieved      PT SHORT TERM GOAL #2   Title  Patient will improve active L shoulder flexion to 70 and abduction to 70 to demonstrate gains in functional upper extremity mobility.    Baseline  Active L shoulder flexion 90, abduction 72    Time  2    Period  Weeks    Status  Achieved        PT Long Term Goals - 07/22/17 1106      PT LONG TERM GOAL #1   Title  Patient will demonstrate normal L scapular position at rest for improved upper extremity mobility and strength.    Baseline  L scapula elevated at rest.    Time  8    Period  Weeks    Status  Partially Met    Target Date  08/14/17      PT LONG TERM GOAL #2   Title  Patient will demonstrate L grip strength of 85% of grip strength on R to demonstrate return to  functional strength in left upper extremity.    Baseline  L hand 73% strength of R    Time  8    Period  Weeks    Status    Partially Met      PT LONG TERM GOAL #3   Title  Patient will reduce QuickDASH score to 15% to demonstrate reduced upper extremity disability.    Baseline  QuickDASH 38%    Time  8    Period  Weeks    Status  Partially Met      PT LONG TERM GOAL #4   Title  Patient will demonstrate active L shoulder flexion to 120 and abduction to 130 to demonstrate full functional range in the upper extremity.    Baseline  Active L shoulder flexion 82, abduction 72    Time  8    Period  Weeks    Status  Partially Met    Target Date  08/14/17            Plan - 08/05/17 1531    Clinical Impression Statement  Pt has definite deficits in AROM compared to PROM due to weakness as well as pain which is concerning for RTC involvement. He also presents with clear signs of subacromial impingement. Therapist is able to abolish shoulder pain with scapular assist during active scaption. Pt will benefit from continued manual therapy and strengthening to improve L shoulder strength, decrease pain, and improve function.     Rehab Potential  Good    Clinical Impairments Affecting Rehab Potential  + motivated, good general health, family support, acute injury / - pain not improving, age    PT Frequency  2x / week    PT Duration  8 weeks    PT Treatment/Interventions  Aquatic Therapy;Cryotherapy;Electrical Stimulation;Moist Heat;Traction;Ultrasound;Gait training;Functional mobility training;Therapeutic activities;Therapeutic exercise;Patient/family education;Neuromuscular re-education;Balance training;Manual techniques;Dry needling;Passive range of motion;Energy conservation;Taping    PT Next Visit Plan  Shoulder ROM, strengthening    Consulted and Agree with Plan of Care  Patient       Patient will benefit from skilled therapeutic intervention in order to improve the following deficits and  impairments:  Pain, Postural dysfunction, Hypomobility, Decreased strength, Decreased range of motion, Decreased activity tolerance, Difficulty walking, Impaired perceived functional ability, Impaired flexibility, Impaired UE functional use, Decreased mobility  Visit Diagnosis: Acute pain of left shoulder  Muscle weakness (generalized)     Problem List Patient Active Problem List   Diagnosis Date Noted  . Fall with injury 05/22/2017  . AAA (abdominal aortic aneurysm) ([place]) 05/22/2017  . Frequent headaches 01/15/2017  . Osteoarthritis 07/13/2016  . Plantar fasciitis 07/13/2016  . Encounter for abdominal aortic aneurysm (AAA) screening 02/17/2016  . Hyperlipidemia 11/27/2015  . Chronic right-sided thoracic back pain 11/27/2015  . Chronic fatigue 11/27/2015  . SVT (supraventricular tachycardia) ([place]) 09/16/2015  . Paroxysmal atrial fibrillation ([place]) 09/16/2015  . 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 Esophageal reflux 09/16/2015  . Preventative health care 09/16/2015    Nicolas Banh 08/06/2017, 8:42 PM  [place] MAIN [facility] SERVICES [address] [place], [place], [address] Phone: [phone]   Fax:  [phone]  Name: Martino Tompson MRN: [id number] Date of Birth: [birth year]

## 2017-08-06 ENCOUNTER — Encounter: Payer: Medicare Other | Admitting: Physical Therapy

## 2017-08-07 ENCOUNTER — Ambulatory Visit: Payer: Medicare Other | Admitting: Physical Therapy

## 2017-08-08 ENCOUNTER — Encounter: Payer: Medicare Other | Admitting: Physical Therapy

## 2017-08-09 ENCOUNTER — Encounter: Payer: Self-pay | Admitting: Registered"

## 2017-08-09 ENCOUNTER — Encounter (HOSPITAL_BASED_OUTPATIENT_CLINIC_OR_DEPARTMENT_OTHER): Payer: Medicare Other | Admitting: Registered"

## 2017-08-09 DIAGNOSIS — R7303 Prediabetes: Secondary | ICD-10-CM

## 2017-08-09 NOTE — Progress Notes (Signed)
Patient was seen on 08/09/17 for the Core Session 8 of Diabetes Prevention Program course at [facility]. By the end of this session patients are able to complete the following objectives:   Learning Objectives:  Recognize positive and negative food and activity cues.   Change negative food and activity cues to positive cues.   Add positive cues for activity and eliminate cues for inactivity.   Develop a plan for removing one problem food cue for the coming week.   Goals:   Record weight taken outside of class.   Track foods and beverages eaten each day in the "Food and Activity Tracker," including calories and fat grams for each item.    Track activity type, minutes you were active, and distance you reached each day in the "Food and Activity Tracker."   Set aside one 20 to 30-minute block of time every day or find two or more periods of 10 to15 minutes each for physical activity.   Remove one problem food cue.   Add one positive cue for being more active.  Follow-Up Plan:  Attend Core Session 9 next week.   Bring completed "Food and Activity Tracker" next week to be reviewed by [facility].

## 2017-08-12 ENCOUNTER — Ambulatory Visit: Payer: Medicare Other | Attending: Primary Care | Admitting: Physical Therapy

## 2017-08-12 ENCOUNTER — Encounter: Payer: Self-pay | Admitting: Physical Therapy

## 2017-08-12 DIAGNOSIS — M25512 Pain in left shoulder: Secondary | ICD-10-CM | POA: Insufficient documentation

## 2017-08-12 DIAGNOSIS — M6281 Muscle weakness (generalized): Secondary | ICD-10-CM | POA: Insufficient documentation

## 2017-08-12 NOTE — Therapy (Signed)
[place] MAIN [facility] SERVICES [address] [place], [place], [address] Phone: [phone]   Fax:  [phone]  Physical Therapy Treatment  Patient Details  Name: Adam Juarez MRN: [id number] Date of Birth: 1940/07/11 Referring Provider: Pleas Koch, NP   Encounter Date: 08/12/2017  PT End of Session - 08/12/17 1447    Visit Number  11    Number of Visits  17    Date for PT Re-Evaluation  08/21/17    PT Start Time  0230    PT Stop Time  0315    PT Time Calculation (min)  45 min    Equipment Utilized During Treatment  Gait belt    Activity Tolerance  Patient tolerated treatment well;Patient limited by pain    Behavior During Therapy  [facility] for tasks assessed/performed       Past Medical History:  Diagnosis Date  . Arthritis    fingers  . 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 Chicken pox   . Dysrhythmia 09/2013   Hx. a-fib x 1 episode patient states he "auto corrected" seen at [facility]  . GERD (gastroesophageal reflux disease)   . Headache    poor posture - none recently  . PSVT (paroxysmal supraventricular tachycardia) ([place]) 2009   Controlled with "breathing process"  . Renal stone 9/08, 6/15  . Wears dentures    full upper    Past Surgical History:  Procedure Laterality Date  . CARDIAC CATHETERIZATION  [address] [place]  . CATARACT EXTRACTION W/PHACO Left 07/27/2015   Procedure: CATARACT EXTRACTION PHACO AND INTRAOCULAR LENS PLACEMENT (IOC);  Surgeon: Leandrew Koyanagi, MD;  Location: [place];  Service: Ophthalmology;  Laterality: Left;  TORIC  . CATARACT EXTRACTION W/PHACO Right 08/24/2015   Procedure: CATARACT EXTRACTION PHACO AND INTRAOCULAR LENS PLACEMENT (IOC);  Surgeon: Leandrew Koyanagi, MD;  Location: [place];  Service: Ophthalmology;  Laterality: Right;  TORIC  . HEMORROIDECTOMY  2010   banding  . KIDNEY STONE SURGERY  9/08, 6/15   lithotripsy  . SKIN CANCER  EXCISION    . VASECTOMY      There were no vitals filed for this visit.  Subjective Assessment - 08/12/17 1445    Subjective  Patient slept in a recliner due to a cold, and his arm is now a bit stiffer.     Pertinent History  Patient was seen by this therapist on 1/28 with referral for back pain; indicated at that time that his back pain was not a problem but he wanted to be seen for his shoulder. Returned today for shoulder evaluation. Patient reported 3 falls in last year; most recent due to "clumsiness" on Jan 4 during a trip to [place]; fell "hard" onto concrete patio. Could not sleep secondary to pain, went to hospital; received x-rays and medications; x-rays apparently negative but have not been received by PCP at this time. The patient's back and hip were also injured in the fall but the shoulder is his primary complaint presenting with pain with overhead motion; the pain has not improved since the fall. Patient denies prior shoulder problems, N/T. He is having difficulty holding things. Reports that he is scoliotic and his L shoulder has always been elevated relative to his R shoulder.    Limitations  House hold activities;Lifting    How long can you sit comfortably?  n/a    How long can you stand comfortably?  15 minutes  How long can you walk comfortably?  1 hr    Diagnostic tests  X-ray (not on file; pt reports negative xrays)    Patient Stated Goals  Patient wants to perform ADLs without pain; and be able to perform lifting, getting things off shelves without pain    Currently in Pain?  Yes    Pain Score  4     Pain Location  Shoulder    Pain Orientation  Left    Pain Descriptors / Indicators  Aching    Pain Type  Chronic pain    Pain Onset  Other (comment) Chronic (years) since retiring from [employer], exacerbated with fall    Pain Frequency  Constant    Aggravating Factors   movement above the shoudler    Pain Relieving Factors  rest and medicine    Effect of Pain on Daily  Activities  unable to perform some activities    Multiple Pain Sites  No    Pain Onset  More than a month ago    Pain Onset  More than a month ago             Treatment: Prior to therapy: Left shoulder abd 92 deg, flex 96 degs     Armranger left including horizontal abd/add, and flex and ext x 20   UBE x 5 mins 2 1/2 fwd, 2 1/2 bwd easiest setting   Supine LUE abd to 90 deg x 20 reps, no weight   Supine LUE ceiling punch x 20 reps  sidelying abd without weight x 20 reps   sidelying left shoulder flex without table for support x 20  Supine shoulder serratus punch 1# cane  X 20  Supine canes for flexion BUE hips to ear 1 lb weight on caneX 20 reps  Supine left shoulder flex with 1 lb  no cane x 20 reps  Matrix 2. 5 lbs standing left shoulder ER with pillow under arm to create a space x 10   Matrix scapula retraction with 7. 5 lbs x 20 BUE   Seated RTB rows x 20 , cues for correct technique  Patient needs cues for correct technique and good posture  Left shoulder abd 102 deg, flex 156 degs               PT Education - 08/12/17 1446    Education provided  Yes    Education Details  HEP    Person(s) Educated  Patient    Methods  Explanation;Demonstration;Tactile cues    Comprehension  Verbalized understanding;Returned demonstration       PT Short Term Goals - 07/22/17 1110      PT SHORT TERM GOAL #1   Title  Patient will demonstrate independence with home exercise program to develop upper extremity functional mobility.    Baseline  patient is doing his HEP    Time  2    Period  Weeks    Status  Achieved      PT SHORT TERM GOAL #2   Title  Patient will improve active L shoulder flexion to 70 and abduction to 70 to demonstrate gains in functional upper extremity mobility.    Baseline  Active L shoulder flexion 90, abduction 72    Time  2    Period  Weeks    Status  Achieved        PT Long Term Goals - 07/22/17 1106       PT LONG TERM GOAL #1  Title  Patient will demonstrate normal L scapular position at rest for improved upper extremity mobility and strength.    Baseline  L scapula elevated at rest.    Time  8    Period  Weeks    Status  Partially Met    Target Date  08/14/17      PT LONG TERM GOAL #2   Title  Patient will demonstrate L grip strength of 85% of grip strength on R to demonstrate return to functional strength in left upper extremity.    Baseline  L hand 73% strength of R    Time  8    Period  Weeks    Status  Partially Met      PT LONG TERM GOAL #3   Title  Patient will reduce QuickDASH score to 15% to demonstrate reduced upper extremity disability.    Baseline  QuickDASH 38%    Time  8    Period  Weeks    Status  Partially Met      PT LONG TERM GOAL #4   Title  Patient will demonstrate active L shoulder flexion to 120 and abduction to 130 to demonstrate full functional range in the upper extremity.    Baseline  Active L shoulder flexion 82, abduction 72    Time  8    Period  Weeks    Status  Partially Met    Target Date  08/14/17            Plan - 08/12/17 1447    Clinical Impression Statement  Patient performs PROM and AAROM in left UE in supine. He is able to manual therapy with increased AROM to left shoulder following treatment. He performs AROM with cane for HEP review. He is able to perform supine exercises without weight and work in painfree range. He will continue to benefit from skilled PT to imrpove strength and decrease pain.     Rehab Potential  Good    Clinical Impairments Affecting Rehab Potential  + motivated, good general health, family support, acute injury / - pain not improving, age    PT Frequency  2x / week    PT Duration  8 weeks    PT Treatment/Interventions  Aquatic Therapy;Cryotherapy;Electrical Stimulation;Moist Heat;Traction;Ultrasound;Gait training;Functional mobility training;Therapeutic activities;Therapeutic exercise;Patient/family  education;Neuromuscular re-education;Balance training;Manual techniques;Dry needling;Passive range of motion;Energy conservation;Taping    PT Next Visit Plan  Shoulder ROM, strengthening    Consulted and Agree with Plan of Care  Patient       Patient will benefit from skilled therapeutic intervention in order to improve the following deficits and impairments:  Pain, Postural dysfunction, Hypomobility, Decreased strength, Decreased range of motion, Decreased activity tolerance, Difficulty walking, Impaired perceived functional ability, Impaired flexibility, Impaired UE functional use, Decreased mobility  Visit Diagnosis: Acute pain of left shoulder  Muscle weakness (generalized)     Problem List Patient Active Problem List   Diagnosis Date Noted  . Fall with injury 05/22/2017  . AAA (abdominal aortic aneurysm) ([place]) 05/22/2017  . Frequent headaches 01/15/2017  . Osteoarthritis 07/13/2016  . Plantar fasciitis 07/13/2016  . Encounter for abdominal aortic aneurysm (AAA) screening 02/17/2016  . Hyperlipidemia 11/27/2015  . Chronic right-sided thoracic back pain 11/27/2015  . Chronic fatigue 11/27/2015  . SVT (supraventricular tachycardia) ([place]) 09/16/2015  . Paroxysmal atrial fibrillation ([place]) 09/16/2015  . 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 Esophageal reflux 09/16/2015  . Preventative health care 09/16/2015    Alanson Puls, PT DPT 08/12/2017, 2:50 PM  [place] MAIN [facility] SERVICES [address] [place], [place], [address] Phone: [phone]   Fax:  [phone]  Name: Adam Juarez MRN: [id number] Date of Birth: 1941/03/26

## 2017-08-14 ENCOUNTER — Ambulatory Visit: Payer: Medicare Other | Admitting: Physical Therapy

## 2017-08-14 ENCOUNTER — Encounter: Payer: Self-pay | Admitting: Physical Therapy

## 2017-08-14 DIAGNOSIS — M6281 Muscle weakness (generalized): Secondary | ICD-10-CM

## 2017-08-14 DIAGNOSIS — M25512 Pain in left shoulder: Secondary | ICD-10-CM

## 2017-08-14 NOTE — Addendum Note (Signed)
Addended by: Alanson Puls on: 08/14/2017 03:48 PM   Modules accepted: Orders

## 2017-08-14 NOTE — Therapy (Signed)
[place] MAIN [facility] SERVICES [address] [place], [place], [address] Phone: [phone]   Fax:  [phone]  Physical Therapy Treatment  Patient Details  Name: Adam Juarez MRN: [id number] Date of Birth: April 24, 1941 Referring Provider: Pleas Koch, NP   Encounter Date: 08/14/2017  PT End of Session - 08/14/17 1629    Visit Number  11    Number of Visits  17    Date for PT Re-Evaluation  10/09/17    PT Start Time  0400    PT Stop Time  0445    PT Time Calculation (min)  45 min    Equipment Utilized During Treatment  Gait belt    Activity Tolerance  Patient tolerated treatment well;Patient limited by pain    Behavior During Therapy  [facility] for tasks assessed/performed       Past Medical History:  Diagnosis Date  . Arthritis    fingers  . 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 Chicken pox   . Dysrhythmia 09/2013   Hx. a-fib x 1 episode patient states he "auto corrected" seen at [facility]  . GERD (gastroesophageal reflux disease)   . Headache    poor posture - none recently  . PSVT (paroxysmal supraventricular tachycardia) ([place]) 2009   Controlled with "breathing process"  . Renal stone 9/08, 6/15  . Wears dentures    full upper    Past Surgical History:  Procedure Laterality Date  . CARDIAC CATHETERIZATION  [address] [place]  . CATARACT EXTRACTION W/PHACO Left 07/27/2015   Procedure: CATARACT EXTRACTION PHACO AND INTRAOCULAR LENS PLACEMENT (IOC);  Surgeon: Leandrew Koyanagi, MD;  Location: [place];  Service: Ophthalmology;  Laterality: Left;  TORIC  . CATARACT EXTRACTION W/PHACO Right 08/24/2015   Procedure: CATARACT EXTRACTION PHACO AND INTRAOCULAR LENS PLACEMENT (IOC);  Surgeon: Leandrew Koyanagi, MD;  Location: [place];  Service: Ophthalmology;  Laterality: Right;  TORIC  . HEMORROIDECTOMY  2010   banding  . KIDNEY STONE SURGERY  9/08, 6/15   lithotripsy  . SKIN CANCER  EXCISION    . VASECTOMY      There were no vitals filed for this visit.  Subjective Assessment - 08/14/17 1628    Subjective  Patient continues to feel like he is getting better.     Pertinent History  Patient was seen by this therapist on 1/28 with referral for back pain; indicated at that time that his back pain was not a problem but he wanted to be seen for his shoulder. Returned today for shoulder evaluation. Patient reported 3 falls in last year; most recent due to "clumsiness" on Jan 4 during a trip to [place]; fell "hard" onto concrete patio. Could not sleep secondary to pain, went to hospital; received x-rays and medications; x-rays apparently negative but have not been received by PCP at this time. The patient's back and hip were also injured in the fall but the shoulder is his primary complaint presenting with pain with overhead motion; the pain has not improved since the fall. Patient denies prior shoulder problems, N/T. He is having difficulty holding things. Reports that he is scoliotic and his L shoulder has always been elevated relative to his R shoulder.    Limitations  House hold activities;Lifting    How long can you sit comfortably?  n/a    How long can you stand comfortably?  15 minutes    How long can you walk comfortably?  1 hr    Diagnostic tests  X-ray (not on file; pt reports negative xrays)    Patient Stated Goals  Patient wants to perform ADLs without pain; and be able to perform lifting, getting things off shelves without pain    Currently in Pain?  No/denies    Pain Score  0-No pain    Multiple Pain Sites  No       Therapeutic Exercise:  Supine shoulder serratus punch 2# 2 x 10; Supine shoulder circles, CW 2 x 10, CCW 2 x 10; Supine  shoulder rhythmic stabilization at elbow, 110 degrees scaption, 30 sec/bout x 2 bouts; Supine  shoulder D1 flexion and D2 extension RTB 2 x 10 each; Supine canes for flexion 2 x 10; Matrix PNF D!, D2 7. 5 lbs  Finger ladder x  20 Body blade x 30 sec x 30 deg, 45 deg, 60 deg  side lying shoulder ER, 2# weight 2 x 10; Side lying shoulder flex with 2 lbs x 20 x 2 Seated RTB rows 2 x 10  Cues for correct  posture and technique                       PT Education - 08/14/17 1629    Education provided  Yes    Education Details  HEP    Person(s) Educated  Patient    Methods  Explanation;Demonstration;Tactile cues    Comprehension  Verbalized understanding;Returned demonstration       PT Short Term Goals - 07/22/17 1110      PT SHORT TERM GOAL #1   Title  Patient will demonstrate independence with home exercise program to develop upper extremity functional mobility.    Baseline  patient is doing his HEP    Time  2    Period  Weeks    Status  Achieved      PT SHORT TERM GOAL #2   Title  Patient will improve active L shoulder flexion to 70 and abduction to 70 to demonstrate gains in functional upper extremity mobility.    Baseline  Active L shoulder flexion 90, abduction 72    Time  2    Period  Weeks    Status  Achieved        PT Long Term Goals - 07/22/17 1106      PT LONG TERM GOAL #1   Title  Patient will demonstrate normal L scapular position at rest for improved upper extremity mobility and strength.    Baseline  L scapula elevated at rest.    Time  8    Period  Weeks    Status  Partially Met    Target Date  08/14/17      PT LONG TERM GOAL #2   Title  Patient will demonstrate L grip strength of 85% of grip strength on R to demonstrate return to functional strength in left upper extremity.    Baseline  L hand 73% strength of R    Time  8    Period  Weeks    Status  Partially Met      PT LONG TERM GOAL #3   Title  Patient will reduce QuickDASH score to 15% to demonstrate reduced upper extremity disability.    Baseline  QuickDASH 38%    Time  8    Period  Weeks    Status  Partially Met      PT LONG TERM GOAL #4   Title  Patient will demonstrate active L shoulder  flexion to 120 and abduction to 130 to demonstrate full functional range in the upper extremity.    Baseline  Active L shoulder flexion 82, abduction 72    Time  8    Period  Weeks    Status  Partially Met    Target Date  08/14/17            Plan - 08/14/17 1632    Clinical Impression Statement  Patient performs AROM with improved ROM 155 left shoulder flex and 102 left shoulder abd. He is able to progress to increased resistance with matrix 12. 5 lbs. He is increasing his strength and ROM and will continue to benefit from skilled PT to improve mobility and strength and decrease pain.     Rehab Potential  Good    Clinical Impairments Affecting Rehab Potential  + motivated, good general health, family support, acute injury / - pain not improving, age    PT Frequency  2x / week    PT Duration  8 weeks    PT Treatment/Interventions  Aquatic Therapy;Cryotherapy;Electrical Stimulation;Moist Heat;Traction;Ultrasound;Gait training;Functional mobility training;Therapeutic activities;Therapeutic exercise;Patient/family education;Neuromuscular re-education;Balance training;Manual techniques;Dry needling;Passive range of motion;Energy conservation;Taping    PT Next Visit Plan  Shoulder ROM, strengthening    Consulted and Agree with Plan of Care  Patient       Patient will benefit from skilled therapeutic intervention in order to improve the following deficits and impairments:  Pain, Postural dysfunction, Hypomobility, Decreased strength, Decreased range of motion, Decreased activity tolerance, Difficulty walking, Impaired perceived functional ability, Impaired flexibility, Impaired UE functional use, Decreased mobility  Visit Diagnosis: Acute pain of left shoulder  Muscle weakness (generalized)     Problem List Patient Active Problem List   Diagnosis Date Noted  . Fall with injury 05/22/2017  . AAA (abdominal aortic aneurysm) ([place]) 05/22/2017  . Frequent headaches 01/15/2017  .  Osteoarthritis 07/13/2016  . Plantar fasciitis 07/13/2016  . Encounter for abdominal aortic aneurysm (AAA) screening 02/17/2016  . Hyperlipidemia 11/27/2015  . Chronic right-sided thoracic back pain 11/27/2015  . Chronic fatigue 11/27/2015  . SVT (supraventricular tachycardia) ([place]) 09/16/2015  . Paroxysmal atrial fibrillation ([place]) 09/16/2015  . 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 Esophageal reflux 09/16/2015  . Preventative health care 09/16/2015    Alanson Puls, [place] DPT 08/14/2017, 4:52 PM  [place] MAIN [facility] SERVICES [address] [place], [place], [address] Phone: [phone]   Fax:  [phone]  Name: Marshall Kampf MRN: [id number] Date of Birth: 1941-04-11

## 2017-08-16 ENCOUNTER — Encounter: Payer: Medicare Other | Attending: Primary Care | Admitting: Registered"

## 2017-08-16 ENCOUNTER — Encounter: Payer: Self-pay | Admitting: Registered"

## 2017-08-16 DIAGNOSIS — R7303 Prediabetes: Secondary | ICD-10-CM

## 2017-08-16 NOTE — Progress Notes (Signed)
Patient was seen on 08/16/17 for the Core Session 9 of Diabetes Prevention Program course at [facility]. By the end of this session patients are able to complete the following objectives:   Learning Objectives:  List and describe five steps to problem solving.   Apply the five problem solving steps to resolve a problem he or she has with eating less fat and fewer calories or being more active.   Goals:   Record weight taken outside of class.   Track foods and beverages eaten each day in the "Food and Activity Tracker," including calories and fat grams for each item.    Track activity type, minutes you were active, and distance you reached each day in the "Food and Activity Tracker."   Set aside one 20 to 30-minute block of time every day or find two or more periods of 10 to15 minutes each for physical activity.   Use problem solving action plan created during session to problem solve.   Follow-Up Plan:  Attend Core Session 10 next week.   Bring completed "Food and Activity Tracker" next week to be reviewed by Lifestyle Coach.  Bring menus from favorite restaurants to next session for future discussion.

## 2017-08-19 ENCOUNTER — Ambulatory Visit: Payer: Medicare Other | Admitting: Physical Therapy

## 2017-08-19 ENCOUNTER — Ambulatory Visit (INDEPENDENT_AMBULATORY_CARE_PROVIDER_SITE_OTHER): Payer: Medicare Other | Admitting: Primary Care

## 2017-08-19 ENCOUNTER — Encounter: Payer: Self-pay | Admitting: Primary Care

## 2017-08-19 VITALS — BP 110/60 | HR 67 | Temp 97.7°F | Ht 69.5 in | Wt 200.5 lb

## 2017-08-19 DIAGNOSIS — E538 Deficiency of other specified B group vitamins: Secondary | ICD-10-CM | POA: Diagnosis not present

## 2017-08-19 DIAGNOSIS — R5382 Chronic fatigue, unspecified: Secondary | ICD-10-CM

## 2017-08-19 DIAGNOSIS — D509 Iron deficiency anemia, unspecified: Secondary | ICD-10-CM

## 2017-08-19 DIAGNOSIS — Z125 Encounter for screening for malignant neoplasm of prostate: Secondary | ICD-10-CM | POA: Diagnosis not present

## 2017-08-19 LAB — CBC
HEMATOCRIT: 43.6 % (ref 39.0–52.0)
HEMOGLOBIN: 14.9 g/dL (ref 13.0–17.0)
MCHC: 34.3 g/dL (ref 30.0–36.0)
MCV: 96.3 fl (ref 78.0–100.0)
PLATELETS: 164 10*3/uL (ref 150.0–400.0)
RBC: 4.52 Mil/uL (ref 4.22–5.81)
RDW: 13.1 % (ref 11.5–15.5)
WBC: 4.5 10*3/uL (ref 4.0–10.5)

## 2017-08-19 LAB — BASIC METABOLIC PANEL
BUN: 20 mg/dL (ref 6–23)
CHLORIDE: 103 meq/L (ref 96–112)
CO2: 27 mEq/L (ref 19–32)
Calcium: 9.6 mg/dL (ref 8.4–10.5)
Creatinine, Ser: 0.86 mg/dL (ref 0.40–1.50)
GFR: 91.99 mL/min (ref >60.00)
GLUCOSE: 103 mg/dL — AB (ref 70–99)
POTASSIUM: 4.2 meq/L (ref 3.5–5.1)
Sodium: 140 mEq/L (ref 135–145)

## 2017-08-19 LAB — PSA, MEDICARE: PSA: 6.54 ng/ml — ABNORMAL HIGH (ref 0.10–4.00)

## 2017-08-19 LAB — IBC PANEL
Iron: 103 ug/dL (ref 42–165)
Saturation Ratios: 33 % (ref 20.0–50.0)
Transferrin: 223 mg/dL (ref 212.0–360.0)

## 2017-08-19 LAB — VITAMIN B12: Vitamin B-12: 359 pg/mL (ref 211–911)

## 2017-08-19 LAB — TSH: TSH: 1.71 u[IU]/mL (ref 0.35–4.50)

## 2017-08-19 NOTE — Assessment & Plan Note (Signed)
Ongoing for years, worse over last 3-4 months.  Exam today unremarkable. Check labs today including CBC, IBC panel, Testosterone, TSH, Vitamin B 12.  Epworth Sleepiness Scale of 12 today, consider sleep study if labs are unremarkable. He doesn't fit the physical picture of sleep apnea, especially given weight loss. Doesn't seem to be depression. Recommended regular exercise, avoid day time napping, work to stay busy.  Await lab results.

## 2017-08-19 NOTE — Patient Instructions (Addendum)
Stop by the lab prior to leaving today. I will notify you of your results once received.   Start exercising. You should be getting 150 minutes of moderate intensity exercise weekly.  Continue to work on weight loss through diet and exercise.   Avoid napping during the day if possible.   It was a pleasure to see you today!

## 2017-08-19 NOTE — Progress Notes (Signed)
Subjective:    Patient ID: Adam Juarez, male    DOB: 11/06/1940, 77 y.o.   MRN: [id number]  HPI  Adam Juarez is a 77 year old male with a history of paroxysmal atrial fibrillation, osteoarthritis, chronic back pain, chronic fatigue, prediabetes who presents today with a chief complaint of fatigue.  Chronic for years, feels that it has progressed since his fall in January 2019. He wakes up feeling tired, sluggish, low energy level. He takes a 1-2 hour nap after lunch most every day unless he's out and about. He spends most of his day doing things around the house, socializing at the club house, sitting and watching TV. 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 He is active in physical therapy several days weekly from fall in January 2019.   His wife has never mentioned snoring, waking during the night gasping for air. He wakes during the night around 4 am and will lay in bed for one hour before falling back asleep. He denies rectal bleeding, unexplained weight loss, depression, fevers, chest pain, shortness of breath.   Review of Systems  Constitutional: Positive for fatigue. Negative for unexpected weight change.  HENT: Negative for congestion.   Respiratory: Negative for shortness of breath.        Denies snoring  Cardiovascular: Negative for chest pain.  Gastrointestinal: Negative for blood in stool.  Neurological: Negative for weakness.       Intermittent vertigo  Psychiatric/Behavioral:       Denies depression       Past Medical History:  Diagnosis Date  . Arthritis    fingers  . 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 Chicken pox   . Dysrhythmia 09/2013   Hx. a-fib x 1 episode patient states he "auto corrected" seen at [facility]  . GERD (gastroesophageal reflux disease)   . Headache    poor posture - none recently  . PSVT (paroxysmal supraventricular tachycardia) ([place]) 2009   Controlled with "breathing process"  . Renal stone 9/08, 6/15  . Wears  dentures    full upper     Social History   Socioeconomic History  . Marital status: Married    Spouse name: Not on file  . Number of children: Not on file  . Years of education: Not on file  . Highest education level: Not on file  Occupational History  . Not on file  Social Needs  . Financial resource strain: Not on file  . Food insecurity:    Worry: Not on file    Inability: Not on file  . Transportation needs:    Medical: Not on file    Non-medical: Not on file  Tobacco Use  . Smoking status: Former Smoker    Packs/day: 0.50    Years: 38.00    Pack years: 19.00    Types: Cigarettes    Last attempt to quit: 07/13/1997    Years since quitting: 20.1  . Smokeless tobacco: Never Used  Substance and Sexual Activity  . Alcohol use: No  . Drug use: No  . Sexual activity: Not on file  Lifestyle  . Physical activity:    Days per week: Not on file    Minutes per session: Not on file  . Stress: Not on file  Relationships  . Social connections:    Talks on phone: Not on file    Gets together: Not on file    Attends religious service: Not on file  Active member of club or organization: Not on file    Attends meetings of clubs or organizations: Not on file    Relationship status: Not on file  . Intimate partner violence:    Fear of current or ex partner: Not on file    Emotionally abused: Not on file    Physically abused: Not on file    Forced sexual activity: Not on file  Other Topics Concern  . Not on file  Social History Narrative  . Not on file    Past Surgical History:  Procedure Laterality Date  . CARDIAC CATHETERIZATION  [address], [place]  . CATARACT EXTRACTION W/PHACO Left 07/27/2015   Procedure: CATARACT EXTRACTION PHACO AND INTRAOCULAR LENS PLACEMENT (IOC);  Surgeon: Leandrew Koyanagi, MD;  Location: [place];  Service: Ophthalmology;  Laterality: Left;  TORIC  . CATARACT EXTRACTION W/PHACO Right 08/24/2015   Procedure: CATARACT  EXTRACTION PHACO AND INTRAOCULAR LENS PLACEMENT (IOC);  Surgeon: Leandrew Koyanagi, MD;  Location: [place];  Service: Ophthalmology;  Laterality: Right;  TORIC  . HEMORROIDECTOMY  2010   banding  . KIDNEY STONE SURGERY  9/08, 6/15   lithotripsy  . SKIN CANCER EXCISION    . VASECTOMY      Family History  Problem Relation Age of Onset  . AAA (abdominal aortic aneurysm) Father   . Parkinson's disease Mother   . Arthritis Mother     Allergies  Allergen Reactions  . Other Anaphylaxis and Hives    BEE STINGS BEE STINGS BEE STINGS    Current Outpatient Medications on File Prior to Visit  Medication Sig Dispense Refill  . Ascorbic Acid (VITAMIN C PO) Take by mouth daily.    . Coenzyme Q10 (COQ10) 200 MG CAPS Take 200 mg by mouth daily.    . Cyanocobalamin (VITAMIN B 12 PO) Take 1 tablet by mouth daily.    . cyclobenzaprine (FLEXERIL) 5 MG tablet Take 5 mg by mouth 3 (three) times daily as needed for muscle spasms.    Marland Kitchen diltiazem (CARDIZEM) 30 MG tablet Take 1 tablet (30 mg total) by mouth 3 (three) times daily as needed. 90 tablet 3  . Magnesium 500 MG CAPS Take 500 mg by mouth daily.    . metoprolol succinate (TOPROL-XL) 25 MG 24 hr tablet TAKE 1 TABLET DAILY WITH OR IMMEDIATELY FOLLOWING A MEAL 90 tablet 3  . Misc Natural Products (GLUCOSAMINE CHONDROITIN ADV PO) Take 1 tablet by mouth daily.    . Multiple Vitamins-Minerals (PRESERVISION AREDS 2 PO) Take by mouth 2 (two) times daily.    . Nutritional Supplements (DHEA PO) Take by mouth.    Marland Kitchen omeprazole (PRILOSEC) 20 MG capsule Take 1 capsule (20 mg total) by mouth daily. 90 capsule 3  . polyvinyl alcohol (LIQUIFILM TEARS) 1.4 % ophthalmic solution Place 1 drop into both eyes as needed for dry eyes.    . Probiotic Product (PROBIOTIC DAILY PO) Take by mouth.    Marland Kitchen tiZANidine (ZANAFLEX) 4 MG tablet Take 1 tablet (4 mg total) by mouth at bedtime as needed for muscle spasms. 20 tablet 0  . triamcinolone cream (KENALOG) 0.1 %  Apply 1 application topically 2 (two) times daily. 30 g 0  . XARELTO 20 MG TABS tablet TAKE 1 TABLET DAILY WITH SUPPER 90 tablet 3   No current facility-administered medications on file prior to visit.     BP 110/60   Pulse 67   Temp 97.7 F (36.5 C) (Oral)  Ht 5' 9.5" (1.765 m)   Wt 200 lb 8 oz (90.9 kg)   SpO2 97%   BMI 29.18 kg/m    Objective:   Physical Exam  Constitutional: He is oriented to person, place, and time. He appears well-nourished.  Neck: Neck supple.  Cardiovascular: Normal rate and regular rhythm.  Pulmonary/Chest: Effort normal and breath sounds normal. He has no wheezes. He has no rales.  Musculoskeletal:  5/5 strength to bilateral lower extremities  Neurological: He is alert and oriented to person, place, and time. No cranial nerve deficit.  Skin: Skin is warm and dry.  Psychiatric: He has a normal mood and affect.          Assessment & Plan:

## 2017-08-20 ENCOUNTER — Other Ambulatory Visit: Payer: Self-pay | Admitting: Primary Care

## 2017-08-20 DIAGNOSIS — Z125 Encounter for screening for malignant neoplasm of prostate: Secondary | ICD-10-CM

## 2017-08-21 ENCOUNTER — Encounter: Payer: Self-pay | Admitting: Physical Therapy

## 2017-08-21 ENCOUNTER — Ambulatory Visit: Payer: Medicare Other | Admitting: Physical Therapy

## 2017-08-21 DIAGNOSIS — M25512 Pain in left shoulder: Secondary | ICD-10-CM | POA: Diagnosis not present

## 2017-08-21 DIAGNOSIS — M6281 Muscle weakness (generalized): Secondary | ICD-10-CM

## 2017-08-21 NOTE — Therapy (Signed)
[place] MAIN [facility] SERVICES [address] [place], [place], [address] Phone: [phone]   Fax:  [phone]  Physical Therapy Treatment  Patient Details  Name: Adam Juarez MRN: [id number] Date of Birth: 10-21-1940 Referring Provider: Pleas Koch, NP   Encounter Date: 08/21/2017  PT End of Session - 08/21/17 1523    Visit Number  13    Number of Visits  17    Date for PT Re-Evaluation  10/09/17    PT Start Time  1520    PT Stop Time  1600    PT Time Calculation (min)  40 min    Equipment Utilized During Treatment  Gait belt    Activity Tolerance  Patient tolerated treatment well;Patient limited by pain    Behavior During Therapy  [facility] for tasks assessed/performed       Past Medical History:  Diagnosis Date  . Arthritis    fingers  . 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 Chicken pox   . Dysrhythmia 09/2013   Hx. a-fib x 1 episode patient states he "auto corrected" seen at [facility]  . GERD (gastroesophageal reflux disease)   . Headache    poor posture - none recently  . PSVT (paroxysmal supraventricular tachycardia) ([place]) 2009   Controlled with "breathing process"  . Renal stone 9/08, 6/15  . Wears dentures    full upper    Past Surgical History:  Procedure Laterality Date  . CARDIAC CATHETERIZATION  [address] [place]  . CATARACT EXTRACTION W/PHACO Left 07/27/2015   Procedure: CATARACT EXTRACTION PHACO AND INTRAOCULAR LENS PLACEMENT (IOC);  Surgeon: Leandrew Koyanagi, MD;  Location: [place];  Service: Ophthalmology;  Laterality: Left;  TORIC  . CATARACT EXTRACTION W/PHACO Right 08/24/2015   Procedure: CATARACT EXTRACTION PHACO AND INTRAOCULAR LENS PLACEMENT (IOC);  Surgeon: Leandrew Koyanagi, MD;  Location: [place];  Service: Ophthalmology;  Laterality: Right;  TORIC  . HEMORROIDECTOMY  2010   banding  . KIDNEY STONE SURGERY  9/08, 6/15   lithotripsy  . SKIN CANCER  EXCISION    . VASECTOMY      There were no vitals filed for this visit.  Subjective Assessment - 08/21/17 1521    Subjective  Patient continues to feel like he is getting better.     Pertinent History  Patient was seen by this therapist on 1/28 with referral for back pain; indicated at that time that his back pain was not a problem but he wanted to be seen for his shoulder. Returned today for shoulder evaluation. Patient reported 3 falls in last year; most recent due to "clumsiness" on Jan 4 during a trip to [place]; fell "hard" onto concrete patio. Could not sleep secondary to pain, went to hospital; received x-rays and medications; x-rays apparently negative but have not been received by PCP at this time. The patient's back and hip were also injured in the fall but the shoulder is his primary complaint presenting with pain with overhead motion; the pain has not improved since the fall. Patient denies prior shoulder problems, N/T. He is having difficulty holding things. Reports that he is scoliotic and his L shoulder has always been elevated relative to his R shoulder.    Limitations  House hold activities;Lifting    How long can you sit comfortably?  n/a    How long can you stand comfortably?  15 minutes    How long can you walk comfortably?  1 hr    Diagnostic tests  X-ray (not on file; pt reports negative xrays)    Patient Stated Goals  Patient wants to perform ADLs without pain; and be able to perform lifting, getting things off shelves without pain        Treatment:    Armranger left including horizontal abd/add, and flex and ext x 20   UBE x 5 mins 2 1/2 fwd, 2 1/2 bwdeasiest setting  Supine LUE abd to 90 deg x 20 reps, no weight  Supine LUE ceiling punch x 20 reps  sidelying abd without weight x 20 reps  sidelying left shoulder flex without table for support x 20  Supine shoulder serratus punch 1#cane X 20  Supine canes for flexionBUE hips to ear 1 lb  weight on caneX 20 reps  Supine left shoulder flex with 1 lb  no cane x 20 reps  Matrix 2. 5 lbs standing left shoulder ER with pillow under arm to create a space x 10   Matrix scapula retraction with 12. 5 lbs x 20 BUE  Seated RTB rowsx 20, cues for correct technique  Patient needs cues for correct technique and good posture Left shoulder abd 100 deg, flex 160 degs                         PT Education - 08/21/17 1523    Education provided  Yes    Education Details  HEP    Person(s) Educated  Patient    Methods  Explanation;Demonstration;Tactile cues    Comprehension  Verbalized understanding;Returned demonstration       PT Short Term Goals - 07/22/17 1110      PT SHORT TERM GOAL #1   Title  Patient will demonstrate independence with home exercise program to develop upper extremity functional mobility.    Baseline  patient is doing his HEP    Time  2    Period  Weeks    Status  Achieved      PT SHORT TERM GOAL #2   Title  Patient will improve active L shoulder flexion to 70 and abduction to 70 to demonstrate gains in functional upper extremity mobility.    Baseline  Active L shoulder flexion 90, abduction 72    Time  2    Period  Weeks    Status  Achieved        PT Long Term Goals - 07/22/17 1106      PT LONG TERM GOAL #1   Title  Patient will demonstrate normal L scapular position at rest for improved upper extremity mobility and strength.    Baseline  L scapula elevated at rest.    Time  8    Period  Weeks    Status  Partially Met    Target Date  08/14/17      PT LONG TERM GOAL #2   Title  Patient will demonstrate L grip strength of 85% of grip strength on R to demonstrate return to functional strength in left upper extremity.    Baseline  L hand 73% strength of R    Time  8    Period  Weeks    Status  Partially Met      PT LONG TERM GOAL #3   Title  Patient will reduce QuickDASH score to 15% to demonstrate reduced upper  extremity disability.    Baseline  QuickDASH 38%    Time  8  Period  Weeks    Status  Partially Met      PT LONG TERM GOAL #4   Title  Patient will demonstrate active L shoulder flexion to 120 and abduction to 130 to demonstrate full functional range in the upper extremity.    Baseline  Active L shoulder flexion 82, abduction 72    Time  8    Period  Weeks    Status  Partially Met    Target Date  08/14/17            Plan - 08/21/17 1525    Clinical Impression Statement  Patient performs AROM and RROM to left shoulder with some pain during AROM 4/10. Patient demonstrates increased AROM  in supine and standing positions. Patient will continue to benefit from skilled PT to continue to improve ROM and decrease pain.     Rehab Potential  Good    Clinical Impairments Affecting Rehab Potential  + motivated, good general health, family support, acute injury / - pain not improving, age    PT Frequency  2x / week    PT Duration  8 weeks    PT Treatment/Interventions  Aquatic Therapy;Cryotherapy;Electrical Stimulation;Moist Heat;Traction;Ultrasound;Gait training;Functional mobility training;Therapeutic activities;Therapeutic exercise;Patient/family education;Neuromuscular re-education;Balance training;Manual techniques;Dry needling;Passive range of motion;Energy conservation;Taping    PT Next Visit Plan  Shoulder ROM, strengthening    Consulted and Agree with Plan of Care  Patient       Patient will benefit from skilled therapeutic intervention in order to improve the following deficits and impairments:  Pain, Postural dysfunction, Hypomobility, Decreased strength, Decreased range of motion, Decreased activity tolerance, Difficulty walking, Impaired perceived functional ability, Impaired flexibility, Impaired UE functional use, Decreased mobility  Visit Diagnosis: Acute pain of left shoulder  Muscle weakness (generalized)     Problem List Patient Active Problem List   Diagnosis  Date Noted  . Fall with injury 05/22/2017  . AAA (abdominal aortic aneurysm) ([place]) 05/22/2017  . Frequent headaches 01/15/2017  . Osteoarthritis 07/13/2016  . Plantar fasciitis 07/13/2016  . Encounter for abdominal aortic aneurysm (AAA) screening 02/17/2016  . Hyperlipidemia 11/27/2015  . Chronic right-sided thoracic back pain 11/27/2015  . Chronic fatigue 11/27/2015  . SVT (supraventricular tachycardia) ([place]) 09/16/2015  . Paroxysmal atrial fibrillation ([place]) 09/16/2015  . 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 Esophageal reflux 09/16/2015  . Preventative health care 09/16/2015    Alanson Puls , [place] DPT 08/21/2017, 3:41 PM  [place] MAIN [facility] SERVICES [address] [place], [place], [address] Phone: [phone]   Fax:  [phone]  Name: Adam Juarez MRN: [id number] Date of Birth: [birth year]

## 2017-08-22 LAB — TESTOS,TOTAL,FREE AND SHBG (FEMALE)
Free Testosterone: 51.8 pg/mL (ref 30.0–135.0)
Sex Hormone Binding: 54 nmol/L (ref 22–77)
Testosterone, Total, LC-MS-MS: 471 ng/dL (ref 250–1100)

## 2017-08-23 ENCOUNTER — Encounter: Payer: Self-pay | Admitting: Registered"

## 2017-08-23 ENCOUNTER — Encounter (HOSPITAL_BASED_OUTPATIENT_CLINIC_OR_DEPARTMENT_OTHER): Payer: Medicare Other | Admitting: Registered"

## 2017-08-23 DIAGNOSIS — R7303 Prediabetes: Secondary | ICD-10-CM | POA: Diagnosis not present

## 2017-08-23 NOTE — Progress Notes (Addendum)
Patient was seen on 08/23/17 for the Core Session 10 of Diabetes Prevention Program course at [facility]. By the end of this session patients are able to complete the following objectives:   Learning Objectives:  List and describe the four keys for healthy eating out.   Give examples of how to apply these keys at the type of restaurants that the participants go to regularly.   Make an appropriate meal selection from a restaurant menu.   Demonstrate how to ask for a substitute item using assertive language and a polite tone of voice.    Goals:   Record weight taken outside of class.   Track foods and beverages eaten each day in the "Food and Activity Tracker," including calories and fat grams for each item.    Track activity type, minutes you were active, and distance you reached each day in the "Food and Activity Tracker."   Set aside one 20 to 30-minute block of time every day or find two or more periods of 10 to15 minutes each for physical activity.   Utilize positive action plan and complete questions on "To Do List."   Follow-Up Plan:  Attend Core Session 11 next week.   Bring completed "Food and Activity Tracker" next week to be reviewed by [facility].

## 2017-08-26 ENCOUNTER — Encounter: Payer: Self-pay | Admitting: Physical Therapy

## 2017-08-26 ENCOUNTER — Ambulatory Visit: Payer: Medicare Other | Admitting: Physical Therapy

## 2017-08-26 DIAGNOSIS — M25512 Pain in left shoulder: Secondary | ICD-10-CM

## 2017-08-26 DIAGNOSIS — M6281 Muscle weakness (generalized): Secondary | ICD-10-CM

## 2017-08-27 NOTE — Therapy (Signed)
[place] MAIN [facility] SERVICES [address] [place], [place], [address] Phone: [phone]   Fax:  [phone]  Physical Therapy Treatment  Patient Details  Name: Adam Juarez MRN: [id number] Date of Birth: 11-14-1940 Referring Provider: Pleas Koch, NP   Encounter Date: 08/26/2017  PT End of Session - 08/26/17 1631    Visit Number  14    Number of Visits  17    Date for PT Re-Evaluation  10/09/17    PT Start Time  1600    PT Stop Time  1640    PT Time Calculation (min)  40 min    Equipment Utilized During Treatment  Gait belt    Activity Tolerance  Patient tolerated treatment well;Patient limited by pain    Behavior During Therapy  [facility] for tasks assessed/performed       Past Medical History:  Diagnosis Date  . Arthritis    fingers  . 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 Chicken pox   . Dysrhythmia 09/2013   Hx. a-fib x 1 episode patient states he "auto corrected" seen at [facility]  . GERD (gastroesophageal reflux disease)   . Headache    poor posture - none recently  . PSVT (paroxysmal supraventricular tachycardia) ([place]) 2009   Controlled with "breathing process"  . Renal stone 9/08, 6/15  . Wears dentures    full upper    Past Surgical History:  Procedure Laterality Date  . CARDIAC CATHETERIZATION  [address], [place]  . CATARACT EXTRACTION W/PHACO Left 07/27/2015   Procedure: CATARACT EXTRACTION PHACO AND INTRAOCULAR LENS PLACEMENT (IOC);  Surgeon: Leandrew Koyanagi, MD;  Location: [place];  Service: Ophthalmology;  Laterality: Left;  TORIC  . CATARACT EXTRACTION W/PHACO Right 08/24/2015   Procedure: CATARACT EXTRACTION PHACO AND INTRAOCULAR LENS PLACEMENT (IOC);  Surgeon: Leandrew Koyanagi, MD;  Location: [place];  Service: Ophthalmology;  Laterality: Right;  TORIC  . HEMORROIDECTOMY  2010   banding  . KIDNEY STONE SURGERY  9/08, 6/15   lithotripsy  . SKIN CANCER  EXCISION    . VASECTOMY      There were no vitals filed for this visit.  Subjective Assessment - 08/26/17 1627    Subjective  Patient continues to feel like he is getting better and he has no pain today.    Pertinent History  Patient was seen by this therapist on 1/28 with referral for back pain; indicated at that time that his back pain was not a problem but he wanted to be seen for his shoulder. Returned today for shoulder evaluation. Patient reported 3 falls in last year; most recent due to "clumsiness" on Jan 4 during a trip to [place]; fell "hard" onto concrete patio. Could not sleep secondary to pain, went to hospital; received x-rays and medications; x-rays apparently negative but have not been received by PCP at this time. The patient's back and hip were also injured in the fall but the shoulder is his primary complaint presenting with pain with overhead motion; the pain has not improved since the fall. Patient denies prior shoulder problems, N/T. He is having difficulty holding things. Reports that he is scoliotic and his L shoulder has always been elevated relative to his R shoulder.    Limitations  House hold activities;Lifting    How long can you sit comfortably?  n/a    How long can you stand comfortably?  15 minutes    How  long can you walk comfortably?  1 hr    Diagnostic tests  X-ray (not on file; pt reports negative xrays)    Patient Stated Goals  Patient wants to perform ADLs without pain; and be able to perform lifting, getting things off shelves without pain    Currently in Pain?  No/denies    Pain Score  0-No pain    Pain Onset  Other (comment) Chronic (years) since retiring from [employer], exacerbated with fall    Pain Onset  More than a month ago    Pain Onset  More than a month ago       Treatment: Prior to therapy: Left shoulder abd 92 deg, flex 96 degs   Armranger left including horizontal abd/add, and flex and ext x 20   UBE x 5 mins 2 1/2 fwd, 2 1/2 bwdeasiest  setting  Supine LUE abd to 90 deg x 20 reps, no weight  Supine LUE ceiling punch x 20 reps  sidelying abd without weight x 20 reps  sidelying left shoulder flex without table for support x 20  Supine shoulder serratus punch 1#cane X 20  Supine canes for flexionBUE hips to ear 1 lb weight on caneX 20 reps  Supine left shoulder flex with 1 lb  no cane x 20 reps  Matrix 2. 5 lbs standing left shoulder ER with pillow under arm to create a space x 10   Matrix scapula retraction with 7. 5 lbs x 20 BUE  Seated RTB rowsx 20, cues for correct technique  Patient needs cues for correct technique and good posture Left shoulder abd 105 deg, flex 160 degs                        PT Education - 08/26/17 1628    Education provided  Yes    Education Details  HEP    Person(s) Educated  Patient    Methods  Explanation;Demonstration;Tactile cues    Comprehension  Verbalized understanding;Returned demonstration       PT Short Term Goals - 07/22/17 1110      PT SHORT TERM GOAL #1   Title  Patient will demonstrate independence with home exercise program to develop upper extremity functional mobility.    Baseline  patient is doing his HEP    Time  2    Period  Weeks    Status  Achieved      PT SHORT TERM GOAL #2   Title  Patient will improve active L shoulder flexion to 70 and abduction to 70 to demonstrate gains in functional upper extremity mobility.    Baseline  Active L shoulder flexion 90, abduction 72    Time  2    Period  Weeks    Status  Achieved        PT Long Term Goals - 08/26/17 1632      PT LONG TERM GOAL #1   Title  Patient will demonstrate normal L scapular position at rest for improved upper extremity mobility and strength.    Baseline  L scapula elevated at rest.    Time  8    Period  Weeks    Status  Achieved    Target Date  08/26/17      PT LONG TERM GOAL #2   Title  Patient will demonstrate L grip strength of  85% of grip strength on R to demonstrate return to functional strength in left upper extremity.  Baseline  L hand 73% strength of R: L hand strength was 88% of right strength    Time  8    Period  Weeks    Status  Achieved      PT LONG TERM GOAL #3   Title  Patient will reduce QuickDASH score to 15% to demonstrate reduced upper extremity disability.    Baseline  QuickDASH 38%, 08/26/17 36%    Time  8    Period  Weeks    Status  Partially Met      PT LONG TERM GOAL #4   Title  Patient will demonstrate active L shoulder flexion to 120 and abduction to 130 to demonstrate full functional range in the upper extremity.    Baseline  Active L shoulder flexion 82, abduction 72; left shoulder abd 105,deg left flex 160 deg    Time  8    Period  Weeks    Status  Partially Met            Plan - 08/27/17 1151    Clinical Impression Statement  Patient demonstrates left shoulder weakness, and impaired strength and lack of  endrange ROM left shoulder  that is causing difficulty with  activites involving carrying and reaching over shoulder height. and home activities.  Patient still fatigues quickly to due very weak left shoulder flex and abduction and ER.   Patient showed poor standing exercise  technique and is able to generate good form after heavy cueing. Patient will continue to benefit from skilled physical therapy to improve left shoulder strength and left shoulder ROM  to improve quality of life.     Rehab Potential  Good    Clinical Impairments Affecting Rehab Potential  + motivated, good general health, family support, acute injury / - pain not improving, age    PT Frequency  2x / week    PT Duration  8 weeks    PT Treatment/Interventions  Aquatic Therapy;Cryotherapy;Electrical Stimulation;Moist Heat;Traction;Ultrasound;Gait training;Functional mobility training;Therapeutic activities;Therapeutic exercise;Patient/family education;Neuromuscular re-education;Balance training;Manual  techniques;Dry needling;Passive range of motion;Energy conservation;Taping    PT Next Visit Plan  Shoulder ROM, strengthening    Consulted and Agree with Plan of Care  Patient       Patient will benefit from skilled therapeutic intervention in order to improve the following deficits and impairments:  Pain, Postural dysfunction, Hypomobility, Decreased strength, Decreased range of motion, Decreased activity tolerance, Difficulty walking, Impaired perceived functional ability, Impaired flexibility, Impaired UE functional use, Decreased mobility  Visit Diagnosis: Acute pain of left shoulder  Muscle weakness (generalized)     Problem List Patient Active Problem List   Diagnosis Date Noted  . Fall with injury 05/22/2017  . AAA (abdominal aortic aneurysm) ([place]) 05/22/2017  . Frequent headaches 01/15/2017  . Osteoarthritis 07/13/2016  . Plantar fasciitis 07/13/2016  . Encounter for abdominal aortic aneurysm (AAA) screening 02/17/2016  . Hyperlipidemia 11/27/2015  . Chronic right-sided thoracic back pain 11/27/2015  . Chronic fatigue 11/27/2015  . SVT (supraventricular tachycardia) ([place]) 09/16/2015  . Paroxysmal atrial fibrillation ([place]) 09/16/2015  . 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 Esophageal reflux 09/16/2015  . Preventative health care 09/16/2015    Alanson Puls, [place] DPT 08/27/2017, 11:57 AM  [place] MAIN [facility] SERVICES [address] [place], [place], [address] Phone: [phone]   Fax:  [phone]  Name: Adam Juarez MRN: [id number] Date of Birth: [birth year]

## 2017-08-30 ENCOUNTER — Encounter: Payer: Medicare Other | Admitting: Registered"

## 2017-09-03 ENCOUNTER — Ambulatory Visit: Payer: Medicare Other | Admitting: Physical Therapy

## 2017-09-05 ENCOUNTER — Encounter: Payer: Self-pay | Admitting: Physical Therapy

## 2017-09-05 ENCOUNTER — Ambulatory Visit: Payer: Medicare Other | Admitting: Physical Therapy

## 2017-09-05 DIAGNOSIS — M25512 Pain in left shoulder: Secondary | ICD-10-CM | POA: Diagnosis not present

## 2017-09-05 DIAGNOSIS — M6281 Muscle weakness (generalized): Secondary | ICD-10-CM

## 2017-09-05 NOTE — Therapy (Signed)
[place] MAIN [facility] SERVICES [address] [place], [place], [address] Phone: [phone]   Fax:  [phone]  Physical Therapy Treatment  Patient Details  Name: Adam Juarez MRN: [id number] Date of Birth: 11/12/1940 Referring Provider: Pleas Koch, NP   Encounter Date: 09/05/2017  PT End of Session - 09/05/17 1533    Visit Number  15    Number of Visits  17    Date for PT Re-Evaluation  10/09/17    PT Start Time  5456    PT Stop Time  1553    PT Time Calculation (min)  38 min    Equipment Utilized During Treatment  Gait belt    Activity Tolerance  Patient tolerated treatment well;Patient limited by pain    Behavior During Therapy  [facility] for tasks assessed/performed       Past Medical History:  Diagnosis Date  . Arthritis    fingers  . 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 Chicken pox   . Dysrhythmia 09/2013   Hx. a-fib x 1 episode patient states he "auto corrected" seen at [facility]  . GERD (gastroesophageal reflux disease)   . Headache    poor posture - none recently  . PSVT (paroxysmal supraventricular tachycardia) ([place]) 2009   Controlled with "breathing process"  . Renal stone 9/08, 6/15  . Wears dentures    full upper    Past Surgical History:  Procedure Laterality Date  . CARDIAC CATHETERIZATION  [address], [place]  . CATARACT EXTRACTION W/PHACO Left 07/27/2015   Procedure: CATARACT EXTRACTION PHACO AND INTRAOCULAR LENS PLACEMENT (IOC);  Surgeon: Leandrew Koyanagi, MD;  Location: [place];  Service: Ophthalmology;  Laterality: Left;  TORIC  . CATARACT EXTRACTION W/PHACO Right 08/24/2015   Procedure: CATARACT EXTRACTION PHACO AND INTRAOCULAR LENS PLACEMENT (IOC);  Surgeon: Leandrew Koyanagi, MD;  Location: [place];  Service: Ophthalmology;  Laterality: Right;  TORIC  . HEMORROIDECTOMY  2010   banding  . KIDNEY STONE SURGERY  9/08, 6/15   lithotripsy  . SKIN CANCER  EXCISION    . VASECTOMY      There were no vitals filed for this visit.  Subjective Assessment - 09/05/17 1530    Subjective  Patient came home from a trip and needed to put luggage up in an overhead compartment and has ha a regression with decreased ROM and increased pain.     Pertinent History  Patient was seen by this therapist on 1/28 with referral for back pain; indicated at that time that his back pain was not a problem but he wanted to be seen for his shoulder. Returned today for shoulder evaluation. Patient reported 3 falls in last year; most recent due to "clumsiness" on Jan 4 during a trip to [place]; fell "hard" onto concrete patio. Could not sleep secondary to pain, went to hospital; received x-rays and medications; x-rays apparently negative but have not been received by PCP at this time. The patient's back and hip were also injured in the fall but the shoulder is his primary complaint presenting with pain with overhead motion; the pain has not improved since the fall. Patient denies prior shoulder problems, N/T. He is having difficulty holding things. Reports that he is scoliotic and his L shoulder has always been elevated relative to his R shoulder.    Limitations  House hold activities;Lifting    How long can you sit comfortably?  n/a  How long can you stand comfortably?  15 minutes    How long can you walk comfortably?  1 hr    Diagnostic tests  X-ray (not on file; pt reports negative xrays)    Patient Stated Goals  Patient wants to perform ADLs without pain; and be able to perform lifting, getting things off shelves without pain    Currently in Pain?  Yes    Pain Score  5     Pain Location  Shoulder    Pain Orientation  Left    Pain Descriptors / Indicators  Aching    Pain Type  Chronic pain    Pain Onset  Other (comment) Chronic (years) since retiring from [employer], exacerbated with fall    Pain Frequency  Constant    Aggravating Factors   movement above the shoulder    Pain  Relieving Factors  rest and medicine    Effect of Pain on Daily Activities  unable to perform some acitvities    Pain Onset  More than a month ago    Pain Onset  More than a month ago       Treatment: Prior to therapy: left shoulder 0-135 flex Left shoulder 0- 70 abd  Armranger left including horizontal abd/add, and flex and ext x 20   UBE x 5 mins 2 1/2 fwd, 2 1/2 bwdeasiest setting  Supine LUE abd to 90 deg x 20 reps, no weight  Supine LUE ceiling punch x 20 reps  sidelying abd without weight x 20 reps  sidelying left shoulder flex without table for support x 20  Supine shoulder serratus punch 0#cane X 20  Supine canes for flexionBUE hips to ear 1 lb weight on caneX 20 reps  Supine left shoulder flexwith 0 lbno cane x 20 reps  Matrix 2. 5 lbs standing left shoulder ER with pillow under arm to create a space x 10   Matrix scapula retraction with 7. 5 lbs x 20 BUE  Seated RTB rowsx 20, cues for correct technique  Patient needs cues for correct technique and good posture Left shoulder abd85 deg, flex160 degs                        PT Education - 09/05/17 1533    Education provided  Yes    Education Details  HEP    Person(s) Educated  Patient    Methods  Explanation;Demonstration    Comprehension  Verbalized understanding;Returned demonstration       PT Short Term Goals - 07/22/17 1110      PT SHORT TERM GOAL #1   Title  Patient will demonstrate independence with home exercise program to develop upper extremity functional mobility.    Baseline  patient is doing his HEP    Time  2    Period  Weeks    Status  Achieved      PT SHORT TERM GOAL #2   Title  Patient will improve active L shoulder flexion to 70 and abduction to 70 to demonstrate gains in functional upper extremity mobility.    Baseline  Active L shoulder flexion 90, abduction 72    Time  2    Period  Weeks    Status  Achieved        PT  Long Term Goals - 08/26/17 1632      PT LONG TERM GOAL #1   Title  Patient will demonstrate normal L scapular position at rest for  improved upper extremity mobility and strength.    Baseline  L scapula elevated at rest.    Time  8    Period  Weeks    Status  Achieved    Target Date  08/26/17      PT LONG TERM GOAL #2   Title  Patient will demonstrate L grip strength of 85% of grip strength on R to demonstrate return to functional strength in left upper extremity.    Baseline  L hand 73% strength of R: L hand strength was 88% of right strength    Time  8    Period  Weeks    Status  Achieved      PT LONG TERM GOAL #3   Title  Patient will reduce QuickDASH score to 15% to demonstrate reduced upper extremity disability.    Baseline  QuickDASH 38%, 08/26/17 36%    Time  8    Period  Weeks    Status  Partially Met      PT LONG TERM GOAL #4   Title  Patient will demonstrate active L shoulder flexion to 120 and abduction to 130 to demonstrate full functional range in the upper extremity.    Baseline  Active L shoulder flexion 82, abduction 72; left shoulder abd 105,deg left flex 160 deg    Time  8    Period  Weeks    Status  Partially Met            Plan - 09/05/17 1534    Clinical Impression Statement  Continuous verbal cues and tactile cues needed to correct form with exercises and for posture correction. Patient required min verbal cues to perform left shoulder exercises for posture and control and required verbal and tactile cues during all activities.  Patient is able to perform strengthening exercises with reports of pain. Patient will benefit from further skilled therapy to return to prior level of function.    Rehab Potential  Good    Clinical Impairments Affecting Rehab Potential  + motivated, good general health, family support, acute injury / - pain not improving, age    PT Frequency  2x / week    PT Duration  8 weeks    PT Treatment/Interventions  Aquatic  Therapy;Cryotherapy;Electrical Stimulation;Moist Heat;Traction;Ultrasound;Gait training;Functional mobility training;Therapeutic activities;Therapeutic exercise;Patient/family education;Neuromuscular re-education;Balance training;Manual techniques;Dry needling;Passive range of motion;Energy conservation;Taping    PT Next Visit Plan  Shoulder ROM, strengthening    Consulted and Agree with Plan of Care  Patient       Patient will benefit from skilled therapeutic intervention in order to improve the following deficits and impairments:  Pain, Postural dysfunction, Hypomobility, Decreased strength, Decreased range of motion, Decreased activity tolerance, Difficulty walking, Impaired perceived functional ability, Impaired flexibility, Impaired UE functional use, Decreased mobility  Visit Diagnosis: Acute pain of left shoulder  Muscle weakness (generalized)     Problem List Patient Active Problem List   Diagnosis Date Noted  . Fall with injury 05/22/2017  . AAA (abdominal aortic aneurysm) ([place]) 05/22/2017  . Frequent headaches 01/15/2017  . Osteoarthritis 07/13/2016  . Plantar fasciitis 07/13/2016  . Encounter for abdominal aortic aneurysm (AAA) screening 02/17/2016  . Hyperlipidemia 11/27/2015  . Chronic right-sided thoracic back pain 11/27/2015  . Chronic fatigue 11/27/2015  . SVT (supraventricular tachycardia) ([place]) 09/16/2015  . Paroxysmal atrial fibrillation ([place]) 09/16/2015  . 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 Esophageal reflux 09/16/2015  . Preventative health care 09/16/2015    Alanson Puls, PT DPT 09/05/2017, 4:02  PM  [place] MAIN [facility] SERVICES [address] [place], [place], [address] Phone: [phone]   Fax:  [phone]  Name: Adam Juarez MRN: [id number] Date of Birth: [birth year]

## 2017-09-06 ENCOUNTER — Encounter: Payer: Self-pay | Admitting: Registered"

## 2017-09-06 ENCOUNTER — Encounter (HOSPITAL_BASED_OUTPATIENT_CLINIC_OR_DEPARTMENT_OTHER): Payer: Medicare Other | Admitting: Registered"

## 2017-09-06 DIAGNOSIS — R7303 Prediabetes: Secondary | ICD-10-CM

## 2017-09-06 NOTE — Progress Notes (Signed)
Patient was seen on 09/06/17 for the Core Session 11 of Diabetes Prevention Program course at [facility]. By the end of this session patients are able to complete the following objectives:   Learning Objectives:  Give examples of negative thoughts that could prevent them from meeting their goals of losing weight and being more physically active.   Describe how to stop negative thoughts and talk back to them with positive thoughts.   Practice 1) stopping negative thoughts and 2) talking back to negative thoughts with positive ones.    Goals:   Record weight taken outside of class.   Track foods and beverages eaten each day in the "Food and Activity Tracker," including calories and fat grams for each item.    Track activity type, minutes you were active, and distance you reached each day in the "Food and Activity Tracker."   If you have any negative thoughts-write them in your Food and Activity Trackers, along with how you talked back to them. Practice stopping negative thoughts and talking back to them with positive thoughts.   Follow-Up Plan:  Attend Core Session 12 next week.   Bring completed "Food and Activity Tracker" next week to be reviewed by [facility].

## 2017-09-10 ENCOUNTER — Ambulatory Visit: Payer: Medicare Other | Admitting: Physical Therapy

## 2017-09-10 ENCOUNTER — Encounter: Payer: Self-pay | Admitting: Physical Therapy

## 2017-09-10 DIAGNOSIS — M25512 Pain in left shoulder: Secondary | ICD-10-CM | POA: Diagnosis not present

## 2017-09-10 DIAGNOSIS — M6281 Muscle weakness (generalized): Secondary | ICD-10-CM | POA: Diagnosis not present

## 2017-09-10 NOTE — Therapy (Signed)
[place] MAIN [facility] SERVICES [address] [place], [place], [address] Phone: [phone]   Fax:  [phone]  Physical Therapy Treatment  Patient Details  Name: Adam Juarez MRN: [id number] Date of Birth: 05/04/1941 Referring Provider: Pleas Koch, NP   Encounter Date: 09/10/2017  PT End of Session - 09/10/17 1610    Visit Number  16    Number of Visits  17    Date for PT Re-Evaluation  10/09/17    PT Start Time  1600    PT Stop Time  1645    PT Time Calculation (min)  45 min    Equipment Utilized During Treatment  Gait belt    Activity Tolerance  Patient tolerated treatment well;Patient limited by pain    Behavior During Therapy  [facility] for tasks assessed/performed       Past Medical History:  Diagnosis Date  . Arthritis    fingers  . 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 Chicken pox   . Dysrhythmia 09/2013   Hx. a-fib x 1 episode patient states he "auto corrected" seen at [facility]  . GERD (gastroesophageal reflux disease)   . Headache    poor posture - none recently  . PSVT (paroxysmal supraventricular tachycardia) ([place]) 2009   Controlled with "breathing process"  . Renal stone 9/08, 6/15  . Wears dentures    full upper    Past Surgical History:  Procedure Laterality Date  . CARDIAC CATHETERIZATION  [address], [place]  . CATARACT EXTRACTION W/PHACO Left 07/27/2015   Procedure: CATARACT EXTRACTION PHACO AND INTRAOCULAR LENS PLACEMENT (IOC);  Surgeon: Leandrew Koyanagi, MD;  Location: [place];  Service: Ophthalmology;  Laterality: Left;  TORIC  . CATARACT EXTRACTION W/PHACO Right 08/24/2015   Procedure: CATARACT EXTRACTION PHACO AND INTRAOCULAR LENS PLACEMENT (IOC);  Surgeon: Leandrew Koyanagi, MD;  Location: [place];  Service: Ophthalmology;  Laterality: Right;  TORIC  . HEMORROIDECTOMY  2010   banding  . KIDNEY STONE SURGERY  9/08, 6/15   lithotripsy  . SKIN CANCER  EXCISION    . VASECTOMY      There were no vitals filed for this visit.  Subjective Assessment - 09/10/17 1608    Subjective  Patient came home from a trip and needed to put luggage up in an overhead compartment and has ha a regression with decreased ROM and increased pain.     Pertinent History  Patient was seen by this therapist on 1/28 with referral for back pain; indicated at that time that his back pain was not a problem but he wanted to be seen for his shoulder. Returned today for shoulder evaluation. Patient reported 3 falls in last year; most recent due to "clumsiness" on Jan 4 during a trip to [place]; fell "hard" onto concrete patio. Could not sleep secondary to pain, went to hospital; received x-rays and medications; x-rays apparently negative but have not been received by PCP at this time. The patient's back and hip were also injured in the fall but the shoulder is his primary complaint presenting with pain with overhead motion; the pain has not improved since the fall. Patient denies prior shoulder problems, N/T. He is having difficulty holding things. Reports that he is scoliotic and his L shoulder has always been elevated relative to his R shoulder.    Limitations  House hold activities;Lifting    How long can you sit comfortably?  n/a  How long can you stand comfortably?  15 minutes    How long can you walk comfortably?  1 hr    Diagnostic tests  X-ray (not on file; pt reports negative xrays)    Patient Stated Goals  Patient wants to perform ADLs without pain; and be able to perform lifting, getting things off shelves without pain    Currently in Pain?  Yes    Pain Score  5     Pain Location  Shoulder    Pain Orientation  Left    Pain Descriptors / Indicators  Aching    Pain Type  Chronic pain    Pain Onset  Other (comment) Chronic (years) since retiring from [employer], exacerbated with fall    Pain Frequency  Constant    Aggravating Factors   raising it up     Multiple Pain  Sites  No    Pain Onset  More than a month ago    Pain Onset  More than a month ago        Treatment: Prior to therapy: left shoulder 0-135 flex Left shoulder 0- 70 abd  Armranger left including horizontal abd/add, and flex and ext x 20   UBE x 5 mins 2 1/2 fwd, 2 1/2 bwdeasiest setting  Supine LUE abd to 90 deg x 20 reps, no weight  Supine LUE ceiling punch  2 lbs x 20 reps  sidelying abd with 1 lb  weight x 20 reps  sidelying left shoulder flex with table for support x 20  Supine shoulder serratus punch 2 #cane X 20  Supine canes for flexionBUE hips to ear 2 lb weight on caneX 20 reps  Supine left shoulder flexwith 2 lbno cane x 20 reps  Matrix 2. 5 lbs standing left shoulder ER with pillow under arm to create a space x 10   Matrix scapula retraction with 7. 5 lbs x 20 BUE  Seated RTB rowsx 20, 12. 5 lbs  cues for correct technique  Patient needs cues for correct technique and good posture Left shoulder abd  92 deg, flex160 degs                         PT Education - 09/10/17 1609    Education provided  Yes    Education Details  use of ice and heat    Person(s) Educated  Patient    Methods  Explanation    Comprehension  Verbalized understanding;Returned demonstration       PT Short Term Goals - 07/22/17 1110      PT SHORT TERM GOAL #1   Title  Patient will demonstrate independence with home exercise program to develop upper extremity functional mobility.    Baseline  patient is doing his HEP    Time  2    Period  Weeks    Status  Achieved      PT SHORT TERM GOAL #2   Title  Patient will improve active L shoulder flexion to 70 and abduction to 70 to demonstrate gains in functional upper extremity mobility.    Baseline  Active L shoulder flexion 90, abduction 72    Time  2    Period  Weeks    Status  Achieved        PT Long Term Goals - 08/26/17 1632      PT LONG TERM GOAL #1   Title   Patient will demonstrate normal L scapular position at  rest for improved upper extremity mobility and strength.    Baseline  L scapula elevated at rest.    Time  8    Period  Weeks    Status  Achieved    Target Date  08/26/17      PT LONG TERM GOAL #2   Title  Patient will demonstrate L grip strength of 85% of grip strength on R to demonstrate return to functional strength in left upper extremity.    Baseline  L hand 73% strength of R: L hand strength was 88% of right strength    Time  8    Period  Weeks    Status  Achieved      PT LONG TERM GOAL #3   Title  Patient will reduce QuickDASH score to 15% to demonstrate reduced upper extremity disability.    Baseline  QuickDASH 38%, 08/26/17 36%    Time  8    Period  Weeks    Status  Partially Met      PT LONG TERM GOAL #4   Title  Patient will demonstrate active L shoulder flexion to 120 and abduction to 130 to demonstrate full functional range in the upper extremity.    Baseline  Active L shoulder flexion 82, abduction 72; left shoulder abd 105,deg left flex 160 deg    Time  8    Period  Weeks    Status  Partially Met            Plan - 09/10/17 1611    Clinical Impression Statement  Patient demonstrates left shoulder weakness, and impaired strength and lack of endrange ROM left shoulder that is causing difficulty with activites involving carrying and reaching over shoulder height. and home activities. Patient still fatigues quickly to due very weak left shoulder flex and abduction and ER. Patient showed poor standing exercise technique and is able to generate good form after heavy cueing. Patient will continue to benefit from skilled physical therapy to improve left shoulder strength and left shoulder ROM to improve quality of life.     Rehab Potential  Good    Clinical Impairments Affecting Rehab Potential  + motivated, good general health, family support, acute injury / - pain not improving, age    PT Frequency  2x / week    PT  Duration  8 weeks    PT Treatment/Interventions  Aquatic Therapy;Cryotherapy;Electrical Stimulation;Moist Heat;Traction;Ultrasound;Gait training;Functional mobility training;Therapeutic activities;Therapeutic exercise;Patient/family education;Neuromuscular re-education;Balance training;Manual techniques;Dry needling;Passive range of motion;Energy conservation;Taping    PT Next Visit Plan  Shoulder ROM, strengthening    Consulted and Agree with Plan of Care  Patient       Patient will benefit from skilled therapeutic intervention in order to improve the following deficits and impairments:  Pain, Postural dysfunction, Hypomobility, Decreased strength, Decreased range of motion, Decreased activity tolerance, Difficulty walking, Impaired perceived functional ability, Impaired flexibility, Impaired UE functional use, Decreased mobility  Visit Diagnosis: Acute pain of left shoulder  Muscle weakness (generalized)     Problem List Patient Active Problem List   Diagnosis Date Noted  . Fall with injury 05/22/2017  . AAA (abdominal aortic aneurysm) ([place]) 05/22/2017  . Frequent headaches 01/15/2017  . Osteoarthritis 07/13/2016  . Plantar fasciitis 07/13/2016  . Encounter for abdominal aortic aneurysm (AAA) screening 02/17/2016  . Hyperlipidemia 11/27/2015  . Chronic right-sided thoracic back pain 11/27/2015  . Chronic fatigue 11/27/2015  . SVT (supraventricular tachycardia) ([place]) 09/16/2015  . Paroxysmal atrial fibrillation ([place]) 09/16/2015  . Frequent  PVCs 09/16/2015  . Esophageal reflux 09/16/2015  . Preventative health care 09/16/2015    Alanson Puls, PT DPT 09/10/2017, 4:13 PM  [place] MAIN [facility] SERVICES [address] [place], [place], [address] Phone: [phone]   Fax:  [phone]  Name: Adam Juarez MRN: [id number] Date of Birth: [birth year]

## 2017-09-12 ENCOUNTER — Encounter: Payer: Self-pay | Admitting: Physical Therapy

## 2017-09-12 ENCOUNTER — Ambulatory Visit: Payer: Medicare Other | Attending: Primary Care | Admitting: Physical Therapy

## 2017-09-12 DIAGNOSIS — M25512 Pain in left shoulder: Secondary | ICD-10-CM

## 2017-09-12 DIAGNOSIS — M6281 Muscle weakness (generalized): Secondary | ICD-10-CM | POA: Diagnosis not present

## 2017-09-12 DIAGNOSIS — M546 Pain in thoracic spine: Secondary | ICD-10-CM | POA: Insufficient documentation

## 2017-09-12 DIAGNOSIS — M545 Low back pain: Secondary | ICD-10-CM | POA: Diagnosis not present

## 2017-09-12 NOTE — Therapy (Signed)
[place] [name] [address] [place], [place], [address] Phone: [phone]   Fax:  [phone]  Physical Therapy Treatment  Patient Details  Name: Adam Juarez MRN: [id number] Date of Birth: 09-18-1940 Referring Provider: Pleas Koch, NP   Encounter Date: 09/12/2017  PT End of Session - 09/12/17 1308    Visit Number  17    Number of Visits  33    Date for PT Re-Evaluation  10/09/17    Equipment Utilized During Treatment  Gait belt    Activity Tolerance  Patient tolerated treatment well;Patient limited by pain    Behavior During Therapy  [facility] for tasks assessed/performed       Past Medical History:  Diagnosis Date  . Arthritis    fingers  . 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 Chicken pox   . Dysrhythmia 09/2013   Hx. a-fib x 1 episode patient states he "auto corrected" seen at [facility]  . GERD (gastroesophageal reflux disease)   . Headache    poor posture - none recently  . PSVT (paroxysmal supraventricular tachycardia) ([facility]) 2009   Controlled with "breathing process"  . Renal stone 9/08, 6/15  . Wears dentures    full upper    Past Surgical History:  Procedure Laterality Date  . CARDIAC CATHETERIZATION  [address] [place]  . CATARACT EXTRACTION W/PHACO Left 07/27/2015   Procedure: CATARACT EXTRACTION PHACO AND INTRAOCULAR LENS PLACEMENT (IOC);  Surgeon: Leandrew Koyanagi, MD;  Location: [facility];  Service: Ophthalmology;  Laterality: Left;  TORIC  . CATARACT EXTRACTION W/PHACO Right 08/24/2015   Procedure: CATARACT EXTRACTION PHACO AND INTRAOCULAR LENS PLACEMENT (IOC);  Surgeon: Leandrew Koyanagi, MD;  Location: [facility];  Service: Ophthalmology;  Laterality: Right;  TORIC  . HEMORROIDECTOMY  2010   banding  . KIDNEY STONE SURGERY  9/08, 6/15   lithotripsy  . SKIN CANCER EXCISION    . VASECTOMY      There were no vitals filed for this  visit.  Subjective Assessment - 09/12/17 1307    Subjective  Patient is having no pain today but he has not had to use his arm very much.     Pertinent History  Patient was seen by this therapist on 1/28 with referral for back pain; indicated at that time that his back pain was not a problem but he wanted to be seen for his shoulder. Returned today for shoulder evaluation. Patient reported 3 falls in last year; most recent due to "clumsiness" on Jan 4 during a trip to [place]; fell "hard" onto concrete patio. Could not sleep secondary to pain, went to hospital; received x-rays and medications; x-rays apparently negative but have not been received by PCP at this time. The patient's back and hip were also injured in the fall but the shoulder is his primary complaint presenting with pain with overhead motion; the pain has not improved since the fall. Patient denies prior shoulder problems, N/T. He is having difficulty holding things. Reports that he is scoliotic and his L shoulder has always been elevated relative to his R shoulder.    Limitations  House hold activities;Lifting    How long can you sit comfortably?  n/a    How long can you stand comfortably?  15 minutes    How long can you walk comfortably?  1 hr    Diagnostic tests  X-ray (not on file; pt reports negative xrays)  Patient Stated Goals  Patient wants to perform ADLs without pain; and be able to perform lifting, getting things off shelves without pain    Currently in Pain?  No/denies    Pain Score  0-No pain    Pain Onset  Other (comment) Chronic (years) since retiring from [employer], exacerbated with fall    Multiple Pain Sites  No    Pain Onset  More than a month ago    Pain Onset  More than a month ago         Treatment LUE exercises: Armranger left including horizontal abd/add, and flex and ext x 20  UBE x 5 mins 2 1/2 fwd, 2 1/2 bwdeasiest setting Supine  shoulder serratus punch 3# 2 x 10; Standing shoulder D1 flexion and  D2 extension RTB 2 x 10 each; Supine canes for flexion 2 x 10; MATRIX PNF  D1,D2 7. 5 lbs  GTB shoulder extension x 10 x 2  L sidelying R shoulder ER, 2  weight 2 x 10; Seated RTB rows 2 x 10 Supine horizontal abd, horizontal add x 20   Left shoulder flex 160 AROM Left shoulder abd 160 AROM   Patient needs cues for correct form and posture during exercises and was able to get to full ROM today.                      PT Education - 09/12/17 1307    Education provided  Yes    Education Details  HEP    Person(s) Educated  Patient    Methods  Explanation;Demonstration    Comprehension  Verbalized understanding;Returned demonstration       PT Short Term Goals - 07/22/17 1110      PT SHORT TERM GOAL #1   Title  Patient will demonstrate independence with home exercise program to develop upper extremity functional mobility.    Baseline  patient is doing his HEP    Time  2    Period  Weeks    Status  Achieved      PT SHORT TERM GOAL #2   Title  Patient will improve active L shoulder flexion to 70 and abduction to 70 to demonstrate gains in functional upper extremity mobility.    Baseline  Active L shoulder flexion 90, abduction 72    Time  2    Period  Weeks    Status  Achieved        PT Long Term Goals - 08/26/17 1632      PT LONG TERM GOAL #1   Title  Patient will demonstrate normal L scapular position at rest for improved upper extremity mobility and strength.    Baseline  L scapula elevated at rest.    Time  8    Period  Weeks    Status  Achieved    Target Date  08/26/17      PT LONG TERM GOAL #2   Title  Patient will demonstrate L grip strength of 85% of grip strength on R to demonstrate return to functional strength in left upper extremity.    Baseline  L hand 73% strength of R: L hand strength was 88% of right strength    Time  8    Period  Weeks    Status  Achieved      PT LONG TERM GOAL #3   Title  Patient will reduce QuickDASH score to  15% to demonstrate reduced upper extremity disability.  Baseline  QuickDASH 38%, 08/26/17 36%    Time  8    Period  Weeks    Status  Partially Met      PT LONG TERM GOAL #4   Title  Patient will demonstrate active L shoulder flexion to 120 and abduction to 130 to demonstrate full functional range in the upper extremity.    Baseline  Active L shoulder flexion 82, abduction 72; left shoulder abd 105,deg left flex 160 deg    Time  8    Period  Weeks    Status  Partially Met            Plan - 09/12/17 1255    Clinical Impression Statement    Patient described good fatigue in middle trap and rhomboids with exercises.Good increase in ROM and decreased pain today with improved ROM.  Patient will continue to benefit from skilled physical therapy to increase ROM of R shoulder to improve functional mobility to reduce difficulty with overhead movements.    Rehab Potential  Good    Clinical Impairments Affecting Rehab Potential  + motivated, good general health, family support, acute injury / - pain not improving, age    PT Frequency  2x / week    PT Duration  8 weeks    PT Treatment/Interventions  Aquatic Therapy;Cryotherapy;Electrical Stimulation;Moist Heat;Traction;Ultrasound;Gait training;Functional mobility training;Therapeutic activities;Therapeutic exercise;Patient/family education;Neuromuscular re-education;Balance training;Manual techniques;Dry needling;Passive range of motion;Energy conservation;Taping    PT Next Visit Plan  Shoulder ROM, strengthening    Consulted and Agree with Plan of Care  Patient       Patient will benefit from skilled therapeutic intervention in order to improve the following deficits and impairments:  Pain, Postural dysfunction, Hypomobility, Decreased strength, Decreased range of motion, Decreased activity tolerance, Difficulty walking, Impaired perceived functional ability, Impaired flexibility, Impaired UE functional use, Decreased mobility  Visit  Diagnosis: Acute pain of left shoulder  Muscle weakness (generalized)     Problem List Patient Active Problem List   Diagnosis Date Noted  . Fall with injury 05/22/2017  . AAA (abdominal aortic aneurysm) ([place]) 05/22/2017  . Frequent headaches 01/15/2017  . Osteoarthritis 07/13/2016  . Plantar fasciitis 07/13/2016  . Encounter for abdominal aortic aneurysm (AAA) screening 02/17/2016  . Hyperlipidemia 11/27/2015  . Chronic right-sided thoracic back pain 11/27/2015  . Chronic fatigue 11/27/2015  . SVT (supraventricular tachycardia) ([place]) 09/16/2015  . Paroxysmal atrial fibrillation ([place]) 09/16/2015  . 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 Esophageal reflux 09/16/2015  . Preventative health care 09/16/2015    Alanson Puls, PT DPT 09/12/2017, 1:44 PM  [place] [facility] SERVICES [address] [place], [place], [address] Phone: [phone]   Fax:  [phone]  Name: Adam Juarez MRN: [id number] Date of Birth: Nov 10, 1940

## 2017-09-13 ENCOUNTER — Encounter: Payer: Medicare Other | Attending: Primary Care | Admitting: Registered"

## 2017-09-13 ENCOUNTER — Encounter: Payer: Self-pay | Admitting: Registered"

## 2017-09-13 DIAGNOSIS — R7303 Prediabetes: Secondary | ICD-10-CM

## 2017-09-13 NOTE — Progress Notes (Signed)
Patient was seen on 09/13/17 for the Core Session 12 of Diabetes Prevention Program course at [facility]. By the end of this session patients are able to complete the following objectives:   Learning Objectives:  Describe their current progress toward defined goals.  Describe common causes for slipping from healthy eating or being  active.  Explain what to do to get back on their feet after a slip.  Goals:   Record weight taken outside of class.   Track foods and beverages eaten each day in the "Food and Activity Tracker," including calories and fat grams for each item.    Track activity type, minutes active, and distance reached each day in the "Food and Activity Tracker."   Try out the two action plans created during session- "Slips from Healthy Eating: Action Plan" and "Slips from Being Active: Action Plan"  Answer questions on the handout.   Follow-Up Plan:  Attend Core Session 13 next week.   Bring completed "Food and Activity Tracker" next week to be reviewed by Lifestyle Coach.

## 2017-09-17 ENCOUNTER — Encounter: Payer: Self-pay | Admitting: Physical Therapy

## 2017-09-17 ENCOUNTER — Ambulatory Visit: Payer: Medicare Other | Admitting: Physical Therapy

## 2017-09-17 DIAGNOSIS — M545 Low back pain: Secondary | ICD-10-CM | POA: Diagnosis not present

## 2017-09-17 DIAGNOSIS — M546 Pain in thoracic spine: Secondary | ICD-10-CM | POA: Diagnosis not present

## 2017-09-17 DIAGNOSIS — M25512 Pain in left shoulder: Secondary | ICD-10-CM | POA: Diagnosis not present

## 2017-09-17 DIAGNOSIS — M6281 Muscle weakness (generalized): Secondary | ICD-10-CM

## 2017-09-17 NOTE — Therapy (Signed)
[facility] [address] [place], [place], [address] Phone: [phone]   Fax:  [phone]  Physical Therapy Treatment  Patient Details  Name: Adam Juarez MRN: [id number] Date of Birth: 05/30/1940 Referring Provider: Clark, Katherine K, NP   Encounter Date: 09/17/2017  PT End of Session - 09/17/17 1458    Visit Number  18    Number of Visits  33    Date for PT Re-Evaluation  10/09/17    PT Start Time  0250    PT Stop Time  0330    PT Time Calculation (min)  40 min    Equipment Utilized During Treatment  Gait belt    Activity Tolerance  Patient tolerated treatment well;Patient limited by pain    Behavior During Therapy  WFL for tasks assessed/performed       Past Medical History:  Diagnosis Date  . Arthritis    fingers  . 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 Chicken pox   . Dysrhythmia 09/2013   Hx. a-fib x 1 episode patient states he "auto corrected" seen at [facility]  . GERD (gastroesophageal reflux disease)   . Headache    poor posture - none recently  . PSVT (paroxysmal supraventricular tachycardia) ([facility]) 2009   Controlled with "breathing process"  . Renal stone 9/08, 6/15  . Wears dentures    full upper    Past Surgical History:  Procedure Laterality Date  . CARDIAC CATHETERIZATION  2008   [place]  . CATARACT EXTRACTION W/PHACO Left 07/27/2015   Procedure: CATARACT EXTRACTION PHACO AND INTRAOCULAR LENS PLACEMENT (IOC);  Surgeon: Chadwick Brasington, MD;  Location: [facility];  Service: Ophthalmology;  Laterality: Left;  TORIC  . CATARACT EXTRACTION W/PHACO Right 08/24/2015   Procedure: CATARACT EXTRACTION PHACO AND INTRAOCULAR LENS PLACEMENT (IOC);  Surgeon: Chadwick Brasington, MD;  Location: [facility];  Service: Ophthalmology;  Laterality: Right;  TORIC  . HEMORROIDECTOMY  2010   banding  . KIDNEY STONE SURGERY  9/08, 6/15   lithotripsy  . SKIN CANCER  EXCISION    . VASECTOMY      There were no vitals filed for this visit.  Subjective Assessment - 09/17/17 1456    Subjective  Patient is having very little  pain today and is doing so much better.     Pertinent History  Patient was seen by this therapist on 1/28 with referral for back pain; indicated at that time that his back pain was not a problem but he wanted to be seen for his shoulder. Returned today for shoulder evaluation. Patient reported 3 falls in last year; most recent due to "clumsiness" on Jan 4 during a trip to [place]; fell "hard" onto concrete patio. Could not sleep secondary to pain, went to hospital; received x-rays and medications; x-rays apparently negative but have not been received by PCP at this time. The patient's back and hip were also injured in the fall but the shoulder is his primary complaint presenting with pain with overhead motion; the pain has not improved since the fall. Patient denies prior shoulder problems, N/T. He is having difficulty holding things. Reports that he is scoliotic and his L shoulder has always been elevated relative to his R shoulder.    Limitations  House hold activities;Lifting    How long can you sit comfortably?  n/a    How long can you stand comfortably?  15 minutes    How   long can you walk comfortably?  1 hr    Diagnostic tests  X-ray (not on file; pt reports negative xrays)    Patient Stated Goals  Patient wants to perform ADLs without pain; and be able to perform lifting, getting things off shelves without pain    Currently in Pain?  Yes    Pain Score  1     Pain Orientation  Left    Pain Descriptors / Indicators  Aching    Pain Type  Acute pain    Pain Onset  Other (comment) Chronic (years) since retiring from [employer], exacerbated with fall    Multiple Pain Sites  No    Pain Onset  More than a month ago    Pain Onset  More than a month ago      Treatment  UBE x 5 mins warm up Shoulder ranger fwd flex, horizontal abd/add Supine   shoulder serratus punch 5# 2 x 10 Supine 5 lbs  for flexion with horizontal abd/add  2 x 10 Supine biceps push 5 lbs x 20 Supine  shoulder D1 flexion and D2 extension RTB 2 x 10 each Supine 5 lbs  for flexion 2 x 10 MATRIX PNF  D1,D2  10.5  lbs x 10 x 2 L sidelying R shoulder ER,  2 lbs  2 x 10 Seated RTB rows 2 x 10 sidelying abd with 3 lbs x 20  Scapula retraction with RTB x 20  sidelying ER with 30 deg and 3 lbs x 20  sidelying fwd flex with 3 lbs and is too much, then 2 lbs, then 1 lb x 10 each  Patient has full ROM left shoudler abd and left shoulder flex  Patient has left shoulder fatigue with exercises.                       PT Education - 09/17/17 1457    Education provided  Yes    Education Details  HEP    Person(s) Educated  Patient    Methods  Explanation;Demonstration;Tactile cues;Verbal cues    Comprehension  Verbalized understanding;Returned demonstration       PT Short Term Goals - 07/22/17 1110      PT SHORT TERM GOAL #1   Title  Patient will demonstrate independence with home exercise program to develop upper extremity functional mobility.    Baseline  patient is doing his HEP    Time  2    Period  Weeks    Status  Achieved      PT SHORT TERM GOAL #2   Title  Patient will improve active L shoulder flexion to 70 and abduction to 70 to demonstrate gains in functional upper extremity mobility.    Baseline  Active L shoulder flexion 90, abduction 72    Time  2    Period  Weeks    Status  Achieved        PT Long Term Goals - 08/26/17 1632      PT LONG TERM GOAL #1   Title  Patient will demonstrate normal L scapular position at rest for improved upper extremity mobility and strength.    Baseline  L scapula elevated at rest.    Time  8    Period  Weeks    Status  Achieved    Target Date  08/26/17      PT LONG TERM GOAL #2   Title  Patient will demonstrate L grip strength of 85%   of grip strength on R to demonstrate return to  functional strength in left upper extremity.    Baseline  L hand 73% strength of R: L hand strength was 88% of right strength    Time  8    Period  Weeks    Status  Achieved      PT LONG TERM GOAL #3   Title  Patient will reduce QuickDASH score to 15% to demonstrate reduced upper extremity disability.    Baseline  QuickDASH 38%, 08/26/17 36%    Time  8    Period  Weeks    Status  Partially Met      PT LONG TERM GOAL #4   Title  Patient will demonstrate active L shoulder flexion to 120 and abduction to 130 to demonstrate full functional range in the upper extremity.    Baseline  Active L shoulder flexion 82, abduction 72; left shoulder abd 105,deg left flex 160 deg    Time  8    Period  Weeks    Status  Partially Met            Plan - 09/17/17 1504    Clinical Impression Statement  Patient demonstrates left shoulder weakness, and impaired mobility and lack of  LE strength   that is causing difficulty with ADL activities.  Patient still fatigues quickly to due to weak muscles. Patient showed good form and technique and is able to increase weight today.  Patient is able to perform exercises that were not possible several weeks ago due to shoulder pain.  Patient will continue to benefit from skilled physical therapy to improve strength and mobility to improve quality of life.     Rehab Potential  Good    Clinical Impairments Affecting Rehab Potential  + motivated, good general health, family support, acute injury / - pain not improving, age    PT Frequency  2x / week    PT Duration  8 weeks    PT Treatment/Interventions  Aquatic Therapy;Cryotherapy;Electrical Stimulation;Moist Heat;Traction;Ultrasound;Gait training;Functional mobility training;Therapeutic activities;Therapeutic exercise;Patient/family education;Neuromuscular re-education;Balance training;Manual techniques;Dry needling;Passive range of motion;Energy conservation;Taping    PT Next Visit Plan  Shoulder ROM, strengthening     Consulted and Agree with Plan of Care  Patient       Patient will benefit from skilled therapeutic intervention in order to improve the following deficits and impairments:  Pain, Postural dysfunction, Hypomobility, Decreased strength, Decreased range of motion, Decreased activity tolerance, Difficulty walking, Impaired perceived functional ability, Impaired flexibility, Impaired UE functional use, Decreased mobility  Visit Diagnosis: Acute pain of left shoulder  Muscle weakness (generalized)     Problem List Patient Active Problem List   Diagnosis Date Noted  . Fall with injury 05/22/2017  . AAA (abdominal aortic aneurysm) ([facility]) 05/22/2017  . Frequent headaches 01/15/2017  . Osteoarthritis 07/13/2016  . Plantar fasciitis 07/13/2016  . Encounter for abdominal aortic aneurysm (AAA) screening 02/17/2016  . Hyperlipidemia 11/27/2015  . Chronic right-sided thoracic back pain 11/27/2015  . Chronic fatigue 11/27/2015  . SVT (supraventricular tachycardia) ([facility]) 09/16/2015  . Paroxysmal atrial fibrillation ([facility]) 09/16/2015  . 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 Esophageal reflux 09/16/2015  . Preventative health care 09/16/2015    ,  S, PT DPT 09/17/2017, 3:06 PM  [facility] [address] [place], [place], [address] Phone: [phone]   Fax:  [phone]  Name: Adam Juarez MRN: [id number] Date of Birth: 03/02/1941   

## 2017-09-19 ENCOUNTER — Encounter: Payer: Medicare Other | Admitting: Physical Therapy

## 2017-09-20 ENCOUNTER — Encounter: Payer: Self-pay | Admitting: Registered"

## 2017-09-20 ENCOUNTER — Encounter (HOSPITAL_BASED_OUTPATIENT_CLINIC_OR_DEPARTMENT_OTHER): Payer: Medicare Other | Admitting: Registered"

## 2017-09-20 DIAGNOSIS — R7303 Prediabetes: Secondary | ICD-10-CM | POA: Diagnosis not present

## 2017-09-20 NOTE — Progress Notes (Signed)
Patient was seen on 09/20/17 for the Core Session 13 of Diabetes Prevention Program course at [facility]. By the end of this session patients are able to complete the following objectives:   Learning Objectives:  Describe ways to add interest and variety to their activity plans.  Define ?aerobic fitness.  Explain the four F.I.T.T. principles (frequency, intensity, time, and type of activity) and how they relate to aerobic fitness.   Goals:   Record weight taken outside of class.   Track foods and beverages eaten each day in the "Food and Activity Tracker," including calories and fat grams for each item.    Track activity type, minutes you were active, and distance you reached each day in the "Food and Activity Tracker."   Do your best to reach activity goal for the week.  Use one of the F.I.T.T. principles to jump start workouts.  Document activity level on the "To Do Next Week" handout.  Follow-Up Plan:  Attend Core Session 14 next week.   Bring completed "Food and Activity Tracker" next week to be reviewed by Lifestyle Coach.

## 2017-09-23 ENCOUNTER — Ambulatory Visit: Payer: Medicare Other

## 2017-09-23 VITALS — BP 155/89 | HR 75

## 2017-09-23 DIAGNOSIS — M25512 Pain in left shoulder: Secondary | ICD-10-CM | POA: Diagnosis not present

## 2017-09-23 DIAGNOSIS — M545 Low back pain: Secondary | ICD-10-CM | POA: Diagnosis not present

## 2017-09-23 DIAGNOSIS — M6281 Muscle weakness (generalized): Secondary | ICD-10-CM

## 2017-09-23 DIAGNOSIS — M546 Pain in thoracic spine: Secondary | ICD-10-CM | POA: Diagnosis not present

## 2017-09-23 NOTE — Therapy (Signed)
[place] MAIN [facility] SERVICES [address] [place], [place], [address] Phone: [phone]   Fax:  [phone]  Physical Therapy Treatment  Patient Details  Name: Adam Juarez MRN: [id number] Date of Birth: [birth year] Referring Provider: Pleas Koch, NP   Encounter Date: 09/23/2017  PT End of Session - 09/23/17 1409    Visit Number  19    Number of Visits  33    Date for PT Re-Evaluation  10/09/17    PT Start Time  0847    PT Stop Time  0930    PT Time Calculation (min)  43 min    Equipment Utilized During Treatment  Gait belt    Activity Tolerance  Patient tolerated treatment well    Behavior During Therapy  [facility] for tasks assessed/performed       Past Medical History:  Diagnosis Date  . Arthritis    fingers  . Cancer ([place]) 1991, 2011   squamous and basil  . Chicken pox   . Dysrhythmia 09/2013   Hx. a-fib x 1 episode patient states he "auto corrected" seen at [facility]  . GERD (gastroesophageal reflux disease)   . Headache    poor posture - none recently  . PSVT (paroxysmal supraventricular tachycardia) ([place]) 2009   Controlled with "breathing process"  . Renal stone 9/08, 6/15  . Wears dentures    full upper    Past Surgical History:  Procedure Laterality Date  . CARDIAC CATHETERIZATION  [address], [place]  . CATARACT EXTRACTION W/PHACO Left 07/27/2015   Procedure: CATARACT EXTRACTION PHACO AND INTRAOCULAR LENS PLACEMENT (IOC);  Surgeon: Leandrew Koyanagi, MD;  Location: [place];  Service: Ophthalmology;  Laterality: Left;  TORIC  . CATARACT EXTRACTION W/PHACO Right 08/24/2015   Procedure: CATARACT EXTRACTION PHACO AND INTRAOCULAR LENS PLACEMENT (IOC);  Surgeon: Leandrew Koyanagi, MD;  Location: [place];  Service: Ophthalmology;  Laterality: Right;  TORIC  . HEMORROIDECTOMY  2010   banding  . KIDNEY STONE SURGERY  9/08, 6/15   lithotripsy  . SKIN CANCER EXCISION    .  VASECTOMY      Vitals:   09/23/17 0852  BP: (!) 155/89  Pulse: 75  SpO2: 97%    Subjective Assessment - 09/23/17 0851    Subjective  Patient denies any pain today in his shoulder. He reports that he is making improvement with therapy. No changes in health recently. No specific questions or concerns currently.     Pertinent History  Patient was seen by this therapist on 1/28 with referral for back pain; indicated at that time that his back pain was not a problem but he wanted to be seen for his shoulder. Returned today for shoulder evaluation. Patient reported 3 falls in last year; most recent due to "clumsiness" on Jan 4 during a trip to [place]; fell "hard" onto concrete patio. Could not sleep secondary to pain, went to hospital; received x-rays and medications; x-rays apparently negative but have not been received by PCP at this time. The patient's back and hip were also injured in the fall but the shoulder is his primary complaint presenting with pain with overhead motion; the pain has not improved since the fall. Patient denies prior shoulder problems, N/T. He is having difficulty holding things. Reports that he is scoliotic and his L shoulder has always been elevated relative to his R shoulder.    Limitations  House hold activities;Lifting    How long can you  sit comfortably?  n/a    How long can you stand comfortably?  15 minutes    How long can you walk comfortably?  1 hr    Diagnostic tests  X-ray (not on file; pt reports negative xrays)    Patient Stated Goals  Patient wants to perform ADLs without pain; and be able to perform lifting, getting things off shelves without pain    Currently in Pain?  No/denies    Pain Onset  --    Pain Onset  --    Pain Onset  More than a month ago          TREATMENT  Ther-ex  Supine L shoulder serratus punch with manual resistance, stopped after 6 due to increase in pain; Supine 3#for L shoulder flexion 2 x 10; Supine L shoulder D2 flexion  RTB 2 x 10; Supine L shoulder rhythmic stabilization at wrist 30s x 2; R sidelying L shoulder abduction 2# 2 x 10; R sidelying L shoulder ER, 2#  2 x 10 Seated RTB rows 2 x 10  Pt requires considerable cues throughout session for proper form/technique;  Manual Therapy  Gentle grade I-II AP mobs to L shoulder at neutral for pain control 30s/bout x 2 bouts; Gentle L shoulder distraction intermittently throughout session for pain control while moving passively through abduction; Gentle L shoulder PROM with brief end range holds in each direction;                    PT Education - 09/23/17 1409    Education provided  Yes    Education Details  exercise form/technique    Person(s) Educated  Patient    Methods  Explanation    Comprehension  Verbalized understanding       PT Short Term Goals - 07/22/17 1110      PT SHORT TERM GOAL #1   Title  Patient will demonstrate independence with home exercise program to develop upper extremity functional mobility.    Baseline  patient is doing his HEP    Time  2    Period  Weeks    Status  Achieved      PT SHORT TERM GOAL #2   Title  Patient will improve active L shoulder flexion to 70 and abduction to 70 to demonstrate gains in functional upper extremity mobility.    Baseline  Active L shoulder flexion 90, abduction 72    Time  2    Period  Weeks    Status  Achieved        PT Long Term Goals - 08/26/17 1632      PT LONG TERM GOAL #1   Title  Patient will demonstrate normal L scapular position at rest for improved upper extremity mobility and strength.    Baseline  L scapula elevated at rest.    Time  8    Period  Weeks    Status  Achieved    Target Date  08/26/17      PT LONG TERM GOAL #2   Title  Patient will demonstrate L grip strength of 85% of grip strength on R to demonstrate return to functional strength in left upper extremity.    Baseline  L hand 73% strength of R: L hand strength was 88% of right strength     Time  8    Period  Weeks    Status  Achieved      PT LONG TERM GOAL #3  Title  Patient will reduce QuickDASH score to 15% to demonstrate reduced upper extremity disability.    Baseline  QuickDASH 38%, 08/26/17 36%    Time  8    Period  Weeks    Status  Partially Met      PT LONG TERM GOAL #4   Title  Patient will demonstrate active L shoulder flexion to 120 and abduction to 130 to demonstrate full functional range in the upper extremity.    Baseline  Active L shoulder flexion 82, abduction 72; left shoulder abd 105,deg left flex 160 deg    Time  8    Period  Weeks    Status  Partially Met            Plan - 09/23/17 1410    Clinical Impression Statement  Patient demonstrates left shoulder weakness and pain that is causing difficulty with ADL activities. Patient still fatigues quickly to due to weakness and reports an increase in pain in a variety of planes. Pt demonstrates good motivation with therapy and reports consistently with HEP. Pt encouraged to continue HEP and follow-up as scheduled. Patient will continue to benefit from skilled physical therapy to improve strength and mobility to improve quality of life. Session today is observed by four first year [employer] PT students in accordance with agreement by the patient.     Rehab Potential  Good    Clinical Impairments Affecting Rehab Potential  + motivated, good general health, family support, acute injury / - pain not improving, age    PT Frequency  2x / week    PT Duration  8 weeks    PT Treatment/Interventions  Aquatic Therapy;Cryotherapy;Electrical Stimulation;Moist Heat;Traction;Ultrasound;Gait training;Functional mobility training;Therapeutic activities;Therapeutic exercise;Patient/family education;Neuromuscular re-education;Balance training;Manual techniques;Dry needling;Passive range of motion;Energy conservation;Taping    PT Next Visit Plan  Shoulder ROM, strengthening    Consulted and Agree with Plan of Care  Patient        Patient will benefit from skilled therapeutic intervention in order to improve the following deficits and impairments:  Pain, Postural dysfunction, Hypomobility, Decreased strength, Decreased range of motion, Decreased activity tolerance, Difficulty walking, Impaired perceived functional ability, Impaired flexibility, Impaired UE functional use, Decreased mobility  Visit Diagnosis: Acute pain of left shoulder  Muscle weakness (generalized)     Problem List Patient Active Problem List   Diagnosis Date Noted  . Fall with injury 05/22/2017  . AAA (abdominal aortic aneurysm) ([place]) 05/22/2017  . Frequent headaches 01/15/2017  . Osteoarthritis 07/13/2016  . Plantar fasciitis 07/13/2016  . Encounter for abdominal aortic aneurysm (AAA) screening 02/17/2016  . Hyperlipidemia 11/27/2015  . Chronic right-sided thoracic back pain 11/27/2015  . Chronic fatigue 11/27/2015  . SVT (supraventricular tachycardia) ([place]) 09/16/2015  . Paroxysmal atrial fibrillation ([place]) 09/16/2015  . 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 Esophageal reflux 09/16/2015  . Preventative health care 09/16/2015   Phillips Grout PT, DPT   Jsiah Menta 09/23/2017, 2:17 PM  [place] MAIN [facility] SERVICES [address] [place], [place], [address] Phone: [phone]   Fax:  [phone]  Name: Adam Juarez MRN: [id number] Date of Birth: 1940-11-11

## 2017-09-25 ENCOUNTER — Ambulatory Visit: Payer: Medicare Other | Admitting: Physical Therapy

## 2017-09-25 ENCOUNTER — Encounter: Payer: Self-pay | Admitting: Physical Therapy

## 2017-09-25 DIAGNOSIS — M25512 Pain in left shoulder: Secondary | ICD-10-CM | POA: Diagnosis not present

## 2017-09-25 DIAGNOSIS — M545 Low back pain: Secondary | ICD-10-CM | POA: Diagnosis not present

## 2017-09-25 DIAGNOSIS — M6281 Muscle weakness (generalized): Secondary | ICD-10-CM | POA: Diagnosis not present

## 2017-09-25 DIAGNOSIS — M546 Pain in thoracic spine: Secondary | ICD-10-CM | POA: Diagnosis not present

## 2017-09-25 NOTE — Therapy (Addendum)
[place] MAIN [facility] SERVICES [address] [place], [place], [address] Phone: [phone]   Fax:  [phone]  Physical Therapy Treatment Physical Therapy Progress Note   Dates of reporting period  08/05/17  to   09/25/17  Patient Details  Name: Adam Juarez MRN: [id number] Date of Birth: 02-13-1941 Referring Provider: Pleas Koch, NP   Encounter Date: 09/25/2017  PT End of Session - 09/25/17 1704    Visit Number  20    Number of Visits  33    Date for PT Re-Evaluation  10/09/17    PT Start Time  0430    PT Stop Time  0515    PT Time Calculation (min)  45 min    Equipment Utilized During Treatment  Gait belt    Activity Tolerance  Patient tolerated treatment well    Behavior During Therapy  [facility] for tasks assessed/performed       Past Medical History:  Diagnosis Date  . Arthritis    fingers  . 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 Chicken pox   . Dysrhythmia 09/2013   Hx. a-fib x 1 episode patient states he "auto corrected" seen at [facility]  . GERD (gastroesophageal reflux disease)   . Headache    poor posture - none recently  . PSVT (paroxysmal supraventricular tachycardia) ([place]) 2009   Controlled with "breathing process"  . Renal stone 9/08, 6/15  . Wears dentures    full upper    Past Surgical History:  Procedure Laterality Date  . CARDIAC CATHETERIZATION  [address], [place]  . CATARACT EXTRACTION W/PHACO Left 07/27/2015   Procedure: CATARACT EXTRACTION PHACO AND INTRAOCULAR LENS PLACEMENT (IOC);  Surgeon: Leandrew Koyanagi, MD;  Location: [place];  Service: Ophthalmology;  Laterality: Left;  TORIC  . CATARACT EXTRACTION W/PHACO Right 08/24/2015   Procedure: CATARACT EXTRACTION PHACO AND INTRAOCULAR LENS PLACEMENT (IOC);  Surgeon: Leandrew Koyanagi, MD;  Location: [place];  Service: Ophthalmology;  Laterality: Right;  TORIC  . HEMORROIDECTOMY  2010   banding  .  KIDNEY STONE SURGERY  9/08, 6/15   lithotripsy  . SKIN CANCER EXCISION    . VASECTOMY      There were no vitals filed for this visit.  Subjective Assessment - 09/25/17 1703    Subjective  Patient reports that he is able to sleep on his left shoudler and he didnt used to be able to do that.     Pertinent History  Patient was seen by this therapist on 1/28 with referral for back pain; indicated at that time that his back pain was not a problem but he wanted to be seen for his shoulder. Returned today for shoulder evaluation. Patient reported 3 falls in last year; most recent due to "clumsiness" on Jan 4 during a trip to [place]; fell "hard" onto concrete patio. Could not sleep secondary to pain, went to hospital; received x-rays and medications; x-rays apparently negative but have not been received by PCP at this time. The patient's back and hip were also injured in the fall but the shoulder is his primary complaint presenting with pain with overhead motion; the pain has not improved since the fall. Patient denies prior shoulder problems, N/T. He is having difficulty holding things. Reports that he is scoliotic and his L shoulder has always been elevated relative to his R shoulder.    Limitations  House hold activities;Lifting    How long  can you sit comfortably?  n/a    How long can you stand comfortably?  15 minutes    How long can you walk comfortably?  1 hr    Diagnostic tests  X-ray (not on file; pt reports negative xrays)    Patient Stated Goals  Patient wants to perform ADLs without pain; and be able to perform lifting, getting things off shelves without pain    Currently in Pain?  No/denies    Pain Score  0-No pain    Pain Onset  Other (comment) Chronic (years) since retiring from [employer], exacerbated with fall    Pain Onset  More than a month ago    Pain Onset  More than a month ago       Treatment  UBE x 5 mins warm up Shoulder ranger fwd flex, horizontal abd/add Supine  shoulder  serratus punch 5# 2 x 10 Supine 5 lbs  for flexion with horizontal abd/add  2 x 10 Supine biceps push 5 lbs x 20 Supine  shoulder D1 flexion and D2 extension RTB 2 x 10 each Supine 5 lbs  for flexion 2 x 10 MATRIX PNF  D1,D2  10.5  lbs x 10 x 2 L sidelying R shoulder ER,  2 lbs  2 x 10 Seated RTB rows 2 x 10 sidelying abd with 3 lbs x 20  Scapula retraction with RTB x 20  sidelying ER with 30 deg and 3 lbs x 20  sidelying fwd flex with 3 lbs and is too much, then 2 lbs, then 1 lb x 10 each  Patient has full ROM left shoudler abd and left shoulder flex  Patient has left shoulder fatigue with exercises.                         PT Education - 09/25/17 1704    Education provided  Yes    Education Details  HEP    Person(s) Educated  Patient    Methods  Explanation    Comprehension  Verbalized understanding       PT Short Term Goals - 07/22/17 1110      PT SHORT TERM GOAL #1   Title  Patient will demonstrate independence with home exercise program to develop upper extremity functional mobility.    Baseline  patient is doing his HEP    Time  2    Period  Weeks    Status  Achieved      PT SHORT TERM GOAL #2   Title  Patient will improve active L shoulder flexion to 70 and abduction to 70 to demonstrate gains in functional upper extremity mobility.    Baseline  Active L shoulder flexion 90, abduction 72    Time  2    Period  Weeks    Status  Achieved        PT Long Term Goals - 08/26/17 1632      PT LONG TERM GOAL #1   Title  Patient will demonstrate normal L scapular position at rest for improved upper extremity mobility and strength.    Baseline  L scapula elevated at rest.    Time  8    Period  Weeks    Status  Achieved    Target Date  08/26/17      PT LONG TERM GOAL #2   Title  Patient will demonstrate L grip strength of 85% of grip strength on R to demonstrate return to functional  strength in left upper extremity.    Baseline  L hand 73%  strength of R: L hand strength was 88% of right strength    Time  8    Period  Weeks    Status  Achieved      PT LONG TERM GOAL #3   Title  Patient will reduce QuickDASH score to 15% to demonstrate reduced upper extremity disability.    Baseline  QuickDASH 38%, 08/26/17 36%    Time  8    Period  Weeks    Status  Partially Met      PT LONG TERM GOAL #4   Title  Patient will demonstrate active L shoulder flexion to 120 and abduction to 130 to demonstrate full functional range in the upper extremity.    Baseline  Active L shoulder flexion 82, abduction 72; left shoulder abd 105,deg left flex 160 deg    Time  8    Period  Weeks    Status  Partially Met            Plan - 09/25/17 1707    Clinical Impression Statement  Patient's condition has the potential to improve in response to therapy. Maximum improvement is yet to be obtained. The anticipated improvement is attainable and reasonable in a generally predictable time. Start date of reporting period 08/05/17 end date of reporting period 09/25/17. Patient reports that he is able to sleep on his left shoulder side again and can raise up his arm without much pain and he is getting stronger.  Patient has decreased strength of LUE 3/5 left shoulder flex and abd. Patient has improve ROM and no more pain in end range. Patient is able to increase weights with LUE . He will continue to benefit  from skilled Pt to improve strength and mobility.     Rehab Potential  Good    Clinical Impairments Affecting Rehab Potential  + motivated, good general health, family support, acute injury / - pain not improving, age    PT Frequency  2x / week    PT Duration  8 weeks    PT Treatment/Interventions  Aquatic Therapy;Cryotherapy;Electrical Stimulation;Moist Heat;Traction;Ultrasound;Gait training;Functional mobility training;Therapeutic activities;Therapeutic exercise;Patient/family education;Neuromuscular re-education;Balance training;Manual techniques;Dry  needling;Passive range of motion;Energy conservation;Taping    PT Next Visit Plan  Shoulder ROM, strengthening    Consulted and Agree with Plan of Care  Patient       Patient will benefit from skilled therapeutic intervention in order to improve the following deficits and impairments:  Pain, Postural dysfunction, Hypomobility, Decreased strength, Decreased range of motion, Decreased activity tolerance, Difficulty walking, Impaired perceived functional ability, Impaired flexibility, Impaired UE functional use, Decreased mobility  Visit Diagnosis: Acute pain of left shoulder  Muscle weakness (generalized)     Problem List Patient Active Problem List   Diagnosis Date Noted  . Fall with injury 05/22/2017  . AAA (abdominal aortic aneurysm) ([place]) 05/22/2017  . Frequent headaches 01/15/2017  . Osteoarthritis 07/13/2016  . Plantar fasciitis 07/13/2016  . Encounter for abdominal aortic aneurysm (AAA) screening 02/17/2016  . Hyperlipidemia 11/27/2015  . Chronic right-sided thoracic back pain 11/27/2015  . Chronic fatigue 11/27/2015  . SVT (supraventricular tachycardia) ([place]) 09/16/2015  . Paroxysmal atrial fibrillation ([place]) 09/16/2015  . 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 Esophageal reflux 09/16/2015  . Preventative health care 09/16/2015    Arelia Sneddon S,PT DPT 09/25/2017, 5:20 PM  [place] MAIN [facility] SERVICES [address] [place], [place], [address] Phone: [phone]  Fax:  [phone]  Name: Adam Juarez MRN: [id number] Date of Birth: [birth year]

## 2017-09-26 DIAGNOSIS — H353132 Nonexudative age-related macular degeneration, bilateral, intermediate dry stage: Secondary | ICD-10-CM | POA: Diagnosis not present

## 2017-09-27 ENCOUNTER — Encounter (HOSPITAL_BASED_OUTPATIENT_CLINIC_OR_DEPARTMENT_OTHER): Payer: Medicare Other | Admitting: Registered"

## 2017-09-27 ENCOUNTER — Encounter: Payer: Self-pay | Admitting: Registered"

## 2017-09-27 DIAGNOSIS — R7303 Prediabetes: Secondary | ICD-10-CM

## 2017-09-27 NOTE — Progress Notes (Signed)
Patient was seen on 09/27/17 for the Core Session 14 of Diabetes Prevention Program course at [facility]. By the end of this session patients are able to complete the following objectives:   Learning Objectives:  Give examples of problem social cues and helpful social cues.   Explain how to remove problem social cues and add helpful ones.   Describe ways of coping with vacations and social events such as parties, holidays, and visits from relatives and friends.   Create an action plan to change a problem social cue and add a helpful one.   Goals:   Record weight taken outside of class.   Track foods and beverages eaten each day in the "Food and Activity Tracker," including calories and fat grams for each item.    Track activity type, minutes you were active, and distance you reached each day in the "Food and Activity Tracker."   Do your best to reach activity goal for the week.  Use action plan created during session to change a problem social cue and add a helpful social cue.   Answer questions regarding success of changing social cues on "To Do Next Week" handout.   Follow-Up Plan:  Attend Core Session 15 next week.   Bring completed "Food and Activity Tracker" next week to be reviewed by Lifestyle Coach.

## 2017-09-30 ENCOUNTER — Encounter: Payer: Self-pay | Admitting: Physical Therapy

## 2017-09-30 ENCOUNTER — Ambulatory Visit: Payer: Medicare Other | Admitting: Physical Therapy

## 2017-09-30 DIAGNOSIS — M25512 Pain in left shoulder: Secondary | ICD-10-CM

## 2017-09-30 DIAGNOSIS — M546 Pain in thoracic spine: Secondary | ICD-10-CM

## 2017-09-30 DIAGNOSIS — M6281 Muscle weakness (generalized): Secondary | ICD-10-CM

## 2017-09-30 DIAGNOSIS — M545 Low back pain: Secondary | ICD-10-CM | POA: Diagnosis not present

## 2017-09-30 NOTE — Therapy (Signed)
[place] MAIN [facility] SERVICES [address] [place], [place], [address] Phone: [phone]   Fax:  [phone]  Physical Therapy Treatment  Patient Details  Name: Adam Juarez MRN: [id number] Date of Birth: June 12, 1940 Referring Provider: Pleas Koch, NP   Encounter Date: 09/30/2017  PT End of Session - 09/30/17 1422    Visit Number  21    Number of Visits  33    Date for PT Re-Evaluation  10/09/17    PT Start Time  0215    PT Stop Time  0300    PT Time Calculation (min)  45 min    Equipment Utilized During Treatment  Gait belt    Activity Tolerance  Patient tolerated treatment well    Behavior During Therapy  [facility] for tasks assessed/performed       Past Medical History:  Diagnosis Date  . Arthritis    fingers  . 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 Chicken pox   . Dysrhythmia 09/2013   Hx. a-fib x 1 episode patient states he "auto corrected" seen at [facility]  . GERD (gastroesophageal reflux disease)   . Headache    poor posture - none recently  . PSVT (paroxysmal supraventricular tachycardia) ([place]) 2009   Controlled with "breathing process"  . Renal stone 9/08, 6/15  . Wears dentures    full upper    Past Surgical History:  Procedure Laterality Date  . CARDIAC CATHETERIZATION  [address] [place]  . CATARACT EXTRACTION W/PHACO Left 07/27/2015   Procedure: CATARACT EXTRACTION PHACO AND INTRAOCULAR LENS PLACEMENT (IOC);  Surgeon: Leandrew Koyanagi, MD;  Location: [place];  Service: Ophthalmology;  Laterality: Left;  TORIC  . CATARACT EXTRACTION W/PHACO Right 08/24/2015   Procedure: CATARACT EXTRACTION PHACO AND INTRAOCULAR LENS PLACEMENT (IOC);  Surgeon: Leandrew Koyanagi, MD;  Location: [place];  Service: Ophthalmology;  Laterality: Right;  TORIC  . HEMORROIDECTOMY  2010   banding  . KIDNEY STONE SURGERY  9/08, 6/15   lithotripsy  . SKIN CANCER EXCISION    .  VASECTOMY      There were no vitals filed for this visit.  Subjective Assessment - 09/30/17 1418    Subjective  Patient reports that he is able to sleep on his left shoudler and he didnt used to be able to do that.     Pertinent History  Patient was seen by this therapist on 1/28 with referral for back pain; indicated at that time that his back pain was not a problem but he wanted to be seen for his shoulder. Returned today for shoulder evaluation. Patient reported 3 falls in last year; most recent due to "clumsiness" on Jan 4 during a trip to [place]; fell "hard" onto concrete patio. Could not sleep secondary to pain, went to hospital; received x-rays and medications; x-rays apparently negative but have not been received by PCP at this time. The patient's back and hip were also injured in the fall but the shoulder is his primary complaint presenting with pain with overhead motion; the pain has not improved since the fall. Patient denies prior shoulder problems, N/T. He is having difficulty holding things. Reports that he is scoliotic and his L shoulder has always been elevated relative to his R shoulder.    Limitations  House hold activities;Lifting    How long can you sit comfortably?  n/a    How long can you stand comfortably?  15  minutes    How long can you walk comfortably?  1 hr    Diagnostic tests  X-ray (not on file; pt reports negative xrays)    Patient Stated Goals  Patient wants to perform ADLs without pain; and be able to perform lifting, getting things off shelves without pain    Currently in Pain?  No/denies    Pain Score  0-No pain    Pain Onset  Other (comment) Chronic (years) since retiring from [employer], exacerbated with fall    Pain Onset  More than a month ago    Pain Onset  More than a month ago       Treatment  UBE x 5 mins warm up Shoulder ranger fwd flex, horizontal abd/add Supine  shoulder serratus punch 5# 2 x 10 Supine 5 lbs  for flexion with horizontal abd/add  2 x  10 Supine biceps push 5 lbs x 20 Supine  shoulder D1 flexion and D2 extension RTB 2 x 10 each Supine 5 lbs  for flexion 2 x 10 MATRIX PNF  D1,D2  11.5  lbs x 10 x 2 L sidelying R shoulder ER,  2 lbs  2 x 10 Seated RTB rows 2 x 10 sidelying abd with 3 lbs x 20  Scapula retraction with RTB x 20  sidelying ER with 30 deg and 3 lbs x 20  sidelying fwd flex with 3 lbs and is too much, then 2 lbs, then 1 lb x 10 each  Patient has full ROM left shoudler abd and left shoulder flex  Patient has left shoulder fatigue with exercises                        PT Education - 09/30/17 1421    Education provided  Yes    Education Details  HEP    Person(s) Educated  Patient    Methods  Explanation;Demonstration;Tactile cues    Comprehension  Verbalized understanding;Returned demonstration       PT Short Term Goals - 07/22/17 1110      PT SHORT TERM GOAL #1   Title  Patient will demonstrate independence with home exercise program to develop upper extremity functional mobility.    Baseline  patient is doing his HEP    Time  2    Period  Weeks    Status  Achieved      PT SHORT TERM GOAL #2   Title  Patient will improve active L shoulder flexion to 70 and abduction to 70 to demonstrate gains in functional upper extremity mobility.    Baseline  Active L shoulder flexion 90, abduction 72    Time  2    Period  Weeks    Status  Achieved        PT Long Term Goals - 09/30/17 1422      PT LONG TERM GOAL #1   Title  Patient will demonstrate normal L scapular position at rest for improved upper extremity mobility and strength.    Baseline  L scapula elevated at rest.    Time  8    Period  Weeks    Status  Achieved      PT LONG TERM GOAL #2   Title  Patient will demonstrate L grip strength of 85% of grip strength on R to demonstrate return to functional strength in left upper extremity.    Baseline  L hand 73% strength of R: L hand strength was 88% of right  strength     Time  8    Period  Weeks    Status  Achieved      PT LONG TERM GOAL #3   Title  Patient will reduce QuickDASH score to 15% to demonstrate reduced upper extremity disability.    Baseline  QuickDASH 38%, 08/26/17 36% 09/30/17= 27.27 %    Time  8    Period  Weeks    Status  Partially Met    Target Date  10/09/17      PT LONG TERM GOAL #4   Title  Patient will demonstrate active L shoulder flexion to 120 and abduction to 130 to demonstrate full functional range in the upper extremity.    Baseline  Active L shoulder flexion 82, abduction 72; left shoulder abd 105,deg left flex 160 deg    Time  8    Period  Weeks    Status  Achieved      PT LONG TERM GOAL #5   Title  Patient will increase LUE gross strength to 4+/5 as to improve functional strength for independent gait, increased standing tolerance and increased ADL ability.    Time  8    Period  Weeks    Status  New    Target Date  10/09/17            Plan - 09/30/17 1427    Clinical Impression Statement  Patient instructed in advanced RUE strengthening and stabilization exercise; patient responded well to instruction with less shoulder pain; Patient does require cues for correct exercise technique and to improve posture for better strengthening; Patient would benefit from additional skilled PT intervention to improve shoulder ROM and reduce pain with ADLs;    Rehab Potential  Good    Clinical Impairments Affecting Rehab Potential  + motivated, good general health, family support, acute injury / - pain not improving, age    PT Frequency  2x / week    PT Duration  8 weeks    PT Treatment/Interventions  Aquatic Therapy;Cryotherapy;Electrical Stimulation;Moist Heat;Traction;Ultrasound;Gait training;Functional mobility training;Therapeutic activities;Therapeutic exercise;Patient/family education;Neuromuscular re-education;Balance training;Manual techniques;Dry needling;Passive range of motion;Energy conservation;Taping    PT Next Visit  Plan  Shoulder ROM, strengthening    Consulted and Agree with Plan of Care  Patient       Patient will benefit from skilled therapeutic intervention in order to improve the following deficits and impairments:  Pain, Postural dysfunction, Hypomobility, Decreased strength, Decreased range of motion, Decreased activity tolerance, Difficulty walking, Impaired perceived functional ability, Impaired flexibility, Impaired UE functional use, Decreased mobility  Visit Diagnosis: Acute pain of left shoulder  Muscle weakness (generalized)  Acute low back pain, unspecified back pain laterality, with sciatica presence unspecified  Pain in thoracic spine     Problem List Patient Active Problem List   Diagnosis Date Noted  . Fall with injury 05/22/2017  . AAA (abdominal aortic aneurysm) ([place]) 05/22/2017  . Frequent headaches 01/15/2017  . Osteoarthritis 07/13/2016  . Plantar fasciitis 07/13/2016  . Encounter for abdominal aortic aneurysm (AAA) screening 02/17/2016  . Hyperlipidemia 11/27/2015  . Chronic right-sided thoracic back pain 11/27/2015  . Chronic fatigue 11/27/2015  . SVT (supraventricular tachycardia) ([place]) 09/16/2015  . Paroxysmal atrial fibrillation ([place]) 09/16/2015  . 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 Esophageal reflux 09/16/2015  . Preventative health care 09/16/2015    Alanson Puls, PT DPT 09/30/2017, 2:38 PM  [place] MAIN [facility] SERVICES [address] [place], [place], [address] Phone: [phone]  Fax:  [phone]  Name: Adam Juarez MRN: [id number] Date of Birth: [birth year]

## 2017-10-02 ENCOUNTER — Encounter: Payer: Medicare Other | Admitting: Physical Therapy

## 2017-10-02 DIAGNOSIS — L570 Actinic keratosis: Secondary | ICD-10-CM | POA: Diagnosis not present

## 2017-10-02 DIAGNOSIS — Z85828 Personal history of other malignant neoplasm of skin: Secondary | ICD-10-CM | POA: Diagnosis not present

## 2017-10-02 DIAGNOSIS — Z08 Encounter for follow-up examination after completed treatment for malignant neoplasm: Secondary | ICD-10-CM | POA: Diagnosis not present

## 2017-10-02 DIAGNOSIS — L304 Erythema intertrigo: Secondary | ICD-10-CM | POA: Diagnosis not present

## 2017-10-02 DIAGNOSIS — X32XXXA Exposure to sunlight, initial encounter: Secondary | ICD-10-CM | POA: Diagnosis not present

## 2017-10-03 ENCOUNTER — Encounter: Payer: Medicare Other | Admitting: Physical Therapy

## 2017-10-04 ENCOUNTER — Encounter (HOSPITAL_BASED_OUTPATIENT_CLINIC_OR_DEPARTMENT_OTHER): Payer: Medicare Other | Admitting: Registered"

## 2017-10-04 ENCOUNTER — Encounter: Payer: Self-pay | Admitting: Registered"

## 2017-10-04 DIAGNOSIS — R7303 Prediabetes: Secondary | ICD-10-CM | POA: Diagnosis not present

## 2017-10-04 NOTE — Progress Notes (Signed)
Patient was seen on 10/04/17 for the Core Session 15 of Diabetes Prevention Program course at [facility]. By the end of this session patients are able to complete the following objectives:   Learning Objectives:  Explain how to prevent stress or cope with unavoidable stress.   Describe how this program can be a source of stress.   Explain how to manage stressful situations.   Create and follow an action plan for either preventing or coping with a stressful situation.   Goals:   Record weight taken outside of class.   Track foods and beverages eaten each day in the "Food and Activity Tracker," including calories and fat grams for each item.    Track activity type, minutes you were active, and distance you reached each day in the "Food and Activity Tracker."   Do your best to reach activity goal for the week.  Follow your action plan to reduce stress.   Answer questions on handout regarding success of action plan.   Follow-Up Plan:  Attend Core Session 16 next week.   Bring completed "Food and Activity Tracker" next week to be reviewed by Lifestyle Coach.

## 2017-10-08 ENCOUNTER — Ambulatory Visit: Payer: Medicare Other | Admitting: Physical Therapy

## 2017-10-08 ENCOUNTER — Encounter: Payer: Self-pay | Admitting: Physical Therapy

## 2017-10-08 DIAGNOSIS — M546 Pain in thoracic spine: Secondary | ICD-10-CM

## 2017-10-08 DIAGNOSIS — M25512 Pain in left shoulder: Secondary | ICD-10-CM

## 2017-10-08 DIAGNOSIS — M6281 Muscle weakness (generalized): Secondary | ICD-10-CM | POA: Diagnosis not present

## 2017-10-08 DIAGNOSIS — M545 Low back pain: Secondary | ICD-10-CM | POA: Diagnosis not present

## 2017-10-08 NOTE — Therapy (Signed)
[place] MAIN [facility] SERVICES [address] [place], [place], [address] Phone: [phone]   Fax:  [phone]  Physical Therapy Treatment/ Discharge Summary  Patient Details  Name: Adam Juarez MRN: [id number] Date of Birth: 1940-12-16 Referring Provider: Pleas Koch, NP   Encounter Date: 10/08/2017  PT End of Session - 10/08/17 1640    Visit Number  22    Number of Visits  33    Date for PT Re-Evaluation  10/09/17    PT Start Time  0430    PT Stop Time  0515    PT Time Calculation (min)  45 min    Equipment Utilized During Treatment  Gait belt    Activity Tolerance  Patient tolerated treatment well    Behavior During Therapy  [facility] for tasks assessed/performed       Past Medical History:  Diagnosis Date  . Arthritis    fingers  . 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 Chicken pox   . Dysrhythmia 09/2013   Hx. a-fib x 1 episode patient states he "auto corrected" seen at [facility]  . GERD (gastroesophageal reflux disease)   . Headache    poor posture - none recently  . PSVT (paroxysmal supraventricular tachycardia) ([place]) 2009   Controlled with "breathing process"  . Renal stone 9/08, 6/15  . Wears dentures    full upper    Past Surgical History:  Procedure Laterality Date  . CARDIAC CATHETERIZATION  [address] [place]  . CATARACT EXTRACTION W/PHACO Left 07/27/2015   Procedure: CATARACT EXTRACTION PHACO AND INTRAOCULAR LENS PLACEMENT (IOC);  Surgeon: Leandrew Koyanagi, MD;  Location: [place];  Service: Ophthalmology;  Laterality: Left;  TORIC  . CATARACT EXTRACTION W/PHACO Right 08/24/2015   Procedure: CATARACT EXTRACTION PHACO AND INTRAOCULAR LENS PLACEMENT (IOC);  Surgeon: Leandrew Koyanagi, MD;  Location: [place];  Service: Ophthalmology;  Laterality: Right;  TORIC  . HEMORROIDECTOMY  2010   banding  . KIDNEY STONE SURGERY  9/08, 6/15   lithotripsy  . SKIN CANCER  EXCISION    . VASECTOMY      There were no vitals filed for this visit.  Subjective Assessment - 10/08/17 1637    Subjective  Patient reports that he is able to sleep on his left shoudler and he didnt used to be able to do that.     Pertinent History  Patient was seen by this therapist on 1/28 with referral for back pain; indicated at that time that his back pain was not a problem but he wanted to be seen for his shoulder. Returned today for shoulder evaluation. Patient reported 3 falls in last year; most recent due to "clumsiness" on Jan 4 during a trip to [place]; fell "hard" onto concrete patio. Could not sleep secondary to pain, went to hospital; received x-rays and medications; x-rays apparently negative but have not been received by PCP at this time. The patient's back and hip were also injured in the fall but the shoulder is his primary complaint presenting with pain with overhead motion; the pain has not improved since the fall. Patient denies prior shoulder problems, N/T. He is having difficulty holding things. Reports that he is scoliotic and his L shoulder has always been elevated relative to his R shoulder.    Limitations  House hold activities;Lifting    How long can you sit comfortably?  n/a    How long can you stand comfortably?  15 minutes    How long can you walk comfortably?  1 hr    Diagnostic tests  X-ray (not on file; pt reports negative xrays)    Patient Stated Goals  Patient wants to perform ADLs without pain; and be able to perform lifting, getting things off shelves without pain    Currently in Pain?  No/denies    Pain Score  0-No pain    Pain Onset  Other (comment) Chronic (years) since retiring from [employer], exacerbated with fall    Pain Onset  More than a month ago    Pain Onset  More than a month ago      Treatment: UBE x 5 mins warm up Shoulder ranger fwd flex, horizontal abd/add Supine shoulder serratus punch 8# 2 x 10 Supine5 lbsfor flexion with horizontal  abd/add2 x 10 Supine biceps push 8 lbs x 20 Supine shoulder D1 flexion and D2 extension RTB 2 x 10 each Supine8 lbsfor flexion 2 x 10 MATRIX PNF D1,D2 11.5 lbs x 10 x 2 L sidelying R shoulder ER,2 lbs2 x 10 Seated RTB rows 2 x 10 sidelying abd with  3 lbs x 20  Scapula retraction with RTB x 20  sidelying ER with 30 deg and 3 lbs x 20  sidelying fwd flex with 3 lbs and is too much, then 2 lbs, then 1 lb x 10 each  Patient has full ROM left shoudler abd and left shoulder flex  Patient has left shoulder fatigue with exercises  Patient is being discharged today from PT.                      PT Education - 10/08/17 1640    Education provided  Yes    Education Details  HEP    Person(s) Educated  Patient    Methods  Explanation    Comprehension  Verbalized understanding       PT Short Term Goals - 07/22/17 1110      PT SHORT TERM GOAL #1   Title  Patient will demonstrate independence with home exercise program to develop upper extremity functional mobility.    Baseline  patient is doing his HEP    Time  2    Period  Weeks    Status  Achieved      PT SHORT TERM GOAL #2   Title  Patient will improve active L shoulder flexion to 70 and abduction to 70 to demonstrate gains in functional upper extremity mobility.    Baseline  Active L shoulder flexion 90, abduction 72    Time  2    Period  Weeks    Status  Achieved        PT Long Term Goals - 09/30/17 1422      PT LONG TERM GOAL #1   Title  Patient will demonstrate normal L scapular position at rest for improved upper extremity mobility and strength.    Baseline  L scapula elevated at rest.    Time  8    Period  Weeks    Status  Achieved      PT LONG TERM GOAL #2   Title  Patient will demonstrate L grip strength of 85% of grip strength on R to demonstrate return to functional strength in left upper extremity.    Baseline  L hand 73% strength of R: L hand strength was 88% of right strength     Time  8    Period  Weeks    Status  Achieved      PT LONG TERM GOAL #3   Title  Patient will reduce QuickDASH score to 15% to demonstrate reduced upper extremity disability.    Baseline  QuickDASH 38%, 08/26/17 36% 09/30/17= 27.27 %    Time  8    Period  Weeks    Status  Partially Met    Target Date  10/09/17      PT LONG TERM GOAL #4   Title  Patient will demonstrate active L shoulder flexion to 120 and abduction to 130 to demonstrate full functional range in the upper extremity.    Baseline  Active L shoulder flexion 82, abduction 72; left shoulder abd 105,deg left flex 160 deg    Time  8    Period  Weeks    Status  Achieved      PT LONG TERM GOAL #5   Title  Patient will increase LUE gross strength to 4+/5 as to improve functional strength for independent gait, increased standing tolerance and increased ADL ability.    Time  8    Period  Weeks    Status  New    Target Date  10/09/17            Plan - 10/08/17 1641    Clinical Impression Statement  Continuous verbal cues and tactile cues needed to correct form with exercises and for posture correction. Patient required min verbal cues to perform left shoulder exercises for posture and control and required verbal and tactile cues during all activities.  Patient is able to perform strengthening exercises with reports of pain Patient will be discharged  to HEP today .     Rehab Potential  Good    Clinical Impairments Affecting Rehab Potential  + motivated, good general health, family support, acute injury / - pain not improving, age    PT Frequency  2x / week    PT Duration  8 weeks    PT Treatment/Interventions  Aquatic Therapy;Cryotherapy;Electrical Stimulation;Moist Heat;Traction;Ultrasound;Gait training;Functional mobility training;Therapeutic activities;Therapeutic exercise;Patient/family education;Neuromuscular re-education;Balance training;Manual techniques;Dry needling;Passive range of motion;Energy conservation;Taping     PT Next Visit Plan  Shoulder ROM, strengthening    Consulted and Agree with Plan of Care  Patient       Patient will benefit from skilled therapeutic intervention in order to improve the following deficits and impairments:  Pain, Postural dysfunction, Hypomobility, Decreased strength, Decreased range of motion, Decreased activity tolerance, Difficulty walking, Impaired perceived functional ability, Impaired flexibility, Impaired UE functional use, Decreased mobility  Visit Diagnosis: Acute pain of left shoulder  Muscle weakness (generalized)  Acute low back pain, unspecified back pain laterality, with sciatica presence unspecified  Pain in thoracic spine     Problem List Patient Active Problem List   Diagnosis Date Noted  . Fall with injury 05/22/2017  . AAA (abdominal aortic aneurysm) ([place]) 05/22/2017  . Frequent headaches 01/15/2017  . Osteoarthritis 07/13/2016  . Plantar fasciitis 07/13/2016  . Encounter for abdominal aortic aneurysm (AAA) screening 02/17/2016  . Hyperlipidemia 11/27/2015  . Chronic right-sided thoracic back pain 11/27/2015  . Chronic fatigue 11/27/2015  . SVT (supraventricular tachycardia) ([place]) 09/16/2015  . Paroxysmal atrial fibrillation ([place]) 09/16/2015  . 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 Esophageal reflux 09/16/2015  . Preventative health care 09/16/2015    Alanson Puls, [place] DPT 10/08/2017, 5:36 PM  [place] MAIN [facility] SERVICES [address] [place], [place], [address] Phone: [phone]   Fax:  [phone]  Name: Adam Juarez MRN: [id number] Date of Birth: [birth year]

## 2017-10-10 ENCOUNTER — Ambulatory Visit: Payer: Medicare Other | Admitting: Physical Therapy

## 2017-10-11 ENCOUNTER — Encounter: Payer: Self-pay | Admitting: Registered"

## 2017-10-11 ENCOUNTER — Encounter (HOSPITAL_BASED_OUTPATIENT_CLINIC_OR_DEPARTMENT_OTHER): Payer: Medicare Other | Admitting: Registered"

## 2017-10-11 DIAGNOSIS — R7303 Prediabetes: Secondary | ICD-10-CM | POA: Diagnosis not present

## 2017-10-11 NOTE — Progress Notes (Signed)
Patient was seen on 10/11/17 for the Core Session 16 of Diabetes Prevention Program course at [facility]. By the end of this session patients are able to complete the following objectives:   Learning Objectives:  Measure their progress toward weight and physical activity goals since Session 1.   Develop a plan for improving progress, if their goals have not yet been attained.   Describe ways to stay motivated long-term.   Goals:   Record weight taken outside of class.   Track foods and beverages eaten each day in the "Food and Activity Tracker," including calories and fat grams for each item.    Track activity type, minutes you were active, and distance you reached each day in the "Food and Activity Tracker."   Utilize action plan to help stay motivated and complete questions on "To Do List."   Follow-Up Plan:  Attend session 17 next  week.   Bring completed "Food and Activity Tracker" next session to be reviewed by Lifestyle Coach.

## 2017-10-14 ENCOUNTER — Ambulatory Visit (INDEPENDENT_AMBULATORY_CARE_PROVIDER_SITE_OTHER): Payer: Medicare Other | Admitting: Family Medicine

## 2017-10-14 ENCOUNTER — Encounter: Payer: Self-pay | Admitting: Family Medicine

## 2017-10-14 VITALS — BP 120/70 | HR 70 | Temp 97.8°F | Ht 69.5 in | Wt 200.2 lb

## 2017-10-14 DIAGNOSIS — M65352 Trigger finger, left little finger: Secondary | ICD-10-CM | POA: Diagnosis not present

## 2017-10-14 MED ORDER — METHYLPREDNISOLONE ACETATE 40 MG/ML IJ SUSP
20.0000 mg | Freq: Once | INTRAMUSCULAR | Status: AC
  Administered 2017-10-14: 20 mg via INTRA_ARTICULAR

## 2017-10-14 NOTE — Progress Notes (Addendum)
   Dr. Frederico Hamman T. Dorothyann Mourer, MD, [facility] and Sports Medicine [place], [address] Phone: [phone] Fax: [phone]  10/14/2017  Patient: Adam Juarez, MRN: [id number], DOB: June 23, 1940, 77 y.o.  Primary Physician:  Pleas Koch, NP   Chief Complaint  Patient presents with  . Trigger Finger    Left Pinky    Subjective:   Acel Natzke is a 77 y.o. very pleasant male patient who presents with the following:  5th digit, L.  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   We discussed the pathophysiology of trigger fingers. Discussed the inflammatory nature of nodule creation and likely nodule abutting the A1 pulley system, this causing the patient's discomfort and sensations. We discussed that treatments for this include direct injection into the tendon sheath to attempt to shrink catching tissue. This can be done 1-2 times. Other treatments include surgical release. If the patient fails to trigger finger injections, I would recommend trigger finger release if the patient desires relief of the symptoms.   Trigger Finger Injection, L 5th Verbal consent was obtained. Risks (including rare risk of infection, potential risk for skin lightening and potential atrophy), benefits and alternatives were discussed. Prepped with Chloraprep and Ethyl Chloride used for anesthesia. Under sterile conditions, patient injected at palmar crease aiming distally with 45 degree angle towards nodule; injected directly into tendon sheath. Medication flowed freely without resistance.  Needle size: 22 gauge 1 1/2 inch Injection: 1/2 cc of Lidocaine 1% and Depo-Medrol 20 mg   Signed,  Jayni Prescher T. Veleda Mun, MD

## 2017-10-14 NOTE — Addendum Note (Signed)
Addended by: Carter Kitten on: 10/14/2017 09:41 AM   Modules accepted: Orders

## 2017-10-15 ENCOUNTER — Ambulatory Visit: Payer: Medicare Other | Admitting: Physical Therapy

## 2017-10-17 ENCOUNTER — Encounter: Payer: Medicare Other | Admitting: Physical Therapy

## 2017-10-18 ENCOUNTER — Encounter: Payer: Self-pay | Admitting: Registered"

## 2017-10-18 ENCOUNTER — Encounter: Payer: Medicare Other | Attending: Primary Care | Admitting: Registered"

## 2017-10-18 DIAGNOSIS — R7303 Prediabetes: Secondary | ICD-10-CM

## 2017-10-18 NOTE — Progress Notes (Signed)
Patient was seen on 10/18/17 for Session 17 of Diabetes Prevention Program course at [facility]. By the end of this session patients are able to complete the following objectives:   Learning Objectives:  Identify how to maintain and/or continue working toward program goals for the remainder of the program.   Describe ways that food and activity tracking can assist them in maintaining/reaching program goals.   Identify progress they have made since the beginning of the program.   Goals:   Record weight taken outside of class.   Track foods and beverages eaten each day in the "Food and Activity Tracker," including calories and fat grams for each item.    Track activity type, minutes you were active, and distance you reached each day in the "Food and Activity Tracker."   Follow-Up Plan:  Attend session 18 in two weeks.   Bring completed "Food and Activity Trackers" next session to be reviewed by Lifestyle Coach.

## 2017-10-22 ENCOUNTER — Encounter: Payer: Medicare Other | Admitting: Physical Therapy

## 2017-10-24 ENCOUNTER — Encounter: Payer: Medicare Other | Admitting: Physical Therapy

## 2017-10-29 ENCOUNTER — Encounter: Payer: Medicare Other | Admitting: Physical Therapy

## 2017-10-31 ENCOUNTER — Encounter: Payer: Medicare Other | Admitting: Physical Therapy

## 2017-11-01 ENCOUNTER — Encounter: Payer: Self-pay | Admitting: Registered"

## 2017-11-01 ENCOUNTER — Encounter (HOSPITAL_BASED_OUTPATIENT_CLINIC_OR_DEPARTMENT_OTHER): Payer: Medicare Other | Admitting: Registered"

## 2017-11-01 DIAGNOSIS — R7303 Prediabetes: Secondary | ICD-10-CM

## 2017-11-01 NOTE — Progress Notes (Signed)
Patient was seen on 11/01/17 for Session 18 of Diabetes Prevention Program course at [facility]. By the end of this session patients are able to complete the following objectives:   Learning Objectives:  Explain how glucose is used in the body and it's relationship with insulin/insulin resistance.   Identify symptoms of diabetes.   Describe lab tests used to diagnose diabetes.   Describe health complications and conditions related to diabetes.   Goals:   Record weight taken outside of class.   Track foods and beverages eaten each day in the "Food and Activity Tracker," including calories and fat grams for each item.    Track activity type, minutes you were active, and distance you reached each day in the "Food and Activity Tracker."   Follow-Up Plan:  Attend session 19.   Bring completed "Food and Activity Trackers" to next session to be reviewed by Lifestyle Coach.

## 2017-11-11 HISTORY — PX: TOENAIL TRIMMING: SHX6631

## 2017-11-29 ENCOUNTER — Encounter: Payer: Medicare Other | Attending: Primary Care | Admitting: Registered"

## 2017-11-29 ENCOUNTER — Encounter: Payer: Self-pay | Admitting: Registered"

## 2017-11-29 DIAGNOSIS — R7303 Prediabetes: Secondary | ICD-10-CM | POA: Diagnosis not present

## 2017-11-29 NOTE — Progress Notes (Signed)
Patient was seen on 11/29/17 for Session 19 of Diabetes Prevention Program course at [facility]. By the end of this session patients are able to complete the following objectives:   Learning Objectives:  List ways to make recipes healthier.   List lower-fat and lower-calorie substitutions for common ingredients.   Identify low-fat cooking methods.   Describe how to choose a cookbook that works best for their needs.   Goals:   Record weight taken outside of class.   Track foods and beverages eaten each day in the "Food and Activity Tracker," including calories and fat grams for each item.    Track activity type, minutes you were active, and distance you reached each day in the "Food and Activity Tracker."   Follow-Up Plan:  Attend session 20.   Bring completed "Food and Activity Trackers" to next session to be reviewed by Lifestyle Coach.

## 2017-12-11 ENCOUNTER — Encounter (INDEPENDENT_AMBULATORY_CARE_PROVIDER_SITE_OTHER): Payer: Self-pay

## 2017-12-13 ENCOUNTER — Encounter: Payer: Self-pay | Admitting: Registered"

## 2017-12-13 ENCOUNTER — Encounter: Payer: Medicare Other | Attending: Primary Care | Admitting: Registered"

## 2017-12-13 DIAGNOSIS — R7303 Prediabetes: Secondary | ICD-10-CM

## 2017-12-13 NOTE — Progress Notes (Signed)
Patient was seen on 12/13/17 for Session 20 of the Diabetes Prevention Program course at [facility]. By the end of this session patients are able to complete the following objectives:   Learning Objectives:  Describe how to incorporate more fruits and vegetables into meals.  List criteria for selecting good quality fruits and vegetables at the store.   Define mindful eating.  List the benefits of eating mindfully.   Goals:   Record weight taken outside of class.   Track foods and beverages eaten each day in the "Food and Activity Tracker," including calories and fat grams for each item.    Track activity type, minutes you were active, and distance you reached each day in the "Food and Activity Tracker."   Follow-Up Plan:  Attend next session.   Bring completed "Food and Activity Trackers" to next session to be reviewed by Lifestyle Coach.

## 2017-12-30 DIAGNOSIS — B351 Tinea unguium: Secondary | ICD-10-CM | POA: Diagnosis not present

## 2017-12-30 DIAGNOSIS — D2372 Other benign neoplasm of skin of left lower limb, including hip: Secondary | ICD-10-CM | POA: Diagnosis not present

## 2018-01-17 ENCOUNTER — Encounter: Payer: Medicare Other | Attending: Primary Care | Admitting: Registered"

## 2018-01-17 ENCOUNTER — Encounter: Payer: Self-pay | Admitting: Registered"

## 2018-01-17 DIAGNOSIS — R7303 Prediabetes: Secondary | ICD-10-CM | POA: Diagnosis not present

## 2018-01-17 NOTE — Progress Notes (Signed)
Patient was seen on 01/17/18 for the Diabetes Prevention Program course at [facility]. By the end of this session patients are able to complete the following objectives:   Learning Objectives:  Define fiber and describe the difference between insoluble and soluble fiber   List foods that are good sources of fiber  Explain the health benefits of fiber   Describe ways to increase volume of meals and snacks while staying within fat goal.   Goals:   Record weight taken outside of class.   Track foods and beverages eaten each day in the "Food and Activity Tracker," including calories and fat grams for each item.    Track activity type, minutes you were active, and distance you reached each day in the "Food and Activity Tracker."   Follow-Up Plan:  Attend next session.   Bring completed "Food and Activity Trackers" to next session to be reviewed by Lifestyle Coach.

## 2018-01-23 ENCOUNTER — Ambulatory Visit: Payer: Medicare Other

## 2018-01-23 ENCOUNTER — Other Ambulatory Visit: Payer: Self-pay | Admitting: Primary Care

## 2018-01-23 ENCOUNTER — Other Ambulatory Visit (INDEPENDENT_AMBULATORY_CARE_PROVIDER_SITE_OTHER): Payer: Medicare Other

## 2018-01-23 DIAGNOSIS — R972 Elevated prostate specific antigen [PSA]: Secondary | ICD-10-CM | POA: Diagnosis not present

## 2018-01-23 DIAGNOSIS — R7303 Prediabetes: Secondary | ICD-10-CM

## 2018-01-23 DIAGNOSIS — E782 Mixed hyperlipidemia: Secondary | ICD-10-CM

## 2018-01-23 LAB — COMPREHENSIVE METABOLIC PANEL
ALT: 14 U/L (ref 0–53)
AST: 17 U/L (ref 0–37)
Albumin: 4.5 g/dL (ref 3.5–5.2)
Alkaline Phosphatase: 66 U/L (ref 39–117)
BUN: 25 mg/dL — AB (ref 6–23)
CHLORIDE: 103 meq/L (ref 96–112)
CO2: 28 mEq/L (ref 19–32)
CREATININE: 0.94 mg/dL (ref 0.40–1.50)
Calcium: 9.8 mg/dL (ref 8.4–10.5)
GFR: 82.92 mL/min (ref >60.00)
GLUCOSE: 108 mg/dL — AB (ref 70–99)
Potassium: 4.1 mEq/L (ref 3.5–5.1)
SODIUM: 140 meq/L (ref 135–145)
Total Bilirubin: 0.6 mg/dL (ref 0.2–1.2)
Total Protein: 7.4 g/dL (ref 6.0–8.3)

## 2018-01-23 LAB — HEMOGLOBIN A1C: Hgb A1c MFr Bld: 5.7 % (ref 4.6–6.5)

## 2018-01-23 LAB — LIPID PANEL
CHOL/HDL RATIO: 5
Cholesterol: 167 mg/dL (ref 0–200)
HDL: 31.2 mg/dL — AB (ref >39.00)
NONHDL: 135.98
Triglycerides: 359 mg/dL — ABNORMAL HIGH (ref 0.0–149.0)
VLDL: 71.8 mg/dL — AB (ref 0.0–40.0)

## 2018-01-23 LAB — PSA: PSA: 5.26 ng/mL — AB (ref 0.10–4.00)

## 2018-01-23 LAB — LDL CHOLESTEROL, DIRECT: LDL DIRECT: 76 mg/dL

## 2018-01-28 ENCOUNTER — Encounter: Payer: Self-pay | Admitting: Primary Care

## 2018-01-28 ENCOUNTER — Ambulatory Visit (INDEPENDENT_AMBULATORY_CARE_PROVIDER_SITE_OTHER): Payer: Medicare Other | Admitting: Primary Care

## 2018-01-28 ENCOUNTER — Ambulatory Visit: Payer: Medicare Other

## 2018-01-28 VITALS — BP 120/76 | HR 68 | Temp 98.0°F | Ht 69.5 in | Wt 203.8 lb

## 2018-01-28 DIAGNOSIS — Z136 Encounter for screening for cardiovascular disorders: Secondary | ICD-10-CM | POA: Diagnosis not present

## 2018-01-28 DIAGNOSIS — Z87898 Personal history of other specified conditions: Secondary | ICD-10-CM

## 2018-01-28 DIAGNOSIS — Z0000 Encounter for general adult medical examination without abnormal findings: Secondary | ICD-10-CM

## 2018-01-28 DIAGNOSIS — R7303 Prediabetes: Secondary | ICD-10-CM

## 2018-01-28 DIAGNOSIS — E782 Mixed hyperlipidemia: Secondary | ICD-10-CM | POA: Diagnosis not present

## 2018-01-28 DIAGNOSIS — I480 Paroxysmal atrial fibrillation: Secondary | ICD-10-CM

## 2018-01-28 DIAGNOSIS — Z91030 Bee allergy status: Secondary | ICD-10-CM | POA: Diagnosis not present

## 2018-01-28 DIAGNOSIS — Z23 Encounter for immunization: Secondary | ICD-10-CM | POA: Diagnosis not present

## 2018-01-28 DIAGNOSIS — I714 Abdominal aortic aneurysm, without rupture, unspecified: Secondary | ICD-10-CM

## 2018-01-28 DIAGNOSIS — K219 Gastro-esophageal reflux disease without esophagitis: Secondary | ICD-10-CM

## 2018-01-28 MED ORDER — EPINEPHRINE 0.3 MG/0.3ML IJ SOAJ: 0.3000 mg | Freq: Once | INTRAMUSCULAR | 0 refills | Status: AC

## 2018-01-28 NOTE — Assessment & Plan Note (Signed)
Repeat A1C reduced from 5.9 to 5.7. Commended him on this success as this was likely all dietary. Continue diabetes nutrition classes. Continue to monitor. Repeat in 6 months.

## 2018-01-28 NOTE — Assessment & Plan Note (Signed)
Repeat ultrasound pending.

## 2018-01-28 NOTE — Assessment & Plan Note (Signed)
Overall stable on omeprazole daily, continue same.

## 2018-01-28 NOTE — Progress Notes (Signed)
Subjective:   Adam Juarez is a 77 y.o. male who presents for Medicare Annual/Subsequent preventive examination.  Review of Systems:  N/A Cardiac Risk Factors include: advanced age (>47men, >45 women);obesity (BMI >30kg/m2);male gender;dyslipidemia     Objective:    Vitals: BP 120/76   Pulse 68   Temp 98 F (36.7 C) (Oral)   Ht 5' 9.5" (1.765 m)   Wt 203 lb 12 oz (92.4 kg)   SpO2 94%   BMI 29.66 kg/m   Body mass index is 29.66 kg/m.  Advanced Directives 01/28/2018 06/19/2017 06/10/2017 01/10/2017 01/25/2016 08/24/2015 07/27/2015  Does Patient Have a Medical Advance Directive? Yes Yes Yes Yes Yes Yes Yes  Type of Paramedic of [place];Living will [place];Living will;Out of facility DNR (pink MOST or yellow form) [place];Living will;Out of facility DNR (pink MOST or yellow form) [place];Living will [place];Living will Living will;Healthcare Power of Attorney -  Does patient want to make changes to medical advance directive? - No - Patient declined No - Patient declined - No - Patient declined No - Patient declined -  Copy of [place] in Chart? No - copy requested No - copy requested No - copy requested No - copy requested - No - copy requested -    Tobacco Social History   Tobacco Use  Smoking Status Former Smoker  . Packs/day: 0.50  . Years: 38.00  . Pack years: 19.00  . Types: Cigarettes  . Last attempt to quit: 07/13/1997  . Years since quitting: 20.5  Smokeless Tobacco Never Used     Counseling given: No   Clinical Intake:  Pre-visit preparation completed: Yes  Pain : No/denies pain Pain Score: 0-No pain     Nutritional Status: BMI 25 -29 Overweight Nutritional Risks: None Diabetes: No  What is the last grade level you completed in school?: Associates degree  Interpreter Needed?: No  Comments: pt lives with spouse Information  entered by :: LPinson, LPN  Past Medical History:  Diagnosis Date  . Arthritis    fingers  . 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 Chicken pox   . Dysrhythmia 09/2013   Hx. a-fib x 1 episode patient states he "auto corrected" seen at [facility]  . GERD (gastroesophageal reflux disease)   . Headache    poor posture - none recently  . PSVT (paroxysmal supraventricular tachycardia) ([place]) 2009   Controlled with "breathing process"  . Renal stone 9/08, 6/15  . Wears dentures    full upper   Past Surgical History:  Procedure Laterality Date  . CARDIAC CATHETERIZATION  [address] [place]  . CATARACT EXTRACTION W/PHACO Left 07/27/2015   Procedure: CATARACT EXTRACTION PHACO AND INTRAOCULAR LENS PLACEMENT (IOC);  Surgeon: Leandrew Koyanagi, MD;  Location: [facility];  Service: Ophthalmology;  Laterality: Left;  TORIC  . CATARACT EXTRACTION W/PHACO Right 08/24/2015   Procedure: CATARACT EXTRACTION PHACO AND INTRAOCULAR LENS PLACEMENT (IOC);  Surgeon: Leandrew Koyanagi, MD;  Location: [facility];  Service: Ophthalmology;  Laterality: Right;  TORIC  . HEMORROIDECTOMY  2010   banding  . KIDNEY STONE SURGERY  9/08, 6/15   lithotripsy  . PROSTATE BIOPSY  2007  . SKIN CANCER EXCISION    . TOENAIL TRIMMING  11/2017   Dr. Elvina Mattes  . VASECTOMY     Family History  Problem Relation Age of Onset  . AAA (abdominal  aortic aneurysm) Father   . Parkinson's disease Mother   . Arthritis Mother    Social History   Socioeconomic History  . Marital status: Married    Spouse name: Not on file  . Number of children: Not on file  . Years of education: Not on file  . Highest education level: Not on file  Occupational History  . Not on file  Social Needs  . Financial resource strain: Not on file  . Food insecurity:    Worry: Not on file    Inability: Not on file  . Transportation needs:    Medical: Not on file    Non-medical: Not on file  Tobacco Use   . Smoking status: Former Smoker    Packs/day: 0.50    Years: 38.00    Pack years: 19.00    Types: Cigarettes    Last attempt to quit: 07/13/1997    Years since quitting: 20.5  . Smokeless tobacco: Never Used  Substance and Sexual Activity  . Alcohol use: No  . Drug use: No  . Sexual activity: Not on file  Lifestyle  . Physical activity:    Days per week: Not on file    Minutes per session: Not on file  . Stress: Not on file  Relationships  . Social connections:    Talks on phone: Not on file    Gets together: Not on file    Attends religious service: Not on file    Active member of club or organization: Not on file    Attends meetings of clubs or organizations: Not on file    Relationship status: Not on file  Other Topics Concern  . Not on file  Social History Narrative  . Not on file    Outpatient Encounter Medications as of 01/28/2018  Medication Sig  . Coenzyme Q10 (COQ10) 200 MG CAPS Take 200 mg by mouth daily.  Marland Kitchen diltiazem (CARDIZEM) 30 MG tablet Take 1 tablet (30 mg total) by mouth 3 (three) times daily as needed.  Marland Kitchen EPINEPHrine 0.3 mg/0.3 mL IJ SOAJ injection Inject 0.3 mLs (0.3 mg total) into the muscle once for 1 dose.  . Magnesium 500 MG CAPS Take 500 mg by mouth daily.  . metoprolol succinate (TOPROL-XL) 25 MG 24 hr tablet TAKE 1 TABLET DAILY WITH OR IMMEDIATELY FOLLOWING A MEAL  . Misc Natural Products (GLUCOSAMINE CHONDROITIN ADV PO) Take 1 tablet by mouth daily.  . Multiple Vitamins-Minerals (PRESERVISION AREDS 2 PO) Take by mouth 2 (two) times daily.  . Nutritional Supplements (DHEA PO) Take by mouth.  Marland Kitchen omeprazole (PRILOSEC) 20 MG capsule Take 1 capsule (20 mg total) by mouth daily.  Vladimir Faster Glycol-Propyl Glycol (SYSTANE OP) Apply 1 drop to eye 3 (three) times daily.  . Probiotic Product (PROBIOTIC DAILY PO) Take by mouth.  Alveda Reasons 20 MG TABS tablet TAKE 1 TABLET DAILY WITH SUPPER   No facility-administered encounter medications on file as of  01/28/2018.     Activities of Daily Living In your present state of health, do you have any difficulty performing the following activities: 01/28/2018  Hearing? N  Vision? N  Difficulty concentrating or making decisions? Y  Walking or climbing stairs? N  Dressing or bathing? N  Doing errands, shopping? N  Preparing Food and eating ? N  Using the Toilet? N  In the past six months, have you accidently leaked urine? N  Do you have problems with loss of bowel control? N  Managing your Medications?  N  Managing your Finances? N  Housekeeping or managing your Housekeeping? N  Some recent data might be hidden    Patient Care Team: Pleas Koch, NP as PCP - General (Internal Medicine) Minna Merritts, MD as Consulting Physician (Cardiology) Leandrew Koyanagi, MD as Referring Physician (Ophthalmology)   Assessment:   This is a routine wellness examination for [employer].   Hearing Screening   125Hz  250Hz  500Hz  1000Hz  2000Hz  3000Hz  4000Hz  6000Hz  8000Hz   Right ear:   40 40 40  40    Left ear:   40 40 40  40    Vision Screening Comments: Vision exam in May 2019 with Dr. Wallace Going     Exercise Activities and Dietary recommendations Current Exercise Habits: The patient does not participate in regular exercise at present, Exercise limited by: None identified  Goals    . DIET - INCREASE WATER INTAKE     Starting 01/28/2018, I will attempt to drink at least 4 glasses of water daily.        Fall Risk Fall Risk  01/28/2018 01/10/2017 03/14/2016  Falls in the past year? Yes Yes No  Comment pt tripped and fell onto left shoulder; urgent care treatment - -  Number falls in past yr: 1 2 or more -  Injury with Fall? Yes Yes -    Depression Screen PHQ 2/9 Scores 01/28/2018 01/10/2017 03/14/2016  PHQ - 2 Score 0 0 0  PHQ- 9 Score 0 1 -    Cognitive Function MMSE - Mini Mental State Exam 01/28/2018 01/10/2017  Orientation to time 5 5  Orientation to Place 5 5  Registration 3 3    Attention/ Calculation 0 0  Recall 3 3  Language- name 2 objects 0 0  Language- repeat 1 1  Language- follow 3 step command 3 3  Language- read & follow direction 0 0  Write a sentence 0 0  Copy design 0 0  Total score 20 20       PLEASE NOTE: A Mini-Cog screen was completed. Maximum score is 20. A value of 0 denotes this part of [place] MMSE was not completed or the patient failed this part of the Mini-Cog screening.   Mini-Cog Screening Orientation to Time - Max 5 pts Orientation to Place - Max 5 pts Registration - Max 3 pts Recall - Max 3 pts Language Repeat - Max 1 pts Language Follow 3 Step Command - Max 3 pts   Immunization History  Administered Date(s) Administered  . Influenza,inj,Quad PF,6+ Mos 04/12/2017, 01/28/2018  . Pneumococcal Conjugate-13 01/10/2017  . Pneumococcal Polysaccharide-23 08/13/2010  . Tdap 06/13/2011  . Zoster 09/01/2010    Screening Tests Health Maintenance  Topic Date Due  . TETANUS/TDAP  06/12/2021  . INFLUENZA VACCINE  Completed  . PNA vac Low Risk Adult  Completed       Plan:     I have personally reviewed, addressed, and noted the following in the patient's chart:  A. Medical and social history B. Use of alcohol, tobacco or illicit drugs  C. Current medications and supplements D. Functional ability and status E.  Nutritional status F.  Physical activity G. Advance directives H. List of other physicians I.  Hospitalizations, surgeries, and ER visits in previous 12 months J.  [place] to include hearing, vision, cognitive, depression L. Referrals and appointments - none  In addition, I have reviewed and discussed with patient certain preventive protocols, quality metrics, and best practice recommendations. A written personalized care  plan for preventive services as well as general preventive health recommendations were provided to patient.  See attached scanned questionnaire for additional information.   Signed,    Lindell Noe, MHA, BS, LPN Health Coach

## 2018-01-28 NOTE — Assessment & Plan Note (Signed)
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 No recent use of Epipen.

## 2018-01-28 NOTE — Progress Notes (Signed)
PCP notes:   Health maintenance:  No gaps identified.   Abnormal screenings:   Fall risk - hx of single fall Fall Risk  01/28/2018 01/10/2017 03/14/2016  Falls in the past year? Yes Yes No  Comment pt tripped and fell onto left shoulder; urgent care treatment - -  Number falls in past yr: 1 2 or more -  Injury with Fall? Yes Yes -    Patient concerns:   None  Nurse concerns:  None  Next PCP appt:   N/A; CPE prior to AWV

## 2018-01-28 NOTE — Progress Notes (Signed)
Subjective:    Patient ID: Adam Juarez, male    DOB: [birth year], 77 y.o.   MRN: [id number]  HPI  Mr. Lipke is a 77 year old male who presents today for [place] Part 2. He will see our health advisor later today. He is due for his influenza vaccination.   1) Prediabetes: Recent A1C of 5.7 which has decreased overall since August 2018 from 5.9. He is active in diabetes nutrition classes once monthly and feels this has helped.   Diet currently consists of:  Breakfast: Cereal, bacon, egg Lunch: Sandwich Dinner: Meat, some vegetables, starch Snacks: Popcorn, pretzels  Desserts: Sweet popcorn, candy, cakes. 4 times weekly  Beverages: Water, Coke, juice  Exercise: He is not exercising  2) Hyperlipidemia: Currently managed on Coenzyme Q10 200 mg. Recent lipid panel with TC of 167, Trigs of 359, LDL of 76. He is not exercising. He cannot take Fish Oil due to already taking Xarelto. 20 mg.   3) Atrial Fibrillation/PVC's/SVT/AAA: Currently managed on Xarelto 20 mg daily, diltiazem 30 mg TID, metoprolol succinate 25 mg. He doesn't take diltiazem regularly as this is a "rescue" pill. Following with cardiology. Due for repeat imaging of AAA.   4) GERD: Currently managed on omeprazole daily and is doing well. He does have breakthrough symptoms occasionally and takes Tums 1-2 times weekly on average.    5) Elevated PSA: Nocturia once nightly, also with difficulty urinating during the night. He denies hematuria, daytime frequency, difficulty urinating during the day. PSA of 6.54 in April 2019, PSA of 5.26 in September 2019. History of prostate biopsy years ago.  Review of Systems  Constitutional: Negative for unexpected weight change.  Respiratory: Negative for cough and shortness of breath.   Cardiovascular: Negative for chest pain.  Gastrointestinal: Negative for constipation and diarrhea.  Genitourinary:       See HPI  Skin: Negative for rash.  Neurological: Negative for dizziness, numbness  and headaches.       Past Medical History:  Diagnosis Date  . Arthritis    fingers  . 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 Chicken pox   . Dysrhythmia 09/2013   Hx. a-fib x 1 episode patient states he "auto corrected" seen at [facility]  . GERD (gastroesophageal reflux disease)   . Headache    poor posture - none recently  . PSVT (paroxysmal supraventricular tachycardia) ([place]) 2009   Controlled with "breathing process"  . Renal stone 9/08, 6/15  . Wears dentures    full upper     Social History   Socioeconomic History  . Marital status: Married    Spouse name: Not on file  . Number of children: Not on file  . Years of education: Not on file  . Highest education level: Not on file  Occupational History  . Not on file  Social Needs  . Financial resource strain: Not on file  . Food insecurity:    Worry: Not on file    Inability: Not on file  . Transportation needs:    Medical: Not on file    Non-medical: Not on file  Tobacco Use  . Smoking status: Former Smoker    Packs/day: 0.50    Years: 38.00    Pack years: 19.00    Types: Cigarettes    Last attempt to quit: 07/13/1997    Years since quitting: 20.5  . Smokeless tobacco: Never Used  Substance and Sexual Activity  . Alcohol use: No  .  Drug use: No  . Sexual activity: Not on file  Lifestyle  . Physical activity:    Days per week: Not on file    Minutes per session: Not on file  . Stress: Not on file  Relationships  . Social connections:    Talks on phone: Not on file    Gets together: Not on file    Attends religious service: Not on file    Active member of club or organization: Not on file    Attends meetings of clubs or organizations: Not on file    Relationship status: Not on file  . Intimate partner violence:    Fear of current or ex partner: Not on file    Emotionally abused: Not on file    Physically abused: Not on file    Forced sexual activity: Not on file  Other Topics  Concern  . Not on file  Social History Narrative  . Not on file    Past Surgical History:  Procedure Laterality Date  . CARDIAC CATHETERIZATION  [address], [place]  . CATARACT EXTRACTION W/PHACO Left 07/27/2015   Procedure: CATARACT EXTRACTION PHACO AND INTRAOCULAR LENS PLACEMENT (IOC);  Surgeon: Leandrew Koyanagi, MD;  Location: [facility];  Service: Ophthalmology;  Laterality: Left;  TORIC  . CATARACT EXTRACTION W/PHACO Right 08/24/2015   Procedure: CATARACT EXTRACTION PHACO AND INTRAOCULAR LENS PLACEMENT (IOC);  Surgeon: Leandrew Koyanagi, MD;  Location: [facility];  Service: Ophthalmology;  Laterality: Right;  TORIC  . HEMORROIDECTOMY  2010   banding  . KIDNEY STONE SURGERY  9/08, 6/15   lithotripsy  . PROSTATE BIOPSY  2007  . SKIN CANCER EXCISION    . TOENAIL TRIMMING  11/2017   Dr. Elvina Mattes  . VASECTOMY      Family History  Problem Relation Age of Onset  . AAA (abdominal aortic aneurysm) Father   . Parkinson's disease Mother   . Arthritis Mother     Allergies  Allergen Reactions  . Other Anaphylaxis and Hives    BEE STINGS BEE STINGS BEE STINGS    Current Outpatient Medications on File Prior to Visit  Medication Sig Dispense Refill  . Coenzyme Q10 (COQ10) 200 MG CAPS Take 200 mg by mouth daily.    . Magnesium 500 MG CAPS Take 500 mg by mouth daily.    . metoprolol succinate (TOPROL-XL) 25 MG 24 hr tablet TAKE 1 TABLET DAILY WITH OR IMMEDIATELY FOLLOWING A MEAL 90 tablet 3  . Misc Natural Products (GLUCOSAMINE CHONDROITIN ADV PO) Take 1 tablet by mouth daily.    . Multiple Vitamins-Minerals (PRESERVISION AREDS 2 PO) Take by mouth 2 (two) times daily.    . Nutritional Supplements (DHEA PO) Take by mouth.    Marland Kitchen omeprazole (PRILOSEC) 20 MG capsule Take 1 capsule (20 mg total) by mouth daily. 90 capsule 3  . Polyethyl Glycol-Propyl Glycol (SYSTANE OP) Apply 1 drop to eye 3 (three) times daily.    . Probiotic Product (PROBIOTIC DAILY PO) Take  by mouth.    Alveda Reasons 20 MG TABS tablet TAKE 1 TABLET DAILY WITH SUPPER 90 tablet 3  . diltiazem (CARDIZEM) 30 MG tablet Take 1 tablet (30 mg total) by mouth 3 (three) times daily as needed. 90 tablet 3   No current facility-administered medications on file prior to visit.     BP 120/76   Pulse 68   Temp 98 F (36.7 C) (Oral)   Ht 5' 9.5" (1.765 m)   Wt  203 lb 12 oz (92.4 kg)   SpO2 94%   BMI 29.66 kg/m    Objective:   Physical Exam  Constitutional: He is oriented to person, place, and time. He appears well-nourished.  HENT:  Mouth/Throat: No oropharyngeal exudate.  Eyes: Pupils are equal, round, and reactive to light. EOM are normal.  Neck: Neck supple. No thyromegaly present.  Cardiovascular: Normal rate and regular rhythm.  Respiratory: Effort normal and breath sounds normal.  GI: Soft. Bowel sounds are normal. There is no tenderness.  Musculoskeletal: Normal range of motion.  Neurological: He is alert and oriented to person, place, and time.  Skin: Skin is warm and dry.  Psychiatric: He has a normal mood and affect.           Assessment & Plan:

## 2018-01-28 NOTE — Assessment & Plan Note (Signed)
Recent lipids with elevation in chronically elevated trigs.  Strongly advised he work on regular exercise as tolerated. No Fish Oil as he's on Xarelto. Commended him on dietary changes, encouraged him to continue.  Will continue to monitor.

## 2018-01-28 NOTE — Patient Instructions (Signed)
Adam Juarez , Thank you for taking time to come for your Medicare Wellness Visit. I appreciate your ongoing commitment to your health goals. Please review the following plan we discussed and let me know if I can assist you in the future.   These are the goals we discussed: Goals    . DIET - INCREASE WATER INTAKE     Starting 01/28/2018, I will attempt to drink at least 4 glasses of water daily.        This is a list of the screening recommended for you and due dates:  Health Maintenance  Topic Date Due  . Tetanus Vaccine  06/12/2021  . Flu Shot  Completed  . Pneumonia vaccines  Completed   Preventive Care for Adults  A healthy lifestyle and preventive care can promote health and wellness. Preventive health guidelines for adults include the following key practices.  . A routine yearly physical is a good way to check with your health care provider about your health and preventive screening. It is a chance to share any concerns and updates on your health and to receive a thorough exam.  . Visit your dentist for a routine exam and preventive care every 6 months. Brush your teeth twice a day and floss once a day. Good oral hygiene prevents tooth decay and gum disease.  . The frequency of eye exams is based on your age, health, family medical history, use  of contact lenses, and other factors. Follow your health care provider's recommendations for frequency of eye exams.  . Eat a healthy diet. Foods like vegetables, fruits, whole grains, low-fat dairy products, and lean protein foods contain the nutrients you need without too many calories. Decrease your intake of foods high in solid fats, added sugars, and salt. Eat the right amount of calories for you. Get information about a proper diet from your health care provider, if necessary.  . Regular physical exercise is one of the most important things you can do for your health. Most adults should get at least 150 minutes of moderate-intensity  exercise (any activity that increases your heart rate and causes you to sweat) each week. In addition, most adults need muscle-strengthening exercises on 2 or more days a week.  Silver Sneakers may be a benefit available to you. To determine eligibility, you may visit the website: www.silversneakers.com or contact program at 712 256 8991 Mon-Fri between 8AM-8PM.   . Maintain a healthy weight. The body mass index (BMI) is a screening tool to identify possible weight problems. It provides an estimate of body fat based on height and weight. Your health care provider can find your BMI and can help you achieve or maintain a healthy weight.   For adults 20 years and older: ? A BMI below 18.5 is considered underweight. ? A BMI of 18.5 to 24.9 is normal. ? A BMI of 25 to 29.9 is considered overweight. ? A BMI of 30 and above is considered obese.   . Maintain normal blood lipids and cholesterol levels by exercising and minimizing your intake of saturated fat. Eat a balanced diet with plenty of fruit and vegetables. Blood tests for lipids and cholesterol should begin at age 33 and be repeated every 5 years. If your lipid or cholesterol levels are high, you are over 50, or you are at high risk for heart disease, you may need your cholesterol levels checked more frequently. Ongoing high lipid and cholesterol levels should be treated with medicines if diet and exercise are not  working.  . If you smoke, find out from your health care provider how to quit. If you do not use tobacco, please do not start.  . If you choose to drink alcohol, please do not consume more than 2 drinks per day. One drink is considered to be 12 ounces (355 mL) of beer, 5 ounces (148 mL) of wine, or 1.5 ounces (44 mL) of liquor.  . If you are 30-86 years old, ask your health care provider if you should take aspirin to prevent strokes.  . Use sunscreen. Apply sunscreen liberally and repeatedly throughout the day. You should seek shade  when your shadow is shorter than you. Protect yourself by wearing long sleeves, pants, a wide-brimmed hat, and sunglasses year round, whenever you are outdoors.  . Once a month, do a whole body skin exam, using a mirror to look at the skin on your back. Tell your health care provider of new moles, moles that have irregular borders, moles that are larger than a pencil eraser, or moles that have changed in shape or color.

## 2018-01-28 NOTE — Assessment & Plan Note (Signed)
Following with Cardiology. Continue with Xarelto and Toprol. Rate and rhythm regular today.

## 2018-01-28 NOTE — Patient Instructions (Signed)
Start exercising. You should be getting 150 minutes of exercise weekly.  Continue to work on [employer], great job so far!  You will be contacted regarding your abdominal ultrasound imaging.  Please let us know if you have not been contacted within one week.   Follow up with Dr. Rockey Situ as discussed.   Schedule a lab only appointment in 6 months to repeat your A1C and PSA.  We'll see you in one year for your annual exam or sooner if needed.  It was a pleasure to see you today!

## 2018-01-28 NOTE — Assessment & Plan Note (Signed)
Due for repeat imaging which has been ordered and is pending. Last CT image with an increase in size from 3.3 to 4.4 cm. Await results.

## 2018-01-28 NOTE — Assessment & Plan Note (Signed)
Underwent prostate biopsy years ago. 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 Continue to monitor, repeat in 6 months.

## 2018-02-01 NOTE — Progress Notes (Signed)
I reviewed health advisor's note, was available for consultation, and agree with documentation and plan.  

## 2018-02-14 ENCOUNTER — Encounter: Payer: Self-pay | Admitting: Registered"

## 2018-02-14 ENCOUNTER — Encounter: Payer: Medicare Other | Attending: Primary Care | Admitting: Registered"

## 2018-02-14 DIAGNOSIS — R7303 Prediabetes: Secondary | ICD-10-CM

## 2018-02-14 NOTE — Progress Notes (Signed)
Patient was seen on 02/14/18 for the Diabetes Prevention Program course at [facility]. By the end of this session patients are able to complete the following objectives:   Learning Objectives:  Describe the differences between unsaturated, saturated, and trans fat on heart health.   List dietary sources of unsaturated, saturated, and trans fats.  Explain ways to reduce intake of saturated fat and replace them with heart healthy fats.  Goals:   Record weight taken outside of class.   Track foods and beverages eaten each day in the "Food and Activity Tracker," including calories and fat grams for each item.    Track activity type, minutes you were active, and distance you reached each day in the "Food and Activity Tracker."   Follow-Up Plan:  Attend next session.   Bring completed "Food and Activity Trackers" to next session to be reviewed by Lifestyle Coach.

## 2018-02-24 ENCOUNTER — Ambulatory Visit (INDEPENDENT_AMBULATORY_CARE_PROVIDER_SITE_OTHER): Payer: Medicare Other

## 2018-02-24 DIAGNOSIS — I714 Abdominal aortic aneurysm, without rupture, unspecified: Secondary | ICD-10-CM

## 2018-03-13 ENCOUNTER — Ambulatory Visit (INDEPENDENT_AMBULATORY_CARE_PROVIDER_SITE_OTHER): Payer: Medicare Other | Admitting: Primary Care

## 2018-03-13 ENCOUNTER — Encounter: Payer: Self-pay | Admitting: Primary Care

## 2018-03-13 VITALS — BP 124/70 | HR 64 | Temp 98.0°F | Ht 69.5 in | Wt 208.5 lb

## 2018-03-13 DIAGNOSIS — M79673 Pain in unspecified foot: Secondary | ICD-10-CM | POA: Diagnosis not present

## 2018-03-13 DIAGNOSIS — M25512 Pain in left shoulder: Secondary | ICD-10-CM | POA: Diagnosis not present

## 2018-03-13 DIAGNOSIS — R232 Flushing: Secondary | ICD-10-CM | POA: Diagnosis not present

## 2018-03-13 DIAGNOSIS — G8929 Other chronic pain: Secondary | ICD-10-CM | POA: Diagnosis not present

## 2018-03-13 DIAGNOSIS — R6889 Other general symptoms and signs: Secondary | ICD-10-CM

## 2018-03-13 DIAGNOSIS — R202 Paresthesia of skin: Secondary | ICD-10-CM | POA: Diagnosis not present

## 2018-03-13 LAB — TSH: TSH: 2.15 u[IU]/mL (ref 0.35–4.50)

## 2018-03-13 LAB — CBC
HCT: 43.7 % (ref 39.0–52.0)
Hemoglobin: 15.1 g/dL (ref 13.0–17.0)
MCHC: 34.5 g/dL (ref 30.0–36.0)
MCV: 97.3 fl (ref 78.0–100.0)
Platelets: 150 10*3/uL (ref 150.0–400.0)
RBC: 4.5 Mil/uL (ref 4.22–5.81)
RDW: 13.2 % (ref 11.5–15.5)
WBC: 4.3 10*3/uL (ref 4.0–10.5)

## 2018-03-13 LAB — T4, FREE: Free T4: 0.8 ng/dL (ref 0.60–1.60)

## 2018-03-13 LAB — VITAMIN B12: VITAMIN B 12: 243 pg/mL (ref 211–911)

## 2018-03-13 NOTE — Patient Instructions (Signed)
Stop by the lab prior to leaving today. I will notify you of your results once received.   Stop by the front desk and speak with either Rosaria Ferries or De[place] regarding your MRI.  Consider using topical agents to the feet as discussed. Continue to wear comfortable and supportive shoes.   It was a pleasure to see you today!

## 2018-03-13 NOTE — Progress Notes (Signed)
Subjective:    Patient ID: Adam Juarez, male    DOB: 05-17-1940, 77 y.o.   MRN: [id number]  HPI  Mr. Wight is a 77 year old male with a history of paroxysmal atrial fibrillation, SVT, AAA, hyperlipidemia, chronic fatigue, headaches, chronic shoulder pain who presents today with a chief complaint of multiple complaints.  1) Chronic Shoulder Pain: Located to the left shoulder since January 2019 after a fall. He underwent physical therapy in early 2019 for 9 weeks, felt better but without resolve.   Since his injury he's never felt completely healed. He continues to notice decrease in strength, can only hold 5 pounds maximum before he experiences pain to his left shoulder. Over the Summer this year he's noticed that his symptoms began to progress. He does have intermittent numbness to the left upper extremity, his pain is isolated to the left shoulder.   2) Chronic Foot Pain: Located to bilateral plantar feet. He describes his pain as sharp, tingling, numb. This has been chronic since January 2019. He's always used Dr. Zoe Lan inserts in his shoes. He did change his shoes three days ago and noticed improvement. He rolls his feet with tennis balls and dumb bells at home. He denies recent injury/trauma.   He's also reporting some vague symptoms of hot flashes, warm hands. TSH and B12 were normal in April 2019.  Review of Systems  Endocrine: Positive for heat intolerance.  Musculoskeletal:       Chronic left shoulder and bilateral plantar foot pain  Skin: Negative for color change.  Neurological: Positive for weakness and numbness.       Past Medical History:  Diagnosis Date  . Arthritis    fingers  . 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 Chicken pox   . Dysrhythmia 09/2013   Hx. a-fib x 1 episode patient states he "auto corrected" seen at [facility]  . GERD (gastroesophageal reflux disease)   . Headache    poor posture - none recently  . PSVT (paroxysmal  supraventricular tachycardia) ([place]) 2009   Controlled with "breathing process"  . Renal stone 9/08, 6/15  . Wears dentures    full upper     Social History   Socioeconomic History  . Marital status: Married    Spouse name: Not on file  . Number of children: Not on file  . Years of education: Not on file  . Highest education level: Not on file  Occupational History  . Not on file  Social Needs  . Financial resource strain: Not on file  . Food insecurity:    Worry: Not on file    Inability: Not on file  . Transportation needs:    Medical: Not on file    Non-medical: Not on file  Tobacco Use  . Smoking status: Former Smoker    Packs/day: 0.50    Years: 38.00    Pack years: 19.00    Types: Cigarettes    Last attempt to quit: 07/13/1997    Years since quitting: 20.6  . Smokeless tobacco: Never Used  Substance and Sexual Activity  . Alcohol use: No  . Drug use: No  . Sexual activity: Not on file  Lifestyle  . Physical activity:    Days per week: Not on file    Minutes per session: Not on file  . Stress: Not on file  Relationships  . Social connections:    Talks on phone: Not on file    Gets  together: Not on file    Attends religious service: Not on file    Active member of club or organization: Not on file    Attends meetings of clubs or organizations: Not on file    Relationship status: Not on file  . Intimate partner violence:    Fear of current or ex partner: Not on file    Emotionally abused: Not on file    Physically abused: Not on file    Forced sexual activity: Not on file  Other Topics Concern  . Not on file  Social History Narrative  . Not on file    Past Surgical History:  Procedure Laterality Date  . CARDIAC CATHETERIZATION  [address], [place]  . CATARACT EXTRACTION W/PHACO Left 07/27/2015   Procedure: CATARACT EXTRACTION PHACO AND INTRAOCULAR LENS PLACEMENT (IOC);  Surgeon: Leandrew Koyanagi, MD;  Location: [facility];  Service:  Ophthalmology;  Laterality: Left;  TORIC  . CATARACT EXTRACTION W/PHACO Right 08/24/2015   Procedure: CATARACT EXTRACTION PHACO AND INTRAOCULAR LENS PLACEMENT (IOC);  Surgeon: Leandrew Koyanagi, MD;  Location: [facility];  Service: Ophthalmology;  Laterality: Right;  TORIC  . HEMORROIDECTOMY  2010   banding  . KIDNEY STONE SURGERY  9/08, 6/15   lithotripsy  . PROSTATE BIOPSY  2007  . SKIN CANCER EXCISION    . TOENAIL TRIMMING  11/2017   Dr. Elvina Mattes  . VASECTOMY      Family History  Problem Relation Age of Onset  . AAA (abdominal aortic aneurysm) Father   . Parkinson's disease Mother   . Arthritis Mother     Allergies  Allergen Reactions  . Other Anaphylaxis and Hives    BEE STINGS BEE STINGS BEE STINGS    Current Outpatient Medications on File Prior to Visit  Medication Sig Dispense Refill  . Coenzyme Q10 (COQ10) 200 MG CAPS Take 200 mg by mouth daily.    Marland Kitchen diltiazem (CARDIZEM) 30 MG tablet Take 1 tablet (30 mg total) by mouth 3 (three) times daily as needed. 90 tablet 3  . Magnesium 500 MG CAPS Take 500 mg by mouth daily.    . metoprolol succinate (TOPROL-XL) 25 MG 24 hr tablet TAKE 1 TABLET DAILY WITH OR IMMEDIATELY FOLLOWING A MEAL 90 tablet 3  . Misc Natural Products (GLUCOSAMINE CHONDROITIN ADV PO) Take 1 tablet by mouth daily.    . Multiple Vitamins-Minerals (PRESERVISION AREDS 2 PO) Take by mouth 2 (two) times daily.    . Nutritional Supplements (DHEA PO) Take by mouth.    Marland Kitchen omeprazole (PRILOSEC) 20 MG capsule Take 1 capsule (20 mg total) by mouth daily. 90 capsule 3  . Polyethyl Glycol-Propyl Glycol (SYSTANE OP) Apply 1 drop to eye 3 (three) times daily.    . Probiotic Product (PROBIOTIC DAILY PO) Take by mouth.    Marland Kitchen tiZANidine (ZANAFLEX) 4 MG tablet Take 4 mg by mouth at bedtime as needed and may repeat dose one time if needed for muscle spasms.    Alveda Reasons 20 MG TABS tablet TAKE 1 TABLET DAILY WITH SUPPER 90 tablet 3   No current facility-administered  medications on file prior to visit.     BP 124/70   Pulse 64   Temp 98 F (36.7 C) (Oral)   Ht 5' 9.5" (1.765 m)   Wt 208 lb 8 oz (94.6 kg)   SpO2 96%   BMI 30.35 kg/m    Objective:   Physical Exam  Constitutional: He appears well-nourished.  Cardiovascular:  Normal rate.  Respiratory: Effort normal.  Musculoskeletal:       Left shoulder: He exhibits decreased range of motion, pain and decreased strength. He exhibits no tenderness and no bony tenderness.  4/5 strength to left upper extremity. Decrease in ROM to left upper extremity with extension forward, posteriorly, and laterally.  Skin: Skin is warm and dry.           Assessment & Plan:

## 2018-03-13 NOTE — Assessment & Plan Note (Signed)
Since fall in January 2019. Exam today with suspicion for rotator cuff tear, likely mild. Given that he's had no resolve with conservative treatment, and the fact that his symptoms are worse, will obtain MRI for further evaluation.

## 2018-03-13 NOTE — Assessment & Plan Note (Signed)
To plantar feet bilaterally. Suspect more neuropathy. He is sedentary for most of his day. Check B12 for tingling. Recommended he continue to work on exercises to his feet and to wear supportive shoes.  He does have a podiatrist and will schedule an appointment if labs are unremarkable.

## 2018-03-14 ENCOUNTER — Encounter: Payer: Self-pay | Admitting: Registered"

## 2018-03-14 ENCOUNTER — Encounter: Payer: Medicare Other | Attending: Primary Care | Admitting: Registered"

## 2018-03-14 DIAGNOSIS — R7303 Prediabetes: Secondary | ICD-10-CM | POA: Diagnosis not present

## 2018-03-14 NOTE — Progress Notes (Signed)
Patient was seen on 03/14/18 for the Diabetes Prevention Program course at [facility]. By the end of this session patients are able to complete the following objectives:   Learning Objectives:  List 9 ways to to stay on track during special events/occasions.  Create a plan to avoid a food problem at an upcoming special event/occasion  Describe ways to make time for healthy behaviors during special events/occasions   Goals:   Record weight taken outside of class.   Track foods and beverages eaten each day in the "Food and Activity Tracker," including calories and fat grams for each item.    Track activity type, minutes you were active, and distance you reached each day in the "Food and Activity Tracker."   Follow-Up Plan:  Attend next session.   Bring completed "Food and Activity Trackers" to next session to be reviewed by Lifestyle Coach.

## 2018-03-22 ENCOUNTER — Ambulatory Visit
Admission: RE | Admit: 2018-03-22 | Discharge: 2018-03-22 | Disposition: A | Discharge status: Home / Self Care | Payer: Medicare Other | Source: Ambulatory Visit | Attending: Primary Care | Admitting: Primary Care

## 2018-03-22 DIAGNOSIS — M25512 Pain in left shoulder: Principal | ICD-10-CM

## 2018-03-22 DIAGNOSIS — G8929 Other chronic pain: Secondary | ICD-10-CM

## 2018-03-22 DIAGNOSIS — M75102 Unspecified rotator cuff tear or rupture of left shoulder, not specified as traumatic: Secondary | ICD-10-CM | POA: Diagnosis not present

## 2018-03-24 DIAGNOSIS — M25512 Pain in left shoulder: Principal | ICD-10-CM

## 2018-03-24 DIAGNOSIS — G8929 Other chronic pain: Secondary | ICD-10-CM

## 2018-03-25 DIAGNOSIS — H353132 Nonexudative age-related macular degeneration, bilateral, intermediate dry stage: Secondary | ICD-10-CM | POA: Diagnosis not present

## 2018-03-25 NOTE — Progress Notes (Signed)
,Cardiology Office Note  Date:  03/27/2018   ID:  Adam Juarez, DOB 13-Nov-1940, MRN [id number]  PCP:  Adam Koch, NP   Chief Complaint  Patient presents with  . other    12 month f/u no complaints today. Meds reviewed verbally with pt.    HPI:  Adam Juarez is a 77 year old gentleman with history of  smoking,  PAD,  Moderate aortic atherosclerosis of aorta and iliac vessels on CT scan in 2015 ,  paroxysmal atrial fibrillation,  prior Holter monitor August 2013 showing frequent PVCs,  frequent short runs of supraventricular tachycardia,  stress test at that time showing no ischemia September 2013,  Prior smoking history for 35-40 years who presents for follow-up of his paroxysmal atrial fibrillation  January 2019, fell injured his shoulder Scheduled to see orthopedics, does not know if he will need surgery  Questions concerning atrial fibrillation, does not appreciate tachycardia or arrhythmia Wonders if he needs to stay on blood thinners Tolerating anticoagulation CHADS VASC 3, calculation discussed with him  Triglycerides elevated Previously with lots of potato chips, no exercise program Not on a statin Previous total cholesterol 140, now in the 160s  EKG personally reviewed by myself on todays visit Shows normal sinus rhythm rate 71 bpm no significant ST or T wave changes  Other past medical history reviewed event monitor end of September for 30 days showing no significant atrial fibrillation.   CT scan abdomenJune 2015   Moderate diffuse descending aorta disease extending into the iliac vessels     tachycardia in April 2017 Monitors heart rate on his watch Numerous episodes of tachycardia in March 2017. last in November 2016.  He was admitted to the hospital for atrial fibrillation 08/08/2013, notes indicating he converted to normal sinus rhythm through his hospital course. He was not discharged on medications as he declined beta  blockers  echocardiogram August was 7 2013  showing normal LV function, mild LVH, a stock dysfunction, aortic valve sclerosis, normal right heart pressures  Holter monitor report August 2013 detailing 12,000 PVCs, 11% of total beat count. 55 supraventricular ectopies, no mention of the longest run   PMH:   has a past medical history of Arthritis, Cancer ([place]) (1991, 2011), Chicken pox, Dysrhythmia (09/2013), GERD (gastroesophageal reflux disease), Headache, PSVT (paroxysmal supraventricular tachycardia) ([place]) (2009), Renal stone (9/08, 6/15), and Wears dentures.  PSH:    Past Surgical History:  Procedure Laterality Date  . CARDIAC CATHETERIZATION  [address] [place]  . CATARACT EXTRACTION W/PHACO Left 07/27/2015   Procedure: CATARACT EXTRACTION PHACO AND INTRAOCULAR LENS PLACEMENT (IOC);  Surgeon: Leandrew Koyanagi, MD;  Location: [facility];  Service: Ophthalmology;  Laterality: Left;  TORIC  . CATARACT EXTRACTION W/PHACO Right 08/24/2015   Procedure: CATARACT EXTRACTION PHACO AND INTRAOCULAR LENS PLACEMENT (IOC);  Surgeon: Leandrew Koyanagi, MD;  Location: [facility];  Service: Ophthalmology;  Laterality: Right;  TORIC  . HEMORROIDECTOMY  2010   banding  . KIDNEY STONE SURGERY  9/08, 6/15   lithotripsy  . PROSTATE BIOPSY  2007  . SKIN CANCER EXCISION    . TOENAIL TRIMMING  11/2017   Dr. Elvina Mattes  . VASECTOMY      Current Outpatient Medications  Medication Sig Dispense Refill  . Coenzyme Q10 (COQ10) 200 MG CAPS Take 200 mg by mouth daily.    Marland Kitchen diltiazem (CARDIZEM) 30 MG tablet Take 1 tablet (30 mg total) by mouth 3 (three) times daily as needed. 90 tablet 3  .  Magnesium 500 MG CAPS Take 500 mg by mouth daily.    . metoprolol succinate (TOPROL-XL) 25 MG 24 hr tablet TAKE 1 TABLET DAILY WITH OR IMMEDIATELY FOLLOWING A MEAL 90 tablet 3  . Misc Natural Products (GLUCOSAMINE CHONDROITIN ADV PO) Take 1 tablet by mouth daily.    . Multiple Vitamins-Minerals  (PRESERVISION AREDS 2 PO) Take by mouth 2 (two) times daily.    . Nutritional Supplements (DHEA PO) Take by mouth.    Marland Kitchen omeprazole (PRILOSEC) 20 MG capsule Take 1 capsule (20 mg total) by mouth daily. 90 capsule 3  . Polyethyl Glycol-Propyl Glycol (SYSTANE OP) Apply 1 drop to eye 3 (three) times daily.    . Probiotic Product (PROBIOTIC DAILY PO) Take by mouth.    Marland Kitchen tiZANidine (ZANAFLEX) 4 MG tablet Take 4 mg by mouth at bedtime as needed and may repeat dose one time if needed for muscle spasms.    Alveda Reasons 20 MG TABS tablet TAKE 1 TABLET DAILY WITH SUPPER 90 tablet 3   No current facility-administered medications for this visit.      Allergies:   Other   Social History:  The patient  reports that he quit smoking about 20 years ago. His smoking use included cigarettes. He has a 19.00 pack-year smoking history. He has never used smokeless tobacco. He reports that he does not drink alcohol or use drugs.   Family History:   family history includes AAA (abdominal aortic aneurysm) in his father; Arthritis in his mother; Parkinson's disease in his mother.    Review of Systems: Review of Systems  Constitutional: Negative.   Respiratory: Negative.   Cardiovascular: Negative.        Tachycardia  Gastrointestinal: Negative.   Musculoskeletal: Negative.   Neurological: Negative.   Psychiatric/Behavioral: Negative.   All other systems reviewed and are negative.    PHYSICAL EXAM: VS:  BP 124/70 (BP Location: Left Arm, Patient Position: Sitting, Cuff Size: Normal)   Pulse 71   Ht 5\' 10"  (1.778 m)   Wt 204 lb 12 oz (92.9 kg)   BMI 29.38 kg/m  , BMI Body mass index is 29.38 kg/m. Constitutional:  oriented to person, place, and time. No distress.  HENT:  Head: Grossly normal Eyes:  no discharge. No scleral icterus.  Neck: No JVD, no carotid bruits  Cardiovascular: Regular rate and rhythm, no murmurs appreciated Pulmonary/Chest: Mildly decreased breath sounds throughout , no wheezes or  rails Abdominal: Soft.  no distension.  no tenderness.  Musculoskeletal: Normal range of motion Neurological:  normal muscle tone. Coordination normal. No atrophy Skin: Skin warm and dry Psychiatric: normal affect, pleasant  Recent Labs: 01/23/2018: ALT 14; BUN 25; Creatinine, Ser 0.94; Potassium 4.1; Sodium 140 03/13/2018: Hemoglobin 15.1; Platelets 150.0; TSH 2.15    Lipid Panel Lab Results  Component Value Date   CHOL 167 01/23/2018   HDL 31.20 (L) 01/23/2018   TRIG 359.0 (H) 01/23/2018      Wt Readings from Last 3 Encounters:  03/27/18 204 lb 12 oz (92.9 kg)  03/14/18 201 lb 1.6 oz (91.2 kg)  03/13/18 208 lb 8 oz (94.6 kg)      ASSESSMENT AND PLAN:  Paroxysmal atrial fibrillation ([place]) - Plan: EKG 12-Lead Chads vasc of  3 Tolerating anticoagulation.  Long discussion with him, recommended he stay on anticoagulation given his risk factors Continue metoprolol  Aortic atherosclerosis ([facility])   moderate disease seen on CT scan   long years of smoking We have offered statin or  Zetia He will think about this and let us know  Smoking history  smoking history, likely responsible for his aortic atherosclerosis No longer smoking  Mixed hyperlipidemia He is uncertain if he would like to treat his cholesterol Discussed benefits of aggressive lipid management  Chronic fatigue Recommended a regular exercise program  Less of an issue on today's visit  Abdominal aortic aneurysm (AAA) without rupture ([place]) Mildly dilated aorta seen on CT scan in 2015 , 3.3 cm  ultrasound result did not show much progression  No further work-up at this time  PAD Likely secondary to long-standing smoking. Denies any claudication symptoms Stressed the importance of aggressive lipid control    Total encounter time more than 25 minutes  Greater than 50% was spent in counseling and coordination of care with the patient   Disposition:   F/U  12 months   Orders Placed This Encounter   Procedures  . EKG 12-Lead     Signed, Esmond Plants, M.D., Ph.D. 03/27/2018  [place], [place]

## 2018-03-27 ENCOUNTER — Encounter: Payer: Self-pay | Admitting: Cardiovascular Disease

## 2018-03-27 ENCOUNTER — Ambulatory Visit (INDEPENDENT_AMBULATORY_CARE_PROVIDER_SITE_OTHER): Payer: Medicare Other | Admitting: Cardiovascular Disease

## 2018-03-27 VITALS — BP 124/70 | HR 71 | Ht 70.0 in | Wt 204.8 lb

## 2018-03-27 DIAGNOSIS — I480 Paroxysmal atrial fibrillation: Secondary | ICD-10-CM | POA: Diagnosis not present

## 2018-03-27 DIAGNOSIS — I739 Peripheral vascular disease, unspecified: Secondary | ICD-10-CM | POA: Insufficient documentation

## 2018-03-27 DIAGNOSIS — Z87891 Personal history of nicotine dependence: Secondary | ICD-10-CM

## 2018-03-27 DIAGNOSIS — I700 Atherosclerosis of aorta: Secondary | ICD-10-CM | POA: Insufficient documentation

## 2018-03-27 DIAGNOSIS — I471 Supraventricular tachycardia: Secondary | ICD-10-CM

## 2018-03-27 DIAGNOSIS — E782 Mixed hyperlipidemia: Secondary | ICD-10-CM | POA: Diagnosis not present

## 2018-03-27 NOTE — Patient Instructions (Addendum)
Talk with PMD, about tramadol/celebrex   Think about cholesterol medication: Zetia (nonstatin) Or a statin like lipitor/crestor  Medication Instructions:  No changes  If you need a refill on your cardiac medications before your next appointment, please call your pharmacy.    Lab work: No new labs needed   If you have labs (blood work) drawn today and your tests are completely normal, you will receive your results only by: Marland Kitchen MyChart Message (if you have MyChart) OR . A paper copy in the mail If you have any lab test that is abnormal or we need to change your treatment, we will call you to review the results.   Testing/Procedures: No new testing needed   Follow-Up: At [facility], you and your health needs are our priority.  As part of our continuing mission to provide you with exceptional heart care, we have created designated Provider Care Teams.  These Care Teams include your primary Cardiologist (physician) and Advanced Practice Providers (APPs -  Physician Assistants and Nurse Practitioners) who all work together to provide you with the care you need, when you need it.  . You will need a follow up appointment in 12 months .   Please call our office 2 months in advance to schedule this appointment.    . Providers on your designated Care Team:   . Murray Hodgkins, NP . Christell Faith, PA-C . Marrianne Mood, PA-C  Any Other Special Instructions Will Be Listed Below (If Applicable).  For educational health videos Log in to : [email or web address] Or : [email or web address], password : [email or web address]

## 2018-04-01 ENCOUNTER — Other Ambulatory Visit: Payer: Self-pay | Admitting: Cardiovascular Disease

## 2018-04-01 NOTE — Telephone Encounter (Signed)
Please review for refill.  

## 2018-04-02 DIAGNOSIS — M7542 Impingement syndrome of left shoulder: Secondary | ICD-10-CM | POA: Diagnosis not present

## 2018-04-02 DIAGNOSIS — S46012A Strain of muscle(s) and tendon(s) of the rotator cuff of left shoulder, initial encounter: Secondary | ICD-10-CM | POA: Diagnosis not present

## 2018-04-02 MED ORDER — RIVAROXABAN 20 MG PO TABS: ORAL_TABLET | ORAL | 1 refills | Status: DC

## 2018-04-02 MED ORDER — METOPROLOL SUCCINATE ER 25 MG PO TB24: ORAL_TABLET | ORAL | 4 refills | Status: DC

## 2018-04-02 NOTE — Addendum Note (Signed)
Addended by: Dede Query R on: 04/02/2018 07:18 AM   Modules accepted: Orders

## 2018-04-16 DIAGNOSIS — L821 Other seborrheic keratosis: Secondary | ICD-10-CM | POA: Diagnosis not present

## 2018-04-16 DIAGNOSIS — D2261 Melanocytic nevi of right upper limb, including shoulder: Secondary | ICD-10-CM | POA: Diagnosis not present

## 2018-04-16 DIAGNOSIS — D225 Melanocytic nevi of trunk: Secondary | ICD-10-CM | POA: Diagnosis not present

## 2018-04-16 DIAGNOSIS — X32XXXA Exposure to sunlight, initial encounter: Secondary | ICD-10-CM | POA: Diagnosis not present

## 2018-04-16 DIAGNOSIS — L570 Actinic keratosis: Secondary | ICD-10-CM | POA: Diagnosis not present

## 2018-04-16 DIAGNOSIS — D2271 Melanocytic nevi of right lower limb, including hip: Secondary | ICD-10-CM | POA: Diagnosis not present

## 2018-04-16 DIAGNOSIS — D2262 Melanocytic nevi of left upper limb, including shoulder: Secondary | ICD-10-CM | POA: Diagnosis not present

## 2018-04-16 DIAGNOSIS — Z85828 Personal history of other malignant neoplasm of skin: Secondary | ICD-10-CM | POA: Diagnosis not present

## 2018-04-16 DIAGNOSIS — Z08 Encounter for follow-up examination after completed treatment for malignant neoplasm: Secondary | ICD-10-CM | POA: Diagnosis not present

## 2018-04-16 DIAGNOSIS — D2272 Melanocytic nevi of left lower limb, including hip: Secondary | ICD-10-CM | POA: Diagnosis not present

## 2018-04-17 DIAGNOSIS — M25612 Stiffness of left shoulder, not elsewhere classified: Secondary | ICD-10-CM | POA: Diagnosis not present

## 2018-04-17 DIAGNOSIS — M25512 Pain in left shoulder: Secondary | ICD-10-CM | POA: Diagnosis not present

## 2018-04-18 ENCOUNTER — Encounter: Payer: Self-pay | Admitting: Registered"

## 2018-04-18 ENCOUNTER — Encounter: Payer: Medicare Other | Attending: Primary Care | Admitting: Registered"

## 2018-04-18 DIAGNOSIS — R7303 Prediabetes: Secondary | ICD-10-CM

## 2018-04-18 NOTE — Progress Notes (Signed)
Patient was seen on 04/18/18 for the Diabetes Prevention Program course at [facility]. By the end of this session patients are able to complete the following objectives:   Learning Objectives:  List risk factors for heart disease.   Define the difference between HDL and LDL cholesterol  List ways to reduce risk for heart disease.   Goals:   Record weight taken outside of class.   Track foods and beverages eaten each day in the "Food and Activity Tracker," including calories and fat grams for each item.    Track activity type, minutes you were active, and distance you reached each day in the "Food and Activity Tracker."   Follow-Up Plan:  Attend next session.   Bring completed "Food and Activity Trackers" to next session to be reviewed by Lifestyle Coach.

## 2018-04-22 DIAGNOSIS — M25512 Pain in left shoulder: Secondary | ICD-10-CM | POA: Diagnosis not present

## 2018-04-22 DIAGNOSIS — M25612 Stiffness of left shoulder, not elsewhere classified: Secondary | ICD-10-CM | POA: Diagnosis not present

## 2018-04-24 DIAGNOSIS — M25612 Stiffness of left shoulder, not elsewhere classified: Secondary | ICD-10-CM | POA: Diagnosis not present

## 2018-04-24 DIAGNOSIS — M25512 Pain in left shoulder: Secondary | ICD-10-CM | POA: Diagnosis not present

## 2018-04-29 DIAGNOSIS — M25512 Pain in left shoulder: Secondary | ICD-10-CM | POA: Diagnosis not present

## 2018-04-29 DIAGNOSIS — M25612 Stiffness of left shoulder, not elsewhere classified: Secondary | ICD-10-CM | POA: Diagnosis not present

## 2018-05-16 ENCOUNTER — Encounter: Payer: Self-pay | Admitting: Registered"

## 2018-05-16 ENCOUNTER — Encounter: Payer: Medicare Other | Attending: Primary Care | Admitting: Registered"

## 2018-05-16 DIAGNOSIS — R7303 Prediabetes: Secondary | ICD-10-CM

## 2018-05-16 NOTE — Progress Notes (Signed)
Patient was seen on 05/16/18 for the Diabetes Prevention Program course at [facility]. By the end of this session patients are able to complete the following objectives:   Learning Objectives:  Counter self-defeating thoughts with positive self-statements  Define assertiveness.   List examples of ways to practice assertiveness.   Goals:   Record weight taken outside of class.   Track foods and beverages eaten each day in the "Food and Activity Tracker," including calories and fat grams for each item.    Track activity type, minutes you were active, and distance you reached each day in the "Food and Activity Tracker."   Follow-Up Plan:  Attend next session.   Bring completed "Food and Activity Trackers" to next session to be reviewed by Lifestyle Coach.

## 2018-06-05 ENCOUNTER — Ambulatory Visit (INDEPENDENT_AMBULATORY_CARE_PROVIDER_SITE_OTHER): Payer: Medicare Other | Admitting: Family Medicine

## 2018-06-05 ENCOUNTER — Encounter (INDEPENDENT_AMBULATORY_CARE_PROVIDER_SITE_OTHER): Payer: Self-pay

## 2018-06-05 ENCOUNTER — Encounter: Payer: Self-pay | Admitting: Family Medicine

## 2018-06-05 VITALS — BP 142/78 | HR 61 | Temp 97.6°F | Ht 69.5 in | Wt 206.2 lb

## 2018-06-05 DIAGNOSIS — S39012A Strain of muscle, fascia and tendon of lower back, initial encounter: Secondary | ICD-10-CM | POA: Diagnosis not present

## 2018-06-05 DIAGNOSIS — L820 Inflamed seborrheic keratosis: Secondary | ICD-10-CM | POA: Diagnosis not present

## 2018-06-05 DIAGNOSIS — L538 Other specified erythematous conditions: Secondary | ICD-10-CM | POA: Diagnosis not present

## 2018-06-05 DIAGNOSIS — L298 Other pruritus: Secondary | ICD-10-CM | POA: Diagnosis not present

## 2018-06-05 MED ORDER — TIZANIDINE HCL 4 MG PO TABS: 4.0000 mg | ORAL_TABLET | Freq: Every evening | ORAL | 0 refills | Status: DC | PRN

## 2018-06-05 NOTE — Progress Notes (Signed)
Subjective:     Adam Juarez is a 78 y.o. male presenting for Back Pain (Lower back. Has had back trouble years ago. This flare up started this morning. No trauma or injury.)     Back Pain  This is a chronic problem. The current episode started yesterday. The problem occurs intermittently. The problem is unchanged. The pain is present in the lumbar spine. The quality of the pain is described as stabbing. The pain does not radiate. The symptoms are aggravated by coughing, position, standing and twisting. Associated symptoms include headaches. Pertinent negatives include no bladder incontinence, bowel incontinence, fever, leg pain, numbness, tingling or weakness. Risk factors include lack of exercise. He has tried bed rest, heat and muscle relaxant for the symptoms. The treatment provided mild relief.   Episodes occur every few months Typically treated with tylenol  Does endorse some numbness in the toes bilaterally  Review of Systems  Constitutional: Negative for fever.  Gastrointestinal: Negative for bowel incontinence.  Genitourinary: Negative for bladder incontinence.  Musculoskeletal: Positive for back pain.  Neurological: Positive for headaches. Negative for tingling, weakness and numbness.    06/04/2018: Mychart - recurring back pain. requesting referral and muscle relaxant  Social History   Tobacco Use  Smoking Status Former Smoker  . Packs/day: 0.50  . Years: 38.00  . Pack years: 19.00  . Types: Cigarettes  . Last attempt to quit: 07/13/1997  . Years since quitting: 20.9  Smokeless Tobacco Never Used        Objective:    BP Readings from Last 3 Encounters:  06/05/18 (!) 142/78  03/27/18 124/70  03/13/18 124/70   Wt Readings from Last 3 Encounters:  06/05/18 206 lb 4 oz (93.6 kg)  05/16/18 204 lb 3.2 oz (92.6 kg)  04/18/18 205 lb (93 kg)    BP (!) 142/78   Pulse 61   Temp 97.6 F (36.4 C)   Ht 5' 9.5" (1.765 m)   Wt 206 lb 4 oz (93.6 kg)   SpO2 96%    BMI 30.02 kg/m    Physical Exam Constitutional:      Appearance: Normal appearance. He is not ill-appearing or diaphoretic.  HENT:     Right Ear: External ear normal.     Left Ear: External ear normal.     Nose: Nose normal.  Eyes:     General: No scleral icterus.    Extraocular Movements: Extraocular movements intact.     Conjunctiva/sclera: Conjunctivae normal.  Neck:     Musculoskeletal: Neck supple.  Cardiovascular:     Rate and Rhythm: Normal rate.  Pulmonary:     Effort: Pulmonary effort is normal.  Musculoskeletal:     Comments: Back:  Inspection: no deformities Palpation: left Paraspinous Lumbar TTP, intermittent spinous lumbar ttp ROM: normal, though some pain with extension and rotation Strength: normal Straight leg raise - limited due to hamstring tightness   Skin:    General: Skin is warm and dry.  Neurological:     General: No focal deficit present.     Mental Status: He is alert. Mental status is at baseline.     Sensory: No sensory deficit.     Motor: No weakness.     Deep Tendon Reflexes: Reflexes normal.  Psychiatric:        Mood and Affect: Mood normal.           Assessment & Plan:   Problem List Items Addressed This Visit    None  Visit Diagnoses    Strain of lumbar region, initial encounter    -  Primary     Discussed with patient that the intermittent symptoms x 40 years seems most consistent with MSK related strain and given the current flare has been present for 1 day w/o concerning features on exam - no weakness or sensation deficit.   Did mention numbness of the toes which has been present for years and initially he did not include. Unclear if this is related.   Discussed that as the current symptoms have not been present for a while this is reassuring that it is likely MSK. Would recommend follow-up in a few weeks if not improved and could consider XR and MRI at that time.   Muscle relaxant at night refilled.    Return if  symptoms worsen or fail to improve.  Lesleigh Noe, MD

## 2018-06-05 NOTE — Patient Instructions (Signed)
Do your home exercises for back pain  Make sure you keep moving

## 2018-06-07 ENCOUNTER — Emergency Department: Payer: Medicare Other

## 2018-06-07 ENCOUNTER — Other Ambulatory Visit: Payer: Self-pay

## 2018-06-07 ENCOUNTER — Emergency Department
Admission: EM | Admit: 2018-06-07 | Discharge: 2018-06-07 | Disposition: A | Discharge status: Home / Self Care | Payer: Medicare Other | Attending: Emergency Medicine | Admitting: Emergency Medicine

## 2018-06-07 ENCOUNTER — Encounter: Payer: Self-pay | Admitting: Emergency Medicine

## 2018-06-07 DIAGNOSIS — M545 Low back pain, unspecified: Secondary | ICD-10-CM

## 2018-06-07 DIAGNOSIS — Z79899 Other long term (current) drug therapy: Secondary | ICD-10-CM | POA: Diagnosis not present

## 2018-06-07 DIAGNOSIS — M5489 Other dorsalgia: Secondary | ICD-10-CM | POA: Diagnosis not present

## 2018-06-07 DIAGNOSIS — Z87891 Personal history of nicotine dependence: Secondary | ICD-10-CM | POA: Diagnosis not present

## 2018-06-07 DIAGNOSIS — Z85828 Personal history of other malignant neoplasm of skin: Secondary | ICD-10-CM | POA: Diagnosis not present

## 2018-06-07 DIAGNOSIS — I10 Essential (primary) hypertension: Secondary | ICD-10-CM | POA: Diagnosis not present

## 2018-06-07 DIAGNOSIS — R0902 Hypoxemia: Secondary | ICD-10-CM | POA: Diagnosis not present

## 2018-06-07 MED ORDER — OXYCODONE HCL 5 MG PO TABS: 5.0000 mg | ORAL_TABLET | Freq: Three times a day (TID) | ORAL | 0 refills | Status: DC | PRN

## 2018-06-07 MED ORDER — DEXAMETHASONE SODIUM PHOSPHATE 10 MG/ML IJ SOLN
10.0000 mg | Freq: Once | INTRAMUSCULAR | Status: AC
  Administered 2018-06-07: 10 mg via INTRAVENOUS
  Filled 2018-06-07: qty 1

## 2018-06-07 MED ORDER — HYDROMORPHONE HCL 1 MG/ML IJ SOLN
1.0000 mg | Freq: Once | INTRAMUSCULAR | Status: AC
  Administered 2018-06-07: 1 mg via INTRAVENOUS
  Filled 2018-06-07: qty 1

## 2018-06-07 MED ORDER — ONDANSETRON 4 MG PO TBDP: 4.0000 mg | ORAL_TABLET | Freq: Three times a day (TID) | ORAL | 0 refills | Status: DC | PRN

## 2018-06-07 NOTE — ED Notes (Signed)
Pt ambulatory to toilet with 1 assist. Informed to pull red cord when finished.

## 2018-06-07 NOTE — ED Notes (Signed)
Wife out to nurse's station asking when the pt will be seen, informed her that the doctor is making his rounds to see patients that were here before him. Informed her that I am not sure how long it will be until the doctor makes his rounds.

## 2018-06-07 NOTE — ED Notes (Signed)
Pt returned from MRI °

## 2018-06-07 NOTE — ED Notes (Signed)
Pt talking to MRI on the phone.  

## 2018-06-07 NOTE — ED Notes (Signed)
Pt wife repeatedly coming out to nurses desk. Pt hitting call bell. Informed that MD has to prescribe pain medication. Asked pt if he wanted to be re-arrange in bed. Denies changing positions helping him.

## 2018-06-07 NOTE — ED Notes (Signed)
Pt assisted to the toilet, bed moved closer to the toilet for patient, pt states he needs to have BM, wife in room with him, instructed pt to pull red string when he was done for staff to assist him back in the bed.  Pt was instructed to not get up unassisted, wife and pt both verbally agreed to call for assistance.

## 2018-06-07 NOTE — ED Provider Notes (Signed)
[facility] Emergency Department Provider Note   ____________________________________________    I have reviewed the triage vital signs and the nursing notes.   HISTORY  Chief Complaint Back Pain     HPI Adam Juarez is a 78 y.o. male who presents with complaints of back pain.  Patient reports a history of chronic back pain however the last 3 days it is been worse and then last night it became unbearable.  He denies numbness tingling in the legs.  Normal strength in the legs.  No saddle anesthesia.  No incontinence.  No fevers or chills.  No IV drug abuse.  He took a muscle relaxer with little improvement.  No history of back surgery.  He is on blood thinners  Past Medical History:  Diagnosis Date  . Arthritis    fingers  . 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 Chicken pox   . Dysrhythmia 09/2013   Hx. a-fib x 1 episode patient states he "auto corrected" seen at [facility]  . GERD (gastroesophageal reflux disease)   . Headache    poor posture - none recently  . PSVT (paroxysmal supraventricular tachycardia) ([place]) 2009   Controlled with "breathing process"  . Renal stone 9/08, 6/15  . Wears dentures    full upper    Patient Active Problem List   Diagnosis Date Noted  . Aortic atherosclerosis ([place]) 03/27/2018  . PAD (peripheral artery disease) ([place]) 03/27/2018  . Smoking history 03/27/2018  . Chronic left shoulder pain 03/13/2018  . History of systemic reaction to bee sting 01/28/2018  . Prediabetes 01/28/2018  . History of elevated PSA 01/28/2018  . Fall with injury 05/22/2017  . AAA (abdominal aortic aneurysm) ([place]) 05/22/2017  . Frequent headaches 01/15/2017  . Osteoarthritis 07/13/2016  . Chronic foot pain 07/13/2016  . Encounter for abdominal aortic aneurysm (AAA) screening 02/17/2016  . Hyperlipidemia 11/27/2015  . Chronic right-sided thoracic back pain 11/27/2015  . Chronic fatigue 11/27/2015  . SVT  (supraventricular tachycardia) ([place]) 09/16/2015  . Paroxysmal atrial fibrillation ([place]) 09/16/2015  . 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 Esophageal reflux 09/16/2015  . Preventative health care 09/16/2015    Past Surgical History:  Procedure Laterality Date  . CARDIAC CATHETERIZATION  [address], [place]  . CATARACT EXTRACTION W/PHACO Left 07/27/2015   Procedure: CATARACT EXTRACTION PHACO AND INTRAOCULAR LENS PLACEMENT (IOC);  Surgeon: Leandrew Koyanagi, MD;  Location: [facility];  Service: Ophthalmology;  Laterality: Left;  TORIC  . CATARACT EXTRACTION W/PHACO Right 08/24/2015   Procedure: CATARACT EXTRACTION PHACO AND INTRAOCULAR LENS PLACEMENT (IOC);  Surgeon: Leandrew Koyanagi, MD;  Location: [facility];  Service: Ophthalmology;  Laterality: Right;  TORIC  . HEMORROIDECTOMY  2010   banding  . KIDNEY STONE SURGERY  9/08, 6/15   lithotripsy  . PROSTATE BIOPSY  2007  . SKIN CANCER EXCISION    . TOENAIL TRIMMING  11/2017   Dr. Elvina Mattes  . VASECTOMY      Prior to Admission medications   Medication Sig Start Date End Date Taking? Authorizing Provider  metoprolol succinate (TOPROL XL) 25 MG 24 hr tablet TAKE 1 TABLET DAILY WITH OR IMMEDIATELY FOLLOWING A MEAL 04/02/18  Yes Gollan, Kathlene November, MD  Multiple Vitamins-Minerals (PRESERVISION AREDS 2 PO) Take by mouth 2 (two) times daily.   Yes [provider]  Nutritional Supplements (DHEA PO) Take by mouth.   Yes [provider]  omeprazole (PRILOSEC) 20 MG capsule Take  1 capsule (20 mg total) by mouth daily. 05/22/17  Yes Venia Carbon, MD  Probiotic Product (PROBIOTIC DAILY PO) Take by mouth.   Yes [provider]  rivaroxaban (XARELTO) 20 MG TABS tablet TAKE 1 TABLET DAILY WITH SUPPER 04/02/18  Yes Gollan, Kathlene November, MD  tiZANidine (ZANAFLEX) 4 MG tablet Take 1 tablet (4 mg total) by mouth at bedtime as needed and may repeat dose one time if needed for muscle spasms. 06/05/18  Yes Lesleigh Noe, MD  Coenzyme Q10 (COQ10) 200 MG CAPS Take 200 mg by mouth daily.    [provider]  diltiazem (CARDIZEM) 30 MG tablet Take 1 tablet (30 mg total) by mouth 3 (three) times daily as needed. 01/11/17   Minna Merritts, MD  Magnesium 500 MG CAPS Take 500 mg by mouth daily.    [provider]  Misc Natural Products (GLUCOSAMINE CHONDROITIN ADV PO) Take 1 tablet by mouth daily.    [provider]  ondansetron (ZOFRAN ODT) 4 MG disintegrating tablet Take 1 tablet (4 mg total) by mouth every 8 (eight) hours as needed for nausea or vomiting. 06/07/18   Lavonia Drafts, MD  oxyCODONE (ROXICODONE) 5 MG immediate release tablet Take 1 tablet (5 mg total) by mouth every 8 (eight) hours as needed. 06/07/18 06/07/19  Lavonia Drafts, MD  Polyethyl Glycol-Propyl Glycol (SYSTANE OP) Apply 1 drop to eye 3 (three) times daily.    [provider]     Allergies Other  Family History  Problem Relation Age of Onset  . AAA (abdominal aortic aneurysm) Father   . Parkinson's disease Mother   . Arthritis Mother     Social History Social History   Tobacco Use  . Smoking status: Former Smoker    Packs/day: 0.50    Years: 38.00    Pack years: 19.00    Types: Cigarettes    Last attempt to quit: 07/13/1997    Years since quitting: 20.9  . Smokeless tobacco: Never Used  Substance Use Topics  . Alcohol use: No  . Drug use: No    Review of Systems  Constitutional: No fever/chills Eyes: No visual changes.  ENT: No sore throat. Cardiovascular: Denies chest pain. Respiratory: Denies shortness of breath. Gastrointestinal: No abdominal pain.  No nausea, no vomiting.   Genitourinary: Negative for dysuria. Musculoskeletal: Negative for back pain. Skin: Negative for rash. Neurological: Negative for headaches or weakness   ____________________________________________   PHYSICAL EXAM:  VITAL SIGNS: ED Triage Vitals  Enc Vitals Group     BP 06/07/18 0603 (!)  142/74     Pulse Rate 06/07/18 0603 62     Resp 06/07/18 0603 18     Temp 06/07/18 0603 98.2 F (36.8 C)     Temp Source 06/07/18 0603 Oral     SpO2 06/07/18 0603 95 %     Weight 06/07/18 0600 90.7 kg (200 lb)     Height 06/07/18 0600 1.778 m (5\' 10" )     Head Circumference --      Peak Flow --      Pain Score 06/07/18 0600 8     Pain Loc --      Pain Edu? --      Excl. in [place]? --     Constitutional: Alert and oriented.  Uncomfortable, sitting un-moving in bed   Nose: No congestion/rhinnorhea. Mouth/Throat: Mucous membranes are moist.    Cardiovascular: Normal rate, regular rhythm. Grossly normal heart sounds.  Good peripheral circulation.  Respiratory: Normal respiratory effort.  No retractions. Lungs CTAB. Gastrointestinal: Soft and nontender. No distention.    Musculoskeletal: Normal strength in the lower extremities. warm and well perfused with normal pulses distally.  Back: No vertebral tenderness to palpation Neurologic:  Normal speech and language. No gross focal neurologic deficits are appreciated.  Skin:  Skin is warm, dry and intact. No rash noted. Psychiatric: Mood and affect are normal. Speech and behavior are normal.  ____________________________________________   LABS (all labs ordered are listed, but only abnormal results are displayed)  Labs Reviewed - No data to display ____________________________________________  EKG   ____________________________________________  RADIOLOGY  MR lumbar spine ____________________________________________   PROCEDURES  Procedure(s) performed: No  Procedures   Critical Care performed: No ____________________________________________   INITIAL IMPRESSION / ASSESSMENT AND PLAN / ED COURSE  Pertinent labs & imaging results that were available during my care of the patient were reviewed by me and considered in my medical decision making (see chart for details).  Patient presents with acute on chronic back  pain, given IV Dilaudid and IV Decadron with temporary relief.  We will obtain MRI given relatively abrupt onset of worsening pain  MRI is overall reassuring.  Patient is feeling improved.  Will discharge with analgesics, outpatient follow-up.  Patient and his wife are content with this plan    ____________________________________________   FINAL CLINICAL IMPRESSION(S) / ED DIAGNOSES  Final diagnoses:  Acute midline low back pain without sciatica        Note:  This document was prepared using Dragon voice recognition software and may include unintentional dictation errors.   Lavonia Drafts, MD 06/07/18 1329

## 2018-06-07 NOTE — ED Triage Notes (Signed)
Pt arrives via [facility] with c/o back pain x 3 days. Pt denies injury or trauma at this time. Pt is in NAD.

## 2018-06-07 NOTE — ED Notes (Signed)
Pt taken to MRI at this time

## 2018-06-07 NOTE — ED Notes (Signed)
ED Provider at bedside. 

## 2018-06-12 DIAGNOSIS — M5136 Other intervertebral disc degeneration, lumbar region: Secondary | ICD-10-CM | POA: Diagnosis not present

## 2018-06-12 DIAGNOSIS — M48061 Spinal stenosis, lumbar region without neurogenic claudication: Secondary | ICD-10-CM | POA: Diagnosis not present

## 2018-06-20 ENCOUNTER — Encounter: Payer: Self-pay | Admitting: Registered"

## 2018-06-20 ENCOUNTER — Encounter: Payer: Medicare Other | Attending: Primary Care | Admitting: Registered"

## 2018-06-20 DIAGNOSIS — R7303 Prediabetes: Secondary | ICD-10-CM | POA: Diagnosis not present

## 2018-06-20 NOTE — Progress Notes (Signed)
Patient was seen on 06/20/18 for the Diabetes Prevention Program course at [facility]. By the end of this session patients are able to complete the following objectives:   Learning Objectives:  Reflect on lifestyle changes they have made since starting the DPP.   Set long-term goals to promote continued maintenance of lifestyle changes made during the program.   Goals:   Work toward reaching new long-term goals set during class.   Follow-Up Plan:  Contact Lifestyle Coach with questions/concerns PRN.

## 2018-07-14 MED ORDER — OMEPRAZOLE 20 MG PO CPDR: 20.0000 mg | DELAYED_RELEASE_CAPSULE | Freq: Every day | ORAL | 1 refills | Status: DC

## 2018-09-22 ENCOUNTER — Telehealth: Payer: Self-pay | Admitting: Cardiovascular Disease

## 2018-09-22 ENCOUNTER — Telehealth (INDEPENDENT_AMBULATORY_CARE_PROVIDER_SITE_OTHER): Payer: Medicare Other | Admitting: Cardiovascular Disease

## 2018-09-22 ENCOUNTER — Other Ambulatory Visit: Payer: Self-pay

## 2018-09-22 DIAGNOSIS — I739 Peripheral vascular disease, unspecified: Secondary | ICD-10-CM | POA: Diagnosis not present

## 2018-09-22 DIAGNOSIS — I700 Atherosclerosis of aorta: Secondary | ICD-10-CM | POA: Diagnosis not present

## 2018-09-22 DIAGNOSIS — I471 Supraventricular tachycardia, unspecified: Secondary | ICD-10-CM

## 2018-09-22 DIAGNOSIS — I480 Paroxysmal atrial fibrillation: Secondary | ICD-10-CM | POA: Diagnosis not present

## 2018-09-22 DIAGNOSIS — Z87891 Personal history of nicotine dependence: Secondary | ICD-10-CM

## 2018-09-22 DIAGNOSIS — E782 Mixed hyperlipidemia: Secondary | ICD-10-CM

## 2018-09-22 NOTE — Patient Instructions (Signed)

## 2018-09-22 NOTE — Telephone Encounter (Signed)
Virtual Visit Pre-Appointment Phone Call  "(Name), I am calling you today to discuss your upcoming appointment. We are currently trying to limit exposure to the virus that causes COVID-19 by seeing patients at home rather than in the office."  1. "What is the BEST phone number to call the day of the visit?" - include this in appointment notes  2. Do you have or have access to (through a family member/friend) a smartphone with video capability that we can use for your visit?" a. If yes - list this number in appt notes as cell (if different from BEST phone #) and list the appointment type as a VIDEO visit in appointment notes b. If no - list the appointment type as a PHONE visit in appointment notes  3. Confirm consent - "In the setting of the current Covid19 crisis, you are scheduled for a (phone or video) visit with your provider on (date) at (time).  Just as we do with many in-office visits, in order for you to participate in this visit, we must obtain consent.  If you'd like, I can send this to your mychart (if signed up) or email for you to review.  Otherwise, I can obtain your verbal consent now.  All virtual visits are billed to your insurance company just like a normal visit would be.  By agreeing to a virtual visit, we'd like you to understand that the technology does not allow for your provider to perform an examination, and thus may limit your provider's ability to fully assess your condition. If your provider identifies any concerns that need to be evaluated in person, we will make arrangements to do so.  Finally, though the technology is pretty good, we cannot assure that it will always work on either your or our end, and in the setting of a video visit, we may have to convert it to a phone-only visit.  In either situation, we cannot ensure that we have a secure connection.  Are you willing to proceed?" STAFF: Did the patient verbally acknowledge consent to telehealth visit? Document  YES/NO here: YES  4. Advise patient to be prepared - "Two hours prior to your appointment, go ahead and check your blood pressure, pulse, oxygen saturation, and your weight (if you have the equipment to check those) and write them all down. When your visit starts, your provider will ask you for this information. If you have an Apple Watch or Kardia device, please plan to have heart rate information ready on the day of your appointment. Please have a pen and paper handy nearby the day of the visit as well."  5. Give patient instructions for MyChart download to smartphone OR Doximity/Doxy.me as below if video visit (depending on what platform provider is using)  6. Inform patient they will receive a phone call 15 minutes prior to their appointment time (may be from unknown caller ID) so they should be prepared to answer    TELEPHONE CALL NOTE  Adam Juarez has been deemed a candidate for a follow-up tele-health visit to limit community exposure during the Covid-19 pandemic. I spoke with the patient via phone to ensure availability of phone/video source, confirm preferred email & phone number, and discuss instructions and expectations.  I reminded Adam Juarez to be prepared with any vital sign and/or heart rhythm information that could potentially be obtained via home monitoring, at the time of his visit. I reminded Adam Juarez to expect a phone call prior to his visit.  Clarisse Gouge 09/22/2018 9:17 AM   INSTRUCTIONS FOR DOWNLOADING THE MYCHART APP TO SMARTPHONE  - The patient must first make sure to have activated MyChart and know their login information - If Apple, go to [employer] and type in MyChart in the search bar and download the app. If Android, ask patient to go to [employer] and type in [place] in the search bar and download the app. The app is free but as with any other app downloads, their phone may require them to verify saved payment information or Apple/Android  password.  - The patient will need to then log into the app with their MyChart username and password, and select [facility] as their healthcare provider to link the account. When it is time for your visit, go to the MyChart app, find appointments, and click Begin Video Visit. Be sure to Select Allow for your device to access the Microphone and Camera for your visit. You will then be connected, and your provider will be with you shortly.  **If they have any issues connecting, or need assistance please contact MyChart service desk [phone])**  **If using a computer, in order to ensure the best quality for their visit they will need to use either of the following Internet Browsers: [employer], or Google Chrome**  IF USING DOXIMITY or DOXY.ME - The patient will receive a link just prior to their visit by text.     FULL LENGTH CONSENT FOR TELE-HEALTH VISIT   I hereby voluntarily request, consent and authorize [facility] and its employed or contracted physicians, physician assistants, nurse practitioners or other licensed health care professionals (the Practitioner), to provide me with telemedicine health care services (the Services") as deemed necessary by the treating Practitioner. I acknowledge and consent to receive the Services by the Practitioner via telemedicine. I understand that the telemedicine visit will involve communicating with the Practitioner through live audiovisual communication technology and the disclosure of certain medical information by electronic transmission. I acknowledge that I have been given the opportunity to request an in-person assessment or other available alternative prior to the telemedicine visit and am voluntarily participating in the telemedicine visit.  I understand that I have the right to withhold or withdraw my consent to the use of telemedicine in the course of my care at any time, without affecting my right to future care or treatment,  and that the Practitioner or I may terminate the telemedicine visit at any time. I understand that I have the right to inspect all information obtained and/or recorded in the course of the telemedicine visit and may receive copies of available information for a reasonable fee.  I understand that some of the potential risks of receiving the Services via telemedicine include:   Delay or interruption in medical evaluation due to technological equipment failure or disruption;  Information transmitted may not be sufficient (e.g. poor resolution of images) to allow for appropriate medical decision making by the Practitioner; and/or   In rare instances, security protocols could fail, causing a breach of personal health information.  Furthermore, I acknowledge that it is my responsibility to provide information about my medical history, conditions and care that is complete and accurate to the best of my ability. I acknowledge that Practitioner's advice, recommendations, and/or decision may be based on factors not within their control, such as incomplete or inaccurate data provided by me or distortions of diagnostic images or specimens that may result from electronic transmissions. I understand that the  practice of medicine is not an Chief Strategy Officer and that Practitioner makes no warranties or guarantees regarding treatment outcomes. I acknowledge that I will receive a copy of this consent concurrently upon execution via email to the email address I last provided but may also request a printed copy by calling the office of [facility].    I understand that my insurance will be billed for this visit.   I have read or had this consent read to me.  I understand the contents of this consent, which adequately explains the benefits and risks of the Services being provided via telemedicine.   I have been provided ample opportunity to ask questions regarding this consent and the Services and have had my questions  answered to my satisfaction.  I give my informed consent for the services to be provided through the use of telemedicine in my medical care  By participating in this telemedicine visit I agree to the above.

## 2018-09-22 NOTE — Progress Notes (Signed)
Virtual Visit via Video Note   This visit type was conducted due to national recommendations for restrictions regarding the COVID-19 Pandemic (e.g. social distancing) in an effort to limit this patient's exposure and mitigate transmission in our community.  Due to his co-morbid illnesses, this patient is at least at moderate risk for complications without adequate follow up.  This format is felt to be most appropriate for this patient at this time.  All issues noted in this document were discussed and addressed.  A limited physical exam was performed with this format.  Please refer to the patient's chart for his consent to telehealth for [facility].   I connected with  [name] on 09/22/18 by a video enabled telemedicine application and verified that I am speaking with the correct person using two identifiers. I discussed the limitations of evaluation and management by telemedicine. The patient expressed understanding and agreed to proceed.   Evaluation Performed:  Follow-up visit  Date:  09/22/2018   ID:  Adam Juarez, DOB 12-26-1940, MRN [id number]  Patient Location:  [place] [address]   Provider location:   [facility], [place] office  PCP:  Pleas Koch, NP  Cardiologist:  Patsy Baltimore   Chief Complaint:  Palpitations    History of Present Illness:    Adam Juarez is a 78 y.o. male who presents via audio/video conferencing for a telehealth visit today.   The patient does not symptoms concerning for COVID-19 infection (fever, chills, cough, or new SHORTNESS OF BREATH).   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 on CT scan in 2015 ,  paroxysmal atrial fibrillation,  prior Holter monitor August 2013 showing frequent PVCs,  frequent short runs of supraventricular tachycardia,  stress test at that time showing no ischemia September 2013,  Prior smoking  history for 35-40 years who presents for follow-up of his paroxysmal atrial fibrillation  On today's visit reports he has had some Episodes , to describe,, feels drained, Possible tachycardia palpitations Last Friday, had coke and coffee Increasing tachy/palpitations over the weekend SOB, little squeeze, lethargic, for seconds  He does have iWatch but did not take telemetry measurements when he was having the symptoms nor did he check his blood pressure or heart rate  Currently feels back to normal  Tolerating anticoagulation CHADS VASC 3, calculation discussed with him  Previous total cholesterol 140, now in the 160s  Other past medical history reviewed event monitor end of September for 30 days showing no significant atrial fibrillation.   CT scan abdomenJune 2015   Moderate diffuse descending aorta disease extending into the iliac vessels     tachycardia in April 2017 Monitors heart rate on his watch Numerous episodes of tachycardia in March 2017. last in November 2016.  He was admitted to the hospital for atrial fibrillation 08/08/2013, notes indicating he converted to normal sinus rhythm through his hospital course. He was not discharged on medications as he declined beta blockers  echocardiogram August was 7 2013  showing normal LV function, mild LVH, a stock dysfunction, aortic valve sclerosis, normal right heart pressures  Holter monitor report August 2013 detailing 12,000 PVCs, 11% of total beat count. 55 supraventricular ectopies, no mention of the longest run   Prior CV studies:   The following studies were reviewed today:    Past Medical History:  Diagnosis Date  . Arthritis  fingers  . 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 Chicken pox   . Dysrhythmia 09/2013   Hx. a-fib x 1 episode patient states he "auto corrected" seen at [facility]  . GERD (gastroesophageal reflux disease)   . Headache    poor posture - none recently  .  PSVT (paroxysmal supraventricular tachycardia) ([place]) 2009   Controlled with "breathing process"  . Renal stone 9/08, 6/15  . Wears dentures    full upper   Past Surgical History:  Procedure Laterality Date  . CARDIAC CATHETERIZATION  [address] [place]  . CATARACT EXTRACTION W/PHACO Left 07/27/2015   Procedure: CATARACT EXTRACTION PHACO AND INTRAOCULAR LENS PLACEMENT (IOC);  Surgeon: Leandrew Koyanagi, MD;  Location: [facility];  Service: Ophthalmology;  Laterality: Left;  TORIC  . CATARACT EXTRACTION W/PHACO Right 08/24/2015   Procedure: CATARACT EXTRACTION PHACO AND INTRAOCULAR LENS PLACEMENT (IOC);  Surgeon: Leandrew Koyanagi, MD;  Location: [facility];  Service: Ophthalmology;  Laterality: Right;  TORIC  . HEMORROIDECTOMY  2010   banding  . KIDNEY STONE SURGERY  9/08, 6/15   lithotripsy  . PROSTATE BIOPSY  2007  . SKIN CANCER EXCISION    . TOENAIL TRIMMING  11/2017   Dr. Elvina Mattes  . VASECTOMY       Current Meds  Medication Sig  . diltiazem (CARDIZEM) 30 MG tablet Take 1 tablet (30 mg total) by mouth 3 (three) times daily as needed.  . Magnesium 500 MG CAPS Take 500 mg by mouth daily.  . metoprolol succinate (TOPROL XL) 25 MG 24 hr tablet TAKE 1 TABLET DAILY WITH OR IMMEDIATELY FOLLOWING A MEAL  . Misc Natural Products (GLUCOSAMINE CHONDROITIN ADV PO) Take 1 tablet by mouth daily.  . Multiple Vitamins-Minerals (PRESERVISION AREDS 2 PO) Take by mouth 2 (two) times daily.  Marland Kitchen omeprazole (PRILOSEC) 20 MG capsule Take 1 capsule (20 mg total) by mouth daily.  . ondansetron (ZOFRAN ODT) 4 MG disintegrating tablet Take 1 tablet (4 mg total) by mouth every 8 (eight) hours as needed for nausea or vomiting.  Vladimir Faster Glycol-Propyl Glycol (SYSTANE OP) Apply 1 drop to eye 3 (three) times daily.  . Probiotic Product (PROBIOTIC DAILY PO) Take by mouth.  . rivaroxaban (XARELTO) 20 MG TABS tablet TAKE 1 TABLET DAILY WITH SUPPER     Allergies:   Other   Social  History   Tobacco Use  . Smoking status: Former Smoker    Packs/day: 0.50    Years: 38.00    Pack years: 19.00    Types: Cigarettes    Last attempt to quit: 07/13/1997    Years since quitting: 21.2  . Smokeless tobacco: Never Used  Substance Use Topics  . Alcohol use: No  . Drug use: No     Current Outpatient Medications on File Prior to Visit  Medication Sig Dispense Refill  . diltiazem (CARDIZEM) 30 MG tablet Take 1 tablet (30 mg total) by mouth 3 (three) times daily as needed. 90 tablet 3  . Magnesium 500 MG CAPS Take 500 mg by mouth daily.    . metoprolol succinate (TOPROL XL) 25 MG 24 hr tablet TAKE 1 TABLET DAILY WITH OR IMMEDIATELY FOLLOWING A MEAL 90 tablet 4  . Misc Natural Products (GLUCOSAMINE CHONDROITIN ADV PO) Take 1 tablet by mouth daily.    . Multiple Vitamins-Minerals (PRESERVISION AREDS 2 PO) Take by mouth 2 (two) times daily.    Marland Kitchen omeprazole (PRILOSEC) 20 MG capsule Take  1 capsule (20 mg total) by mouth daily. 90 capsule 1  . ondansetron (ZOFRAN ODT) 4 MG disintegrating tablet Take 1 tablet (4 mg total) by mouth every 8 (eight) hours as needed for nausea or vomiting. 20 tablet 0  . Polyethyl Glycol-Propyl Glycol (SYSTANE OP) Apply 1 drop to eye 3 (three) times daily.    . Probiotic Product (PROBIOTIC DAILY PO) Take by mouth.    . rivaroxaban (XARELTO) 20 MG TABS tablet TAKE 1 TABLET DAILY WITH SUPPER 90 tablet 1  . Coenzyme Q10 (COQ10) 200 MG CAPS Take 200 mg by mouth daily.    . Nutritional Supplements (DHEA PO) Take by mouth.    . oxyCODONE (ROXICODONE) 5 MG immediate release tablet Take 1 tablet (5 mg total) by mouth every 8 (eight) hours as needed. (Patient not taking: Reported on 09/22/2018) 20 tablet 0  . tiZANidine (ZANAFLEX) 4 MG tablet Take 1 tablet (4 mg total) by mouth at bedtime as needed and may repeat dose one time if needed for muscle spasms. (Patient not taking: Reported on 09/22/2018) 30 tablet 0   No current facility-administered medications on file  prior to visit.      Family Hx: The patient's family history includes AAA (abdominal aortic aneurysm) in his father; Arthritis in his mother; Parkinson's disease in his mother.  ROS:   Please see the history of present illness.    Review of Systems  Constitutional: Negative.   HENT: Negative.   Respiratory: Positive for shortness of breath.   Cardiovascular: Positive for palpitations.  Gastrointestinal: Negative.   Musculoskeletal: Negative.   Neurological: Negative.   Psychiatric/Behavioral: Negative.   All other systems reviewed and are negative.    Labs/Other Tests and Data Reviewed:    Recent Labs: 01/23/2018: ALT 14; BUN 25; Creatinine, Ser 0.94; Potassium 4.1; Sodium 140 03/13/2018: Hemoglobin 15.1; Platelets 150.0; TSH 2.15   Recent Lipid Panel Lab Results  Component Value Date/Time   CHOL 167 01/23/2018 08:19 AM   TRIG 359.0 (H) 01/23/2018 08:19 AM   HDL 31.20 (L) 01/23/2018 08:19 AM   CHOLHDL 5 01/23/2018 08:19 AM   LDLDIRECT 76.0 01/23/2018 08:19 AM    Wt Readings from Last 3 Encounters:  06/20/18 201 lb 14.4 oz (91.6 kg)  06/07/18 200 lb (90.7 kg)  06/05/18 206 lb 4 oz (93.6 kg)     Exam:    Vital Signs: Vital signs may also be detailed in the HPI There were no vitals taken for this visit.  Wt Readings from Last 3 Encounters:  06/20/18 201 lb 14.4 oz (91.6 kg)  06/07/18 200 lb (90.7 kg)  06/05/18 206 lb 4 oz (93.6 kg)   Temp Readings from Last 3 Encounters:  06/07/18 98.2 F (36.8 C) (Oral)  06/05/18 97.6 F (36.4 C)  03/13/18 98 F (36.7 C) (Oral)   BP Readings from Last 3 Encounters:  06/07/18 (!) 148/89  06/05/18 (!) 142/78  03/27/18 124/70   Pulse Readings from Last 3 Encounters:  06/07/18 73  06/05/18 61  03/27/18 71     Pulse 70,  BP 131/81, RESP 16 EKG NSR rate 73  Well nourished, well developed male in no acute distress. Constitutional:  oriented to person, place, and time. No distress.  Head: Normocephalic and atraumatic.   Eyes:  no discharge. No scleral icterus.  Neck: Normal range of motion. Neck supple.  Pulmonary/Chest: No audible wheezing, no distress, appears comfortable Musculoskeletal: Normal range of motion.  no  tenderness or deformity.  Neurological:  Coordination normal. Full exam not performed Skin:  No rash Psychiatric:  normal mood and affect. behavior is normal. Thought content normal.    ASSESSMENT & PLAN:    Paroxysmal atrial fibrillation (HCC) - Plan: EKG 12-Lead Chads vasc of  3 Tolerating anticoagulation.  Continue metoprolol Possibly having episodes of SVT or PVCs or atrial fibrillation Uses watch to measure his rhythm We did discuss adding propranolol as needed or starting diltiazem as needed For severe symptoms could increase metoprolol up to twice a day  Aortic atherosclerosis (HCC)   moderate disease seen on CT scan   long years of smoking Previously declined a statin and Zetia  Smoking history  smoking history, likely responsible for his aortic atherosclerosis No longer smoking  Mixed hyperlipidemia Previously discussed management of cholesterol, declined medications  Chronic fatigue Recommended a regular exercise program   Abdominal aortic aneurysm (AAA) without rupture ([place]) Mildly dilated aorta seen on CT scan in 2015 , 3.3 cm  ultrasound result did not show much progression  No further work-up  PAD Likely secondary to long-standing smoking.  Ideally would like aggressive lipid management  COVID-19 Education: The signs and symptoms of COVID-19 were discussed with the patient and how to seek care for testing (follow up with PCP or arrange E-visit).  The importance of social distancing was discussed today.  Patient Risk:   After full review of this patients clinical status, I feel that they are at least moderate risk at this time.  Time:   Today, I have spent 25 minutes with the patient with telehealth technology discussing the cardiac and medical  problems/diagnoses detailed above   10 min spent reviewing the chart prior to patient visit today   Medication Adjustments/Labs and Tests Ordered: Current medicines are reviewed at length with the patient today.  Concerns regarding medicines are outlined above.   Tests Ordered: No tests ordered   Medication Changes: No changes made   Disposition: Follow-up in 6 months   Signed, Ida Rogue, MD  09/22/2018 10:57 AM    [facility] Office [address] [place] [address], [place], [place] [id number]

## 2018-10-22 DIAGNOSIS — D485 Neoplasm of uncertain behavior of skin: Secondary | ICD-10-CM | POA: Diagnosis not present

## 2018-10-22 DIAGNOSIS — Z08 Encounter for follow-up examination after completed treatment for malignant neoplasm: Secondary | ICD-10-CM | POA: Diagnosis not present

## 2018-10-22 DIAGNOSIS — L570 Actinic keratosis: Secondary | ICD-10-CM | POA: Diagnosis not present

## 2018-10-22 DIAGNOSIS — X32XXXA Exposure to sunlight, initial encounter: Secondary | ICD-10-CM | POA: Diagnosis not present

## 2018-10-22 DIAGNOSIS — C44619 Basal cell carcinoma of skin of left upper limb, including shoulder: Secondary | ICD-10-CM | POA: Diagnosis not present

## 2018-10-22 DIAGNOSIS — Z85828 Personal history of other malignant neoplasm of skin: Secondary | ICD-10-CM | POA: Diagnosis not present

## 2018-10-28 ENCOUNTER — Telehealth: Payer: Self-pay | Admitting: Cardiovascular Disease

## 2018-10-28 NOTE — Telephone Encounter (Signed)
Spoke to patient. States he had 5 episodes yesterday of palpitations in his heart.  He described it as "the hamster running on the wheel." Lasted a few seconds. Feels lighteheaded and somewhat of confusion. Denies chest pain or shortness of breath during. Has Apple Watch and is still practicing on taking EKGs and HRs. Usually, HR is between 70-73 when he takes it. He has not had any episodes thus far today.  Last Telemedicine visit with Dr Rockey Situ was 09/22/18: "Paroxysmal atrial fibrillation ([facility])- Plan: EKG 12-Lead Chads vasc of 3 Tolerating anticoagulation. Continue metoprolol Possibly having episodes of SVT or PVCs or atrial fibrillation Uses watch to measure his rhythm We did discuss adding propranolol as needed or starting diltiazem as needed For severe symptoms could increase metoprolol up to twice a day"   Routing to Dr Rockey Situ to please advise on plan of care.  Patient aware he may not receive a call back today but will call us if the episodes increase through today.

## 2018-10-28 NOTE — Telephone Encounter (Signed)
Patient c/o Palpitations:  High priority if patient c/o lightheadedness, shortness of breath, or chest pain  1) How long have you had palpitations/irregular HR/ Afib? Are you having the symptoms now?  No episode now, but had 5 episodes yesterday  2) Are you currently experiencing lightheadedness, SOB or CP? No   3) Do you have a history of afib (atrial fibrillation) or irregular heart rhythm? yes  4) Have you checked your BP or HR? (document readings if available):  He did not think about checking it during these episodes  5) Are you experiencing any other symptoms? Just after each episode, feels very tired

## 2018-10-29 ENCOUNTER — Telehealth (INDEPENDENT_AMBULATORY_CARE_PROVIDER_SITE_OTHER): Payer: Medicare Other | Admitting: Cardiovascular Disease

## 2018-10-29 ENCOUNTER — Other Ambulatory Visit: Payer: Self-pay

## 2018-10-29 DIAGNOSIS — E782 Mixed hyperlipidemia: Secondary | ICD-10-CM | POA: Diagnosis not present

## 2018-10-29 DIAGNOSIS — I480 Paroxysmal atrial fibrillation: Secondary | ICD-10-CM

## 2018-10-29 DIAGNOSIS — I739 Peripheral vascular disease, unspecified: Secondary | ICD-10-CM

## 2018-10-29 DIAGNOSIS — Z87891 Personal history of nicotine dependence: Secondary | ICD-10-CM | POA: Diagnosis not present

## 2018-10-29 DIAGNOSIS — I471 Supraventricular tachycardia, unspecified: Secondary | ICD-10-CM

## 2018-10-29 DIAGNOSIS — I700 Atherosclerosis of aorta: Secondary | ICD-10-CM

## 2018-10-29 MED ORDER — METOPROLOL SUCCINATE ER 25 MG PO TB24: 25.0000 mg | ORAL_TABLET | Freq: Two times a day (BID) | ORAL | 3 refills | Status: DC

## 2018-10-29 MED ORDER — DILTIAZEM HCL 30 MG PO TABS: 30.0000 mg | ORAL_TABLET | Freq: Three times a day (TID) | ORAL | 3 refills | Status: DC | PRN

## 2018-10-29 NOTE — Telephone Encounter (Signed)
Would he like to increase metoprolol succinate up to 25 twice daily or 50 daily? This might help control some of his rhythms

## 2018-10-29 NOTE — Telephone Encounter (Signed)
Patient states he had an afib episode during the night and also about 30 minutes ago. Please call to discuss, patient states he has not heard anything back from his call yesterday.

## 2018-10-29 NOTE — Progress Notes (Signed)
Virtual Visit via Video Note   This visit type was conducted due to national recommendations for restrictions regarding the COVID-19 Pandemic (e.g. social distancing) in an effort to limit this patient's exposure and mitigate transmission in our community.  Due to his co-morbid illnesses, this patient is at least at moderate risk for complications without adequate follow up.  This format is felt to be most appropriate for this patient at this time.  All issues noted in this document were discussed and addressed.  A limited physical exam was performed with this format.  Please refer to the patient's chart for his consent to telehealth for [facility].   I connected with  [name] on 10/29/18 by a video enabled telemedicine application and verified that I am speaking with the correct person using two identifiers. I discussed the limitations of evaluation and management by telemedicine. The patient expressed understanding and agreed to proceed.   Evaluation Performed:  Follow-up visit  Date:  10/29/2018   ID:  Adam Juarez, DOB 1940/08/21, MRN [id number]  Patient Location:  [place] [address]   Provider location:   [facility], [place] office  PCP:  Pleas Koch, NP  Cardiologist:  Patsy Baltimore   Chief Complaint:  Tachycardia episodes   History of Present Illness:    Adam Juarez is a 78 y.o. male who presents via audio/video conferencing for a telehealth visit today.   The patient does not symptoms concerning for COVID-19 infection (fever, chills, cough, or new SHORTNESS OF BREATH).   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  Concern for SVT Started taking old diltiazem from 2 years ago Would like to change his medications to suppress the rhythm  Otherwise reports he has been in good health Tolerating anticoagulation CHADS VASC 3  Previous total cholesterol 140 up to 160  Other past medical history reviewed event monitor end of September for 30 days showing no significant atrial fibrillation.   CT scan abdomenJune 2015   Moderate diffuse descending aorta disease extending into the iliac vessels     tachycardia in April 2017 Monitors heart rate on his watch Numerous episodes of tachycardia in March 2017. last in November 2016.  He was admitted to the hospital for atrial fibrillation 08/08/2013, notes indicating he converted to normal sinus rhythm through his hospital course. He was not discharged on medications as he declined beta blockers  echocardiogram August was 7 2013  showing normal LV function, mild LVH, a stock dysfunction, aortic valve sclerosis, normal right heart pressures  Holter monitor report August 2013 detailing 12,000 PVCs, 11% of total beat count. 55 supraventricular ectopies, no mention of the longest run   Prior CV studies:   The following studies were reviewed today:    Past Medical History:  Diagnosis Date  . Arthritis    fingers  . 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 Chicken pox   .  Dysrhythmia 09/2013   Hx. a-fib x 1 episode patient states he "auto corrected" seen at [facility]  . GERD (gastroesophageal reflux disease)   . Headache    poor posture - none recently  . PSVT (paroxysmal supraventricular tachycardia) ([place]) 2009   Controlled with "breathing process"  . Renal stone 9/08, 6/15  .  Wears dentures    full upper   Past Surgical History:  Procedure Laterality Date  . CARDIAC CATHETERIZATION  [address] [place]  . CATARACT EXTRACTION W/PHACO Left 07/27/2015   Procedure: CATARACT EXTRACTION PHACO AND INTRAOCULAR LENS PLACEMENT (IOC);  Surgeon: Leandrew Koyanagi, MD;  Location: [place];  Service: Ophthalmology;  Laterality: Left;  TORIC  . CATARACT EXTRACTION W/PHACO Right 08/24/2015   Procedure: CATARACT EXTRACTION PHACO AND INTRAOCULAR LENS PLACEMENT (IOC);  Surgeon: Leandrew Koyanagi, MD;  Location: [place];  Service: Ophthalmology;  Laterality: Right;  TORIC  . HEMORROIDECTOMY  2010   banding  . KIDNEY STONE SURGERY  9/08, 6/15   lithotripsy  . PROSTATE BIOPSY  2007  . SKIN CANCER EXCISION    . TOENAIL TRIMMING  11/2017   Dr. Elvina Mattes  . VASECTOMY       No outpatient medications have been marked as taking for the 10/29/18 encounter (Telemedicine) with Minna Merritts, MD.     Allergies:   Other   Social History   Tobacco Use  . Smoking status: Former Smoker    Packs/day: 0.50    Years: 38.00    Pack years: 19.00    Types: Cigarettes    Quit date: 07/13/1997    Years since quitting: 21.3  . Smokeless tobacco: Never Used  Substance Use Topics  . Alcohol use: No  . Drug use: No     Current Outpatient Medications on File Prior to Visit  Medication Sig Dispense Refill  . Coenzyme Q10 (COQ10) 200 MG CAPS Take 200 mg by mouth daily.    Marland Kitchen diltiazem (CARDIZEM) 30 MG tablet Take 1 tablet (30 mg total) by mouth 3 (three) times daily as needed. 90 tablet 3  . Magnesium 500 MG CAPS Take 500 mg by mouth daily.    . metoprolol succinate (TOPROL XL) 25 MG 24 hr tablet TAKE 1 TABLET DAILY WITH OR IMMEDIATELY FOLLOWING A MEAL 90 tablet 4  . Misc Natural Products (GLUCOSAMINE CHONDROITIN ADV PO) Take 1 tablet by mouth daily.    . Multiple Vitamins-Minerals (PRESERVISION AREDS 2 PO) Take by mouth 2 (two) times daily.    . Nutritional  Supplements (DHEA PO) Take by mouth.    Marland Kitchen omeprazole (PRILOSEC) 20 MG capsule Take 1 capsule (20 mg total) by mouth daily. 90 capsule 1  . ondansetron (ZOFRAN ODT) 4 MG disintegrating tablet Take 1 tablet (4 mg total) by mouth every 8 (eight) hours as needed for nausea or vomiting. 20 tablet 0  . oxyCODONE (ROXICODONE) 5 MG immediate release tablet Take 1 tablet (5 mg total) by mouth every 8 (eight) hours as needed. (Patient not taking: Reported on 09/22/2018) 20 tablet 0  . Polyethyl Glycol-Propyl Glycol (SYSTANE OP) Apply 1 drop to eye 3 (three) times daily.    . Probiotic Product (PROBIOTIC DAILY PO) Take by mouth.    . rivaroxaban (XARELTO) 20 MG TABS tablet TAKE 1 TABLET DAILY WITH SUPPER 90 tablet 1  . tiZANidine (ZANAFLEX) 4 MG tablet Take 1 tablet (4 mg total) by mouth at bedtime as needed and may repeat dose one time if needed for  muscle spasms. (Patient not taking: Reported on 09/22/2018) 30 tablet 0   No current facility-administered medications on file prior to visit.      Family Hx: The patient's family history includes AAA (abdominal aortic aneurysm) in his father; Arthritis in his mother; Parkinson's disease in his mother.  ROS:   Please see the history of present illness.    Review of Systems  Constitutional: Negative.   Respiratory: Negative.   Cardiovascular: Negative.        Tachycardia  Gastrointestinal: Negative.   Musculoskeletal: Negative.   Neurological: Negative.   Psychiatric/Behavioral: Negative.   All other systems reviewed and are negative.     Labs/Other Tests and Data Reviewed:    Recent Labs: 01/23/2018: ALT 14; BUN 25; Creatinine, Ser 0.94; Potassium 4.1; Sodium 140 03/13/2018: Hemoglobin 15.1; Platelets 150.0; TSH 2.15   Recent Lipid Panel Lab Results  Component Value Date/Time   CHOL 167 01/23/2018 08:19 AM   TRIG 359.0 (H) 01/23/2018 08:19 AM   HDL 31.20 (L) 01/23/2018 08:19 AM   CHOLHDL 5 01/23/2018 08:19 AM   LDLDIRECT 76.0 01/23/2018  08:19 AM    Wt Readings from Last 3 Encounters:  06/20/18 201 lb 14.4 oz (91.6 kg)  06/07/18 200 lb (90.7 kg)  06/05/18 206 lb 4 oz (93.6 kg)     Exam:    Vital Signs: Vital signs may also be detailed in the HPI There were no vitals taken for this visit.  Wt Readings from Last 3 Encounters:  06/20/18 201 lb 14.4 oz (91.6 kg)  06/07/18 200 lb (90.7 kg)  06/05/18 206 lb 4 oz (93.6 kg)   Temp Readings from Last 3 Encounters:  06/07/18 98.2 F (36.8 C) (Oral)  06/05/18 97.6 F (36.4 C)  03/13/18 98 F (36.7 C) (Oral)   BP Readings from Last 3 Encounters:  06/07/18 (!) 148/89  06/05/18 (!) 142/78  03/27/18 124/70   Pulse Readings from Last 3 Encounters:  06/07/18 73  06/05/18 61  03/27/18 71    140/80 rate 70 respirations 16  Well nourished, well developed male in no acute distress. Constitutional:  oriented to person, place, and time. No distress.  Head: Normocephalic and atraumatic.  Eyes:  no discharge. No scleral icterus.  Neck: Normal range of motion. Neck supple.  Pulmonary/Chest: No audible wheezing, no distress, appears comfortable Musculoskeletal: Normal range of motion.  no  tenderness or deformity.  Neurological:   Coordination normal. Full exam not performed Skin:  No rash Psychiatric:  normal mood and affect. behavior is normal. Thought content normal.    ASSESSMENT & PLAN:    Paroxysmal atrial fibrillation (HCC) -  Increase metoprolol succinate up to 25 twice daily for rate and rhythm control Continue anticoagulation  SVT (supraventricular tachycardia) ([place]) - Suspect recent episodes have been short runs of SVT Increase metoprolol as above, discussed carotid sinus massage and Valsalva maneuver Suggested he take diltiazem 30 mg pills as needed for breakthrough We may end up needing diltiazem 30 twice daily with his metoprolol succinate 25 twice daily  Aortic atherosclerosis (HCC)  Stressed importance of low weight, aggressive lipid management   PAD (peripheral artery disease) (HCC) -  No further work-up at this time  Smoking history -  Smoking cessation recommended   COVID-19 Education: The signs and symptoms of COVID-19 were discussed with the patient and how to seek care for testing (follow up with PCP or arrange E-visit).  The importance of social distancing was discussed today.  Patient Risk:  After full review of this patients clinical status, I feel that they are at least moderate risk at this time.  Time:   Today, I have spent 25 minutes with the patient with telehealth technology discussing the cardiac and medical problems/diagnoses detailed above   10 min spent reviewing the chart prior to patient visit today   Medication Adjustments/Labs and Tests Ordered: Current medicines are reviewed at length with the patient today.  Concerns regarding medicines are outlined above.   Tests Ordered: No tests ordered   Medication Changes: No changes made   Disposition: Follow-up in 6 months   Signed, Ida Rogue, MD  10/29/2018 4:13 PM    [place] Office [address] [place] [address], [place], [place] [address]

## 2018-10-29 NOTE — Patient Instructions (Addendum)
Medication Instructions:  Your physician has recommended you make the following change in your medication:  1. INCREASE Metoprolol succinate up to 25 mg twice a day 2. CONTINUE Diltiazem 30 mg three times a day as needed for tachcardia (Fast heart rates)  If you need a refill on your cardiac medications before your next appointment, please call your pharmacy.    Lab work: No new labs needed   If you have labs (blood work) drawn today and your tests are completely normal, you will receive your results only by: Marland Kitchen MyChart Message (if you have MyChart) OR . A paper copy in the mail If you have any lab test that is abnormal or we need to change your treatment, we will call you to review the results.   Testing/Procedures: No new testing needed   Follow-Up: At [facility], you and your health needs are our priority.  As part of our continuing mission to provide you with exceptional heart care, we have created designated Provider Care Teams.  These Care Teams include your primary Cardiologist (physician) and Advanced Practice Providers (APPs -  Physician Assistants and Nurse Practitioners) who all work together to provide you with the care you need, when you need it.  . You will need a follow up appointment in 6 months .   Please call our office 2 months in advance to schedule this appointment.    . Providers on your designated Care Team:   . Murray Hodgkins, NP . Christell Faith, PA-C . Marrianne Mood, PA-C  Any Other Special Instructions Will Be Listed Below (If Applicable).  For educational health videos Log in to : [email or web address] Or : [email or web address], password : [email or web address]

## 2018-10-29 NOTE — Telephone Encounter (Signed)
Spoke with patient and reviewed provider recommendations. He states that he really would like to speak with provider about this because he has diltiazem and metoprolol and this recent increase has caused concern. Reviewed that I could set him up with virtual visit with provider for today and to please have his phone near him for appointment. He verbalized understanding with no further questions. Advised that he would get a text message with link to click and enter name and then check in.   YOUR CARDIOLOGY TEAM HAS ARRANGED FOR AN E-VISIT FOR YOUR APPOINTMENT - PLEASE REVIEW IMPORTANT INFORMATION BELOW SEVERAL DAYS PRIOR TO YOUR APPOINTMENT  Due to the recent COVID-19 pandemic, we are transitioning in-person office visits to tele-medicine visits in an effort to decrease unnecessary exposure to our patients, their families, and staff. These visits are billed to your insurance just like a normal visit is. We also encourage you to sign up for MyChart if you have not already done so. You will need a smartphone if possible. For patients that do not have this, we can still complete the visit using a regular telephone but do prefer a smartphone to enable video when possible. You may have a family member that lives with you that can help. If possible, we also ask that you have a blood pressure cuff and scale at home to measure your blood pressure, heart rate and weight prior to your scheduled appointment. Patients with clinical needs that need an in-person evaluation and testing will still be able to come to the office if absolutely necessary. If you have any questions, feel free to call our office.   CONSENT FOR TELE-HEALTH VISIT - PLEASE REVIEW  I hereby voluntarily request, consent and authorize [facility] and its employed or contracted physicians, physician assistants, nurse practitioners or other licensed health care professionals (the Practitioner), to provide me with telemedicine health care services (the  "Services") as deemed necessary by the treating Practitioner. I acknowledge and consent to receive the Services by the Practitioner via telemedicine. I understand that the telemedicine visit will involve communicating with the Practitioner through live audiovisual communication technology and the disclosure of certain medical information by electronic transmission. I acknowledge that I have been given the opportunity to request an in-person assessment or other available alternative prior to the telemedicine visit and am voluntarily participating in the telemedicine visit.  I understand that I have the right to withhold or withdraw my consent to the use of telemedicine in the course of my care at any time, without affecting my right to future care or treatment, and that the Practitioner or I may terminate the telemedicine visit at any time. I understand that I have the right to inspect all information obtained and/or recorded in the course of the telemedicine visit and may receive copies of available information for a reasonable fee.  I understand that some of the potential risks of receiving the Services via telemedicine include:  Marland Kitchen Delay or interruption in medical evaluation due to technological equipment failure or disruption; . Information transmitted may not be sufficient (e.g. poor resolution of images) to allow for appropriate medical decision making by the Practitioner; and/or  . In rare instances, security protocols could fail, causing a breach of personal health information.  Furthermore, I acknowledge that it is my responsibility to provide information about my medical history, conditions and care that is complete and accurate to the best of my ability. I acknowledge that Practitioner's advice, recommendations, and/or decision may be based on factors  not within their control, such as incomplete or inaccurate data provided by me or distortions of diagnostic images or specimens that may result from  electronic transmissions. I understand that the practice of medicine is not an exact science and that Practitioner makes no warranties or guarantees regarding treatment outcomes. I acknowledge that I will receive a copy of this consent concurrently upon execution via email to the email address I last provided but may also request a printed copy by calling the office of [facility].    I understand that my insurance will be billed for this visit.   I have read or had this consent read to me. . I understand the contents of this consent, which adequately explains the benefits and risks of the Services being provided via telemedicine.  . I have been provided ample opportunity to ask questions regarding this consent and the Services and have had my questions answered to my satisfaction. . I give my informed consent for the services to be provided through the use of telemedicine in my medical care  By participating in this telemedicine visit I agree to the above.

## 2018-10-30 DIAGNOSIS — C44619 Basal cell carcinoma of skin of left upper limb, including shoulder: Secondary | ICD-10-CM | POA: Diagnosis not present

## 2018-10-31 ENCOUNTER — Telehealth: Payer: Self-pay | Admitting: Cardiovascular Disease

## 2018-10-31 NOTE — Telephone Encounter (Signed)
Patient would like to send his EKG to Dr. Rockey Situ . Please call and advise where for patient to send

## 2018-11-03 NOTE — Telephone Encounter (Signed)
I spoke with the patient. He is aware that MyChart is the best way to send a tracing for Dr. Rockey Situ to review from his Apple watch.  He is having trouble getting this to link up. I advised her may also print and fax them or if he wants to bring them by, he can call us and let us know that.  Per the patient, he feels like things have "settled down" for his heart rhythm with the current medications and will hold off on sending anything at this time.

## 2018-11-06 ENCOUNTER — Other Ambulatory Visit (HOSPITAL_COMMUNITY): Payer: Self-pay | Admitting: Primary Care

## 2018-11-06 DIAGNOSIS — I714 Abdominal aortic aneurysm, without rupture, unspecified: Secondary | ICD-10-CM

## 2018-11-06 DIAGNOSIS — I723 Aneurysm of iliac artery: Secondary | ICD-10-CM

## 2018-11-21 ENCOUNTER — Ambulatory Visit (INDEPENDENT_AMBULATORY_CARE_PROVIDER_SITE_OTHER): Payer: Medicare Other | Admitting: Primary Care

## 2018-11-21 ENCOUNTER — Encounter: Payer: Self-pay | Admitting: Primary Care

## 2018-11-21 DIAGNOSIS — I700 Atherosclerosis of aorta: Secondary | ICD-10-CM

## 2018-11-21 DIAGNOSIS — E782 Mixed hyperlipidemia: Secondary | ICD-10-CM

## 2018-11-21 DIAGNOSIS — R413 Other amnesia: Secondary | ICD-10-CM | POA: Insufficient documentation

## 2018-11-21 DIAGNOSIS — R4189 Other symptoms and signs involving cognitive functions and awareness: Secondary | ICD-10-CM | POA: Insufficient documentation

## 2018-11-21 MED ORDER — ATORVASTATIN CALCIUM 20 MG PO TABS: 20.0000 mg | ORAL_TABLET | Freq: Every day | ORAL | 3 refills | Status: DC

## 2018-11-21 NOTE — Assessment & Plan Note (Signed)
Chronic which dates back about 2 to 3 years ago, progressing since.  He is alert and oriented today during exam, answers questions appropriately.  Based off of HPI it is quite possible that he may have early dementia that is likely vascular in cause given his history of hyperlipidemia/tobacco abuse, etc.  Discussed options for treatment including lifestyle changes, prescription medication, neurology evaluation.   I strongly advised that he avoid watching TV all day and work to exercise his mind with puzzles/games/reading/exercise/healthy diet.  We discussed the role of aortic atherosclerosis/hyperlipidemia/tobacco abuse in terms of potential cause for vascular type dementia which would cause memory changes.  He does agree to statin therapy and given his history this is needed.  We will start atorvastatin today, he and his wife will research medications for memory including Aricept and Namenda.  They will give back with me within 2 to 3 weeks regarding their decision.  They currently decline neurology evaluation today.

## 2018-11-21 NOTE — Assessment & Plan Note (Signed)
Does finally agreed to statin therapy, discussed how atherosclerosis can play a role in vascular dementia.  Prescription for atorvastatin 20 mg sent to pharmacy.  Check lipids and LFTs in 6 weeks.

## 2018-11-21 NOTE — Progress Notes (Signed)
Subjective:    Patient ID: Adam Juarez, male    DOB: February 13, 1941, 78 y.o.   MRN: [id number]  HPI  Virtual Visit via Video Note  I connected with [name] on 11/21/18 at 11:00 AM EDT by a video enabled telemedicine application and verified that I am speaking with the correct person using two identifiers.  Location: Patient: Home Provider: Office   I discussed the limitations of evaluation and management by telemedicine and the availability of in person appointments. The patient expressed understanding and agreed to proceed.  We attempted to connect via video and we could not connect. We had to conduct our visit via phone which lasted 21 min 27 sec.  History of Present Illness:  Adam Juarez is a 78 year old male with a history of paroxysmal atrial fibrillation, AAA, PAD, SVT, hyperlipidemia, prediabetes, chronic fatigue who presents today with his wife with reports of memory changes.  Both he and his wife are concerned about his memory and comprehension.  He endorses that he's easily confused, difficulty comprehending things, and is forgetful, easily anxious/panicked. For example cannot handle how to get ready for his virtual appointment while handling a separate e-mail from another individual.   His wife also notices the same things, but also reports repetitive questioning within hours or the following day. Will also lose his train of thought, sometimes difficulty thinking of a word, startles easily.   Both he and his wife have noticed a gradual progression of symptoms that date back to 2-3 years ago. He is sedentary and watches TV nearly all day everyday. He doesn't exercise, work puzzles, go outdoors much.  He has a long history of hyperlipidemia, tobacco abuse, atrial fibrillation.  He is not on statin therapy as he has refused numerous times in the past.  He denies unilateral weakness, acute changes in speech, dizziness.   Observations/Objective:  Alert and oriented. No  distress. Speaking in complete sentences.   Assessment and Plan:  See problem based charting.  Follow Up Instructions:  Start atorvastatin 20 mg once daily for cholesterol and heart protection.  Please schedule a lab only appointment for 6 weeks to repeat your cholesterol.  Consider donepezil or memantine medications for memory as discussed.  Be sure to exercise your mind with puzzles/games/exercise and avoid consistent TV use.  It was a pleasure to see you today!     I discussed the assessment and treatment plan with the patient. The patient was provided an opportunity to ask questions and all were answered. The patient agreed with the plan and demonstrated an understanding of the instructions.   The patient was advised to call back or seek an in-person evaluation if the symptoms worsen or if the condition fails to improve as anticipated.    Pleas Koch, NP    Review of Systems  Respiratory: Negative for shortness of breath.   Cardiovascular: Negative for chest pain.  Neurological: Negative for dizziness, speech difficulty, weakness and headaches.       Memory changes       Past Medical History:  Diagnosis Date  . Arthritis    fingers  . Cancer (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 Chicken pox   . Dysrhythmia 09/2013   Hx. a-fib x 1 episode patient states he "auto corrected" seen at [facility]  . GERD (gastroesophageal reflux disease)   . Headache    poor posture - none recently  . PSVT (paroxysmal supraventricular tachycardia) ([place]) 2009  Controlled with "breathing process"  . Renal stone 9/08, 6/15  . Wears dentures    full upper     Social History   Socioeconomic History  . Marital status: Married    Spouse name: Not on file  . Number of children: Not on file  . Years of education: Not on file  . Highest education level: Not on file  Occupational History  . Not on file  Social Needs  . Financial resource strain: Not on file  .  Food insecurity    Worry: Not on file    Inability: Not on file  . Transportation needs    Medical: Not on file    Non-medical: Not on file  Tobacco Use  . Smoking status: Former Smoker    Packs/day: 0.50    Years: 38.00    Pack years: 19.00    Types: Cigarettes    Quit date: 07/13/1997    Years since quitting: 21.3  . Smokeless tobacco: Never Used  Substance and Sexual Activity  . Alcohol use: No  . Drug use: No  . Sexual activity: Not on file  Lifestyle  . Physical activity    Days per week: Not on file    Minutes per session: Not on file  . Stress: Not on file  Relationships  . Social Herbalist on phone: Not on file    Gets together: Not on file    Attends religious service: Not on file    Active member of club or organization: Not on file    Attends meetings of clubs or organizations: Not on file    Relationship status: Not on file  . Intimate partner violence    Fear of current or ex partner: Not on file    Emotionally abused: Not on file    Physically abused: Not on file    Forced sexual activity: Not on file  Other Topics Concern  . Not on file  Social History Narrative  . Not on file    Past Surgical History:  Procedure Laterality Date  . CARDIAC CATHETERIZATION  [address] [place]  . CATARACT EXTRACTION W/PHACO Left 07/27/2015   Procedure: CATARACT EXTRACTION PHACO AND INTRAOCULAR LENS PLACEMENT (IOC);  Surgeon: Leandrew Koyanagi, MD;  Location: [facility];  Service: Ophthalmology;  Laterality: Left;  TORIC  . CATARACT EXTRACTION W/PHACO Right 08/24/2015   Procedure: CATARACT EXTRACTION PHACO AND INTRAOCULAR LENS PLACEMENT (IOC);  Surgeon: Leandrew Koyanagi, MD;  Location: [facility];  Service: Ophthalmology;  Laterality: Right;  TORIC  . HEMORROIDECTOMY  2010   banding  . KIDNEY STONE SURGERY  9/08, 6/15   lithotripsy  . PROSTATE BIOPSY  2007  . SKIN CANCER EXCISION    . TOENAIL TRIMMING  11/2017   Dr. Elvina Mattes  .  VASECTOMY      Family History  Problem Relation Age of Onset  . AAA (abdominal aortic aneurysm) Father   . Parkinson's disease Mother   . Arthritis Mother     Allergies  Allergen Reactions  . Other Anaphylaxis and Hives    BEE STINGS BEE STINGS BEE STINGS    Current Outpatient Medications on File Prior to Visit  Medication Sig Dispense Refill  . Coenzyme Q10 (COQ10) 200 MG CAPS Take 200 mg by mouth daily.    Marland Kitchen diltiazem (CARDIZEM) 30 MG tablet Take 1 tablet (30 mg total) by mouth 3 (three) times daily as needed (As needed for tachycardia). 90 tablet 3  .  Magnesium 500 MG CAPS Take 500 mg by mouth daily.    . metoprolol succinate (TOPROL XL) 25 MG 24 hr tablet Take 1 tablet (25 mg total) by mouth 2 (two) times a day. 180 tablet 3  . Misc Natural Products (GLUCOSAMINE CHONDROITIN ADV PO) Take 1 tablet by mouth daily.    . Multiple Vitamins-Minerals (PRESERVISION AREDS 2 PO) Take by mouth 2 (two) times daily.    . Nutritional Supplements (DHEA PO) Take by mouth.    Marland Kitchen omeprazole (PRILOSEC) 20 MG capsule Take 1 capsule (20 mg total) by mouth daily. 90 capsule 1  . ondansetron (ZOFRAN ODT) 4 MG disintegrating tablet Take 1 tablet (4 mg total) by mouth every 8 (eight) hours as needed for nausea or vomiting. 20 tablet 0  . oxyCODONE (ROXICODONE) 5 MG immediate release tablet Take 1 tablet (5 mg total) by mouth every 8 (eight) hours as needed. 20 tablet 0  . Polyethyl Glycol-Propyl Glycol (SYSTANE OP) Apply 1 drop to eye 3 (three) times daily.    . Probiotic Product (PROBIOTIC DAILY PO) Take by mouth.    . rivaroxaban (XARELTO) 20 MG TABS tablet TAKE 1 TABLET DAILY WITH SUPPER 90 tablet 1  . tiZANidine (ZANAFLEX) 4 MG tablet Take 1 tablet (4 mg total) by mouth at bedtime as needed and may repeat dose one time if needed for muscle spasms. 30 tablet 0   No current facility-administered medications on file prior to visit.     There were no vitals taken for this visit.   Objective:    Physical Exam  Constitutional: He is oriented to person, place, and time.  Respiratory: Effort normal.  Neurological: He is alert and oriented to person, place, and time.  Answers all questions appropriately  Psychiatric: He has a normal mood and affect.           Assessment & Plan:

## 2018-11-21 NOTE — Assessment & Plan Note (Signed)
Agrees to statin therapy.  Prescription for atorvastatin 20 mg sent to pharmacy.  Check lipids and LFTs in 6 weeks.

## 2018-11-21 NOTE — Patient Instructions (Signed)
Start atorvastatin 20 mg once daily for cholesterol and heart protection.  Please schedule a lab only appointment for 6 weeks to repeat your cholesterol.  Consider donepezil or memantine medications for memory as discussed.  Be sure to exercise your mind with puzzles/games/exercise and avoid consistent TV use.  It was a pleasure to see you today!

## 2018-12-01 ENCOUNTER — Ambulatory Visit (INDEPENDENT_AMBULATORY_CARE_PROVIDER_SITE_OTHER)
Admission: RE | Admit: 2018-12-01 | Discharge: 2018-12-01 | Disposition: A | Discharge status: Home / Self Care | Payer: Medicare Other | Source: Ambulatory Visit | Attending: Family Medicine | Admitting: Family Medicine

## 2018-12-01 ENCOUNTER — Ambulatory Visit (INDEPENDENT_AMBULATORY_CARE_PROVIDER_SITE_OTHER): Payer: Medicare Other | Admitting: Family Medicine

## 2018-12-01 ENCOUNTER — Other Ambulatory Visit: Payer: Self-pay

## 2018-12-01 ENCOUNTER — Telehealth: Payer: Self-pay

## 2018-12-01 ENCOUNTER — Encounter: Payer: Self-pay | Admitting: Family Medicine

## 2018-12-01 VITALS — BP 118/68 | HR 84 | Temp 98.8°F | Ht 69.5 in | Wt 205.2 lb

## 2018-12-01 DIAGNOSIS — Z7901 Long term (current) use of anticoagulants: Secondary | ICD-10-CM | POA: Diagnosis not present

## 2018-12-01 DIAGNOSIS — R109 Unspecified abdominal pain: Secondary | ICD-10-CM

## 2018-12-01 DIAGNOSIS — K5900 Constipation, unspecified: Secondary | ICD-10-CM | POA: Diagnosis not present

## 2018-12-01 LAB — POC URINALSYSI DIPSTICK (AUTOMATED)
Bilirubin, UA: NEGATIVE
Blood, UA: NEGATIVE
Glucose, UA: NEGATIVE
Ketones, UA: NEGATIVE
Nitrite, UA: NEGATIVE
Protein, UA: NEGATIVE
Spec Grav, UA: 1.02 (ref 1.010–1.025)
Urobilinogen, UA: 0.2 E.U./dL
pH, UA: 5 (ref 5.0–8.0)

## 2018-12-01 LAB — CBC WITH DIFFERENTIAL/PLATELET
Basophils Absolute: 0.1 10*3/uL (ref 0.0–0.1)
Basophils Relative: 1 % (ref 0.0–3.0)
Eosinophils Absolute: 0.2 10*3/uL (ref 0.0–0.7)
Eosinophils Relative: 3.6 % (ref 0.0–5.0)
HCT: 44.3 % (ref 39.0–52.0)
Hemoglobin: 15 g/dL (ref 13.0–17.0)
Lymphocytes Relative: 23.5 % (ref 12.0–46.0)
Lymphs Abs: 1.2 10*3/uL (ref 0.7–4.0)
MCHC: 33.8 g/dL (ref 30.0–36.0)
MCV: 97.7 fl (ref 78.0–100.0)
Monocytes Absolute: 0.9 10*3/uL (ref 0.1–1.0)
Monocytes Relative: 17.5 % — ABNORMAL HIGH (ref 3.0–12.0)
Neutro Abs: 2.8 10*3/uL (ref 1.4–7.7)
Neutrophils Relative %: 54.4 % (ref 43.0–77.0)
Platelets: 155 10*3/uL (ref 150.0–400.0)
RBC: 4.53 Mil/uL (ref 4.22–5.81)
RDW: 13.6 % (ref 11.5–15.5)
WBC: 5.1 10*3/uL (ref 4.0–10.5)

## 2018-12-01 NOTE — Telephone Encounter (Signed)
Noted  

## 2018-12-01 NOTE — Telephone Encounter (Signed)
Since 11/27/18 pt has dull achy pain (Pain level 4) in lt side; feels constipated,has small BM daily and pt is eating basically same amt of food as usual. Pt is slow to start to urinate, then usually urinates the normal amt once gets started urinating. No fever, chills, S/T,cough, muscle pain,SOB,diarrhea, H/A and no loss of taste or smell. No travel and no known exposure to covid. Pt scheduled appt 12/01/18 at 10:30 with Glenda Chroman FNP. ED precautions given.

## 2018-12-01 NOTE — Progress Notes (Signed)
Subjective:    Patient ID: Adam Juarez, male    DOB: 01-31-1941, 78 y.o.   MRN: [id number]  HPI This is a 78 yo male who presents today with complaint of left flank pain x 4 days and chronic constipation. Left sided pain x 4 days, achy and constant. Has taken some Tylenol with improvement. Sleeping well. No urinary frequency, dysuria, hematuria. Has had decreased volume and frequency of bowel movements. This is unusual for him as he usually has up to 4 a day. Took two dulcolax 2 days ago and had a dark bm, normal color following, no blood or mucus. Feels mildly bloated, no nausea or vomiting. Drinks 16-20 ounces of water, a can of Coke daily, no other fluids. Slightly reduced appetite, not sure if this is "psychological" knowing that he has not had good bowel movement. History of left kidney stone in past, did not feel like this. No history of diverticulitis. No fever/chills.   Past Medical History:  Diagnosis Date  . Arthritis    fingers  . 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 Chicken pox   . Dysrhythmia 09/2013   Hx. a-fib x 1 episode patient states he "auto corrected" seen at [facility]  . GERD (gastroesophageal reflux disease)   . Headache    poor posture - none recently  . PSVT (paroxysmal supraventricular tachycardia) ([place]) 2009   Controlled with "breathing process"  . Renal stone 9/08, 6/15  . Wears dentures    full upper   Past Surgical History:  Procedure Laterality Date  . CARDIAC CATHETERIZATION  [address] [place]  . CATARACT EXTRACTION W/PHACO Left 07/27/2015   Procedure: CATARACT EXTRACTION PHACO AND INTRAOCULAR LENS PLACEMENT (IOC);  Surgeon: Leandrew Koyanagi, MD;  Location: [facility];  Service: Ophthalmology;  Laterality: Left;  TORIC  . CATARACT EXTRACTION W/PHACO Right 08/24/2015   Procedure: CATARACT EXTRACTION PHACO AND INTRAOCULAR LENS PLACEMENT (IOC);  Surgeon: Leandrew Koyanagi, MD;  Location: [facility];  Service:  Ophthalmology;  Laterality: Right;  TORIC  . HEMORROIDECTOMY  2010   banding  . KIDNEY STONE SURGERY  9/08, 6/15   lithotripsy  . PROSTATE BIOPSY  2007  . SKIN CANCER EXCISION    . TOENAIL TRIMMING  11/2017   Dr. Elvina Mattes  . VASECTOMY     Family History  Problem Relation Age of Onset  . AAA (abdominal aortic aneurysm) Father   . Parkinson's disease Mother   . Arthritis Mother    Social History   Tobacco Use  . Smoking status: Former Smoker    Packs/day: 0.50    Years: 38.00    Pack years: 19.00    Types: Cigarettes    Quit date: 07/13/1997    Years since quitting: 21.4  . Smokeless tobacco: Never Used  Substance Use Topics  . Alcohol use: No  . Drug use: No      Review of Systems Per HPI    Objective:   Physical Exam Constitutional:      General: He is not in acute distress.    Appearance: Normal appearance. He is normal weight. He is not ill-appearing, toxic-appearing or diaphoretic.  HENT:     Head: Normocephalic and atraumatic.  Eyes:     Conjunctiva/sclera: Conjunctivae normal.  Cardiovascular:     Rate and Rhythm: Normal rate and regular rhythm.     Heart sounds: Normal heart sounds.  Pulmonary:     Effort: Pulmonary effort is normal.  Breath sounds: Normal breath sounds.  Abdominal:     General: Abdomen is flat. Bowel sounds are normal. There is no distension.     Palpations: Abdomen is soft. There is no mass.     Tenderness: There is abdominal tenderness (mild left sided). There is no guarding or rebound.     Hernia: No hernia is present.  Neurological:     Mental Status: He is alert and oriented to person, place, and time.  Psychiatric:        Mood and Affect: Mood normal.        Behavior: Behavior normal.        Thought Content: Thought content normal.        Judgment: Judgment normal.       BP 118/68 (BP Location: Left Arm, Patient Position: Sitting, Cuff Size: Normal)   Pulse 84   Temp 98.8 F (37.1 C) (Temporal)   Ht 5' 9.5" (1.765  m)   Wt 205 lb 4 oz (93.1 kg)   SpO2 94%   BMI 29.88 kg/m  Wt Readings from Last 3 Encounters:  12/01/18 205 lb 4 oz (93.1 kg)  06/20/18 201 lb 14.4 oz (91.6 kg)  06/07/18 200 lb (90.7 kg)       Assessment & Plan:  1. Left sided abdominal pain - likely related to constipation, will check labs and KUB - POCT Urinalysis Dipstick (Automated)- trace leukocytes, otherwise normal - Urine Culture  2. Constipation, unspecified constipation type - he was instructed on laxative/miralax for acute constipation as well as maintenance meds for chronic constipation - DG Abd 1 View; Future  3. Chronic anticoagulation - CBC with Differential   Clarene Reamer, FNP-BC  [facility]  12/06/2018 8:03 AM

## 2018-12-01 NOTE — Patient Instructions (Addendum)
Good to see you today  For acute constipation- Take 2 Dulcolax. Wait 1 hour then take 2-4 doses of Miralax over 2-3 hours For maintenance use Colace (stool softener, generic fine) and Miralax 1-3 doses daily until you are regulated If you do not have adequate bowel movement in 24 hours, please let me know.   I am sending your urine for a culture and will let you know if there is any bacterial growth.

## 2018-12-03 LAB — URINE CULTURE
MICRO NUMBER:: 683702
Result:: NO GROWTH
SPECIMEN QUALITY:: ADEQUATE

## 2018-12-14 ENCOUNTER — Other Ambulatory Visit: Payer: Self-pay | Admitting: Primary Care

## 2019-01-27 DIAGNOSIS — H353132 Nonexudative age-related macular degeneration, bilateral, intermediate dry stage: Secondary | ICD-10-CM | POA: Diagnosis not present

## 2019-01-30 ENCOUNTER — Other Ambulatory Visit: Payer: Self-pay

## 2019-01-30 ENCOUNTER — Other Ambulatory Visit (INDEPENDENT_AMBULATORY_CARE_PROVIDER_SITE_OTHER): Payer: Medicare Other

## 2019-01-30 DIAGNOSIS — I700 Atherosclerosis of aorta: Secondary | ICD-10-CM | POA: Diagnosis not present

## 2019-01-30 DIAGNOSIS — E782 Mixed hyperlipidemia: Secondary | ICD-10-CM | POA: Diagnosis not present

## 2019-01-30 LAB — LIPID PANEL
Cholesterol: 123 mg/dL (ref 0–200)
HDL: 31.5 mg/dL — ABNORMAL LOW (ref >39.00)
NonHDL: 91.63
Total CHOL/HDL Ratio: 4
Triglycerides: 293 mg/dL — ABNORMAL HIGH (ref 0.0–149.0)
VLDL: 58.6 mg/dL — ABNORMAL HIGH (ref 0.0–40.0)

## 2019-01-30 LAB — HEPATIC FUNCTION PANEL
ALT: 16 U/L (ref 0–53)
AST: 20 U/L (ref 0–37)
Albumin: 4.4 g/dL (ref 3.5–5.2)
Alkaline Phosphatase: 58 U/L (ref 39–117)
Bilirubin, Direct: 0.2 mg/dL (ref 0.0–0.3)
Total Bilirubin: 1 mg/dL (ref 0.2–1.2)
Total Protein: 6.9 g/dL (ref 6.0–8.3)

## 2019-01-30 LAB — LDL CHOLESTEROL, DIRECT: Direct LDL: 42 mg/dL

## 2019-02-05 ENCOUNTER — Telehealth: Payer: Self-pay

## 2019-02-05 ENCOUNTER — Ambulatory Visit: Payer: Medicare Other

## 2019-02-05 NOTE — Telephone Encounter (Signed)
Called patient to complete Medicare Wellness visit. Called patient a total of 3 times, 2:40 pm, 2:50 pm and 3 pm. Patient never answered and I left voicemail messages all 3 times. Advised patient on the third and final call that the provider may complete his Medicare visit at his upcoming physical and if not we will reschedule him at that time.

## 2019-02-06 ENCOUNTER — Other Ambulatory Visit: Payer: Self-pay

## 2019-02-06 ENCOUNTER — Ambulatory Visit (INDEPENDENT_AMBULATORY_CARE_PROVIDER_SITE_OTHER): Payer: Medicare Other | Admitting: Primary Care

## 2019-02-06 ENCOUNTER — Ambulatory Visit: Payer: Medicare Other

## 2019-02-06 VITALS — BP 116/70 | HR 70 | Temp 97.8°F | Ht 69.5 in | Wt 212.8 lb

## 2019-02-06 DIAGNOSIS — I480 Paroxysmal atrial fibrillation: Secondary | ICD-10-CM

## 2019-02-06 DIAGNOSIS — K219 Gastro-esophageal reflux disease without esophagitis: Secondary | ICD-10-CM

## 2019-02-06 DIAGNOSIS — M79673 Pain in unspecified foot: Secondary | ICD-10-CM | POA: Diagnosis not present

## 2019-02-06 DIAGNOSIS — I471 Supraventricular tachycardia, unspecified: Secondary | ICD-10-CM

## 2019-02-06 DIAGNOSIS — R413 Other amnesia: Secondary | ICD-10-CM

## 2019-02-06 DIAGNOSIS — Z87898 Personal history of other specified conditions: Secondary | ICD-10-CM | POA: Diagnosis not present

## 2019-02-06 DIAGNOSIS — I714 Abdominal aortic aneurysm, without rupture, unspecified: Secondary | ICD-10-CM

## 2019-02-06 DIAGNOSIS — M546 Pain in thoracic spine: Secondary | ICD-10-CM

## 2019-02-06 DIAGNOSIS — I700 Atherosclerosis of aorta: Secondary | ICD-10-CM | POA: Diagnosis not present

## 2019-02-06 DIAGNOSIS — Z0001 Encounter for general adult medical examination with abnormal findings: Secondary | ICD-10-CM

## 2019-02-06 DIAGNOSIS — M545 Low back pain: Secondary | ICD-10-CM | POA: Diagnosis not present

## 2019-02-06 DIAGNOSIS — Z23 Encounter for immunization: Secondary | ICD-10-CM

## 2019-02-06 DIAGNOSIS — G8929 Other chronic pain: Secondary | ICD-10-CM

## 2019-02-06 DIAGNOSIS — M25512 Pain in left shoulder: Secondary | ICD-10-CM

## 2019-02-06 DIAGNOSIS — Z0000 Encounter for general adult medical examination without abnormal findings: Secondary | ICD-10-CM | POA: Insufficient documentation

## 2019-02-06 DIAGNOSIS — E782 Mixed hyperlipidemia: Secondary | ICD-10-CM | POA: Diagnosis not present

## 2019-02-06 NOTE — Assessment & Plan Note (Signed)
Chronic and intermittent, more frequent flares. Taking Tylenol with some improvement.  Referral placed to physical therapy for evaluation and treatment.

## 2019-02-06 NOTE — Assessment & Plan Note (Signed)
Alert and oriented today during visit, answers questions appropriately. Encouraged reading, crossword puzzles and advised he avoid TV all day.

## 2019-02-06 NOTE — Assessment & Plan Note (Signed)
Immunizations up-to-date.  Influenza vaccination provided today.  Patient declines further testing with PSA, has had prostate biopsy in the past.  He has out aged colonoscopy.  Strongly advise he start working on exercise, work on improving diet.  Exam today stable.  Lab results reviewed and also pending.  All recommendations provided at visit.   I have personally reviewed and have noted: 1. The patient's medical and social history 2. Their use of alcohol, tobacco or illicit drugs 3. Their current medications and supplements 4. The patient's functional ability including ADL's, fall risks, home  safety risks and hearing or visual impairment. 5. Diet and physical activities 6. Evidence for depression or mood disorder

## 2019-02-06 NOTE — Assessment & Plan Note (Signed)
No recent episodes per patient. Continue diltiazem and metoprolol.

## 2019-02-06 NOTE — Assessment & Plan Note (Signed)
Overall improved.  

## 2019-02-06 NOTE — Assessment & Plan Note (Signed)
Repeat vascular ultrasound pending. He will call to schedule. Following with cardiology as well. Continue good BP control.

## 2019-02-06 NOTE — Assessment & Plan Note (Signed)
LDL at goal on statin therapy. Recommended regular exercise and healthy diet.

## 2019-02-06 NOTE — Assessment & Plan Note (Addendum)
Reduction in triglycerides with atorvastatin initiation. LDL at goal.  Continue atorvastatin.

## 2019-02-06 NOTE — Assessment & Plan Note (Signed)
Doing well on daily omeprazole, continue same. 

## 2019-02-06 NOTE — Progress Notes (Signed)
Patient ID: Adam Juarez, male   DOB: 07/11/1940, 78 y.o.   MRN: [id number]  Mr. Troupe is a 78 year old male who presents today for MWV and follow-up of chronic conditions.  He would also like a referral to physical therapy.  HPI:  Past Medical History:  Diagnosis Date  . Arthritis    fingers  . 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 Chicken pox   . Dysrhythmia 09/2013   Hx. a-fib x 1 episode patient states he "auto corrected" seen at [facility]  . GERD (gastroesophageal reflux disease)   . Headache    poor posture - none recently  . PSVT (paroxysmal supraventricular tachycardia) ([place]) 2009   Controlled with "breathing process"  . Renal stone 9/08, 6/15  . Wears dentures    full upper    Current Outpatient Medications  Medication Sig Dispense Refill  . atorvastatin (LIPITOR) 20 MG tablet Take 1 tablet (20 mg total) by mouth daily. For cholesterol. 90 tablet 3  . Coenzyme Q10 (COQ10) 200 MG CAPS Take 200 mg by mouth daily.    Marland Kitchen diltiazem (CARDIZEM) 30 MG tablet Take 1 tablet (30 mg total) by mouth 3 (three) times daily as needed (As needed for tachycardia). 90 tablet 3  . Magnesium 500 MG CAPS Take 500 mg by mouth daily.    . metoprolol succinate (TOPROL XL) 25 MG 24 hr tablet Take 1 tablet (25 mg total) by mouth 2 (two) times a day. 180 tablet 3  . Misc Natural Products (GLUCOSAMINE CHONDROITIN ADV PO) Take 1 tablet by mouth daily.    . Multiple Vitamins-Minerals (PRESERVISION AREDS 2 PO) Take by mouth 2 (two) times daily.    . Nutritional Supplements (DHEA PO) Take by mouth.    Marland Kitchen omeprazole (PRILOSEC) 20 MG capsule TAKE 1 CAPSULE DAILY 90 capsule 1  . ondansetron (ZOFRAN ODT) 4 MG disintegrating tablet Take 1 tablet (4 mg total) by mouth every 8 (eight) hours as needed for nausea or vomiting. 20 tablet 0  . oxyCODONE (ROXICODONE) 5 MG immediate release tablet Take 1 tablet (5 mg total) by mouth every 8 (eight) hours as needed. 20 tablet 0  . Polyethyl  Glycol-Propyl Glycol (SYSTANE OP) Apply 1 drop to eye 3 (three) times daily.    . Probiotic Product (PROBIOTIC DAILY PO) Take by mouth.    . rivaroxaban (XARELTO) 20 MG TABS tablet TAKE 1 TABLET DAILY WITH SUPPER 90 tablet 1  . tiZANidine (ZANAFLEX) 4 MG tablet Take 1 tablet (4 mg total) by mouth at bedtime as needed and may repeat dose one time if needed for muscle spasms. 30 tablet 0   No current facility-administered medications for this visit.     Allergies  Allergen Reactions  . Other Anaphylaxis and Hives    BEE STINGS BEE STINGS BEE STINGS    Family History  Problem Relation Age of Onset  . AAA (abdominal aortic aneurysm) Father   . Parkinson's disease Mother   . Arthritis Mother     Social History   Socioeconomic History  . Marital status: Married    Spouse name: Not on file  . Number of children: Not on file  . Years of education: Not on file  . Highest education level: Not on file  Occupational History  . Not on file  Social Needs  . Financial resource strain: Not on file  . Food insecurity    Worry: Not on file    Inability: Not on  file  . Transportation needs    Medical: Not on file    Non-medical: Not on file  Tobacco Use  . Smoking status: Former Smoker    Packs/day: 0.50    Years: 38.00    Pack years: 19.00    Types: Cigarettes    Quit date: 07/13/1997    Years since quitting: 21.5  . Smokeless tobacco: Never Used  Substance and Sexual Activity  . Alcohol use: No  . Drug use: No  . Sexual activity: Not on file  Lifestyle  . Physical activity    Days per week: Not on file    Minutes per session: Not on file  . Stress: Not on file  Relationships  . Social Herbalist on phone: Not on file    Gets together: Not on file    Attends religious service: Not on file    Active member of club or organization: Not on file    Attends meetings of clubs or organizations: Not on file    Relationship status: Not on file  . Intimate partner  violence    Fear of current or ex partner: Not on file    Emotionally abused: Not on file    Physically abused: Not on file    Forced sexual activity: Not on file  Other Topics Concern  . Not on file  Social History Narrative  . Not on file    Hospitiliaztions: None  Health Maintenance:    Flu: Due today  Tetanus: Completed in 2013  Pneumovax: Completed in 2012  Prevnar: Completed in 2018  Zostavax: Completed in 2012  Colonoscopy: Completed several years ago, no need to follow up  given age.  Eye Doctor: Completed in 2020  Dental Exam: Completed in 2020  PSA: 5.26 in 01/2018, 6.54 in April 2019  Providers: Alma Friendly, PCP; Eye Doctor, Dentist; Dermatology; Dr. Rockey Situ   I have personally reviewed and have noted: 1. The patient's medical and social history 2. Their use of alcohol, tobacco or illicit drugs 3. Their current medications and supplements 4. The patient's functional ability including ADL's, fall risks, home  safety risks and hearing or visual impairment. 5. Diet and physical activities 6. Evidence for depression or mood disorder  Subjective:   Review of Systems:   Constitutional: Denies fever, malaise, fatigue, headache or abrupt weight changes.  HEENT: Denies eye pain, eye redness, ear pain, ringing in the ears, wax buildup, runny nose, nasal congestion, bloody nose, or sore throat. Respiratory: Denies difficulty breathing, shortness of breath, cough or sputum production.   Cardiovascular: Denies chest pain, chest tightness, palpitations or swelling in the hands or feet.  Gastrointestinal: Denies abdominal pain, bloating, constipation, diarrhea or blood in the stool.  GU: Denies urgency, frequency, pain with urination, burning sensation, blood in urine, odor or discharge. Musculoskeletal: Chronic arthritis pain.  Skin: Denies redness, rashes, lesions or ulcercations.  Neurological: Denies difficulty with speech or problems with balance and coordination.   Psychiatric: Denies concerns for anxiety or depression.   No other specific complaints in a complete review of systems (except as listed in HPI above).  Objective:  PE:   BP 116/70   Pulse 70   Temp 97.8 F (36.6 C) (Temporal)   Ht 5' 9.5" (1.765 m)   Wt 212 lb 12 oz (96.5 kg)   SpO2 95%   BMI 30.97 kg/m  Wt Readings from Last 3 Encounters:  02/06/19 212 lb 12 oz (96.5 kg)  12/01/18 205 lb  4 oz (93.1 kg)  06/20/18 201 lb 14.4 oz (91.6 kg)    General: Appears their stated age, well developed, well nourished in NAD. Skin: Warm, dry and intact. No rashes, lesions or ulcerations noted. HEENT: Head: normal shape and size; Eyes: sclera white, no icterus, conjunctiva pink, PERRLA and EOMs intact; Ears: Tm's gray and intact, normal light reflex; Nose: mucosa pink and moist, septum midline; Throat/Mouth: Teeth present, mucosa pink and moist, no exudate, lesions or ulcerations noted.  Neck: Normal range of motion. Neck supple, trachea midline. No massses, lumps or thyromegaly present.  Cardiovascular: Normal rate and rhythm. S1,S2 noted.  No murmur, rubs or gallops noted. No JVD or BLE edema. No carotid bruits noted. Pulmonary/Chest: Normal effort and positive vesicular breath sounds. No respiratory distress. No wheezes, rales or ronchi noted.  Abdomen: Soft and nontender. Normal bowel sounds, no bruits noted. No distention or masses noted. Liver, spleen and kidneys non palpable. Musculoskeletal: Normal range of motion. No signs of joint swelling. No difficulty with gait.  Neurological: Alert and oriented. Cranial nerves II-XII intact. Coordination normal. +DTRs bilaterally. Psychiatric: Mood and affect normal. Behavior is normal. Judgment and thought content normal.    BMET    Component Value Date/Time   NA 140 01/23/2018 0819   NA 137 10/13/2013 1844   K 4.1 01/23/2018 0819   K 4.1 10/13/2013 1844   CL 103 01/23/2018 0819   CL 106 10/13/2013 1844   CO2 28 01/23/2018 0819   CO2  25 10/13/2013 1844   GLUCOSE 108 (H) 01/23/2018 0819   GLUCOSE 83 10/13/2013 1844   BUN 25 (H) 01/23/2018 0819   BUN 24 (H) 10/13/2013 1844   CREATININE 0.94 01/23/2018 0819   CREATININE 1.16 10/13/2013 1844   CALCIUM 9.8 01/23/2018 0819   CALCIUM 9.2 10/13/2013 1844   GFRNONAA >60 10/13/2013 1844   GFRAA >60 10/13/2013 1844    Lipid Panel     Component Value Date/Time   CHOL 123 01/30/2019 0734   TRIG 293.0 (H) 01/30/2019 0734   HDL 31.50 (L) 01/30/2019 0734   CHOLHDL 4 01/30/2019 0734   VLDL 58.6 (H) 01/30/2019 0734    CBC    Component Value Date/Time   WBC 5.1 12/01/2018 1115   RBC 4.53 12/01/2018 1115   HGB 15.0 12/01/2018 1115   HGB 15.4 10/13/2013 1844   HCT 44.3 12/01/2018 1115   HCT 45.2 10/13/2013 1844   PLT 155.0 12/01/2018 1115   PLT 156 10/13/2013 1844   MCV 97.7 12/01/2018 1115   MCV 94 10/13/2013 1844   MCH 32.2 10/13/2013 1844   MCHC 33.8 12/01/2018 1115   RDW 13.6 12/01/2018 1115   RDW 13.6 10/13/2013 1844   LYMPHSABS 1.2 12/01/2018 1115   MONOABS 0.9 12/01/2018 1115   EOSABS 0.2 12/01/2018 1115   BASOSABS 0.1 12/01/2018 1115    Hgb A1C Lab Results  Component Value Date   HGBA1C 5.7 01/23/2018      Assessment and Plan:   Medicare Annual Wellness Visit:  Diet: Endorses a fair diet. Trying to eat a healthy diet.  Physical activity: Sedentary Depression/mood screen: Negative Hearing: Intact to whispered voice Visual acuity: Grossly normal, performs annual eye exam  ADLs: Capable Fall risk: None Home safety: Good Cognitive evaluation: Intact to orientation, naming, recall and repetition EOL planning: Adv directives, full code/ I agree  Preventative Medicine: Immunizations up-to-date.  Influenza vaccination provided today.  Patient declines further testing with PSA, has had prostate biopsy in the past.  He  has out aged colonoscopy.  Strongly advise he start working on exercise, work on improving diet.  Exam today stable.  Lab results  reviewed and also pending.  All recommendations provided at visit.  Next appointment: 1 year.

## 2019-02-06 NOTE — Patient Instructions (Addendum)
Start exercising. You should be getting 150 minutes of exercise weekly. Start with walking 5 minutes daily, increase by 2 minutes every several days when possible.   It's important to improve your diet by reducing consumption of fast food, fried food, processed snack foods, sugary drinks. Increase consumption of fresh vegetables and fruits, whole grains, water.  Ensure you are drinking 64 ounces of water daily.  You will be contacted regarding your referral to physical therapy.  Please let us know if you have not been contacted within one week.   Consider gabapentin medication for pain as discussed.  Call to schedule your ultrasound as discussed.   Follow up with Dr. Rockey Situ as discussed.  It was a pleasure to see you today!

## 2019-02-06 NOTE — Assessment & Plan Note (Signed)
Rate and rhythm regular today. Continue Xarelto daily.

## 2019-02-06 NOTE — Assessment & Plan Note (Signed)
Continued pain, has not followed up with podiatry.  Discussed the option for gabapentin, he would like to think about this and get back to me.

## 2019-02-06 NOTE — Assessment & Plan Note (Signed)
Patient declines further work up on PSA this year given age. Will repeat next year.

## 2019-02-07 LAB — COMPREHENSIVE METABOLIC PANEL
AG Ratio: 2 (calc) (ref 1.0–2.5)
ALT: 17 U/L (ref 9–46)
AST: 25 U/L (ref 10–35)
Albumin: 4.5 g/dL (ref 3.6–5.1)
Alkaline phosphatase (APISO): 62 U/L (ref 35–144)
BUN: 21 mg/dL (ref 7–25)
CO2: 22 mmol/L (ref 20–32)
Calcium: 9.5 mg/dL (ref 8.6–10.3)
Chloride: 107 mmol/L (ref 98–110)
Creat: 0.85 mg/dL (ref 0.70–1.18)
Globulin: 2.2 g/dL (calc) (ref 1.9–3.7)
Glucose, Bld: 99 mg/dL (ref 65–99)
Potassium: 4.1 mmol/L (ref 3.5–5.3)
Sodium: 142 mmol/L (ref 135–146)
Total Bilirubin: 0.9 mg/dL (ref 0.2–1.2)
Total Protein: 6.7 g/dL (ref 6.1–8.1)

## 2019-02-07 LAB — CBC
HCT: 41.9 % (ref 38.5–50.0)
Hemoglobin: 15.2 g/dL (ref 13.2–17.1)
MCH: 34.4 pg — ABNORMAL HIGH (ref 27.0–33.0)
MCHC: 36.3 g/dL — ABNORMAL HIGH (ref 32.0–36.0)
MCV: 94.8 fL (ref 80.0–100.0)
MPV: 10.9 fL (ref 7.5–12.5)
Platelets: 151 10*3/uL (ref 140–400)
RBC: 4.42 10*6/uL (ref 4.20–5.80)
RDW: 13.5 % (ref 11.0–15.0)
WBC: 4.8 10*3/uL (ref 3.8–10.8)

## 2019-02-12 ENCOUNTER — Telehealth: Payer: Self-pay | Admitting: Cardiovascular Disease

## 2019-02-12 ENCOUNTER — Other Ambulatory Visit: Payer: Self-pay

## 2019-02-12 MED ORDER — DILTIAZEM HCL 30 MG PO TABS: 30.0000 mg | ORAL_TABLET | Freq: Three times a day (TID) | ORAL | 3 refills | Status: DC | PRN

## 2019-02-12 NOTE — Telephone Encounter (Signed)
Call to patient. He reports 2 episodes yesterday of chest tightness lasted about 1 min and were 25 min apart. "I cant do anything when it is going on". Hx of a fib, feels similar.   He started taking PRN diltiazem last night and continued taking today. Refill sent to pt preferred pharmacy.   Pt is on xarelto. Home HR 72. He has not been able to capture HR during episodes bc of discomfort he is feeling. No recorded blood pressure values.   Returned call to patient to ask if he would be able to come in for next available appt. Pt agreed to come tomorrow 10/2 with Dr. Garen Lah at 10:20 AM.   I advised if he has new or worsening sx that he should seek urgent medical attention.   Advised pt to call for any further questions or concerns.

## 2019-02-12 NOTE — Telephone Encounter (Signed)
Attempted to call patient. LMTCB 02/12/2019

## 2019-02-12 NOTE — Telephone Encounter (Signed)
Patient is returning your call.  

## 2019-02-12 NOTE — Progress Notes (Signed)
Message sent in mychart to make patient aware that request was completed.

## 2019-02-12 NOTE — Telephone Encounter (Signed)
°  Pt c/o Shortness Of Breath: STAT if SOB developed within the last 24 hours or pt is noticeably SOB on the phone  1. Are you currently SOB (can you hear that pt is SOB on the phone)? No  2. How long have you been experiencing SOB? Since last night  3. Are you SOB when sitting or when up moving around? When he is laying down usually. The SOB feeling is random  4. Are you currently experiencing any other symptoms? Chest tightness and overall unwell feeling  Pt c/o of Chest Pain: STAT if CP now or developed within 24 hours  1. Are you having CP right now? no  2. Are you experiencing any other symptoms (ex. SOB, nausea, vomiting, sweating)? sweating  3. How long have you been experiencing CP? Since last night  4. Is your CP continuous or coming and going? Comes and goes like his SOB  5. Have you taken Nitroglycerin?  no ?

## 2019-02-12 NOTE — Telephone Encounter (Signed)
Attempted to contact [place] [facility] at [phone] but no one picked up the phone. Due to the symptoms developing within 24 hours, the call was seen as STAT. The wife will try and reach out to the patient's PCP

## 2019-02-13 ENCOUNTER — Encounter: Payer: Self-pay | Admitting: Cardiology

## 2019-02-13 ENCOUNTER — Ambulatory Visit (INDEPENDENT_AMBULATORY_CARE_PROVIDER_SITE_OTHER): Payer: Medicare Other | Admitting: Cardiology

## 2019-02-13 ENCOUNTER — Other Ambulatory Visit: Payer: Self-pay

## 2019-02-13 VITALS — BP 110/70 | HR 71 | Ht 70.0 in | Wt 212.5 lb

## 2019-02-13 DIAGNOSIS — R0602 Shortness of breath: Secondary | ICD-10-CM | POA: Diagnosis not present

## 2019-02-13 DIAGNOSIS — R079 Chest pain, unspecified: Secondary | ICD-10-CM

## 2019-02-13 NOTE — Patient Instructions (Addendum)
Medication Instructions:  Your physician recommends that you continue on your current medications as directed. Please refer to the Current Medication list given to you today.  If you need a refill on your cardiac medications before your next appointment, please call your pharmacy.   Lab work: None ordered  If you have labs (blood work) drawn today and your tests are completely normal, you will receive your results only by: Marland Kitchen MyChart Message (if you have MyChart) OR . A paper copy in the mail If you have any lab test that is abnormal or we need to change your treatment, we will call you to review the results.  Testing/Procedures: 1- [place]  Your caregiver has ordered a Stress Test with nuclear imaging. The purpose of this test is to evaluate the blood supply to your heart muscle. This procedure is referred to as a "Non-Invasive Stress Test." This is because other than having an IV started in your vein, nothing is inserted or "invades" your body. Cardiac stress tests are done to find areas of poor blood flow to the heart by determining the extent of coronary artery disease (CAD). Some patients exercise on a treadmill, which naturally increases the blood flow to your heart, while others who are  unable to walk on a treadmill due to physical limitations have a pharmacologic/chemical stress agent called Lexiscan . This medicine will mimic walking on a treadmill by temporarily increasing your coronary blood flow.   Please note: these test may take anywhere between 2-4 hours to complete  PLEASE REPORT TO [place] AT THE FIRST DESK WILL DIRECT YOU WHERE TO GO  Date of Procedure:_____________________________________  Arrival Time for Procedure:______________________________  Instructions regarding medication:   __x__:  Hold betablocker(s) night before procedure and morning of procedure  (metoprolol)  _______________________________________________________________  PLEASE NOTIFY THE OFFICE AT LEAST 24 HOURS IN ADVANCE IF YOU ARE UNABLE TO KEEP YOUR APPOINTMENT.  [phone] AND  PLEASE NOTIFY NUCLEAR MEDICINE AT [facility] AT LEAST 24 HOURS IN ADVANCE IF YOU ARE UNABLE TO KEEP YOUR APPOINTMENT. [phone]  How to prepare for your Myoview test:  1. Do not eat or drink after midnight 2. No caffeine for 24 hours prior to test 3. No smoking 24 hours prior to test. 4. Your medication may be taken with water.  If your doctor stopped a medication because of this test, do not take that medication. 5. Ladies, please do not wear dresses.  Skirts or pants are appropriate. Please wear a short sleeve shirt. 6. No perfume, cologne or lotion. 7. Wear comfortable walking shoes. No heels!   Follow-Up: At [facility], you and your health needs are our priority.  As part of our continuing mission to provide you with exceptional heart care, we have created designated Provider Care Teams.  These Care Teams include your primary Cardiologist (physician) and Advanced Practice Providers (APPs -  Physician Assistants and Nurse Practitioners) who all work together to provide you with the care you need, when you need it. You will need a follow up appointment in 1 months. You may see Ida Rogue, MD or one of the following Advanced Practice Providers on your designated Care Team:   Murray Hodgkins, NP Christell Faith, PA-C . Marrianne Mood, PA-C

## 2019-02-13 NOTE — Progress Notes (Signed)
Cardiology Office Note:    Date:  02/13/2019   ID:  Adam Juarez, DOB 04-29-1941, MRN [id number]  PCP:  Pleas Koch, NP  Cardiologist:  Ida Rogue, MD  Electrophysiologist:  None   Referring MD: Pleas Koch, NP   Chief Complaint  Patient presents with  . office visit    Episodes of chest "squeezing" and thought process changes x 2 days-squeezing has improving since taking diltiazem    History of Present Illness:    Adam Juarez is a 78 y.o. male with a hx of atrial fibrillation on Xarelto, who presents due to a 2-day history of chest tightness/squeezing.  He usually follows with Dr. Esmond Plants.  Two days ago, patient states he had chest tightness while he was sitting down associated with some shortness of breath.  This lasted a couple of seconds and then resolved.  Couple of minutes later symptoms recurred, about 8 times total on that day.  He denies palpitations.  He Cardizem 30 mg 3 times daily which she was told to take as needed.  He has been taking this 3 times standing with some improvement in his symptoms, and symptoms are less frequent.  He denies palpitations or any symptoms similar to his prior atrial fibrillation.  He denies having a heart attack.  His last echocardiogram about 2 years ago showed normal ejection fraction.    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  Concern for SVT Started taking old  diltiazem from 2 years ago Would like to change his medications to suppress the rhythm  Otherwise reports he has been in good health Tolerating anticoagulation CHADS VASC 3  Previous total cholesterol 140 up to 160  Other past medical history reviewed event monitor end of September for 30 days showing no significant atrial fibrillation.   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 last in November 2016.  He was admitted to the hospital for atrial fibrillation 08/08/2013, notes indicating he converted to normal sinus rhythm through his hospital course. He was not discharged on medications as he declined beta blockers  echocardiogram August was 7 2013  showing normal LV function, mild LVH, a stock dysfunction, aortic valve sclerosis, normal right heart pressures  Holter monitor report August 2013 detailing 12,000 PVCs, 11% of total beat count. 55 supraventricular ectopies, no mention of the longest run  Past Medical History:  Diagnosis Date  . Arthritis    fingers  . 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 Chicken pox   . Dysrhythmia 09/2013   Hx. a-fib x 1 episode patient states he "auto corrected" seen at [facility]  . GERD (gastroesophageal reflux disease)   . Headache    poor posture - none recently  . PSVT (paroxysmal supraventricular tachycardia) ([place]) 2009   Controlled with "breathing process"  .  Renal stone 9/08, 6/15  . Wears dentures    full upper    Past Surgical History:  Procedure Laterality Date  . CARDIAC CATHETERIZATION  [address] [place]  . CATARACT EXTRACTION W/PHACO Left 07/27/2015   Procedure: CATARACT EXTRACTION PHACO AND INTRAOCULAR LENS PLACEMENT (IOC);  Surgeon: Leandrew Koyanagi, MD;  Location: [facility];  Service: Ophthalmology;   Laterality: Left;  TORIC  . CATARACT EXTRACTION W/PHACO Right 08/24/2015   Procedure: CATARACT EXTRACTION PHACO AND INTRAOCULAR LENS PLACEMENT (IOC);  Surgeon: Leandrew Koyanagi, MD;  Location: [facility];  Service: Ophthalmology;  Laterality: Right;  TORIC  . HEMORROIDECTOMY  2010   banding  . KIDNEY STONE SURGERY  9/08, 6/15   lithotripsy  . PROSTATE BIOPSY  2007  . SKIN CANCER EXCISION    . TOENAIL TRIMMING  11/2017   Dr. Elvina Mattes  . VASECTOMY      Current Medications: Current Meds  Medication Sig  . atorvastatin (LIPITOR) 20 MG tablet Take 1 tablet (20 mg total) by mouth daily. For cholesterol.  . diltiazem (CARDIZEM) 30 MG tablet Take 1 tablet (30 mg total) by mouth 3 (three) times daily as needed (As needed for tachycardia).  . Magnesium 500 MG CAPS Take 500 mg by mouth daily.  . metoprolol succinate (TOPROL XL) 25 MG 24 hr tablet Take 1 tablet (25 mg total) by mouth 2 (two) times a day.  . Misc Natural Products (GLUCOSAMINE CHONDROITIN ADV PO) Take 1 tablet by mouth daily.  . Multiple Vitamins-Minerals (PRESERVISION AREDS 2 PO) Take by mouth 2 (two) times daily.  . Nutritional Supplements (DHEA PO) Take by mouth daily.   Marland Kitchen omeprazole (PRILOSEC) 20 MG capsule TAKE 1 CAPSULE DAILY  . Polyethyl Glycol-Propyl Glycol (SYSTANE OP) Apply 1 drop to eye 3 (three) times daily.  . Probiotic Product (PROBIOTIC DAILY PO) Take by mouth.  . rivaroxaban (XARELTO) 20 MG TABS tablet TAKE 1 TABLET DAILY WITH SUPPER     Allergies:   Other   Social History   Socioeconomic History  . Marital status: Married    Spouse name: Not on file  . Number of children: Not on file  . Years of education: Not on file  . Highest education level: Not on file  Occupational History  . Not on file  Social Needs  . Financial resource strain: Not on file  . Food insecurity    Worry: Not on file    Inability: Not on file  . Transportation needs    Medical: Not on file    Non-medical: Not on file   Tobacco Use  . Smoking status: Former Smoker    Packs/day: 0.50    Years: 38.00    Pack years: 19.00    Types: Cigarettes    Quit date: 07/13/1997    Years since quitting: 21.6  . Smokeless tobacco: Never Used  Substance and Sexual Activity  . Alcohol use: No  . Drug use: No  . Sexual activity: Not on file  Lifestyle  . Physical activity    Days per week: Not on file    Minutes per session: Not on file  . Stress: Not on file  Relationships  . Social Herbalist on phone: Not on file    Gets together: Not on file    Attends religious service: Not on file    Active member of club or organization: Not on file    Attends meetings of clubs or organizations: Not on  file    Relationship status: Not on file  Other Topics Concern  . Not on file  Social History Narrative  . Not on file     Family History: The patient's family history includes AAA (abdominal aortic aneurysm) in his father; Arthritis in his mother; Parkinson's disease in his mother.  ROS:   Please see the history of present illness.     All other systems reviewed and are negative.  EKGs/Labs/Other Studies Reviewed:    The following studies were reviewed today:   EKG:  EKG is  ordered today.  The ekg ordered today demonstrates normal sinus rhythm, normal ECG.  Recent Labs: 03/13/2018: TSH 2.15 02/06/2019: ALT 17; BUN 21; Creat 0.85; Hemoglobin 15.2; Platelets 151; Potassium 4.1; Sodium 142  Recent Lipid Panel    Component Value Date/Time   CHOL 123 01/30/2019 0734   TRIG 293.0 (H) 01/30/2019 0734   HDL 31.50 (L) 01/30/2019 0734   CHOLHDL 4 01/30/2019 0734   VLDL 58.6 (H) 01/30/2019 0734   LDLDIRECT 42.0 01/30/2019 0734    Physical Exam:    VS:  BP 110/70 (BP Location: Left Arm, Patient Position: Sitting, Cuff Size: Normal)   Pulse 71   Ht 5\' 10"  (1.778 m)   Wt 212 lb 8 oz (96.4 kg)   SpO2 95%   BMI 30.49 kg/m     Wt Readings from Last 3 Encounters:  02/13/19 212 lb 8 oz (96.4 kg)   02/06/19 212 lb 12 oz (96.5 kg)  12/01/18 205 lb 4 oz (93.1 kg)     GEN:  Well nourished, well developed in no acute distress HEENT: Normal NECK: No JVD; No carotid bruits LYMPHATICS: No lymphadenopathy CARDIAC: RRR, no murmurs, rubs, gallops RESPIRATORY:  Clear to auscultation without rales, wheezing or rhonchi  ABDOMEN: Soft, non-tender, non-distended MUSCULOSKELETAL:  No edema; No deformity  SKIN: Warm and dry NEUROLOGIC:  Alert and oriented x 3 PSYCHIATRIC:  Normal affect   ASSESSMENT:   Patient's symptoms appear atypical.  His risk factors include hypertriglyceridemia, former smoker, PAD and his age. this puts him at a low to intermediate risk for CAD and will does benefit from a stress test..  His last echo showed normal EF with no wall motion abnormalities.  1. Chest pain of uncertain etiology   2. Shortness of breath    PLAN:    Chest pain -We will get Lexiscan myocardial perfusion imaging. -Follow-up with Dr. Rockey Situ and after imaging.  Paroxysmal atrial fibrillation (HCC) -  Currently in sinus rhythm. Continue Toprol and Cardizem as currently prescribed. Continue anticoagulation     Medication Adjustments/Labs and Tests Ordered: Current medicines are reviewed at length with the patient today.  Concerns regarding medicines are outlined above.  Orders Placed This Encounter  Procedures  . NM Myocar Multi W/Spect W/Wall Motion / EF  . EKG 12-Lead   No orders of the defined types were placed in this encounter.   Patient Instructions  Medication Instructions:  Your physician recommends that you continue on your current medications as directed. Please refer to the Current Medication list given to you today.  If you need a refill on your cardiac medications before your next appointment, please call your pharmacy.   Lab work: None ordered  If you have labs (blood work) drawn today and your tests are completely normal, you will receive your results only by: Marland Kitchen  MyChart Message (if you have MyChart) OR . A paper copy in the mail If you have any lab test  that is abnormal or we need to change your treatment, we will call you to review the results.  Testing/Procedures: 1- [place]  Your caregiver has ordered a Stress Test with nuclear imaging. The purpose of this test is to evaluate the blood supply to your heart muscle. This procedure is referred to as a "Non-Invasive Stress Test." This is because other than having an IV started in your vein, nothing is inserted or "invades" your body. Cardiac stress tests are done to find areas of poor blood flow to the heart by determining the extent of coronary artery disease (CAD). Some patients exercise on a treadmill, which naturally increases the blood flow to your heart, while others who are  unable to walk on a treadmill due to physical limitations have a pharmacologic/chemical stress agent called Lexiscan . This medicine will mimic walking on a treadmill by temporarily increasing your coronary blood flow.   Please note: these test may take anywhere between 2-4 hours to complete  PLEASE REPORT TO [facility] AT THE FIRST DESK WILL DIRECT YOU WHERE TO GO  Date of Procedure:_____________________________________  Arrival Time for Procedure:______________________________  Instructions regarding medication:   __x__:  Hold betablocker(s) night before procedure and morning of procedure (metoprolol)  _______________________________________________________________  PLEASE NOTIFY THE OFFICE AT LEAST 24 HOURS IN ADVANCE IF YOU ARE UNABLE TO KEEP YOUR APPOINTMENT.  [phone] AND  PLEASE NOTIFY NUCLEAR MEDICINE AT [facility] AT LEAST 24 HOURS IN ADVANCE IF YOU ARE UNABLE TO KEEP YOUR APPOINTMENT. [phone]  How to prepare for your Myoview test:  1. Do not eat or drink after midnight 2. No caffeine for 24 hours prior to test 3. No smoking 24 hours prior to test. 4. Your medication  may be taken with water.  If your doctor stopped a medication because of this test, do not take that medication. 5. Ladies, please do not wear dresses.  Skirts or pants are appropriate. Please wear a short sleeve shirt. 6. No perfume, cologne or lotion. 7. Wear comfortable walking shoes. No heels!   Follow-Up: At [facility], you and your health needs are our priority.  As part of our continuing mission to provide you with exceptional heart care, we have created designated Provider Care Teams.  These Care Teams include your primary Cardiologist (physician) and Advanced Practice Providers (APPs -  Physician Assistants and Nurse Practitioners) who all work together to provide you with the care you need, when you need it. You will need a follow up appointment in 1 months. You may see Ida Rogue, MD or one of the following Advanced Practice Providers on your designated Care Team:   Murray Hodgkins, NP Christell Faith, PA-C . Marrianne Mood, PA-C     Signed, Kate Sable, MD  02/13/2019 11:32 AM    [place]

## 2019-02-24 DIAGNOSIS — M6281 Muscle weakness (generalized): Secondary | ICD-10-CM | POA: Diagnosis not present

## 2019-02-24 DIAGNOSIS — M545 Low back pain: Secondary | ICD-10-CM | POA: Diagnosis not present

## 2019-02-24 DIAGNOSIS — M546 Pain in thoracic spine: Secondary | ICD-10-CM | POA: Diagnosis not present

## 2019-02-27 DIAGNOSIS — M545 Low back pain: Secondary | ICD-10-CM | POA: Diagnosis not present

## 2019-02-27 DIAGNOSIS — M6281 Muscle weakness (generalized): Secondary | ICD-10-CM | POA: Diagnosis not present

## 2019-02-27 DIAGNOSIS — M546 Pain in thoracic spine: Secondary | ICD-10-CM | POA: Diagnosis not present

## 2019-03-02 ENCOUNTER — Other Ambulatory Visit: Payer: Self-pay

## 2019-03-02 ENCOUNTER — Ambulatory Visit
Admission: RE | Admit: 2019-03-02 | Discharge: 2019-03-02 | Disposition: A | Discharge status: Home / Self Care | Payer: Medicare Other | Source: Ambulatory Visit | Attending: Cardiology | Admitting: Cardiology

## 2019-03-02 DIAGNOSIS — R0602 Shortness of breath: Secondary | ICD-10-CM | POA: Insufficient documentation

## 2019-03-02 LAB — NM MYOCAR MULTI W/SPECT W/WALL MOTION / EF
Estimated workload: 1 METS
Exercise duration (min): 0 min
Exercise duration (sec): 0 s
LV dias vol: 88 mL (ref 62–150)
LV sys vol: 25 mL
MPHR: 143 {beats}/min
Peak HR: 76 {beats}/min
Percent HR: 53 %
Rest HR: 62 {beats}/min
SDS: 2
SRS: 2
SSS: 3
TID: 1

## 2019-03-02 MED ORDER — TECHNETIUM TC 99M TETROFOSMIN IV KIT
10.0000 | PACK | Freq: Once | INTRAVENOUS | Status: AC | PRN
  Administered 2019-03-02: 11.16 via INTRAVENOUS

## 2019-03-02 MED ORDER — REGADENOSON 0.4 MG/5ML IV SOLN
0.4000 mg | Freq: Once | INTRAVENOUS | Status: AC
  Administered 2019-03-02: 0.4 mg via INTRAVENOUS

## 2019-03-02 MED ORDER — TECHNETIUM TC 99M TETROFOSMIN IV KIT
30.7600 | PACK | Freq: Once | INTRAVENOUS | Status: AC | PRN
  Administered 2019-03-02: 30.76 via INTRAVENOUS

## 2019-03-03 DIAGNOSIS — M6281 Muscle weakness (generalized): Secondary | ICD-10-CM | POA: Diagnosis not present

## 2019-03-03 DIAGNOSIS — M545 Low back pain: Secondary | ICD-10-CM | POA: Diagnosis not present

## 2019-03-03 DIAGNOSIS — M546 Pain in thoracic spine: Secondary | ICD-10-CM | POA: Diagnosis not present

## 2019-03-09 DIAGNOSIS — M545 Low back pain: Secondary | ICD-10-CM | POA: Diagnosis not present

## 2019-03-09 DIAGNOSIS — M6281 Muscle weakness (generalized): Secondary | ICD-10-CM | POA: Diagnosis not present

## 2019-03-09 DIAGNOSIS — M546 Pain in thoracic spine: Secondary | ICD-10-CM | POA: Diagnosis not present

## 2019-03-11 ENCOUNTER — Telehealth: Payer: Self-pay | Admitting: Cardiovascular Disease

## 2019-03-11 NOTE — Telephone Encounter (Signed)
Virtual Visit Pre-Appointment Phone Call  "(Name), I am calling you today to discuss your upcoming appointment. We are currently trying to limit exposure to the virus that causes COVID-19 by seeing patients at home rather than in the office."  1. "What is the BEST phone number to call the day of the visit?" - include this in appointment notes  2. Do you have or have access to (through a family member/friend) a smartphone with video capability that we can use for your visit?" a. If yes - list this number in appt notes as cell (if different from BEST phone #) and list the appointment type as a VIDEO visit in appointment notes b. If no - list the appointment type as a PHONE visit in appointment notes  3. Confirm consent - "In the setting of the current Covid19 crisis, you are scheduled for a (phone or video) visit with your provider on (date) at (time).  Just as we do with many in-office visits, in order for you to participate in this visit, we must obtain consent.  If you'd like, I can send this to your mychart (if signed up) or email for you to review.  Otherwise, I can obtain your verbal consent now.  All virtual visits are billed to your insurance company just like a normal visit would be.  By agreeing to a virtual visit, we'd like you to understand that the technology does not allow for your provider to perform an examination, and thus may limit your provider's ability to fully assess your condition. If your provider identifies any concerns that need to be evaluated in person, we will make arrangements to do so.  Finally, though the technology is pretty good, we cannot assure that it will always work on either your or our end, and in the setting of a video visit, we may have to convert it to a phone-only visit.  In either situation, we cannot ensure that we have a secure connection.  Are you willing to proceed?" STAFF: Did the patient verbally acknowledge consent to telehealth visit? Document  YES/NO here: YES  4. Advise patient to be prepared - "Two hours prior to your appointment, go ahead and check your blood pressure, pulse, oxygen saturation, and your weight (if you have the equipment to check those) and write them all down. When your visit starts, your provider will ask you for this information. If you have an Apple Watch or Kardia device, please plan to have heart rate information ready on the day of your appointment. Please have a pen and paper handy nearby the day of the visit as well."  5. Give patient instructions for MyChart download to smartphone OR Doximity/Doxy.me as below if video visit (depending on what platform provider is using)  6. Inform patient they will receive a phone call 15 minutes prior to their appointment time (may be from unknown caller ID) so they should be prepared to answer    TELEPHONE CALL NOTE  Adam Juarez has been deemed a candidate for a follow-up tele-health visit to limit community exposure during the Covid-19 pandemic. I spoke with the patient via phone to ensure availability of phone/video source, confirm preferred email & phone number, and discuss instructions and expectations.  I reminded Adam Juarez to be prepared with any vital sign and/or heart rhythm information that could potentially be obtained via home monitoring, at the time of his visit. I reminded Adam Juarez to expect a phone call prior to his visit.  Adam Juarez 03/11/2019 11:47 AM   INSTRUCTIONS FOR DOWNLOADING THE MYCHART APP TO SMARTPHONE  - The patient must first make sure to have activated MyChart and know their login information - If Apple, go to [employer] and type in MyChart in the search bar and download the app. If Android, ask patient to go to [employer] and type in [place] in the search bar and download the app. The app is free but as with any other app downloads, their phone may require them to verify saved payment information or Apple/Android  password.  - The patient will need to then log into the app with their MyChart username and password, and select [facility] as their healthcare provider to link the account. When it is time for your visit, go to the MyChart app, find appointments, and click Begin Video Visit. Be sure to Select Allow for your device to access the Microphone and Camera for your visit. You will then be connected, and your provider will be with you shortly.  **If they have any issues connecting, or need assistance please contact MyChart service desk [phone])**  **If using a computer, in order to ensure the best quality for their visit they will need to use either of the following Internet Browsers: [employer], or Google Chrome**  IF USING DOXIMITY or DOXY.ME - The patient will receive a link just prior to their visit by text.     FULL LENGTH CONSENT FOR TELE-HEALTH VISIT   I hereby voluntarily request, consent and authorize [facility] and its employed or contracted physicians, physician assistants, nurse practitioners or other licensed health care professionals (the Practitioner), to provide me with telemedicine health care services (the Services") as deemed necessary by the treating Practitioner. I acknowledge and consent to receive the Services by the Practitioner via telemedicine. I understand that the telemedicine visit will involve communicating with the Practitioner through live audiovisual communication technology and the disclosure of certain medical information by electronic transmission. I acknowledge that I have been given the opportunity to request an in-person assessment or other available alternative prior to the telemedicine visit and am voluntarily participating in the telemedicine visit.  I understand that I have the right to withhold or withdraw my consent to the use of telemedicine in the course of my care at any time, without affecting my right to future care or treatment,  and that the Practitioner or I may terminate the telemedicine visit at any time. I understand that I have the right to inspect all information obtained and/or recorded in the course of the telemedicine visit and may receive copies of available information for a reasonable fee.  I understand that some of the potential risks of receiving the Services via telemedicine include:   Delay or interruption in medical evaluation due to technological equipment failure or disruption;  Information transmitted may not be sufficient (e.g. poor resolution of images) to allow for appropriate medical decision making by the Practitioner; and/or   In rare instances, security protocols could fail, causing a breach of personal health information.  Furthermore, I acknowledge that it is my responsibility to provide information about my medical history, conditions and care that is complete and accurate to the best of my ability. I acknowledge that Practitioner's advice, recommendations, and/or decision may be based on factors not within their control, such as incomplete or inaccurate data provided by me or distortions of diagnostic images or specimens that may result from electronic transmissions. I understand that the practice  of medicine is not an Chief Strategy Officer and that Practitioner makes no warranties or guarantees regarding treatment outcomes. I acknowledge that I will receive a copy of this consent concurrently upon execution via email to the email address I last provided but may also request a printed copy by calling the office of [facility].    I understand that my insurance will be billed for this visit.   I have read or had this consent read to me.  I understand the contents of this consent, which adequately explains the benefits and risks of the Services being provided via telemedicine.   I have been provided ample opportunity to ask questions regarding this consent and the Services and have had my questions  answered to my satisfaction.  I give my informed consent for the services to be provided through the use of telemedicine in my medical care  By participating in this telemedicine visit I agree to the above.

## 2019-03-12 ENCOUNTER — Emergency Department: Payer: Medicare Other

## 2019-03-12 ENCOUNTER — Encounter: Payer: Self-pay | Admitting: Emergency Medicine

## 2019-03-12 ENCOUNTER — Emergency Department
Admission: EM | Admit: 2019-03-12 | Discharge: 2019-03-12 | Disposition: A | Discharge status: Home / Self Care | Payer: Medicare Other | Attending: Emergency Medicine | Admitting: Emergency Medicine

## 2019-03-12 ENCOUNTER — Other Ambulatory Visit: Payer: Self-pay

## 2019-03-12 DIAGNOSIS — I714 Abdominal aortic aneurysm, without rupture, unspecified: Secondary | ICD-10-CM

## 2019-03-12 DIAGNOSIS — M545 Low back pain, unspecified: Secondary | ICD-10-CM

## 2019-03-12 LAB — COMPREHENSIVE METABOLIC PANEL
ALT: 18 U/L (ref 0–44)
AST: 28 U/L (ref 15–41)
Albumin: 4.5 g/dL (ref 3.5–5.0)
Alkaline Phosphatase: 61 U/L (ref 38–126)
Anion gap: 13 (ref 5–15)
BUN: 21 mg/dL (ref 8–23)
CO2: 23 mmol/L (ref 22–32)
Calcium: 9.5 mg/dL (ref 8.9–10.3)
Chloride: 103 mmol/L (ref 98–111)
Creatinine, Ser: 0.9 mg/dL (ref 0.61–1.24)
GFR calc Af Amer: >60 mL/min (ref >60)
GFR calc non Af Amer: >60 mL/min (ref >60)
Glucose, Bld: 118 mg/dL — ABNORMAL HIGH (ref 70–99)
Potassium: 4.2 mmol/L (ref 3.5–5.1)
Sodium: 139 mmol/L (ref 135–145)
Total Bilirubin: 1.2 mg/dL (ref 0.3–1.2)
Total Protein: 7.7 g/dL (ref 6.5–8.1)

## 2019-03-12 LAB — URINALYSIS, COMPLETE (UACMP) WITH MICROSCOPIC
Bacteria, UA: NONE SEEN
Bilirubin Urine: NEGATIVE
Glucose, UA: NEGATIVE mg/dL
Hgb urine dipstick: NEGATIVE
Ketones, ur: NEGATIVE mg/dL
Leukocytes,Ua: NEGATIVE
Nitrite: NEGATIVE
Protein, ur: NEGATIVE mg/dL
Specific Gravity, Urine: 1.042 — ABNORMAL HIGH (ref 1.005–1.030)
pH: 7 (ref 5.0–8.0)

## 2019-03-12 LAB — CBC
HCT: 43.2 % (ref 39.0–52.0)
Hemoglobin: 14.8 g/dL (ref 13.0–17.0)
MCH: 32.8 pg (ref 26.0–34.0)
MCHC: 34.3 g/dL (ref 30.0–36.0)
MCV: 95.8 fL (ref 80.0–100.0)
Platelets: 144 10*3/uL — ABNORMAL LOW (ref 150–400)
RBC: 4.51 MIL/uL (ref 4.22–5.81)
RDW: 12.6 % (ref 11.5–15.5)
WBC: 4.9 10*3/uL (ref 4.0–10.5)
nRBC: 0 % (ref 0.0–0.2)

## 2019-03-12 MED ORDER — OXYCODONE-ACETAMINOPHEN 5-325 MG PO TABS
1.0000 | ORAL_TABLET | ORAL | Status: AC
  Administered 2019-03-12: 1 via ORAL
  Filled 2019-03-12: qty 1

## 2019-03-12 MED ORDER — OXYCODONE-ACETAMINOPHEN 5-325 MG PO TABS: 1.0000 | ORAL_TABLET | Freq: Four times a day (QID) | ORAL | 0 refills | Status: DC | PRN

## 2019-03-12 MED ORDER — MORPHINE SULFATE (PF) 4 MG/ML IV SOLN
4.0000 mg | Freq: Once | INTRAVENOUS | Status: AC
  Administered 2019-03-12: 4 mg via INTRAVENOUS
  Filled 2019-03-12: qty 1

## 2019-03-12 MED ORDER — DEXAMETHASONE 4 MG PO TABS
8.0000 mg | ORAL_TABLET | Freq: Once | ORAL | Status: AC
  Administered 2019-03-12: 8 mg via ORAL
  Filled 2019-03-12: qty 2

## 2019-03-12 MED ORDER — ONDANSETRON HCL 4 MG/2ML IJ SOLN
4.0000 mg | Freq: Once | INTRAMUSCULAR | Status: AC
  Administered 2019-03-12: 4 mg via INTRAVENOUS
  Filled 2019-03-12: qty 2

## 2019-03-12 MED ORDER — IOHEXOL 350 MG/ML SOLN
75.0000 mL | Freq: Once | INTRAVENOUS | Status: AC | PRN
  Administered 2019-03-12: 75 mL via INTRAVENOUS

## 2019-03-12 NOTE — Discharge Instructions (Signed)
No driving today or while taking oxycodone.

## 2019-03-12 NOTE — ED Notes (Signed)
Pt requesting to speak with MD again at this time about treatment plan. MD Quale made aware

## 2019-03-12 NOTE — ED Notes (Signed)
ED Provider at bedside. 

## 2019-03-12 NOTE — ED Provider Notes (Addendum)
[facility] Emergency Department Provider Note   ____________________________________________   First MD Initiated Contact with Patient 03/12/19 [phone]     (approximate)  I have reviewed the triage vital signs and the nursing notes.   HISTORY  Chief Complaint Back Pain    HPI Patient having significant severe primarily left lower back pain.  4 days of pain in the left lower back, suddenly worsened about 4 AM.  Reports this is a severe pain located solely in the left lower back except his shoes to slightly to the right.  No associated neurologic symptoms.  No muscle weakness.  No cold or blue feet.  Reports that he has a long history of back pain, but also has a history of aortic aneurysm followed by cardiology  Denies chest pain or trouble breathing.   Past Medical History:  Diagnosis Date  . Arthritis    fingers  . 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 Chicken pox   . Dysrhythmia 09/2013   Hx. a-fib x 1 episode patient states he "auto corrected" seen at [facility]  . GERD (gastroesophageal reflux disease)   . Headache    poor posture - none recently  . PSVT (paroxysmal supraventricular tachycardia) ([place]) 2009   Controlled with "breathing process"  . Renal stone 9/08, 6/15  . Wears dentures    full upper    Patient Active Problem List   Diagnosis Date Noted  . Medicare annual wellness visit, subsequent 02/06/2019  . Memory changes 11/21/2018  . Aortic atherosclerosis ([place]) 03/27/2018  . PAD (peripheral artery disease) ([place]) 03/27/2018  . Smoking history 03/27/2018  . Chronic left shoulder pain 03/13/2018  . History of systemic reaction to bee sting 01/28/2018  . Prediabetes 01/28/2018  . History of elevated PSA 01/28/2018  . Fall with injury 05/22/2017  . AAA (abdominal aortic aneurysm) ([place]) 05/22/2017  . Frequent headaches 01/15/2017  . Osteoarthritis 07/13/2016  . Chronic foot pain 07/13/2016  . Encounter for  abdominal aortic aneurysm (AAA) screening 02/17/2016  . Mixed hyperlipidemia 11/27/2015  . Chronic back pain 11/27/2015  . Chronic fatigue 11/27/2015  . SVT (supraventricular tachycardia) ([place]) 09/16/2015  . Paroxysmal atrial fibrillation ([place]) 09/16/2015  . 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 Esophageal reflux 09/16/2015  . Preventative health care 09/16/2015    Past Surgical History:  Procedure Laterality Date  . CARDIAC CATHETERIZATION  [address] [place]  . CATARACT EXTRACTION W/PHACO Left 07/27/2015   Procedure: CATARACT EXTRACTION PHACO AND INTRAOCULAR LENS PLACEMENT (IOC);  Surgeon: Leandrew Koyanagi, MD;  Location: [place];  Service: Ophthalmology;  Laterality: Left;  TORIC  . CATARACT EXTRACTION W/PHACO Right 08/24/2015   Procedure: CATARACT EXTRACTION PHACO AND INTRAOCULAR LENS PLACEMENT (IOC);  Surgeon: Leandrew Koyanagi, MD;  Location: [place];  Service: Ophthalmology;  Laterality: Right;  TORIC  . HEMORROIDECTOMY  2010   banding  . KIDNEY STONE SURGERY  9/08, 6/15   lithotripsy  . PROSTATE BIOPSY  2007  . SKIN CANCER EXCISION    . TOENAIL TRIMMING  11/2017   Dr. Elvina Mattes  . VASECTOMY      Prior to Admission medications   Medication Sig Start Date End Date Taking? Authorizing Provider  atorvastatin (LIPITOR) 20 MG tablet Take 1 tablet (20 mg total) by mouth daily. For cholesterol. 11/21/18   Pleas Koch, NP  Coenzyme Q10 (COQ10) 200 MG CAPS Take 200 mg by mouth daily.    [provider]  diltiazem (  CARDIZEM) 30 MG tablet Take 1 tablet (30 mg total) by mouth 3 (three) times daily as needed (As needed for tachycardia). 02/12/19   Minna Merritts, MD  Magnesium 500 MG CAPS Take 500 mg by mouth daily.    [provider]  Magnesium Cl-Calcium Carbonate (SLOW-MAG PO) Take by mouth daily.    [provider]  metoprolol succinate (TOPROL XL) 25 MG 24 hr tablet Take 1 tablet (25 mg total) by mouth 2 (two) times a day.  10/29/18   Minna Merritts, MD  Multiple Vitamins-Minerals (PRESERVISION AREDS 2 PO) Take by mouth 2 (two) times daily.    [provider]  omeprazole (PRILOSEC) 20 MG capsule TAKE 1 CAPSULE DAILY 12/16/18   Pleas Koch, NP  oxyCODONE-acetaminophen (PERCOCET/ROXICET) 5-325 MG tablet Take 1 tablet by mouth every 6 (six) hours as needed for severe pain. 03/12/19   Delman Kitten, MD  Polyethyl Glycol-Propyl Glycol (SYSTANE OP) Apply 1 drop to eye 3 (three) times daily.    [provider]  Probiotic Product (PROBIOTIC DAILY PO) Take by mouth daily.     [provider]  rivaroxaban (XARELTO) 20 MG TABS tablet TAKE 1 TABLET DAILY WITH SUPPER 04/02/18   Minna Merritts, MD    Allergies Other  Family History  Problem Relation Age of Onset  . AAA (abdominal aortic aneurysm) Father   . Parkinson's disease Mother   . Arthritis Mother     Social History Social History   Tobacco Use  . Smoking status: Former Smoker    Packs/day: 0.50    Years: 38.00    Pack years: 19.00    Types: Cigarettes    Quit date: 07/13/1997    Years since quitting: 21.7  . Smokeless tobacco: Never Used  Substance Use Topics  . Alcohol use: No  . Drug use: No    Review of Systems Constitutional: No fever/chills Cardiovascular: Denies chest pain. Respiratory: Denies shortness of breath. Gastrointestinal: No abdominal pain.   Genitourinary: Negative for dysuria. Musculoskeletal: See HPI Skin: Negative for rash. Neurological: Negative for headaches, areas of focal weakness or numbness.    ____________________________________________   PHYSICAL EXAM:  VITAL SIGNS: ED Triage Vitals  Enc Vitals Group     BP 03/12/19 0626 140/79     Pulse Rate 03/12/19 0626 62     Resp 03/12/19 1015 17     Temp 03/12/19 0626 98.1 F (36.7 C)     Temp Source 03/12/19 0626 Oral     SpO2 03/12/19 0626 95 %     Weight 03/12/19 0626 205 lb (93 kg)     Height 03/12/19 0626 5\' 10"  (1.778 m)      Head Circumference --      Peak Flow --      Pain Score 03/12/19 0626 10     Pain Loc --      Pain Edu? --      Excl. in [place]? --     Constitutional: Alert and oriented.  Appears in pain, denying significant pain in his left lower back to mid back and flank region.  Eyes: Conjunctivae are normal. Head: Atraumatic. Nose: No congestion/rhinnorhea. Mouth/Throat: Mucous membranes are moist. Neck: No stridor.  Cardiovascular: Normal rate, regular rhythm. Grossly normal heart sounds.  Good peripheral circulation. Respiratory: Normal respiratory effort.  No retractions. Lungs CTAB. Gastrointestinal: Soft and nontender. No distention.  Clinical exam demonstrates abdomen soft nontender nondistended.  There is moderate reproducible tenderness to palpation in the left lower  lumbar paraspinous region.  There is no saddle anesthesia.  Lower Extremities  No edema. Normal DP/PT pulses bilateral with good cap refill.  Normal neuro-motor function lower extremities bilateral.  RIGHT Right lower extremity demonstrates normal strength, good use of all muscles. No edema bruising or contusions of the right hip, right knee, right ankle. Full range of motion of the right lower extremity without pain. No pain on axial loading. No evidence of trauma.  LEFT Left lower extremity demonstrates normal strength, good use of all muscles. No edema bruising or contusions of the hip,  knee, ankle. Full range of motion of the left lower extremity without pain. No pain on axial loading. No evidence of trauma.  Musculoskeletal: No lower extremity tenderness nor edema.  Reproducible tenderness on left lower paraspinous region around the lumbosacral region. Neurologic:  Normal speech and language. No gross focal neurologic deficits are appreciated.  Skin:  Skin is warm, dry and intact. No rash noted. Psychiatric: Mood and affect are normal. Speech and behavior are  normal.    ____________________________________________   LABS (all labs ordered are listed, but only abnormal results are displayed)  Labs Reviewed  CBC - Abnormal; Notable for the following components:      Result Value   Platelets 144 (*)    All other components within normal limits  COMPREHENSIVE METABOLIC PANEL - Abnormal; Notable for the following components:   Glucose, Bld 118 (*)    All other components within normal limits  URINALYSIS, COMPLETE (UACMP) WITH MICROSCOPIC - Abnormal; Notable for the following components:   Color, Urine YELLOW (*)    APPearance CLEAR (*)    Specific Gravity, Urine 1.042 (*)    All other components within normal limits   ____________________________________________  EKG  Reviewed interpreted at 735 Heart rate 65 QRS 100 QTc 420 Normal sinus rhythm, very minimal nonspecific T wave abnormality noted.  No ST elevation ____________________________________________  RADIOLOGY  IMPRESSION: 1. 4.1 cm infrarenal abdominal aortic aneurysm without complicating features. Recommend followup by ultrasound in 1 year. This recommendation follows ACR consensus guidelines: White Paper of the ACR Incidental Findings Committee II on Vascular Findings. J Am Coll Radiol 2013; 10:789-794. 2. 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 3. Descending and proximal sigmoid diverticulosis. 4. Small hiatal hernia. ____________________________________________   PROCEDURES  Procedure(s) performed: None  Procedures  Critical Care performed: No  ____________________________________________   INITIAL IMPRESSION / ASSESSMENT AND PLAN / ED COURSE  Clinical Course as of Mar 26 839  Thu Mar 12, 2019  0930 Patient has no saddle anesthesia.  5-5 strength in all lower extremities.  No loss of sensation.  No trouble with bowels or bladder   [MQ]  1149 Dr. Derenda Mis, rads, discussed case. No acute AAA rupture or noted acute abd CT findings. Final read  pending   [MQ]    Clinical Course User Index [MQ] Delman Kitten, MD     ____________________________________________   FINAL CLINICAL IMPRESSION(S) / ED DIAGNOSES  Final diagnoses:  Acute left-sided low back pain without sciatica  Abdominal aortic aneurysm (AAA) without rupture (HCC)    ----------------------------------------- 8:03 AM on 03/12/2019 -----------------------------------------      Ct Angio Abd/pel W And/or Wo Contrast  Result Date: 03/12/2019 CLINICAL DATA:  Chest and back pain, left lower back pain x4 days with questionable lower extremity radiation, aortic dissection suspected, evaluate for AAA versus lumbar or MSK etiology EXAM: CTA ABDOMEN AND PELVIS WITH CONTRAST TECHNIQUE: Multidetector CT imaging of the abdomen and pelvis  was performed using the standard protocol during bolus administration of intravenous contrast. Multiplanar reconstructed images and MIPs were obtained and reviewed to evaluate the vascular anatomy. CONTRAST:  56mL OMNIPAQUE IOHEXOL 350 MG/ML SOLN COMPARISON:  Lumbar MR 06/07/2018, ultrasound 02/22/2016, CT 10/14/2013 FINDINGS: VASCULAR Aorta: Mildly tortuous visualized distal descending thoracic segment. Fusiform infrarenal aneurysm 4.1 x 3.7 cm maximum transverse dimensions, tapering to 2.4 cm above the bifurcation. Small amount of nonocclusive mural thrombus in the aneurysmal segment. No evidence of leak or impending rupture. No dissection or stenosis. Celiac: Patent without evidence of aneurysm, dissection, vasculitis or significant stenosis. SMA: Patent without evidence of aneurysm, dissection, vasculitis or significant stenosis. Renals: Single left, widely patent. Duplicated right, superior dominant, both patent. IMA: Patent, arising from aneurysmal segment of aorta. Inflow: Right common iliac artery dilated up to 2.1 cm diameter, left 1.8 cm. Scattered calcified plaque. No dissection or stenosis. Proximal Outflow: Bilateral common femoral and  visualized portions of the superficial and profunda femoral arteries are patent without evidence of aneurysm, dissection, vasculitis or significant stenosis. Veins: Patent portal vein, bilateral renal veins, IVC. No venous pathology evident. Review of the MIP images confirms the above findings. NON-VASCULAR Lower chest: Coarse linear scarring in the visualized lung bases, increased since prior CT. No pleural or pericardial effusion. Hepatobiliary: No focal liver abnormality is seen. No gallstones, gallbladder wall thickening, or biliary dilatation. Pancreas: Unremarkable. No pancreatic ductal dilatation or surrounding inflammatory changes. Spleen: Normal in size without focal abnormality. Adrenals/Urinary Tract: Normal adrenals. Focal parenchymal loss in the mid left kidney as before. No renal mass or hydronephrosis. Urinary bladder physiologically distended. Stomach/Bowel: Small hiatal hernia. Stomach decompressed. Small bowel nondilated. Normal appendix. Scattered descending and proximal sigmoid colon diverticula without significant adjacent inflammatory/edematous change or abscess. Lymphatic: No abdominal or pelvic adenopathy. Reproductive: Enlarged prostate with central coarse calcifications. 1.2 cm left extratesticular calcification in the scrotum. Other: Bilateral pelvic phleboliths. No ascites. No free air. Musculoskeletal: Mild spondylitic changes in the lower thoracic and lumbar spine. No fracture or worrisome bone lesion. IMPRESSION: 1. 4.1 cm infrarenal abdominal aortic aneurysm without complicating features. Recommend followup by ultrasound in 1 year. This recommendation follows ACR consensus guidelines: White Paper of the ACR Incidental Findings Committee II on Vascular Findings. J Am Coll Radiol 2013; 10:789-794. 2. 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 3. Descending and proximal sigmoid diverticulosis. 4. Small hiatal hernia. Electronically Signed   By: Lucrezia Europe M.D.   On: 03/12/2019  11:51    Clinical Course as of Mar 11 1241  Thu Mar 12, 2019  0930 Patient has no saddle anesthesia.  5-5 strength in all lower extremities.  No loss of sensation.  No trouble with bowels or bladder   [MQ]  1149 Dr. Derenda Mis, rads, discussed case. No acute AAA rupture or noted acute abd CT findings. Final read pending   [MQ]    Clinical Course User Index [MQ] Delman Kitten, MD    ----------------------------------------- 12:42 PM on 03/12/2019 -----------------------------------------  Case reviewed with Dr. Lucky Cowboy.  Advises close follow-up with his vascular surgery clinic.  Most likely suspect musculoskeletal cause without evidence to support concern for cauda equina.  Possibly radicular type pain, CT reassuring though some increase in the patient's aneurysm.  Will prescribe short course of prescription pain medication which was discussed with the patient. I will prescribe the patient a narcotic pain medicine due to their condition which I anticipate will cause at least moderate pain short term. I discussed with the patient safe  use of narcotic pain medicines, and that they are not to drive, work in dangerous areas, or ever take more than prescribed (no more than 1 pill every 6 hours). We discussed that this is the type of medication that can be  overdosed on and the risks of this type of medicine. Patient is very agreeable to only use as prescribed and to never use more than prescribed.  Prescriber database reviewed  Patient and wife comfortable with plan, will be following up closely with vascular surgery and also further follow-up advised with primary and/or Dr. Cari Caraway regarding his back.        Delman Kitten, MD 03/12/19 1243    Delman Kitten, MD 03/26/19 [phone]

## 2019-03-12 NOTE — ED Triage Notes (Signed)
Pt to triage via w/c with no distress noted, mask in place; pt reports lower back pain, nonradiating with no accomp symptoms; st chronic back pain and denies any new injuries

## 2019-03-12 NOTE — ED Notes (Signed)
Patient transported to CT 

## 2019-03-12 NOTE — ED Notes (Signed)
Family at bedside. 

## 2019-03-12 NOTE — ED Notes (Signed)
See triage note  Presents with lower back pain  States pain started about 4 days ago  Became worse this am  States he is unsure if pain radiates into left leg  Denies any injury

## 2019-03-16 ENCOUNTER — Ambulatory Visit: Payer: Medicare Other | Admitting: Cardiovascular Disease

## 2019-03-16 NOTE — Progress Notes (Signed)
Virtual Visit via Video Note   This visit type was conducted due to national recommendations for restrictions regarding the COVID-19 Pandemic (e.g. social distancing) in an effort to limit this patient's exposure and mitigate transmission in our community.  Due to his co-morbid illnesses, this patient is at least at moderate risk for complications without adequate follow up.  This format is felt to be most appropriate for this patient at this time.  All issues noted in this document were discussed and addressed.  A limited physical exam was performed with this format.  Please refer to the patient's chart for his consent to telehealth for [facility].   I connected with  Adam Juarez on 03/16/19 by a video enabled telemedicine application and verified that I am speaking with the correct person using two identifiers. I discussed the limitations of evaluation and management by telemedicine. The patient expressed understanding and agreed to proceed.   Evaluation Performed:  Follow-up visit  Date:  03/16/2019   ID:  Adam Juarez, DOB [birth year], MRN [id number]  Patient Location:  [place]  [address]   Provider location:   [facility], [place] office  PCP:  Pleas Koch, NP  Cardiologist:  Patsy Baltimore   Chief Complaint:  Tachycardia episodes   History of Present Illness:    Adam Juarez is a 78 y.o. male who presents via audio/video conferencing for a telehealth visit today.   The patient does not symptoms concerning for COVID-19 infection (fever, chills, cough, or new SHORTNESS OF BREATH).   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  Descending and proximal sigmoid diverticulosis. 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 Cari Caraway   Through the stress, no arrhythmia  Stress test:03/02/2019 reviewed with him  There was no ST segment deviation noted during stress.  No T wave inversion was noted during stress.  Defect 1: There is a medium defect of mild severity present in the basal inferior and mid inferior location. This is likely due to diaphragm attenuation.  The study is normal.  This is a low risk study.  The left ventricular ejection fraction is normal (55-65%).    Tolerating anticoagulation CHADS VASC 3  Other past medical history reviewed event monitor end of September for 30 days showing no significant atrial fibrillation.   CT scan abdomen June 2015   Moderate diffuse descending aorta disease extending into the iliac vessels     tachycardia in April 2017 Monitors heart rate on his watch Numerous episodes of tachycardia in March 2017. last in November 2016.  He was admitted to the hospital for atrial fibrillation 08/08/2013, notes indicating he converted to normal sinus rhythm through his hospital course. He was not discharged on medications as he declined beta blockers  echocardiogram August was 7 2013  showing normal LV function, mild LVH, a stock dysfunction, aortic valve sclerosis, normal right heart pressures  Holter monitor report August 2013 detailing 12,000 PVCs, 11%  of total beat count. 55 supraventricular ectopies, no mention of the longest run   Prior CV studies:   The following studies were reviewed today:    Past Medical History:  Diagnosis Date  . Arthritis    fingers  . 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 Chicken pox    . Dysrhythmia 09/2013   Hx. a-fib x 1 episode patient states he "auto corrected" seen at [facility]  . GERD (gastroesophageal reflux disease)   . Headache    poor posture - none recently  . PSVT (paroxysmal supraventricular tachycardia) ([place]) 2009   Controlled with "breathing process"  . Renal stone 9/08, 6/15  . Wears dentures    full upper   Past Surgical History:  Procedure Laterality Date  . CARDIAC CATHETERIZATION  [address], [place]  . CATARACT EXTRACTION W/PHACO Left 07/27/2015   Procedure: CATARACT EXTRACTION PHACO AND INTRAOCULAR LENS PLACEMENT (IOC);  Surgeon: Leandrew Koyanagi, MD;  Location: [facility];  Service: Ophthalmology;  Laterality: Left;  TORIC  . CATARACT EXTRACTION W/PHACO Right 08/24/2015   Procedure: CATARACT EXTRACTION PHACO AND INTRAOCULAR LENS PLACEMENT (IOC);  Surgeon: Leandrew Koyanagi, MD;  Location: [facility];  Service: Ophthalmology;  Laterality: Right;  TORIC  . HEMORROIDECTOMY  2010   banding  . KIDNEY STONE SURGERY  9/08, 6/15   lithotripsy  . PROSTATE BIOPSY  2007  . SKIN CANCER EXCISION    . TOENAIL TRIMMING  11/2017   Dr. Elvina Mattes  . VASECTOMY       No outpatient medications have been marked as taking for the 03/18/19 encounter (Appointment) with Minna Merritts, MD.     Allergies:   Other   Social History   Tobacco Use  . Smoking status: Former Smoker    Packs/day: 0.50    Years: 38.00    Pack years: 19.00    Types: Cigarettes    Quit date: 07/13/1997    Years since quitting: 21.6  . Smokeless tobacco: Never Used  Substance Use Topics  . Alcohol use: No  . Drug use: No     Current Outpatient Medications on File Prior to Visit  Medication Sig Dispense Refill  . atorvastatin (LIPITOR) 20 MG tablet Take 1 tablet (20 mg total) by mouth daily. For cholesterol. 90 tablet 3  . Coenzyme Q10 (COQ10) 200 MG CAPS Take 200 mg by mouth daily.    Marland Kitchen diltiazem (CARDIZEM) 30 MG tablet Take 1 tablet (30 mg  total) by mouth 3 (three) times daily as needed (As needed for tachycardia). 90 tablet 3  . Magnesium 500 MG CAPS Take 500 mg by mouth daily.    . metoprolol succinate (TOPROL XL) 25 MG 24 hr tablet Take 1 tablet (25 mg total) by mouth 2 (two) times a day. 180 tablet 3  . Misc Natural Products (GLUCOSAMINE CHONDROITIN ADV PO) Take 1 tablet by mouth daily.    . Multiple Vitamins-Minerals (PRESERVISION AREDS 2 PO) Take by mouth 2 (two) times daily.    . Nutritional Supplements (DHEA PO) Take by mouth daily.     Marland Kitchen omeprazole (PRILOSEC) 20 MG capsule TAKE 1 CAPSULE DAILY 90 capsule 1  . oxyCODONE-acetaminophen (PERCOCET/ROXICET) 5-325 MG tablet Take 1 tablet by mouth every 6 (six) hours as needed for severe pain. 16 tablet 0  . Polyethyl Glycol-Propyl Glycol (SYSTANE OP) Apply 1 drop to eye 3 (three) times daily.    . Probiotic Product (PROBIOTIC DAILY PO) Take by mouth.    Marland Kitchen  rivaroxaban (XARELTO) 20 MG TABS tablet TAKE 1 TABLET DAILY WITH SUPPER 90 tablet 1   No current facility-administered medications on file prior to visit.      Family Hx: The patient's family history includes AAA (abdominal aortic aneurysm) in his father; Arthritis in his mother; Parkinson's disease in his mother.  ROS:   Please see the history of present illness.    Review of Systems  Constitutional: Positive for malaise/fatigue.  HENT: Negative.   Respiratory: Negative.   Cardiovascular: Negative.   Gastrointestinal: Negative.   Musculoskeletal: Negative.   Neurological: Negative.   Psychiatric/Behavioral: Negative.   All other systems reviewed and are negative.    Labs/Other Tests and Data Reviewed:    Recent Labs: 03/12/2019: ALT 18; BUN 21; Creatinine, Ser 0.90; Hemoglobin 14.8; Platelets 144; Potassium 4.2; Sodium 139   Recent Lipid Panel Lab Results  Component Value Date/Time   CHOL 123 01/30/2019 07:34 AM   TRIG 293.0 (H) 01/30/2019 07:34 AM   HDL 31.50 (L) 01/30/2019 07:34 AM   CHOLHDL 4  01/30/2019 07:34 AM   LDLDIRECT 42.0 01/30/2019 07:34 AM    Wt Readings from Last 3 Encounters:  03/12/19 205 lb (93 kg)  02/13/19 212 lb 8 oz (96.4 kg)  02/06/19 212 lb 12 oz (96.5 kg)     Exam:    Vital Signs: Vital signs may also be detailed in the HPI There were no vitals taken for this visit.  Wt Readings from Last 3 Encounters:  03/12/19 205 lb (93 kg)  02/13/19 212 lb 8 oz (96.4 kg)  02/06/19 212 lb 12 oz (96.5 kg)   Temp Readings from Last 3 Encounters:  03/12/19 98.1 F (36.7 C) (Oral)  02/06/19 97.8 F (36.6 C) (Temporal)  12/01/18 98.8 F (37.1 C) (Temporal)   BP Readings from Last 3 Encounters:  03/12/19 138/89  02/13/19 110/70  02/06/19 116/70   Pulse Readings from Last 3 Encounters:  03/12/19 63  02/13/19 71  02/06/19 70    140/80 rate 70 respirations 16  Well nourished, well developed male in no acute distress. Constitutional:  oriented to person, place, and time. No distress.    ASSESSMENT & PLAN:    Paroxysmal atrial fibrillation (HCC) -  Stay on metoprolol Continue anticoagulation  SVT (supraventricular tachycardia) (HCC) - No recent events Stay pon same medications  Aortic atherosclerosis ([place])  Stressed importance of low weight, aggressive lipid management Seen on CT scan  PAD (peripheral artery disease) (HCC) -  No further work-up at this time Cholesterol is at goal on the current lipid regimen. No changes to the medications were made.  Smoking history -  Smoking cessation recommended  Chronic fatigue Suggested he start exercise program   COVID-19 Education: The signs and symptoms of COVID-19 were discussed with the patient and how to seek care for testing (follow up with PCP or arrange E-visit).  The importance of social distancing was discussed today.  Patient Risk:   After full review of this patients clinical status, I feel that they are at least moderate risk at this time.  Time:   Today, I have spent 25 minutes with  the patient with telehealth technology discussing the cardiac and medical problems/diagnoses detailed above   10 min spent reviewing the chart prior to patient visit today   Medication Adjustments/Labs and Tests Ordered: Current medicines are reviewed at length with the patient today.  Concerns regarding medicines are outlined above.   Tests Ordered: No tests ordered   Medication Changes:  No changes made   Disposition: Follow-up in 6 months   Signed, Ida Rogue, MD  03/16/2019 1:40 PM    [place] Office [address] [place] [address], [place], [place] [address]

## 2019-03-18 ENCOUNTER — Other Ambulatory Visit: Payer: Self-pay

## 2019-03-18 ENCOUNTER — Telehealth (INDEPENDENT_AMBULATORY_CARE_PROVIDER_SITE_OTHER): Payer: Medicare Other | Admitting: Cardiovascular Disease

## 2019-03-18 ENCOUNTER — Encounter: Payer: Self-pay | Admitting: Cardiovascular Disease

## 2019-03-18 VITALS — BP 135/83 | HR 50 | Ht 70.0 in | Wt 209.0 lb

## 2019-03-18 DIAGNOSIS — I714 Abdominal aortic aneurysm, without rupture, unspecified: Secondary | ICD-10-CM

## 2019-03-18 DIAGNOSIS — E782 Mixed hyperlipidemia: Secondary | ICD-10-CM

## 2019-03-18 DIAGNOSIS — Z87891 Personal history of nicotine dependence: Secondary | ICD-10-CM

## 2019-03-18 DIAGNOSIS — R079 Chest pain, unspecified: Secondary | ICD-10-CM | POA: Diagnosis not present

## 2019-03-18 DIAGNOSIS — G8929 Other chronic pain: Secondary | ICD-10-CM

## 2019-03-18 DIAGNOSIS — R5382 Chronic fatigue, unspecified: Secondary | ICD-10-CM | POA: Diagnosis not present

## 2019-03-18 DIAGNOSIS — I700 Atherosclerosis of aorta: Secondary | ICD-10-CM

## 2019-03-18 DIAGNOSIS — I739 Peripheral vascular disease, unspecified: Secondary | ICD-10-CM | POA: Diagnosis not present

## 2019-03-18 DIAGNOSIS — I480 Paroxysmal atrial fibrillation: Secondary | ICD-10-CM

## 2019-03-18 DIAGNOSIS — M546 Pain in thoracic spine: Secondary | ICD-10-CM | POA: Diagnosis not present

## 2019-03-18 DIAGNOSIS — I471 Supraventricular tachycardia: Secondary | ICD-10-CM

## 2019-03-18 NOTE — Patient Instructions (Signed)
Medication Instructions:  No changes  If you need a refill on your cardiac medications before your next appointment, please call your pharmacy.    Lab work: No new labs needed   If you have labs (blood work) drawn today and your tests are completely normal, you will receive your results only by: Marland Kitchen MyChart Message (if you have MyChart) OR . A paper copy in the mail If you have any lab test that is abnormal or we need to change your treatment, we will call you to review the results.   Testing/Procedures: No new testing needed   Follow-Up: At [facility], you and your health needs are our priority.  As part of our continuing mission to provide you with exceptional heart care, we have created designated Provider Care Teams.  These Care Teams include your primary Cardiologist (physician) and Advanced Practice Providers (APPs -  Physician Assistants and Nurse Practitioners) who all work together to provide you with the care you need, when you need it.  . You will need a follow up appointment in 6 month  . Providers on your designated Care Team:   . Murray Hodgkins, NP . Christell Faith, PA-C . Marrianne Mood, PA-C  Any Other Special Instructions Will Be Listed Below (If Applicable).  For educational health videos Log in to : [email or web address] Or : [email or web address], password : [email or web address]

## 2019-03-19 ENCOUNTER — Other Ambulatory Visit (HOSPITAL_COMMUNITY): Payer: Medicare Other

## 2019-03-19 DIAGNOSIS — M545 Low back pain: Secondary | ICD-10-CM | POA: Diagnosis not present

## 2019-03-23 ENCOUNTER — Ambulatory Visit (INDEPENDENT_AMBULATORY_CARE_PROVIDER_SITE_OTHER): Payer: Medicare Other

## 2019-03-23 ENCOUNTER — Other Ambulatory Visit: Payer: Self-pay

## 2019-03-23 DIAGNOSIS — I723 Aneurysm of iliac artery: Secondary | ICD-10-CM

## 2019-03-23 DIAGNOSIS — I714 Abdominal aortic aneurysm, without rupture, unspecified: Secondary | ICD-10-CM

## 2019-03-27 ENCOUNTER — Encounter (INDEPENDENT_AMBULATORY_CARE_PROVIDER_SITE_OTHER): Payer: Medicare Other | Admitting: Vascular Surgery

## 2019-04-13 DIAGNOSIS — M5136 Other intervertebral disc degeneration, lumbar region: Secondary | ICD-10-CM | POA: Diagnosis not present

## 2019-04-13 DIAGNOSIS — M47816 Spondylosis without myelopathy or radiculopathy, lumbar region: Secondary | ICD-10-CM | POA: Diagnosis not present

## 2019-04-13 DIAGNOSIS — M62830 Muscle spasm of back: Secondary | ICD-10-CM | POA: Diagnosis not present

## 2019-04-15 ENCOUNTER — Other Ambulatory Visit: Payer: Medicare Other

## 2019-04-22 DIAGNOSIS — R58 Hemorrhage, not elsewhere classified: Secondary | ICD-10-CM | POA: Diagnosis not present

## 2019-04-22 DIAGNOSIS — X32XXXA Exposure to sunlight, initial encounter: Secondary | ICD-10-CM | POA: Diagnosis not present

## 2019-04-22 DIAGNOSIS — L570 Actinic keratosis: Secondary | ICD-10-CM | POA: Diagnosis not present

## 2019-04-22 DIAGNOSIS — C4441 Basal cell carcinoma of skin of scalp and neck: Secondary | ICD-10-CM | POA: Diagnosis not present

## 2019-04-22 DIAGNOSIS — D485 Neoplasm of uncertain behavior of skin: Secondary | ICD-10-CM | POA: Diagnosis not present

## 2019-04-22 DIAGNOSIS — Z08 Encounter for follow-up examination after completed treatment for malignant neoplasm: Secondary | ICD-10-CM | POA: Diagnosis not present

## 2019-04-22 DIAGNOSIS — Z85828 Personal history of other malignant neoplasm of skin: Secondary | ICD-10-CM | POA: Diagnosis not present

## 2019-04-22 DIAGNOSIS — D0461 Carcinoma in situ of skin of right upper limb, including shoulder: Secondary | ICD-10-CM | POA: Diagnosis not present

## 2019-05-20 ENCOUNTER — Other Ambulatory Visit: Payer: Self-pay | Admitting: Primary Care

## 2019-05-20 DIAGNOSIS — E782 Mixed hyperlipidemia: Secondary | ICD-10-CM

## 2019-05-20 DIAGNOSIS — C4441 Basal cell carcinoma of skin of scalp and neck: Secondary | ICD-10-CM | POA: Diagnosis not present

## 2019-05-20 DIAGNOSIS — D0462 Carcinoma in situ of skin of left upper limb, including shoulder: Secondary | ICD-10-CM | POA: Diagnosis not present

## 2019-05-20 DIAGNOSIS — I700 Atherosclerosis of aorta: Secondary | ICD-10-CM

## 2019-05-20 MED ORDER — ATORVASTATIN CALCIUM 20 MG PO TABS: 20.0000 mg | ORAL_TABLET | Freq: Every day | ORAL | 1 refills | Status: DC

## 2019-05-21 ENCOUNTER — Other Ambulatory Visit: Payer: Self-pay | Admitting: Cardiovascular Disease

## 2019-05-21 NOTE — Telephone Encounter (Signed)
Refill Request.  

## 2019-06-09 ENCOUNTER — Ambulatory Visit: Payer: Medicare Other

## 2019-06-18 ENCOUNTER — Ambulatory Visit: Payer: Medicare Other

## 2019-07-15 ENCOUNTER — Ambulatory Visit (INDEPENDENT_AMBULATORY_CARE_PROVIDER_SITE_OTHER)
Admission: RE | Admit: 2019-07-15 | Discharge: 2019-07-15 | Disposition: A | Discharge status: Home / Self Care | Payer: Medicare Other | Source: Ambulatory Visit | Attending: Primary Care | Admitting: Primary Care

## 2019-07-15 ENCOUNTER — Other Ambulatory Visit: Payer: Self-pay

## 2019-07-15 ENCOUNTER — Encounter: Payer: Self-pay | Admitting: Primary Care

## 2019-07-15 ENCOUNTER — Ambulatory Visit (INDEPENDENT_AMBULATORY_CARE_PROVIDER_SITE_OTHER): Payer: Medicare Other | Admitting: Primary Care

## 2019-07-15 VITALS — BP 116/70 | HR 69 | Temp 96.9°F | Ht 69.5 in | Wt 211.5 lb

## 2019-07-15 DIAGNOSIS — R413 Other amnesia: Secondary | ICD-10-CM | POA: Diagnosis not present

## 2019-07-15 DIAGNOSIS — M25559 Pain in unspecified hip: Secondary | ICD-10-CM

## 2019-07-15 DIAGNOSIS — M160 Bilateral primary osteoarthritis of hip: Secondary | ICD-10-CM | POA: Diagnosis not present

## 2019-07-15 LAB — BASIC METABOLIC PANEL
BUN: 21 mg/dL (ref 6–23)
CO2: 25 mEq/L (ref 19–32)
Calcium: 9.8 mg/dL (ref 8.4–10.5)
Chloride: 106 mEq/L (ref 96–112)
Creatinine, Ser: 0.85 mg/dL (ref 0.40–1.50)
GFR: 87.29 mL/min (ref >60.00)
Glucose, Bld: 107 mg/dL — ABNORMAL HIGH (ref 70–99)
Potassium: 4 mEq/L (ref 3.5–5.1)
Sodium: 137 mEq/L (ref 135–145)

## 2019-07-15 LAB — CBC
HCT: 41.4 % (ref 39.0–52.0)
Hemoglobin: 14.5 g/dL (ref 13.0–17.0)
MCHC: 35 g/dL (ref 30.0–36.0)
MCV: 96.7 fl (ref 78.0–100.0)
Platelets: 134 10*3/uL — ABNORMAL LOW (ref 150.0–400.0)
RBC: 4.28 Mil/uL (ref 4.22–5.81)
RDW: 13.2 % (ref 11.5–15.5)
WBC: 4.3 10*3/uL (ref 4.0–10.5)

## 2019-07-15 LAB — VITAMIN B12: Vitamin B-12: 478 pg/mL (ref 211–911)

## 2019-07-15 LAB — TSH: TSH: 1.93 u[IU]/mL (ref 0.35–4.50)

## 2019-07-15 MED ORDER — DONEPEZIL HCL 5 MG PO TABS: 5.0000 mg | ORAL_TABLET | Freq: Every day | ORAL | 0 refills | Status: DC

## 2019-07-15 NOTE — Patient Instructions (Signed)
Stop by the front desk and speak with either Rosaria Ferries or Charmaine regarding your referral to neurology.  Start donepezil 5 mg for memory. This is taken at bedtime.  Stop by the lab and xray prior to leaving today. I will notify you of your results once received.   Please touch base with the physical medicine doctor through [facility].  It was a pleasure to see you today!

## 2019-07-15 NOTE — Assessment & Plan Note (Signed)
Acute on chronic flare with decrease in ROM and altered gait. Exam today with decrease in ROM mostly to right side, altered gait noted.  Check plain films bilaterally. Discussed that he ultimately needs to meet back up with his physical medicine doctor, may need more PT. He will notify his wife.

## 2019-07-15 NOTE — Assessment & Plan Note (Addendum)
Continued and obvious decline since our last visit, but MMSE score of 30 today.  Discussed options for treatment today, I've also had this conversation with his wife during prior visits. They are ready to pursue treatment and agree to low dose donepezil.  I also referred him to neurology for further input and full evaluation per patient request.   We will check labs today including B12, CBC.

## 2019-07-15 NOTE — Progress Notes (Signed)
Subjective:    Patient ID: Adam Juarez, male    DOB: 1940-11-13, 79 y.o.   MRN: [id number]  HPI  This visit occurred during the SARS-CoV-2 public health emergency.  Safety protocols were in place, including screening questions prior to the visit, additional usage of staff PPE, and extensive cleaning of exam room while observing appropriate contact time as indicated for disinfecting solutions.   Adam Juarez is a 79 year old male with a history of AAA, aortic atherosclerosis, osteoarthritis, DDD of lumbar spine, spondylosis of lumbar spine, chronic foot and back pain, chronic fatigue, tobacco abuse, memory changes who presents today with a chief complaint of pain and memory changes.  1) Chronic Pain: His pain continues to exist to the lower back, but has noticed an increased in pain to his bilateral hips with decrease in ROM and difficulty walking.   He continues to remain sedentary for most of his day, watches TV mostly. He does walk 1/3 of a mile daily, weather permitting.   He denies injury/trauma, numbness/tingling. He has noticed a balance problem with his legs, has stumbled some. He does not use his cane.   He underwent xrays of the lumbar spine in November 2020 which showed severe multilevel degenerative changes mostly to L5-S1. He underwent MRI of the lumbar spine in January 2021 with L5-S1 mild to moderate space narrowing with broad-based disc bulging, no foraminal stenosis. Also with L4-5 disc protrusion, L3-4 disc bulging with moderate bilateral facet arthropathy, L2-3 disc desiccation with bulge, L1-2 mild disc desiccation without stenosis.   Last evaluated by physical medicine in late November 2020, plan at that time was to continue conservative treatment with Tylenol, heat, online physical therapy. It was recommended that he return for L5-S1 facet joint injection with prednisone taper if flare occurred.   2) Memory changes: Chronic and gradually progressing over the last 1-2 years.  For example he couldn't remember the type of bread that he was going to buy at the store today but remembered several hours later. He recently couldn't remember the name of his very close friend. Memories will come back to him several hours after he was trying to remember. He sometimes has trouble with finding words for his sentences, this has been ongoing for quite some time.   He denies unilateral weakness, dizziness, acute visual changes.   Review of Systems  Musculoskeletal: Positive for arthralgias and back pain.  Skin: Negative for color change.  Neurological: Negative for dizziness and numbness.       See HPI       Past Medical History:  Diagnosis Date  . Arthritis    fingers  . 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 Chicken pox   . Dysrhythmia 09/2013   Hx. a-fib x 1 episode patient states he "auto corrected" seen [facility]  . GERD (gastroesophageal reflux disease)   . Headache    poor posture - none recently  . PSVT (paroxysmal supraventricular tachycardia) ([place]) 2009   Controlled with "breathing process"  . Renal stone 9/08, 6/15  . Wears dentures    full upper     Social History   Socioeconomic History  . Marital status: Married    Spouse name: Not on file  . Number of children: Not on file  . Years of education: Not on file  . Highest education level: Not on file  Occupational History  . Not on file  Tobacco Use  . Smoking status: Former Smoker  Packs/day: 0.50    Years: 38.00    Pack years: 19.00    Types: Cigarettes    Quit date: 07/13/1997    Years since quitting: 22.0  . Smokeless tobacco: Never Used  Substance and Sexual Activity  . Alcohol use: No  . Drug use: No  . Sexual activity: Not on file  Other Topics Concern  . Not on file  Social History Narrative  . Not on file   Social Determinants of Health   Financial Resource Strain:   . Difficulty of Paying Living Expenses: Not on file  Food Insecurity:   . Worried  About Charity fundraiser in the Last Year: Not on file  . Ran Out of Food in the Last Year: Not on file  Transportation Needs:   . Lack of Transportation (Medical): Not on file  . Lack of Transportation (Non-Medical): Not on file  Physical Activity:   . Days of Exercise per Week: Not on file  . Minutes of Exercise per Session: Not on file  Stress:   . Feeling of Stress : Not on file  Social Connections:   . Frequency of Communication with Friends and Family: Not on file  . Frequency of Social Gatherings with Friends and Family: Not on file  . Attends Religious Services: Not on file  . Active Member of Clubs or Organizations: Not on file  . Attends Archivist Meetings: Not on file  . Marital Status: Not on file  Intimate Partner Violence:   . Fear of Current or Ex-Partner: Not on file  . Emotionally Abused: Not on file  . Physically Abused: Not on file  . Sexually Abused: Not on file    Past Surgical History:  Procedure Laterality Date  . CARDIAC CATHETERIZATION  [address], [place]  . CATARACT EXTRACTION W/PHACO Left 07/27/2015   Procedure: CATARACT EXTRACTION PHACO AND INTRAOCULAR LENS PLACEMENT (IOC);  Surgeon: Leandrew Koyanagi, MD;  Location: [facility];  Service: Ophthalmology;  Laterality: Left;  TORIC  . CATARACT EXTRACTION W/PHACO Right 08/24/2015   Procedure: CATARACT EXTRACTION PHACO AND INTRAOCULAR LENS PLACEMENT (IOC);  Surgeon: Leandrew Koyanagi, MD;  Location: [facility];  Service: Ophthalmology;  Laterality: Right;  TORIC  . HEMORROIDECTOMY  2010   banding  . KIDNEY STONE SURGERY  9/08, 6/15   lithotripsy  . PROSTATE BIOPSY  2007  . SKIN CANCER EXCISION    . TOENAIL TRIMMING  11/2017   Dr. Elvina Mattes  . VASECTOMY      Family History  Problem Relation Age of Onset  . AAA (abdominal aortic aneurysm) Father   . Parkinson's disease Mother   . Arthritis Mother     Allergies  Allergen Reactions  . Other Anaphylaxis and  Hives    BEE STINGS BEE STINGS BEE STINGS    Current Outpatient Medications on File Prior to Visit  Medication Sig Dispense Refill  . atorvastatin (LIPITOR) 20 MG tablet Take 1 tablet (20 mg total) by mouth daily. For cholesterol. 90 tablet 1  . Coenzyme Q10 (COQ10) 200 MG CAPS Take 200 mg by mouth daily.    Marland Kitchen diltiazem (CARDIZEM) 30 MG tablet Take 1 tablet (30 mg total) by mouth 3 (three) times daily as needed (As needed for tachycardia). 90 tablet 3  . Magnesium 500 MG CAPS Take 500 mg by mouth daily.    . Magnesium Cl-Calcium Carbonate (SLOW-MAG PO) Take by mouth daily.    . metoprolol succinate (TOPROL XL) 25 MG  24 hr tablet Take 1 tablet (25 mg total) by mouth 2 (two) times a day. 180 tablet 3  . Multiple Vitamins-Minerals (PRESERVISION AREDS 2 PO) Take by mouth 2 (two) times daily.    Marland Kitchen omeprazole (PRILOSEC) 20 MG capsule TAKE 1 CAPSULE DAILY 90 capsule 1  . Polyethyl Glycol-Propyl Glycol (SYSTANE OP) Apply 1 drop to eye 3 (three) times daily.    . Probiotic Product (PROBIOTIC DAILY PO) Take by mouth daily.     Alveda Reasons 20 MG TABS tablet TAKE 1 TABLET DAILY WITH SUPPER 90 tablet 3   No current facility-administered medications on file prior to visit.    BP 116/70   Pulse 69   Temp (!) 96.9 F (36.1 C) (Temporal)   Ht 5' 9.5" (1.765 m)   Wt 211 lb 8 oz (95.9 kg)   SpO2 95%   BMI 30.79 kg/m    Objective:   Physical Exam  Constitutional: He is oriented to person, place, and time. He appears well-nourished.  Respiratory: Effort normal.  Musculoskeletal:     Right hip: Decreased range of motion. Normal strength.     Left hip: Normal range of motion. Normal strength.     Comments: Altered gait initially when standing, mild stumble. Decrease in ROM more so to right hip with flexion and external rotation.   Neurological: He is alert and oriented to person, place, and time. No cranial nerve deficit.  He did have some trouble with word finding, was able to eventually find the  correct word. MMSE score of 30. Cannot remember details from his recent past medical history including physical medicine visits.  Skin: Skin is warm and dry.           Assessment & Plan:

## 2019-07-16 ENCOUNTER — Other Ambulatory Visit: Payer: Self-pay | Admitting: Primary Care

## 2019-08-03 DIAGNOSIS — H353132 Nonexudative age-related macular degeneration, bilateral, intermediate dry stage: Secondary | ICD-10-CM | POA: Diagnosis not present

## 2019-08-13 ENCOUNTER — Other Ambulatory Visit: Payer: Self-pay | Admitting: Primary Care

## 2019-08-19 ENCOUNTER — Ambulatory Visit: Payer: Medicare Other | Admitting: Neurology

## 2019-08-21 ENCOUNTER — Telehealth: Payer: Self-pay | Admitting: Internal Medicine

## 2019-08-21 NOTE — Telephone Encounter (Signed)
Notified of call tonight from patients wife Rod Holler. Page states pt went out of town and forgot metoprolol. Requesting short-term prescription.   Returned call to wife multiple time and tried his number as well with no answer.

## 2019-08-25 ENCOUNTER — Ambulatory Visit: Payer: Medicare Other | Admitting: Neurology

## 2019-08-30 ENCOUNTER — Other Ambulatory Visit: Payer: Self-pay | Admitting: Cardiovascular Disease

## 2019-09-08 ENCOUNTER — Other Ambulatory Visit: Payer: Self-pay

## 2019-09-08 ENCOUNTER — Ambulatory Visit (INDEPENDENT_AMBULATORY_CARE_PROVIDER_SITE_OTHER): Payer: Medicare Other | Admitting: Neurology

## 2019-09-08 ENCOUNTER — Telehealth: Payer: Self-pay | Admitting: Neurology

## 2019-09-08 ENCOUNTER — Encounter: Payer: Self-pay | Admitting: Neurology

## 2019-09-08 VITALS — BP 114/74 | HR 72 | Temp 97.3°F | Ht 70.0 in | Wt 210.0 lb

## 2019-09-08 DIAGNOSIS — R0683 Snoring: Secondary | ICD-10-CM

## 2019-09-08 DIAGNOSIS — I480 Paroxysmal atrial fibrillation: Secondary | ICD-10-CM | POA: Diagnosis not present

## 2019-09-08 DIAGNOSIS — R351 Nocturia: Secondary | ICD-10-CM | POA: Diagnosis not present

## 2019-09-08 DIAGNOSIS — R413 Other amnesia: Secondary | ICD-10-CM | POA: Diagnosis not present

## 2019-09-08 NOTE — Patient Instructions (Addendum)
You have complaints of memory loss: memory loss or changes in cognitive function can have many reasons and does not always mean you have dementia. Conditions that can contribute to subjective or objective memory loss include: depression, stress, poor sleep from insomnia or sleep apnea, dehydration, fluctuation in blood sugar values, thyroid or electrolyte dysfunction and certain vitamin deficiencies. Dementia can be caused by stroke, brain atherosclerosis or brain vascular disease due to vascular risk factors (smoking, high blood pressure, high cholesterol, obesity and uncontrolled diabetes), certain degenerative brain disorders (including Parkinson's disease and Multiple sclerosis) and by Alzheimer's disease or other, more rare and sometimes hereditary causes. We will do some additional testing, blood work has been done recently already.   We will do a brain scan. We will not start medication as yet.  I recommend a sleep study to rule out obstructive sleep apnea.  If you would like to pursue a sleep study closer to home, please talk to your primary care nurse practitioner about a referral to a sleep specialist.  If you change your mind about pursuing your sleep study here, I will be happy to order a sleep study for our sleep lab.   We will call you with brain scan test results and monitor your symptoms.   Please try to hydrate better with water, 6 to 8 cups/day are recommended, 8 ounces each.  Please try to exercise on a regular basis, such as walking.
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
## 2019-09-08 NOTE — Telephone Encounter (Signed)
Medicare/tricare order sent to GI. No auth they will reach out to the patient to schedule.  

## 2019-09-10 ENCOUNTER — Encounter: Payer: Self-pay | Admitting: Neurology

## 2019-09-10 ENCOUNTER — Telehealth: Payer: Self-pay | Admitting: Neurology

## 2019-09-10 DIAGNOSIS — R413 Other amnesia: Secondary | ICD-10-CM

## 2019-09-10 DIAGNOSIS — I700 Atherosclerosis of aorta: Secondary | ICD-10-CM

## 2019-09-10 DIAGNOSIS — I480 Paroxysmal atrial fibrillation: Secondary | ICD-10-CM

## 2019-09-10 NOTE — Telephone Encounter (Signed)
Phone rep checked office voicemail's,@ 10:46 pt left a message re: him being contacted by Radiology for an appointment and he would like a call from RN to discuss.

## 2019-09-10 NOTE — Telephone Encounter (Signed)
I called pt. No answer, left a message asking pt to call me back.  If pt calls back please inquire if this is in regards to scheduling his MRI if so pt would need to call [facility]. She information below.   Per Astrid Divine   09/08/19 11:34 AM    Medicare/tricare order sent to [facility]. No auth they will reach out to the patient to schedule

## 2019-09-11 ENCOUNTER — Encounter: Payer: Self-pay | Admitting: Neurology

## 2019-09-14 ENCOUNTER — Telehealth: Payer: Self-pay | Admitting: Neurology

## 2019-09-14 NOTE — Telephone Encounter (Signed)
I have updated the pt via my chart message.

## 2019-09-14 NOTE — Telephone Encounter (Signed)
Please see my chart message for reference: As discussed during our visit, for sleep evaluation he is encouraged to talk to his primary care nurse practitioner or physician about a referral to a sleep specialist closer to home.  We should be able to schedule the MRI at a location of his choosing once we are good to go with the authorization and ready to schedule. Please update pt.

## 2019-09-14 NOTE — Progress Notes (Signed)
Date:  09/15/2019   ID:  [name], DOB Oct 06, 1940, MRN [id number]  Patient Location:  [place] [address]   Provider location:   [facility], [place] office  PCP:  Pleas Koch, NP  Cardiologist:  Arvid Right [facility]  Chief Complaint  Patient presents with  . other    6 month follow up. Meds reviewed by the pt. verbally. "doing well."      History of Present Illness:    Adam Juarez is a 79 y.o. 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 on CT scan in 2015 ,  paroxysmal atrial fibrillation,  prior Holter monitor August 2013 showing frequent PVCs,  frequent short runs of supraventricular tachycardia,  stress test at that time showing no ischemia September 2013,  4.1 cm infrarenal abdominal aortic aneurysm who presents for follow-up of his paroxysmal atrial fibrillation  Stress test end of 2020 no ischemia  BP low today, Does not drink fluids  Few weeks ago went out of state, Forgot meds, arrhythmia Back at home, restarted metoprolol and diltiazem  Sharp pain in left neck, every few weeks  EKG personally reviewed by myself on todays visit NSR rate 68, nonspecific ST ABN  Other past medical history reviewed In the ER with back pain, 03/12/19  CT AB 4.1 cm infrarenal abdominal aortic aneurysm without complicating features.  Descending and proximal sigmoid diverticulosis. 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 Cari Caraway   Through the stress, no arrhythmia  Stress test:03/02/2019 reviewed with him  There was no ST segment deviation noted during stress.  No T wave inversion was noted during stress.  Defect 1: There is a medium defect of mild severity present in the basal inferior and mid inferior location. This is likely due to diaphragm attenuation.  The study is normal.  This is a low risk  study.  The left ventricular ejection fraction is normal (55-65%).   Tolerating anticoagulation CHADS VASC 3  Other past medical history reviewed event monitor end of September for 30 days showing no significant atrial fibrillation.   CT scan abdomen June 2015   Moderate diffuse descending aorta disease extending into the iliac vessels     tachycardia in April 2017 Monitors heart rate on his watch Numerous episodes of tachycardia in March 2017. last in November 2016.  He was admitted to the hospital for atrial fibrillation 08/08/2013, notes indicating he converted to normal sinus rhythm through his hospital course. He was not discharged on medications as he declined beta blockers  echocardiogram August was 7 2013  showing normal LV function, mild LVH, a stock dysfunction, aortic valve sclerosis, normal right heart pressures  Holter monitor report August 2013 detailing 12,000 PVCs, 11% of total beat count. 55 supraventricular ectopies, no mention of the longest run   Prior CV studies:   The following studies were reviewed today:    Past Medical History:  Diagnosis Date  . Arthritis    fingers  . 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 Chicken pox   . Dysrhythmia 09/2013   Hx. a-fib x 1 episode patient states he "auto corrected" seen at [facility]  . GERD (gastroesophageal reflux disease)   . Headache    poor posture - none recently  . PSVT (paroxysmal supraventricular tachycardia) ([place]) 2009   Controlled with "breathing process"  .  Renal stone 9/08, 6/15  . Wears dentures    full upper   Past Surgical History:  Procedure Laterality Date  . CARDIAC CATHETERIZATION  [address] [place]  . CATARACT EXTRACTION W/PHACO Left 07/27/2015   Procedure: CATARACT EXTRACTION PHACO AND INTRAOCULAR LENS PLACEMENT (IOC);  Surgeon: Leandrew Koyanagi, MD;  Location: [place];  Service: Ophthalmology;  Laterality: Left;  TORIC  . CATARACT  EXTRACTION W/PHACO Right 08/24/2015   Procedure: CATARACT EXTRACTION PHACO AND INTRAOCULAR LENS PLACEMENT (IOC);  Surgeon: Leandrew Koyanagi, MD;  Location: [place];  Service: Ophthalmology;  Laterality: Right;  TORIC  . HEMORROIDECTOMY  2010   banding  . KIDNEY STONE SURGERY  9/08, 6/15   lithotripsy  . PROSTATE BIOPSY  2007  . SKIN CANCER EXCISION    . TOENAIL TRIMMING  11/2017   Dr. Elvina Mattes  . VASECTOMY       Current Meds  Medication Sig  . diltiazem (CARDIZEM) 30 MG tablet TAKE 1 TABLET THREE TIMES A DAY AS NEEDED FOR TACHYCARDIA  . Glucosamine HCl (GLUCOSAMINE PO) Take by mouth.  . Magnesium Cl-Calcium Carbonate (SLOW-MAG PO) Take by mouth daily.  . metoprolol succinate (TOPROL-XL) 25 MG 24 hr tablet Take 1 tablet (25 mg total) by mouth in the morning and at bedtime.  . Multiple Vitamins-Minerals (PRESERVISION AREDS 2 PO) Take by mouth 2 (two) times daily.  Marland Kitchen omeprazole (PRILOSEC) 20 MG capsule TAKE 1 CAPSULE DAILY  . Polyethyl Glycol-Propyl Glycol (SYSTANE OP) Apply 1 drop to eye 3 (three) times daily.  Marland Kitchen POTASSIUM PO Take by mouth.  . Probiotic Product (PROBIOTIC DAILY PO) Take by mouth daily.   Alveda Reasons 20 MG TABS tablet TAKE 1 TABLET DAILY WITH SUPPER     Allergies:   Other   Social History   Tobacco Use  . Smoking status: Former Smoker    Packs/day: 0.50    Years: 38.00    Pack years: 19.00    Types: Cigarettes    Quit date: 07/13/1997    Years since quitting: 22.1  . Smokeless tobacco: Never Used  Substance Use Topics  . Alcohol use: No  . Drug use: No     Current Outpatient Medications on File Prior to Visit  Medication Sig Dispense Refill  . diltiazem (CARDIZEM) 30 MG tablet TAKE 1 TABLET THREE TIMES A DAY AS NEEDED FOR TACHYCARDIA 90 tablet 0  . Glucosamine HCl (GLUCOSAMINE PO) Take by mouth.    . Magnesium Cl-Calcium Carbonate (SLOW-MAG PO) Take by mouth daily.    . metoprolol succinate (TOPROL-XL) 25 MG 24 hr tablet Take 1 tablet (25 mg  total) by mouth in the morning and at bedtime. 180 tablet 0  . Multiple Vitamins-Minerals (PRESERVISION AREDS 2 PO) Take by mouth 2 (two) times daily.    Marland Kitchen omeprazole (PRILOSEC) 20 MG capsule TAKE 1 CAPSULE DAILY 90 capsule 1  . Polyethyl Glycol-Propyl Glycol (SYSTANE OP) Apply 1 drop to eye 3 (three) times daily.    Marland Kitchen POTASSIUM PO Take by mouth.    . Probiotic Product (PROBIOTIC DAILY PO) Take by mouth daily.     Alveda Reasons 20 MG TABS tablet TAKE 1 TABLET DAILY WITH SUPPER 90 tablet 3   No current facility-administered medications on file prior to visit.     Family Hx: The patient's family history includes AAA (abdominal aortic aneurysm) in his father; Arthritis in his mother; Parkinson's disease in his mother.  ROS:   Please see the history of present  illness.    Review of Systems  Constitutional: Positive for malaise/fatigue.  HENT: Negative.   Respiratory: Negative.   Cardiovascular: Negative.   Gastrointestinal: Negative.   Musculoskeletal: Negative.   Neurological: Negative.   Psychiatric/Behavioral: Negative.   All other systems reviewed and are negative.    Labs/Other Tests and Data Reviewed:    Recent Labs: 03/12/2019: ALT 18 07/15/2019: BUN 21; Creatinine, Ser 0.85; Hemoglobin 14.5; Platelets 134.0; Potassium 4.0; Sodium 137; TSH 1.93   Recent Lipid Panel Lab Results  Component Value Date/Time   CHOL 123 01/30/2019 07:34 AM   TRIG 293.0 (H) 01/30/2019 07:34 AM   HDL 31.50 (L) 01/30/2019 07:34 AM   CHOLHDL 4 01/30/2019 07:34 AM   LDLDIRECT 42.0 01/30/2019 07:34 AM    Wt Readings from Last 3 Encounters:  09/15/19 208 lb 6 oz (94.5 kg)  09/08/19 210 lb (95.3 kg)  07/15/19 211 lb 8 oz (95.9 kg)     Exam:    Vital Signs: Vital signs may also be detailed in the HPI BP 100/60 (BP Location: Left Arm, Patient Position: Sitting, Cuff Size: Normal)   Pulse 68   Ht 5\' 10"  (1.778 m)   Wt 208 lb 6 oz (94.5 kg)   BMI 29.90 kg/m   Constitutional:  oriented to person,  place, and time. No distress.  HENT:  Head: Grossly normal Eyes:  no discharge. No scleral icterus.  Neck: No JVD, no carotid bruits  Cardiovascular: Regular rate and rhythm, no murmurs appreciated Pulmonary/Chest: Clear to auscultation bilaterally, no wheezes or rails Abdominal: Soft.  no distension.  no tenderness.  Musculoskeletal: Normal range of motion Neurological:  normal muscle tone. Coordination normal. No atrophy Skin: Skin warm and dry Psychiatric: normal affect, pleasant  ASSESSMENT & PLAN:    Paroxysmal atrial fibrillation (HCC) -  Stay on metoprolol Recommend he hold the diltiazem, blood pressure is low Drink some fluids Continue anticoagulation  SVT (supraventricular tachycardia) (HCC) - No recent events Uses his watch to monitor for arrhythmia  Aortic atherosclerosis ([place])  Stressed importance of low weight, aggressive lipid management Seen on CT scan Has follow-up with vascular surgery  PAD (peripheral artery disease) (HCC) -  AAA 4.1  Followed by vascular, repeat imaging late 2021  Smoking history -  Smoking cessation recommended  Chronic fatigue Long discussion with him about exercising daily He is having some memory issues, walking would help.  Discussed with him   Disposition: Follow-up in 12 months   Signed, Ida Rogue, MD  09/15/2019 9:17 AM    [place] [facility] [address] [place] [address], [place], [place] [address]

## 2019-09-14 NOTE — Telephone Encounter (Signed)
Patient is scheduled at [facility] for Thursday 09/24/19 arrival time is 4:30 pm. I left a voicemail informing the patient. I also left their number of [phone] incase he needed to r/s.

## 2019-09-14 NOTE — Telephone Encounter (Addendum)
Megan we are unable to send the pt to [facility] for his sleep study because, our doctors do not refer outside of our sleep lab. If the pt wishes to have sleep study at [facility] he will need to have his referring provider refer him to them. If he goes that route Dr. Rexene Alberts will not follow his care. We will not be able to assist him with anything if he has sleep study at a different location. Also, it looks like on 09/10/19 the pt's PCP went ahead and referred him to see a Pulmonologist in [place].

## 2019-09-15 ENCOUNTER — Other Ambulatory Visit: Payer: Self-pay

## 2019-09-15 ENCOUNTER — Encounter: Payer: Self-pay | Admitting: Cardiovascular Disease

## 2019-09-15 ENCOUNTER — Ambulatory Visit (INDEPENDENT_AMBULATORY_CARE_PROVIDER_SITE_OTHER): Payer: Medicare Other | Admitting: Cardiovascular Disease

## 2019-09-15 VITALS — BP 100/60 | HR 68 | Ht 70.0 in | Wt 208.4 lb

## 2019-09-15 DIAGNOSIS — Z87891 Personal history of nicotine dependence: Secondary | ICD-10-CM | POA: Diagnosis not present

## 2019-09-15 DIAGNOSIS — I714 Abdominal aortic aneurysm, without rupture, unspecified: Secondary | ICD-10-CM

## 2019-09-15 DIAGNOSIS — I700 Atherosclerosis of aorta: Secondary | ICD-10-CM

## 2019-09-15 DIAGNOSIS — E782 Mixed hyperlipidemia: Secondary | ICD-10-CM

## 2019-09-15 DIAGNOSIS — I471 Supraventricular tachycardia: Secondary | ICD-10-CM | POA: Diagnosis not present

## 2019-09-15 DIAGNOSIS — R079 Chest pain, unspecified: Secondary | ICD-10-CM | POA: Diagnosis not present

## 2019-09-15 DIAGNOSIS — I739 Peripheral vascular disease, unspecified: Secondary | ICD-10-CM

## 2019-09-15 DIAGNOSIS — I480 Paroxysmal atrial fibrillation: Secondary | ICD-10-CM

## 2019-09-15 NOTE — Patient Instructions (Addendum)
Drink fluids  Walk walk walk  Medication Instructions:  Hold the diltiazem, take as needed for arrhythmia Stay on metoprolol Monitor blood pressure   If you need a refill on your cardiac medications before your next appointment, please call your pharmacy.    Lab work: No new labs needed   If you have labs (blood work) drawn today and your tests are completely normal, you will receive your results only by: Marland Kitchen MyChart Message (if you have MyChart) OR . A paper copy in the mail If you have any lab test that is abnormal or we need to change your treatment, we will call you to review the results.   Testing/Procedures: Ultrasound in 1 year before your appointment.   Your physician has requested that you have an abdominal aorta duplex. During this test, an ultrasound is used to evaluate the aorta. Allow 30 minutes for this exam. Do not eat after midnight the day before and avoid carbonated beverages    Follow-Up: At [facility], you and your health needs are our priority.  As part of our continuing mission to provide you with exceptional heart care, we have created designated Provider Care Teams.  These Care Teams include your primary Cardiologist (physician) and Advanced Practice Providers (APPs -  Physician Assistants and Nurse Practitioners) who all work together to provide you with the care you need, when you need it.  . You will need a follow up appointment in 12 months .  Marland Kitchen Providers on your designated Care Team:   . Murray Hodgkins, NP . Christell Faith, PA-C . Marrianne Mood, PA-C  Any Other Special Instructions Will Be Listed Below (If Applicable).  For educational health videos Log in to : [email or web address] Or : [email or web address], password : [email or web address]

## 2019-09-24 ENCOUNTER — Other Ambulatory Visit: Payer: Self-pay

## 2019-09-24 ENCOUNTER — Ambulatory Visit
Admission: RE | Admit: 2019-09-24 | Discharge: 2019-09-24 | Disposition: A | Discharge status: Home / Self Care | Payer: Medicare Other | Source: Ambulatory Visit | Attending: Neurology | Admitting: Neurology

## 2019-09-24 DIAGNOSIS — R413 Other amnesia: Secondary | ICD-10-CM | POA: Diagnosis not present

## 2019-10-07 DIAGNOSIS — D2372 Other benign neoplasm of skin of left lower limb, including hip: Secondary | ICD-10-CM | POA: Diagnosis not present

## 2019-10-07 DIAGNOSIS — M79675 Pain in left toe(s): Secondary | ICD-10-CM | POA: Diagnosis not present

## 2019-10-07 DIAGNOSIS — L600 Ingrowing nail: Secondary | ICD-10-CM | POA: Diagnosis not present

## 2019-10-08 NOTE — Progress Notes (Signed)
Please call patient and advise him that his recent brain MRI without contrast showed a couple of abnormalities including a widened fluid space of the brain, this can be seen in a condition called normal pressure hydrocephalus, which means that there is fluid buildup around the brain.  Symptoms can include memory loss but also other symptoms such as gait disorder, falls and urinary incontinence.  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  I would like to discuss with him further evaluation including getting input from a interventional radiologist to see if we need to do any additional testing for the aneurysm.  Please arrange follow-up appointment.

## 2019-10-13 ENCOUNTER — Telehealth: Payer: Self-pay

## 2019-10-13 NOTE — Telephone Encounter (Signed)
Pt's wife called asking about a referral being sent over to [facility] in [place]. I informed her Dr. Rexene Alberts will not send the referral. They will need to contact his PCP. She verbalized understanding. She then asked me if the sleep study report will be sent back to Dr. Rexene Alberts. I informed her that if the pt has the sleep study at another sleep facility Dr. Rexene Alberts will not follow his care for sleep. Pt's wife verbalized understanding and stated she will contact his PCP for a referral.

## 2019-10-13 NOTE — Telephone Encounter (Signed)
-----   Message from Star Age, MD sent at 10/08/2019  5:29 PM EDT ----- Please call patient and advise him that his recent brain MRI without contrast showed a couple of abnormalities including a widened fluid space of the brain, this can be seen in a condition called normal pressure hydrocephalus, which means that there is fluid buildup around the brain.  Symptoms can include memory loss but also other symptoms such as gait disorder, falls and urinary incontinence.  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  I would like to discuss with him further evaluation including getting input from a interventional radiologist to see if we need to do any additional testing for the aneurysm.  Please arrange follow-up appointment.

## 2019-10-13 NOTE — Telephone Encounter (Signed)
I contacted the pt and advised of results.  Pt understood the results and Dr. Guadelupe Sabin recommendations. Pt reported he was currently waiting for another doctors appointment and would like to call/mychart message me back to schedule f/u. Pt will discuss findings with wife as well and then let me know when he is ready to schedule f/u.

## 2019-10-15 ENCOUNTER — Other Ambulatory Visit: Payer: Self-pay

## 2019-10-15 ENCOUNTER — Encounter: Payer: Self-pay | Admitting: Neurology

## 2019-10-15 ENCOUNTER — Ambulatory Visit (INDEPENDENT_AMBULATORY_CARE_PROVIDER_SITE_OTHER): Payer: Medicare Other | Admitting: Neurology

## 2019-10-15 VITALS — BP 127/82 | HR 67 | Ht 70.0 in | Wt 207.0 lb

## 2019-10-15 DIAGNOSIS — I671 Cerebral aneurysm, nonruptured: Secondary | ICD-10-CM

## 2019-10-15 DIAGNOSIS — I714 Abdominal aortic aneurysm, without rupture, unspecified: Secondary | ICD-10-CM

## 2019-10-15 DIAGNOSIS — R9089 Other abnormal findings on diagnostic imaging of central nervous system: Secondary | ICD-10-CM | POA: Diagnosis not present

## 2019-10-15 DIAGNOSIS — G9389 Other specified disorders of brain: Secondary | ICD-10-CM

## 2019-10-15 DIAGNOSIS — R413 Other amnesia: Secondary | ICD-10-CM | POA: Diagnosis not present
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
## 2019-10-15 NOTE — Patient Instructions (Signed)
We will continue to monitor your gait and balance and your memory.  I recommend you pursue a sleep study testing to rule out obstructive sleep apnea as untreated sleep apnea can cause memory loss and also you can increase your risk for A. Fib.  As discussed, we will proceed with a referral to interventional radiologist, Dr. Estanislado Pandy.  Please let us know if you do not hear from them directly for an appointment to be set up.  He may recommend further evaluation of your brain blood vessels with an angiogram.  If needed down the road, we can consider doing a spinal tap to evaluate you for normal pressure hydrocephalus.

## 2019-10-16 ENCOUNTER — Telehealth (HOSPITAL_COMMUNITY): Payer: Self-pay | Admitting: Radiology

## 2019-10-16 ENCOUNTER — Other Ambulatory Visit (HOSPITAL_COMMUNITY): Payer: Self-pay | Admitting: Interventional Radiology

## 2019-10-16 DIAGNOSIS — R9089 Other abnormal findings on diagnostic imaging of central nervous system: Secondary | ICD-10-CM

## 2019-10-16 DIAGNOSIS — I480 Paroxysmal atrial fibrillation: Secondary | ICD-10-CM | POA: Diagnosis not present

## 2019-10-16 DIAGNOSIS — G912 (Idiopathic) normal pressure hydrocephalus: Secondary | ICD-10-CM | POA: Diagnosis not present

## 2019-10-16 DIAGNOSIS — I671 Cerebral aneurysm, nonruptured: Secondary | ICD-10-CM

## 2019-10-16 DIAGNOSIS — I714 Abdominal aortic aneurysm, without rupture: Secondary | ICD-10-CM | POA: Diagnosis not present

## 2019-10-16 DIAGNOSIS — R4189 Other symptoms and signs involving cognitive functions and awareness: Secondary | ICD-10-CM | POA: Diagnosis not present

## 2019-10-16 NOTE — Telephone Encounter (Signed)
Called pt, left VM for him to call to schedule consult with Deveshwar for Tuesday, June 8th at 1 pm. Velora Mediate

## 2019-10-19 ENCOUNTER — Other Ambulatory Visit: Payer: Self-pay | Admitting: Adult Health

## 2019-10-19 ENCOUNTER — Other Ambulatory Visit: Payer: Self-pay

## 2019-10-19 ENCOUNTER — Ambulatory Visit (INDEPENDENT_AMBULATORY_CARE_PROVIDER_SITE_OTHER): Payer: Medicare Other | Admitting: Adult Health

## 2019-10-19 ENCOUNTER — Encounter: Payer: Self-pay | Admitting: Adult Health

## 2019-10-19 DIAGNOSIS — G4719 Other hypersomnia: Secondary | ICD-10-CM

## 2019-10-19 DIAGNOSIS — G4733 Obstructive sleep apnea (adult) (pediatric): Secondary | ICD-10-CM | POA: Insufficient documentation

## 2019-10-19 DIAGNOSIS — I480 Paroxysmal atrial fibrillation: Secondary | ICD-10-CM

## 2019-10-19 NOTE — Assessment & Plan Note (Signed)
A Fib hx -controlled on regimen  Risk factor for OSA  Proceed with sleep study

## 2019-10-19 NOTE — Progress Notes (Signed)
@Patient  ID: Adam Juarez, male    DOB: 23-Aug-1940, 79 y.o.   MRN: [id number]  Chief Complaint  Patient presents with  . Sleep Consult    Referring provider: Pleas Koch, NP  HPI: 79 year old male seen for sleep consult on October 19, 2019 for daytime sleepiness, mild snoring,  History significant for A. 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   . Patient is currently being evaluated for memory loss.There is concern for possible underlying sleep apnea.  He has positive daytime sleepiness and snoring.  Epworth score 14. Patient says he goes to bed typically around 11 PM takes about 10 minutes to go to sleep.  Usually wakes up about twice during the night.  Usually gets up in the morning around 630 or 7:00 AM. Does not feel refreshed.  Wakes himself up sleeping on back choking at times. Does not watch TV in bedroom. 1 cup of caffeine a day.  Denies any sleep paralysis , cateplexy, or narcolepsy symptoms .   Social history.  Patient is married.  Patient is retired .  Has 2 children.   Quit smoking age 79 . Grandchildren/great grand children.  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   Covid vaccine utd.   Echo 2018 showed preserved EF>     Allergies  Allergen Reactions  . Other Anaphylaxis and Hives    BEE STINGS BEE STINGS BEE STINGS    Immunization History  Administered Date(s) Administered  . Influenza,inj,Quad PF,6+ Mos 04/12/2017, 01/28/2018, 02/06/2019  . PFIZER SARS-COV-2 Vaccination 06/10/2019, 07/01/2019  . Pneumococcal Conjugate-13 01/10/2017  . Pneumococcal Polysaccharide-23 08/13/2010  . Tdap 06/13/2011  . Zoster 09/01/2010    Past Medical History:  Diagnosis Date  . Arthritis    fingers  . 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  Chicken pox   . Dysrhythmia 09/2013   Hx. a-fib x 1 episode patient states he "auto corrected" seen at [facility]  . GERD (gastroesophageal reflux disease)   . Headache    poor posture - none recently  . PSVT (paroxysmal supraventricular tachycardia) ([place]) 2009   Controlled with "breathing process"  . Renal stone 9/08, 6/15  . Wears dentures    full upper    Tobacco History: Social History   Tobacco Use  Smoking Status Former Smoker  . Packs/day: 0.50  . Years: 38.00  . Pack years: 19.00  . Types: Cigarettes  . Quit date: 07/13/1997  . Years since quitting: 22.2  Smokeless Tobacco Never Used   Counseling given: Not Answered   Outpatient Medications Prior to Visit  Medication Sig Dispense Refill  . Glucosamine HCl (GLUCOSAMINE PO) Take by mouth.    . Magnesium Cl-Calcium Carbonate (SLOW-MAG PO) Take by mouth daily.    . metoprolol succinate (TOPROL-XL) 25 MG 24 hr tablet Take 1 tablet (25 mg total) by mouth in the morning and at bedtime. 180 tablet 0  . Multiple Vitamins-Minerals (PRESERVISION AREDS 2 PO) Take by mouth 2 (two) times daily.    Marland Kitchen omeprazole (PRILOSEC) 20 MG capsule TAKE 1 CAPSULE DAILY 90 capsule 1  . Polyethyl Glycol-Propyl Glycol (SYSTANE OP) Apply 1 drop to eye 3 (three) times daily.    Marland Kitchen POTASSIUM PO Take by mouth.    . Probiotic Product (PROBIOTIC DAILY PO)  Take by mouth daily.     Alveda Reasons 20 MG TABS tablet TAKE 1 TABLET DAILY WITH SUPPER 90 tablet 3   No facility-administered medications prior to visit.     Review of Systems:   Constitutional:   No  weight loss, night sweats,  Fevers, chills, +fatigue, or  lassitude.  HEENT:   No headaches,  Difficulty swallowing,  Tooth/dental problems, or  Sore throat,                No sneezing, itching, ear ache, nasal congestion, post nasal drip,   CV:  No chest pain,  Orthopnea, PND, swelling in lower extremities, anasarca, dizziness, palpitations, syncope.   GI  No heartburn, indigestion,  abdominal pain, nausea, vomiting, diarrhea, change in bowel habits, loss of appetite, bloody stools.   Resp: No shortness of breath with exertion or at rest.  No excess mucus, no productive cough,  No non-productive cough,  No coughing up of blood.  No change in color of mucus.  No wheezing.  No chest wall deformity  Skin: no rash or lesions.  GU: no dysuria, change in color of urine, no urgency or frequency.  No flank pain, no hematuria   MS:  No joint pain or swelling.  No decreased range of motion.  No back pain.    Physical Exam  BP 120/82 (BP Location: Left Arm, Cuff Size: Normal)   Pulse 65   Temp 97.8 F (36.6 C) (Temporal)   Ht 5\' 10"  (1.778 m)   Wt 207 lb (93.9 kg)   SpO2 95% Comment: on RA  BMI 29.70 kg/m   GEN: A/Ox3; pleasant , NAD, well nourished    HEENT:  [place]/AT,  , NOSE-clear, THROAT-clear, no lesions, no postnasal drip or exudate noted. Class 2 MP airway   NECK:  Supple w/ fair ROM; no JVD; normal carotid impulses w/o bruits; no thyromegaly or nodules palpated; no lymphadenopathy.    RESP  Clear  P & A; w/o, wheezes/ rales/ or rhonchi. no accessory muscle use, no dullness to percussion  CARD:  RRR, no m/r/g, no peripheral edema, pulses intact, no cyanosis or clubbing.  GI:   Soft & nt; nml bowel sounds; no organomegaly or masses detected.   Musco: Warm bil, no deformities or joint swelling noted.   Neuro: alert, no focal deficits noted.    Skin: Warm, no lesions or rashes    Lab Results:  CBC    BNP  ProBNP No results found for: PROBNP  Imaging:     No flowsheet data found.  No results found for: NITRICOXIDE      Assessment & Plan:   Excessive daytime sleepiness Daytime sleepiness , snoring , hx of A Fib , - patient has risk factors for OSA  Would recommend sleep study . Prefers home sleep study, does not thing he will sleep at the sleep lap  Patient education on sleep apnea.   Plan  Patient Instructions  Please Set up home  sleep study .  Work on healthy weight .  Do not drive if sleepy .  Follow up with Dr. Mortimer Fries in 2-3 months and As needed       .     Rexene Edison, NP 10/19/2019

## 2019-10-19 NOTE — Assessment & Plan Note (Signed)
Daytime sleepiness , snoring , hx of A Fib , - patient has risk factors for OSA  Would recommend sleep study . Prefers home sleep study, does not thing he will sleep at the sleep lap  Patient education on sleep apnea.   Plan  Patient Instructions  Please Set up home sleep study .  Work on healthy weight .  Do not drive if sleepy .  Follow up with Dr. Mortimer Fries in 2-3 months and As needed       .

## 2019-10-19 NOTE — Patient Instructions (Signed)
Please Set up home sleep study .  Work on healthy weight .  Do not drive if sleepy .  Follow up with Dr. Mortimer Fries in 2-3 months and As needed

## 2019-10-19 NOTE — Progress Notes (Signed)
Reviewed and agree with assessment/plan.   Chesley Mires, MD [facility] Pulmonary/Critical Care 10/19/2019, 2:40 PM Pager:  [phone]

## 2019-10-20 ENCOUNTER — Ambulatory Visit (HOSPITAL_COMMUNITY)
Admission: RE | Admit: 2019-10-20 | Discharge: 2019-10-20 | Disposition: A | Discharge status: Home / Self Care | Payer: Medicare Other | Source: Ambulatory Visit | Attending: Interventional Radiology | Admitting: Interventional Radiology

## 2019-10-20 DIAGNOSIS — R9089 Other abnormal findings on diagnostic imaging of central nervous system: Secondary | ICD-10-CM

## 2019-10-20 DIAGNOSIS — Z91030 Bee allergy status: Secondary | ICD-10-CM

## 2019-10-20 DIAGNOSIS — I671 Cerebral aneurysm, nonruptured: Secondary | ICD-10-CM | POA: Diagnosis not present

## 2019-10-20 NOTE — Consult Note (Signed)
Chief Complaint: Patient was seen in consultation today for left ICA cavernous aneurysm.  Referring Physician(s): Star Age  Supervising Physician: Luanne Bras  Patient Status: [facility] - Out-pt  History of Present Illness: Adam Juarez is a 79 y.o. male with a past medical history as below, with pertinent past medical history including paroxysmal SVT, paroxysmal atrial fibrillation on chronic anticoagulation with Xarelto, AAA, headaches, GERD, nephrolithiasis, arthritis, and prior tobacco use. He was recently referred to neurology by his PCP for management of memory loss. He met with Dr. Rexene Alberts who ordered MR brain for futher evaluation of memory loss, which revealed an incidental finding of a left ICA cavernous aneurysm.  MR brain 09/24/2019: 1. No acute intracranial abnormality. 2. Prominence of the supratentorial ventricles with relative sparing of the high convexity sulci. 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 3. 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 A more focal fusiform dilatation is seen of the cavernous segment of the left ICA measuring up to 10 mm in diameter. 4. Scattered and confluent foci of T2 hyperintensity within the white matter of the cerebral hemisphere, more pronounced in the periventricular regions, nonspecific but likely represent chronic small vessel ischemic changes.  NIR consulted by Dr. Rexene Alberts for management of a left ICA cavernous aneurysm. Patient awake and alert sitting in chair. Accompanied by wife. Complains of progressive memory loss x months-years. Complains of urinary incontinence x months, stable. Complains of gait disturbances- per wife walks with arms wide at side due to fear of falling. Denies headaches, weakness, numbness/tingling, dizziness, vision changes, hearing changes, tinnitus, or speech difficulty.  Currently taking Xarelto 20 mg once  daily.   Past Medical History:  Diagnosis Date  . Arthritis    fingers  . 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 Chicken pox   . Dysrhythmia 09/2013   Hx. a-fib x 1 episode patient states he "auto corrected" seen at [facility]  . GERD (gastroesophageal reflux disease)   . Headache    poor posture - none recently  . PSVT (paroxysmal supraventricular tachycardia) ([place]) 2009   Controlled with "breathing process"  . Renal stone 9/08, 6/15  . Wears dentures    full upper    Past Surgical History:  Procedure Laterality Date  . CARDIAC CATHETERIZATION  [address], [place]  . CATARACT EXTRACTION W/PHACO Left 07/27/2015   Procedure: CATARACT EXTRACTION PHACO AND INTRAOCULAR LENS PLACEMENT (IOC);  Surgeon: Leandrew Koyanagi, MD;  Location: [place];  Service: Ophthalmology;  Laterality: Left;  TORIC  . CATARACT EXTRACTION W/PHACO Right 08/24/2015   Procedure: CATARACT EXTRACTION PHACO AND INTRAOCULAR LENS PLACEMENT (IOC);  Surgeon: Leandrew Koyanagi, MD;  Location: [place];  Service: Ophthalmology;  Laterality: Right;  TORIC  . HEMORROIDECTOMY  2010   banding  . KIDNEY STONE SURGERY  9/08, 6/15   lithotripsy  . PROSTATE BIOPSY  2007  . SKIN CANCER EXCISION    . TOENAIL TRIMMING  11/2017   Dr. Elvina Mattes  . VASECTOMY      Allergies: Other  Medications: Prior to Admission medications   Medication Sig Start Date End Date Taking? Authorizing Provider  Glucosamine HCl (GLUCOSAMINE PO) Take by mouth.    [provider]  Magnesium Cl-Calcium Carbonate (SLOW-MAG PO) Take by mouth daily.    [provider]  metoprolol succinate (TOPROL-XL) 25 MG 24 hr tablet Take 1 tablet (25 mg total) by mouth in the morning and at bedtime.  08/31/19   Minna Merritts, MD  Multiple Vitamins-Minerals (PRESERVISION AREDS 2 PO) Take by mouth 2 (two) times daily.    [provider]  omeprazole (PRILOSEC) 20 MG capsule TAKE 1 CAPSULE  DAILY 08/14/19   Pleas Koch, NP  Polyethyl Glycol-Propyl Glycol (SYSTANE OP) Apply 1 drop to eye 3 (three) times daily.    [provider]  POTASSIUM PO Take by mouth.    [provider]  Probiotic Product (PROBIOTIC DAILY PO) Take by mouth daily.     [provider]  XARELTO 20 MG TABS tablet TAKE 1 TABLET DAILY WITH SUPPER 05/21/19   Minna Merritts, MD     Family History  Problem Relation Age of Onset  . AAA (abdominal aortic aneurysm) Father   . Parkinson's disease Mother   . Arthritis Mother     Social History   Socioeconomic History  . Marital status: Married    Spouse name: Not on file  . Number of children: Not on file  . Years of education: Not on file  . Highest education level: Not on file  Occupational History  . Not on file  Tobacco Use  . Smoking status: Former Smoker    Packs/day: 0.50    Years: 38.00    Pack years: 19.00    Types: Cigarettes    Quit date: 07/13/1997    Years since quitting: 22.2  . Smokeless tobacco: Never Used  Substance and Sexual Activity  . Alcohol use: No  . Drug use: No  . Sexual activity: Not on file  Other Topics Concern  . Not on file  Social History Narrative  . Not on file   Social Determinants of Health   Financial Resource Strain:   . Difficulty of Paying Living Expenses:   Food Insecurity:   . Worried About Charity fundraiser in the Last Year:   . Arboriculturist in the Last Year:   Transportation Needs:   . Film/video editor (Medical):   Marland Kitchen Lack of Transportation (Non-Medical):   Physical Activity:   . Days of Exercise per Week:   . Minutes of Exercise per Session:   Stress:   . Feeling of Stress :   Social Connections:   . Frequency of Communication with Friends and Family:   . Frequency of Social Gatherings with Friends and Family:   . Attends Religious Services:   . Active Member of Clubs or Organizations:   . Attends Archivist Meetings:   Marland Kitchen Marital Status:       Review of Systems: A 12 point ROS discussed and pertinent positives are indicated in the HPI above.  All other systems are negative.  Review of Systems  Constitutional: Negative for chills and fever.  HENT: Negative for hearing loss and tinnitus.   Eyes: Negative for visual disturbance.  Respiratory: Negative for shortness of breath and wheezing.   Cardiovascular: Negative for chest pain and palpitations.  Genitourinary:       Positive for urinary incontinence.  Musculoskeletal: Positive for gait problem.  Neurological: Negative for dizziness, speech difficulty, weakness, numbness and headaches.  Psychiatric/Behavioral: Negative for confusion and decreased concentration.       Positive for memory loss.    Vital Signs: There were no vitals taken for this visit.  Physical Exam Constitutional:      General: He is not in acute distress.    Appearance: Normal appearance.  Pulmonary:     Effort: Pulmonary effort  is normal. No respiratory distress.  Skin:    General: Skin is warm and dry.  Neurological:     Mental Status: He is alert and oriented to person, place, and time.     Comments: No pronator drift.  Psychiatric:        Mood and Affect: Mood normal.        Behavior: Behavior normal.      Imaging: MR BRAIN WO CONTRAST  Result Date: 09/25/2019 CLINICAL DATA:  Memory loss. EXAM: MRI HEAD WITHOUT CONTRAST TECHNIQUE: Multiplanar, multiecho pulse sequences of the brain and surrounding structures were obtained without intravenous contrast. COMPARISON:  None. FINDINGS: Brain: No acute infarction, hemorrhage, extra-axial collection or mass lesion. The prominent dilatation of the supratentorial ventricles. There is prominence of the perisylvian sulci with relative sparing of the high convexity sulci. Scattered and confluent foci of T2 hyperintensity are seen within the white matter of the cerebral hemisphere, more pronounced in the periventricular regions, nonspecific. Vascular:  Flow voids are preserved. There is ectasias a of the basilar artery, bilateral ICA and MCA with fusiform aneurysm of the cavernous segment of the left ICA measuring up to 10 mm in diameter, causing mass effect on the pituitary gland with mild deviation of the pituitary stalk to the right side. Skull and upper cervical spine: Normal marrow signal. Sinuses/Orbits: Bilateral lens surgery. Mild mucosal thickening of the right maxillary sinus and bilateral ethmoid cells. IMPRESSION: 1. No acute intracranial abnormality. 2. Prominence of the supratentorial ventricles with relative sparing of the high convexity sulci. 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 3. 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 A more focal fusiform dilatation is seen of the cavernous segment of the left ICA measuring up to 10 mm in diameter. 4. Scattered and confluent foci of T2 hyperintensity within the white matter of the cerebral hemisphere, more pronounced in the periventricular regions, nonspecific but likely represent chronic small vessel ischemic changes . Electronically Signed   By: Pedro Earls M.D.   On: 09/25/2019 10:55    Labs:  CBC: Recent Labs    12/01/18 1115 02/06/19 1553 03/12/19 0806 07/15/19 1248  WBC 5.1 4.8 4.9 4.3  HGB 15.0 15.2 14.8 14.5  HCT 44.3 41.9 43.2 41.4  PLT 155.0 151 144* 134.0*    COAGS: No results for input(s): INR, APTT in the last 8760 hours.  BMP: Recent Labs    02/06/19 1553 03/12/19 0806 07/15/19 1248  NA 142 139 137  K 4.1 4.2 4.0  CL 107 103 106  CO2 _0 GLUCOSE 99 118* 107*  BUN _1 CALCIUM 9.5 9.5 9.8  CREATININE 0.85 0.90 0.85  GFRNONAA  --  >60  --   GFRAA  --  >60  --     LIVER FUNCTION TESTS: Recent Labs    01/30/19 0734 02/06/19 1553 03/12/19 0806  BILITOT 1.0 0.9 1.2  AST _2 ALT _3 ALKPHOS 58  --  61  PROT 6.9 6.7 7.7   ALBUMIN 4.4  --  4.5     Assessment and Plan:  Left ICA cavernous aneurysm, seen on MR brain 09/24/2019 during work-up for memory loss. Dr. Estanislado Pandy was present for consultation.  Discussed symptoms (gait disturbances, memory loss, urinary incontinence). Explained this could be due to normal pressure hydrocephalus.  Discussed MR results, specifically left ICA cavernous aneurysm. Explained this is an incidental finding- not cause of memory  issues. Discussed nature of aneurysms, including risk of rupture. Explained there are two management options moving forward- either continued conservative management including routine imaging scans to monitor for changes or with a procedure called a diagnostic cerebral arteriogram. Explained procedure including risks and benefits. Explained indication for this procedure is to accurately evaluate aneurysm and plan treatment. Explained he will need clearance from his cardiologist to hold Xarelto prior to procedure. Explained that once we get information on aneurysm, we will discuss with neurologist regarding treatment plans (specifically if we should treat aneurysm vs possible NPH first). Patient asks that he has time to go home and think about which management option to pursue. Plan for follow-up with an image-guided diagnostic cerebral arteriogram (first available) pending cardiology clearance VERSUS 6 month MRI/MRA brain/head (without contrast). Instructed patient to call our office once he decides which management option to pursue.  All questions answered and concerns addressed. Patient and wife convey understanding and agree with plan.  Thank you for this interesting consult.  I greatly enjoyed meeting Mohamadou Maciver and look forward to participating in their care.  A copy of this report was sent to the requesting provider on this date.  Electronically Signed: Earley Abide, PA-C 10/20/2019, 9:30 AM   I spent a total of 60 Minutes in face to face in  clinical consultation, greater than 50% of which was counseling/coordinating care for left ICA cavernous aneurysm.

## 2019-10-21 ENCOUNTER — Telehealth: Payer: Self-pay | Admitting: Cardiovascular Disease

## 2019-10-21 DIAGNOSIS — D485 Neoplasm of uncertain behavior of skin: Secondary | ICD-10-CM | POA: Diagnosis not present

## 2019-10-21 DIAGNOSIS — X32XXXA Exposure to sunlight, initial encounter: Secondary | ICD-10-CM | POA: Diagnosis not present

## 2019-10-21 DIAGNOSIS — L570 Actinic keratosis: Secondary | ICD-10-CM | POA: Diagnosis not present

## 2019-10-21 DIAGNOSIS — Z85828 Personal history of other malignant neoplasm of skin: Secondary | ICD-10-CM | POA: Diagnosis not present

## 2019-10-21 DIAGNOSIS — L821 Other seborrheic keratosis: Secondary | ICD-10-CM | POA: Diagnosis not present

## 2019-10-21 DIAGNOSIS — L820 Inflamed seborrheic keratosis: Secondary | ICD-10-CM | POA: Diagnosis not present

## 2019-10-21 DIAGNOSIS — Z08 Encounter for follow-up examination after completed treatment for malignant neoplasm: Secondary | ICD-10-CM | POA: Diagnosis not present

## 2019-10-21 MED ORDER — EPINEPHRINE 0.3 MG/0.3ML IJ SOAJ: 0.3000 mg | INTRAMUSCULAR | 0 refills | Status: DC | PRN

## 2019-10-21 NOTE — Telephone Encounter (Signed)
   [facility] Pre-operative Risk Assessment    HEARTCARE STAFF: - Please ensure there is not already an duplicate clearance open for this procedure. - Under Visit Info/Reason for Call, type in Other and utilize the format Clearance MM/DD/YY or Clearance TBD. Do not use dashes or single digits. - If request is for dental extraction, please clarify the # of teeth to be extracted.  Request for surgical clearance:  1. What type of surgery is being performed? Cerebral angiogram  2. When is this surgery scheduled? TBD - ASAP  3. What type of clearance is required (medical clearance vs. Pharmacy clearance to hold med vs. Both)? both  4. Are there any medications that need to be held prior to surgery and how long? Xarelto - will need to hold 48 hours prior to procedure - will patient need to be bridged?  5. Practice name and name of physician performing surgery? [facility] - Dr Luanne Bras  6. What is the office phone number? [phone]   7.   What is the office fax number? [phone]  8.   Anesthesia type (None, local, MAC, general) ? Not listed    Ace Gins 10/21/2019, 4:43 PM  _________________________________________________________________   (provider comments below)

## 2019-10-22 ENCOUNTER — Other Ambulatory Visit (HOSPITAL_COMMUNITY): Payer: Self-pay | Admitting: Interventional Radiology

## 2019-10-22 ENCOUNTER — Encounter (HOSPITAL_COMMUNITY): Payer: Self-pay

## 2019-10-22 DIAGNOSIS — I671 Cerebral aneurysm, nonruptured: Secondary | ICD-10-CM

## 2019-10-22 NOTE — Telephone Encounter (Signed)
   Primary Cardiologist: Ida Rogue, MD  Chart reviewed as part of pre-operative protocol coverage. Given past medical history and time since last visit, based on ACC/AHA guidelines, Adam Juarez would be at acceptable risk for the planned procedure without further cardiovascular testing.   Patient with diagnosis of afib on Xarelto for anticoagulation.    Procedure: cerebral angiogram Date of procedure: ASAP  CHADS2-VASc score of 3 (age x2, CAD) CrCl 58mL/min Platelet count 134K  His Eliquis may be held for 2 days prior to the procedure.  I will route this recommendation to the requesting party via Epic fax function and remove from pre-op pool.  Please call with questions.  [name] NP-C    10/22/2019, 11:11 AM [place] [address] Office [phone] Fax [phone]

## 2019-10-22 NOTE — Telephone Encounter (Signed)
Patient with diagnosis of afib on Xarelto for anticoagulation.    Procedure: cerebral angiogram Date of procedure: ASAP  CHADS2-VASc score of 3 (age x2, CAD) CrCl 17mL/min Platelet count 134K  Pt's wife sent in [place] message yesterday asking about pt holding Xarelto for 2 days, stated they could perform procedure on Friday. I provided clearance for pt to hold Xarelto for 2 days. He should already be holding his Xarelto for procedure if it's still scheduled for Friday.

## 2019-10-26 ENCOUNTER — Other Ambulatory Visit: Payer: Self-pay | Admitting: Student

## 2019-10-27 ENCOUNTER — Encounter (HOSPITAL_COMMUNITY): Payer: Self-pay

## 2019-10-27 ENCOUNTER — Other Ambulatory Visit: Payer: Self-pay

## 2019-10-27 ENCOUNTER — Telehealth: Payer: Self-pay | Admitting: Cardiovascular Disease

## 2019-10-27 ENCOUNTER — Ambulatory Visit (HOSPITAL_COMMUNITY)
Admission: RE | Admit: 2019-10-27 | Discharge: 2019-10-27 | Disposition: A | Discharge status: Home / Self Care | Payer: Medicare Other | Source: Ambulatory Visit | Attending: Interventional Radiology | Admitting: Interventional Radiology

## 2019-10-27 DIAGNOSIS — Z539 Procedure and treatment not carried out, unspecified reason: Secondary | ICD-10-CM | POA: Insufficient documentation

## 2019-10-27 DIAGNOSIS — I671 Cerebral aneurysm, nonruptured: Secondary | ICD-10-CM | POA: Diagnosis not present

## 2019-10-27 LAB — APTT: aPTT: 37 seconds — ABNORMAL HIGH (ref 24–36)

## 2019-10-27 LAB — PROTIME-INR
INR: 1.1 (ref 0.8–1.2)
Prothrombin Time: 13.6 seconds (ref 11.4–15.2)

## 2019-10-27 LAB — CBC
HCT: 42.1 % (ref 39.0–52.0)
Hemoglobin: 14.4 g/dL (ref 13.0–17.0)
MCH: 33 pg (ref 26.0–34.0)
MCHC: 34.2 g/dL (ref 30.0–36.0)
MCV: 96.6 fL (ref 80.0–100.0)
Platelets: 122 10*3/uL — ABNORMAL LOW (ref 150–400)
RBC: 4.36 MIL/uL (ref 4.22–5.81)
RDW: 12.7 % (ref 11.5–15.5)
WBC: 3.8 10*3/uL — ABNORMAL LOW (ref 4.0–10.5)
nRBC: 0 % (ref 0.0–0.2)

## 2019-10-27 MED ORDER — SODIUM CHLORIDE 0.9 % IV SOLN: INTRAVENOUS | Status: DC

## 2019-10-27 NOTE — Telephone Encounter (Signed)
Spoke with patients wife per release form and reviewed that he should hold xarelto until then. She verbalized understanding of instructions with no further questions at this time.

## 2019-10-27 NOTE — Progress Notes (Signed)
NIR.  Patient was scheduled for an image-guided diagnostic cerebral arteriogram tentatively for today in IR.  Unfortunately, there was an emergent code stroke procedure- because of this, patient's procedure will not occur today and will be rescheduled. Our schedulers to call patient to set up procedure- patient requests pre-procedure information in writing, schedulers aware. Informed patient on above. All questions answered and concerns addressed. Patient conveys understanding and agrees with plan.  Please call NIR with questions/concerns.   Adam Graff Deanie Jupiter, PA-C 10/27/2019, 9:41 AM

## 2019-10-27 NOTE — Telephone Encounter (Signed)
Please call regarding Xarelto. States patient is having a procedure on Thursday. Patient was scheduled for surgery this morning, and unexpectedly was cancelled due to an emergency. Please advise on it patient should take Xarelto.

## 2019-10-27 NOTE — Telephone Encounter (Signed)
Pt cleared to hold Xarelto for 2 days prior to procedure. If procedure has been moved to this Thursday, he should continue to hold Xarelto this evening and tomorrow evening.

## 2019-10-27 NOTE — Telephone Encounter (Signed)
Patients procedure has to be rescheduled. Could you review if he should hold or restart?

## 2019-10-28 ENCOUNTER — Encounter (HOSPITAL_COMMUNITY): Payer: Self-pay

## 2019-10-28 ENCOUNTER — Other Ambulatory Visit: Payer: Self-pay | Admitting: Physician Assistant

## 2019-10-28 ENCOUNTER — Other Ambulatory Visit: Payer: Self-pay | Admitting: Radiology

## 2019-10-29 ENCOUNTER — Other Ambulatory Visit: Payer: Self-pay

## 2019-10-29 ENCOUNTER — Other Ambulatory Visit (HOSPITAL_COMMUNITY): Payer: Self-pay | Admitting: Interventional Radiology

## 2019-10-29 ENCOUNTER — Encounter (HOSPITAL_COMMUNITY): Payer: Self-pay

## 2019-10-29 ENCOUNTER — Ambulatory Visit (HOSPITAL_COMMUNITY)
Admission: RE | Admit: 2019-10-29 | Discharge: 2019-10-29 | Disposition: A | Discharge status: Home / Self Care | Payer: Medicare Other | Source: Ambulatory Visit | Attending: Interventional Radiology | Admitting: Interventional Radiology

## 2019-10-29 DIAGNOSIS — K219 Gastro-esophageal reflux disease without esophagitis: Secondary | ICD-10-CM | POA: Insufficient documentation

## 2019-10-29 DIAGNOSIS — R413 Other amnesia: Secondary | ICD-10-CM | POA: Diagnosis not present

## 2019-10-29 DIAGNOSIS — I671 Cerebral aneurysm, nonruptured: Secondary | ICD-10-CM

## 2019-10-29 DIAGNOSIS — Z87891 Personal history of nicotine dependence: Secondary | ICD-10-CM | POA: Diagnosis not present

## 2019-10-29 DIAGNOSIS — I4891 Unspecified atrial fibrillation: Secondary | ICD-10-CM | POA: Insufficient documentation

## 2019-10-29 DIAGNOSIS — Z7901 Long term (current) use of anticoagulants: Secondary | ICD-10-CM | POA: Insufficient documentation

## 2019-10-29 DIAGNOSIS — Z79899 Other long term (current) drug therapy: Secondary | ICD-10-CM | POA: Insufficient documentation

## 2019-10-29 DIAGNOSIS — I471 Supraventricular tachycardia: Secondary | ICD-10-CM | POA: Diagnosis not present

## 2019-10-29 LAB — BASIC METABOLIC PANEL
Anion gap: 9 (ref 5–15)
BUN: 17 mg/dL (ref 8–23)
CO2: 24 mmol/L (ref 22–32)
Calcium: 9.3 mg/dL (ref 8.9–10.3)
Chloride: 107 mmol/L (ref 98–111)
Creatinine, Ser: 0.82 mg/dL (ref 0.61–1.24)
GFR calc Af Amer: >60 mL/min (ref >60)
GFR calc non Af Amer: >60 mL/min (ref >60)
Glucose, Bld: 97 mg/dL (ref 70–99)
Potassium: 3.9 mmol/L (ref 3.5–5.1)
Sodium: 140 mmol/L (ref 135–145)

## 2019-10-29 LAB — PROTIME-INR
INR: 1.1 (ref 0.8–1.2)
Prothrombin Time: 14.1 seconds (ref 11.4–15.2)

## 2019-10-29 LAB — CBC
HCT: 43.4 % (ref 39.0–52.0)
Hemoglobin: 15.3 g/dL (ref 13.0–17.0)
MCH: 34.2 pg — ABNORMAL HIGH (ref 26.0–34.0)
MCHC: 35.3 g/dL (ref 30.0–36.0)
MCV: 96.9 fL (ref 80.0–100.0)
Platelets: 271 10*3/uL (ref 150–400)
RBC: 4.48 MIL/uL (ref 4.22–5.81)
RDW: 13 % (ref 11.5–15.5)
WBC: 4.9 10*3/uL (ref 4.0–10.5)
nRBC: 0 % (ref 0.0–0.2)

## 2019-10-29 MED ORDER — NITROGLYCERIN 1 MG/10 ML FOR IR/CATH LAB
INTRA_ARTERIAL | Status: AC | PRN
  Administered 2019-10-29: 200 ug via INTRA_ARTERIAL

## 2019-10-29 MED ORDER — VERAPAMIL HCL 2.5 MG/ML IV SOLN
INTRAVENOUS | Status: AC
  Filled 2019-10-29: qty 2

## 2019-10-29 MED ORDER — NITROGLYCERIN 1 MG/10 ML FOR IR/CATH LAB
INTRA_ARTERIAL | Status: AC
  Filled 2019-10-29: qty 10

## 2019-10-29 MED ORDER — HEPARIN SODIUM (PORCINE) 1000 UNIT/ML IJ SOLN
INTRAMUSCULAR | Status: AC | PRN
  Administered 2019-10-29: 2000 [IU] via INTRA_ARTERIAL

## 2019-10-29 MED ORDER — LIDOCAINE HCL 1 % IJ SOLN
INTRAMUSCULAR | Status: AC | PRN
  Administered 2019-10-29: 6 mL

## 2019-10-29 MED ORDER — MIDAZOLAM HCL 2 MG/2ML IJ SOLN
INTRAMUSCULAR | Status: AC | PRN
  Administered 2019-10-29: 1 mg via INTRAVENOUS

## 2019-10-29 MED ORDER — MIDAZOLAM HCL 2 MG/2ML IJ SOLN
INTRAMUSCULAR | Status: AC
  Filled 2019-10-29: qty 2

## 2019-10-29 MED ORDER — SODIUM CHLORIDE 0.9 % IV SOLN: INTRAVENOUS | Status: DC

## 2019-10-29 MED ORDER — IOHEXOL 300 MG/ML  SOLN
150.0000 mL | Freq: Once | INTRAMUSCULAR | Status: AC | PRN
  Administered 2019-10-29: 65 mL via INTRA_ARTERIAL

## 2019-10-29 MED ORDER — LIDOCAINE HCL 1 % IJ SOLN
INTRAMUSCULAR | Status: AC
  Filled 2019-10-29: qty 20

## 2019-10-29 MED ORDER — SODIUM CHLORIDE 0.9 % IV SOLN: INTRAVENOUS | Status: AC

## 2019-10-29 MED ORDER — HEPARIN SODIUM (PORCINE) 1000 UNIT/ML IJ SOLN
INTRAMUSCULAR | Status: AC
  Filled 2019-10-29: qty 1

## 2019-10-29 MED ORDER — VERAPAMIL HCL 2.5 MG/ML IV SOLN: INTRA_ARTERIAL | Status: AC | PRN

## 2019-10-29 MED ORDER — FENTANYL CITRATE (PF) 100 MCG/2ML IJ SOLN
INTRAMUSCULAR | Status: AC
  Filled 2019-10-29: qty 2

## 2019-10-29 MED ORDER — FENTANYL CITRATE (PF) 100 MCG/2ML IJ SOLN
INTRAMUSCULAR | Status: AC | PRN
  Administered 2019-10-29: 25 ug via INTRAVENOUS

## 2019-10-29 NOTE — Procedures (Signed)
S/P bilateral common carotid anr RT Vert artery angiograms . RT rad approach. Findings . 1. 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 S.Anias Bartol MD

## 2019-10-29 NOTE — Progress Notes (Signed)
Discharge instructions reviewed with pt and his wife Rod Holler (via telephone) both voice understanding.

## 2019-10-29 NOTE — Sedation Documentation (Signed)
Patient transported to short stay via stretcher. Right radial puncture site assessed with Lisabeth Pick RN. Mild bruising noted from site. On xarelto. Pulse palpated with intact sensation and cap refill less than 3.

## 2019-10-29 NOTE — Progress Notes (Signed)
Ambulated to bathroom to void tol well  

## 2019-10-29 NOTE — Discharge Instructions (Signed)
Radial Site Care  This sheet gives you information about how to care for yourself after your procedure. Your health care provider may also give you more specific instructions. If you have problems or questions, contact your health care provider. What can I expect after the procedure? After the procedure, it is common to have:  Bruising and tenderness at the catheter insertion area. Follow these instructions at home: Medicines  Take over-the-counter and prescription medicines only as told by your health care provider. Insertion site care  Follow instructions from your health care provider about how to take care of your insertion site. Make sure you: ? Wash your hands with soap and water before you change your bandage (dressing). If soap and water are not available, use hand sanitizer. ? Change your dressing as told by your health care provider. ? Leave stitches (sutures), skin glue, or adhesive strips in place. These skin closures may need to stay in place for 2 weeks or longer. If adhesive strip edges start to loosen and curl up, you may trim the loose edges. Do not remove adhesive strips completely unless your health care provider tells you to do that.  Check your insertion site every day for signs of infection. Check for: ? Redness, swelling, or pain. ? Fluid or blood. ? Pus or a bad smell. ? Warmth.  Do not take baths, swim, or use a hot tub until your health care provider approves.  You may shower 24-48 hours after the procedure, or as directed by your health care provider. ? Remove the dressing and gently wash the site with plain soap and water. ? Pat the area dry with a clean towel. ? Do not rub the site. That could cause bleeding.  Do not apply powder or lotion to the site. Activity   For 24 hours after the procedure, or as directed by your health care provider: ? Do not flex or bend the affected arm. ? Do not push or pull heavy objects with the affected arm. ? Do not  drive yourself home from the hospital or clinic. You may drive 24 hours after the procedure unless your health care provider tells you not to. ? Do not operate machinery or power tools.  Do not lift anything that is heavier than 10 lb (4.5 kg), or the limit that you are told, until your health care provider says that it is safe.  Ask your health care provider when it is okay to: ? Return to work or school. ? Resume usual physical activities or sports. ? Resume sexual activity. General instructions  If the catheter site starts to bleed, raise your arm and put firm pressure on the site. If the bleeding does not stop, get help right away. This is a medical emergency.  If you went home on the same day as your procedure, a responsible adult should be with you for the first 24 hours after you arrive home.  Keep all follow-up visits as told by your health care provider. This is important. Contact a health care provider if:  You have a fever.  You have redness, swelling, or yellow drainage around your insertion site. Get help right away if:  You have unusual pain at the radial site.  The catheter insertion area swells very fast.  The insertion area is bleeding, and the bleeding does not stop when you hold steady pressure on the area.  Your arm or hand becomes pale, cool, tingly, or numb. These symptoms may represent a serious problem   that is an emergency. Do not wait to see if the symptoms will go away. Get medical help right away. Call your local emergency services (911 in the [place] Do not drive yourself to the hospital. Summary  After the procedure, it is common to have bruising and tenderness at the site.  Follow instructions from your health care provider about how to take care of your radial site wound. Check the wound every day for signs of infection.  Do not lift anything that is heavier than 10 lb (4.5 kg), or the limit that you are told, until your health care provider says  that it is safe. This information is not intended to replace advice given to you by your health care provider. Make sure you discuss any questions you have with your health care provider. Document Revised: 06/05/2017 Document Reviewed: 06/05/2017 Elsevier Patient Education  2020 [employer]  

## 2019-10-29 NOTE — H&P (Signed)
Chief Complaint: Patient was seen in consultation today for left cavernous carotid aneurysm  Supervising Physician: Luanne Bras  Patient Status: [facility] - Out-pt  History of Present Illness: Adam Juarez is a 79 y.o. 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 The patient met with Dr. Estanislado Pandy in consultation 10/20/19 to discuss this finding as well as further evaluation and management. He elected to proceed with diagnostic angiogram.  He was originally scheduled for Tuesday of this week, however due to emergent patient case, he was rescheduled for today.  He has continued to hold his Xarelto in the interim.   Patient assessed at bedside this AM. Denies new complaints or concerns. He is agreeable to proceed with angiogram today. He has been NPO. He has held his Xarelto appropriately.   Past Medical History:  Diagnosis Date  . Arthritis    fingers  . 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 Chicken pox   . Dysrhythmia 09/2013   Hx. a-fib x 1 episode patient states he "auto corrected" seen at [facility]  . GERD (gastroesophageal reflux disease)   . Headache    poor posture - none recently  . PSVT (paroxysmal supraventricular tachycardia) ([place]) 2009   Controlled with "breathing process"  . Renal stone 9/08, 6/15  . Wears dentures    full upper    Past Surgical History:  Procedure Laterality Date  . CARDIAC CATHETERIZATION  [address] [place]  . CATARACT EXTRACTION W/PHACO Left 07/27/2015   Procedure: CATARACT EXTRACTION PHACO AND INTRAOCULAR LENS PLACEMENT (IOC);  Surgeon: Leandrew Koyanagi, MD;  Location: [facility];  Service: Ophthalmology;  Laterality: Left;  TORIC  . CATARACT EXTRACTION W/PHACO Right 08/24/2015   Procedure: CATARACT EXTRACTION PHACO AND INTRAOCULAR  LENS PLACEMENT (IOC);  Surgeon: Leandrew Koyanagi, MD;  Location: [facility];  Service: Ophthalmology;  Laterality: Right;  TORIC  . HEMORROIDECTOMY  2010   banding  . KIDNEY STONE SURGERY  9/08, 6/15   lithotripsy  . PROSTATE BIOPSY  2007  . SKIN CANCER EXCISION    . TOENAIL TRIMMING  11/2017   Dr. Elvina Mattes  . VASECTOMY      Allergies: Other  Medications: Prior to Admission medications   Medication Sig Start Date End Date Taking? Authorizing Provider  Ascorbic Acid (VITAMIN C) 1000 MG tablet Take 1,000 mg by mouth daily.   Yes [provider]  Magnesium Cl-Calcium Carbonate (SLOW-MAG PO) Take 1 tablet by mouth daily.    Yes [provider]  metoprolol succinate (TOPROL-XL) 25 MG 24 hr tablet Take 1 tablet (25 mg total) by mouth in the morning and at bedtime. 08/31/19  Yes Gollan, Kathlene November, MD  Multiple Vitamins-Minerals (MULTIVITAMIN WITH MINERALS) tablet Take 1 tablet by mouth daily.   Yes [provider]  Multiple Vitamins-Minerals (PRESERVISION AREDS 2 PO) Take 1 capsule by mouth 2 (two) times daily.    Yes [provider]  Polyethyl Glycol-Propyl Glycol (SYSTANE OP) Apply 1 drop to eye 3 (three) times daily as needed (dry eyes).    Yes [provider]  XARELTO 20 MG TABS tablet TAKE 1 TABLET DAILY WITH SUPPER Patient taking differently: Take 20 mg by mouth daily with supper.  05/21/19  Yes Gollan, Kathlene November, MD  EPINEPHrine (EPIPEN 2-PAK)  0.3 mg/0.3 mL IJ SOAJ injection Inject 0.3 mLs (0.3 mg total) into the muscle as needed for anaphylaxis. 10/21/19   Pleas Koch, NP  omeprazole (PRILOSEC) 20 MG capsule TAKE 1 CAPSULE DAILY Patient taking differently: Take 20 mg by mouth daily.  08/14/19   Pleas Koch, NP  Potassium 99 MG TABS Take 99 mg by mouth daily.     [provider]  Probiotic Product (PROBIOTIC DAILY PO) Take 1 capsule by mouth daily.     [provider]     Family History  Problem  Relation Age of Onset  . AAA (abdominal aortic aneurysm) Father   . Parkinson's disease Mother   . Arthritis Mother     Social History   Socioeconomic History  . Marital status: Married    Spouse name: Not on file  . Number of children: Not on file  . Years of education: Not on file  . Highest education level: Not on file  Occupational History  . Not on file  Tobacco Use  . Smoking status: Former Smoker    Packs/day: 0.50    Years: 38.00    Pack years: 19.00    Types: Cigarettes    Quit date: 07/13/1997    Years since quitting: 22.3  . Smokeless tobacco: Never Used  Vaping Use  . Vaping Use: Never used  Substance and Sexual Activity  . Alcohol use: No  . Drug use: No  . Sexual activity: Not on file  Other Topics Concern  . Not on file  Social History Narrative  . Not on file   Social Determinants of Health   Financial Resource Strain:   . Difficulty of Paying Living Expenses:   Food Insecurity:   . Worried About Charity fundraiser in the Last Year:   . Arboriculturist in the Last Year:   Transportation Needs:   . Film/video editor (Medical):   Marland Kitchen Lack of Transportation (Non-Medical):   Physical Activity:   . Days of Exercise per Week:   . Minutes of Exercise per Session:   Stress:   . Feeling of Stress :   Social Connections:   . Frequency of Communication with Friends and Family:   . Frequency of Social Gatherings with Friends and Family:   . Attends Religious Services:   . Active Member of Clubs or Organizations:   . Attends Archivist Meetings:   Marland Kitchen Marital Status:      Review of Systems: A 12 point ROS discussed and pertinent positives are indicated in the HPI above.  All other systems are negative.  Review of Systems  Constitutional: Negative for fatigue and fever.  Respiratory: Negative for cough and shortness of breath.   Cardiovascular: Negative for chest pain.  Gastrointestinal: Negative for abdominal pain, diarrhea, nausea and  vomiting.  Genitourinary: Negative for dysuria.  Musculoskeletal: Negative for back pain.  Neurological: Negative for dizziness and headaches.  Psychiatric/Behavioral: Negative for behavioral problems and confusion.    Vital Signs: BP (!) 150/89   Pulse 66   Temp 98 F (36.7 C) (Skin)   Resp 16   Ht 5' 10"  (1.778 m)   Wt 203 lb (92.1 kg)   SpO2 94%   BMI 29.13 kg/m   Physical Exam Vitals and nursing note reviewed.  Constitutional:      General: He is not in acute distress.    Appearance: Normal appearance. He is not ill-appearing.  HENT:  Mouth/Throat:     Mouth: Mucous membranes are moist.     Pharynx: Oropharynx is clear.  Cardiovascular:     Rate and Rhythm: Normal rate and regular rhythm.  Pulmonary:     Effort: Pulmonary effort is normal. No respiratory distress.     Breath sounds: Normal breath sounds.  Abdominal:     General: Abdomen is flat.     Palpations: Abdomen is soft.  Skin:    General: Skin is warm.  Neurological:     General: No focal deficit present.     Mental Status: He is alert and oriented to person, place, and time. Mental status is at baseline.  Psychiatric:        Mood and Affect: Mood normal.        Behavior: Behavior normal.        Thought Content: Thought content normal.        Judgment: Judgment normal.      MD Evaluation Airway: WNL Heart: WNL Abdomen: WNL Chest/ Lungs: WNL ASA  Classification: 3 Mallampati/Airway Score: Two   Imaging: No results found.  Labs:  CBC: Recent Labs    03/12/19 0806 07/15/19 1248 10/27/19 0930 10/29/19 1007  WBC 4.9 4.3 3.8* 4.9  HGB 14.8 14.5 14.4 15.3  HCT 43.2 41.4 42.1 43.4  PLT 144* 134.0* 122* 271    COAGS: Recent Labs    10/27/19 0930  INR 1.1  APTT 37*    BMP: Recent Labs    02/06/19 1553 03/12/19 0806 07/15/19 1248  NA 142 139 137  K 4.1 4.2 4.0  CL 107 103 106  CO2 22 23 25   GLUCOSE 99 118* 107*  BUN 21 21 21   CALCIUM 9.5 9.5 9.8  CREATININE 0.85  0.90 0.85  GFRNONAA  --  >60  --   GFRAA  --  >60  --     LIVER FUNCTION TESTS: Recent Labs    01/30/19 0734 02/06/19 1553 03/12/19 0806  BILITOT 1.0 0.9 1.2  AST 20 25 28   ALT 16 17 18   ALKPHOS 58  --  61  PROT 6.9 6.7 7.7  ALBUMIN 4.4  --  4.5    TUMOR MARKERS: No results for input(s): AFPTM, CEA, CA199, CHROMGRNA in the last 8760 hours.  Assessment and Plan: Patient with past medical history of memory loss presents with complaint of incidental finding of L cavernous carotid aneurysm.  IR consulted for diagnostic angiogram at the request of Dr. Jaynee Eagles. Case reviewed by Dr. Estanislado Pandy who approves patient for procedure and has met with patient in consultation. Patient presents today in their usual state of health.  He has been NPO and is not currently on blood thinners as he has appropriately held his Xarelto.    Risks and benefits were discussed with the patient including, but not limited to bleeding, infection, vascular injury or contrast induced renal failure.  This interventional procedure involves the use of X-rays and because of the nature of the planned procedure, it is possible that we will have prolonged use of X-ray fluoroscopy.  Potential radiation risks to you include (but are not limited to) the following: - A slightly elevated risk for cancer  several years later in life. This risk is typically less than 0.5% percent. This risk is low in comparison to the normal incidence of human cancer, which is 33% for women and 50% for men according to the [place]. - Radiation induced injury can include skin redness, resembling a rash, tissue  breakdown / ulcers and hair loss (which can be temporary or permanent).   The likelihood of either of these occurring depends on the difficulty of the procedure and whether you are sensitive to radiation due to previous procedures, disease, or genetic conditions.   IF your procedure requires a prolonged use of  radiation, you will be notified and given written instructions for further action.  It is your responsibility to monitor the irradiated area for the 2 weeks following the procedure and to notify your physician if you are concerned that you have suffered a radiation induced injury.    All of the patient's questions were answered, patient is agreeable to proceed.  Consent signed and in chart.  Thank you for this interesting consult.  I greatly enjoyed meeting Adam Juarez and look forward to participating in their care.  A copy of this report was sent to the requesting provider on this date.  Electronically Signed: Docia Barrier, PA 10/29/2019, 11:17 AM   I spent a total of   25 Minutes in face to face in clinical consultation, greater than 50% of which was counseling/coordinating care for L cavernous carotid aneurysm.

## 2019-10-30 ENCOUNTER — Other Ambulatory Visit (HOSPITAL_COMMUNITY): Payer: Self-pay | Admitting: Interventional Radiology

## 2019-10-30 DIAGNOSIS — I671 Cerebral aneurysm, nonruptured: Secondary | ICD-10-CM

## 2019-10-30 HISTORY — PX: IR ANGIO VERTEBRAL SEL VERTEBRAL UNI R MOD SED: IMG5368

## 2019-10-30 HISTORY — PX: IR ANGIO INTRA EXTRACRAN SEL COM CAROTID INNOMINATE BILAT MOD SED: IMG5360

## 2019-10-30 HISTORY — PX: IR US GUIDE VASC ACCESS RIGHT: IMG2390

## 2019-11-05 ENCOUNTER — Ambulatory Visit (HOSPITAL_COMMUNITY)
Admission: RE | Admit: 2019-11-05 | Discharge: 2019-11-05 | Disposition: A | Discharge status: Home / Self Care | Payer: Medicare Other | Source: Ambulatory Visit | Attending: Interventional Radiology | Admitting: Interventional Radiology

## 2019-11-05 DIAGNOSIS — I671 Cerebral aneurysm, nonruptured: Secondary | ICD-10-CM

## 2019-11-05 NOTE — Progress Notes (Signed)
Chief Complaint: Patient was seen in consultation today for brain aneurysm  Supervising Physician: Luanne Bras  Patient Status: [facility] - Out-pt  History of Present Illness: Adam Juarez is a 79 y.o. 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 After consultation with Dr. Estanislado Pandy he elected to proceed with diagnostic angiogram.  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 He presents in consultation today to discuss this findings.   Patient presents in his usual state of health today.  He complains of occasional left-sided headache positioned behind his left eye that lasts for seconds and resolves spontaneously. He denies blurred vision, double vision, prolonged headaches.  He is accompanied by his wife to assists with history.    Past Medical History:  Diagnosis Date  . Arthritis    fingers  . 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 Chicken pox   . Dysrhythmia 09/2013   Hx. a-fib x 1 episode patient states he "auto corrected" seen at [facility]  . GERD (gastroesophageal reflux disease)   . Headache    poor posture - none recently  . PSVT (paroxysmal supraventricular tachycardia) ([place]) 2009   Controlled with "breathing process"  . Renal stone 9/08, 6/15  . Wears dentures    full upper    Past Surgical History:  Procedure Laterality Date  . CARDIAC CATHETERIZATION  [address] [place]  . CATARACT EXTRACTION W/PHACO Left 07/27/2015   Procedure: CATARACT EXTRACTION PHACO AND INTRAOCULAR LENS PLACEMENT (IOC);  Surgeon: Leandrew Koyanagi, MD;  Location: [place];  Service: Ophthalmology;  Laterality: Left;  TORIC  . CATARACT EXTRACTION W/PHACO Right 08/24/2015   Procedure:  CATARACT EXTRACTION PHACO AND INTRAOCULAR LENS PLACEMENT (IOC);  Surgeon: Leandrew Koyanagi, MD;  Location: [place];  Service: Ophthalmology;  Laterality: Right;  TORIC  . HEMORROIDECTOMY  2010   banding  . IR ANGIO INTRA EXTRACRAN SEL COM CAROTID INNOMINATE BILAT MOD SED  10/30/2019  . IR ANGIO VERTEBRAL SEL VERTEBRAL UNI R MOD SED  10/30/2019  . IR US GUIDE VASC ACCESS RIGHT  10/30/2019  . KIDNEY STONE SURGERY  9/08, 6/15   lithotripsy  . PROSTATE BIOPSY  2007  . SKIN CANCER EXCISION    . TOENAIL TRIMMING  11/2017   Dr. Elvina Mattes  . VASECTOMY      Allergies: Other  Medications: Prior to Admission medications   Medication Sig Start Date End Date Taking? Authorizing Provider  Ascorbic Acid (VITAMIN C) 1000 MG tablet Take 1,000 mg by mouth daily.    [provider]  EPINEPHrine (EPIPEN 2-PAK) 0.3 mg/0.3 mL IJ SOAJ injection Inject 0.3 mLs (0.3 mg total) into the muscle as needed for anaphylaxis. 10/21/19   Pleas Koch, NP  Magnesium Cl-Calcium Carbonate (SLOW-MAG PO) Take 1 tablet by mouth daily.     [provider]  metoprolol succinate (TOPROL-XL) 25 MG 24 hr tablet Take 1 tablet (25 mg total) by mouth in the morning and at bedtime. 08/31/19   Minna Merritts, MD  Multiple Vitamins-Minerals (MULTIVITAMIN WITH MINERALS) tablet Take 1 tablet by mouth daily.    [provider]  Multiple Vitamins-Minerals (PRESERVISION AREDS 2 PO) Take 1 capsule by mouth 2 (two) times  daily.     [provider]  omeprazole (PRILOSEC) 20 MG capsule TAKE 1 CAPSULE DAILY Patient taking differently: Take 20 mg by mouth daily.  08/14/19   Pleas Koch, NP  Polyethyl Glycol-Propyl Glycol (SYSTANE OP) Apply 1 drop to eye 3 (three) times daily as needed (dry eyes).     [provider]  Potassium 99 MG TABS Take 99 mg by mouth daily.     [provider]  Probiotic Product (PROBIOTIC DAILY PO) Take 1 capsule by mouth daily.     [provider]  XARELTO 20 MG TABS tablet TAKE 1 TABLET DAILY WITH SUPPER Patient taking differently: Take 20 mg by mouth daily with supper.  05/21/19   Minna Merritts, MD     Family History  Problem Relation Age of Onset  . AAA (abdominal aortic aneurysm) Father   . Parkinson's disease Mother   . Arthritis Mother     Social History   Socioeconomic History  . Marital status: Married    Spouse name: Not on file  . Number of children: Not on file  . Years of education: Not on file  . Highest education level: Not on file  Occupational History  . Not on file  Tobacco Use  . Smoking status: Former Smoker    Packs/day: 0.50    Years: 38.00    Pack years: 19.00    Types: Cigarettes    Quit date: 07/13/1997    Years since quitting: 22.3  . Smokeless tobacco: Never Used  Vaping Use  . Vaping Use: Never used  Substance and Sexual Activity  . Alcohol use: No  . Drug use: No  . Sexual activity: Not on file  Other Topics Concern  . Not on file  Social History Narrative  . Not on file   Social Determinants of Health   Financial Resource Strain:   . Difficulty of Paying Living Expenses:   Food Insecurity:   . Worried About Charity fundraiser in the Last Year:   . Arboriculturist in the Last Year:   Transportation Needs:   . Film/video editor (Medical):   Marland Kitchen Lack of Transportation (Non-Medical):   Physical Activity:   . Days of Exercise per Week:   . Minutes of Exercise per Session:   Stress:   . Feeling of Stress :   Social Connections:   . Frequency of Communication with Friends and Family:   . Frequency of Social Gatherings with Friends and Family:   . Attends Religious Services:   . Active Member of Clubs or Organizations:   . Attends Archivist Meetings:   Marland Kitchen Marital Status:      Review of Systems: A 12 point ROS discussed and pertinent positives are indicated in the HPI above.  All other systems are negative.  Review of Systems  Constitutional:  Negative for fatigue and fever.  Respiratory: Negative for cough and shortness of breath.   Cardiovascular: Negative for chest pain.  Gastrointestinal: Negative for abdominal pain, nausea and vomiting.  Genitourinary: Negative for dysuria.  Musculoskeletal: Negative for back pain.  Neurological: Positive for headaches. Negative for dizziness, facial asymmetry, weakness, light-headedness and numbness.  Psychiatric/Behavioral: Negative for behavioral problems and confusion.    Vital Signs: There were no vitals taken for this visit.  Physical Exam NAD, alert Neuro: alert and oriented. Face symmetric.  EOMs intact. Speech intelligible. Moves all extremities. Gait abnormality noted- heavy steps.    Imaging:  IR US Guide Vasc Access Right  Result Date: 10/30/2019 CLINICAL DATA:  Recent history of headaches and memory loss. Workup revealed the presence of a large left ICA cavernous region aneurysm. EXAM: IR ANGIO VERTEBRAL SEL VERTEBRAL UNI RIGHT MOD SED COMPARISON:  MRI of the brain of Sep 24, 2019. MEDICATIONS: Heparin 2000 units IA. No antibiotic was administered within 1 hour of the procedure. ANESTHESIA/SEDATION: Versed 1 mg IV; Fentanyl 25 mcg IV Moderate Sedation Time:  37 minutes The patient was continuously monitored during the procedure by the interventional radiology nurse under my direct supervision. CONTRAST:  Isovue 300 approximately 60 mL FLUOROSCOPY TIME:  Fluoroscopy Time: 11 minutes 12 seconds (880 mGy). COMPLICATIONS: None immediate. TECHNIQUE: Informed written consent was obtained from the patient after a thorough discussion of the procedural risks, benefits and alternatives. All questions were addressed. Maximal Sterile Barrier Technique was utilized including caps, mask, sterile gowns, sterile gloves, sterile drape, hand hygiene and skin antiseptic. A timeout was performed prior to the initiation of the procedure. The right forearm to the wrist was prepped and draped in the usual  sterile manner. The right radial artery was identified and its morphology documented with ultrasound. Dorsal palmar anastomosis was verified to be present. Using ultrasound guidance, access into the right radial artery was then obtained over a 018 inch micro guidewire. A 4/5 French radial sheath was then inserted without event. The micro guidewire and the obturator were then removed. Good aspiration was obtained from the side port of the sheath. A cocktail of 2000 units of heparin, 2.5 mg of verapamil, and 200 mcg of nitroglycerin was then infused through the sheath in diluted form without event. A right radial arteriogram was then obtained. Over a 0.035 inch Roadrunner guidewire, a 5 [place] Simmons 2 diagnostic catheter was then advanced to the aortic arch region, and select cannulations were performed of the right vertebral artery, the right common carotid artery and the left common carotid artery. Following the procedure, the radial access site was sealed with a wrist band. Distal radial pulse was verified to be present. FINDINGS: The right radial artery origin is widely patent. The vessel is seen to opacify to the cranial skull base. Patency is maintained of the right vertebrobasilar junction and the right posterior-inferior cerebellar artery. The basilar artery, the posterior cerebral arteries, the superior cerebellar arteries and the anterior-inferior cerebellar arteries opacify into the capillary and venous phases. Unopacified blood is partially seen from the contralateral vertebral artery. The right common carotid arteriogram demonstrates the right external carotid artery and its major branches to be widely patent. The right internal carotid artery at the bulb to the cranial skull base is also widely patent. The petrous segment is patent. There is mild diffuse fusiform dilatation of the caval cavernous, and the distal cavernous segment of the right internal carotid artery. The supraclinoid right ICA is  widely patent. The right middle cerebral artery opacifies into the capillary and venous phases. Hypoplastic right anterior cerebral A1 segment is seen with distal opacification of the right ACA A2 segment with mixing with unopacified blood from the contralateral left anterior cerebral artery. The left common carotid arteriogram demonstrates the left external carotid artery and its major branches to be widely patent. The left internal carotid artery at the bulb to the cranial skull base is also widely patent. Patency is maintained of the petrous segment. There is a large eccentrically medially prominent fusiform aneurysm of the proximal cavernous segment of the left internal carotid artery. This measures approximately  12 mm x 11.5 mm associated with dysplasia of the vessel proximal to the aneurysm, and also distally to just proximal to the origin of the ophthalmic artery. The supraclinoid left ICA appears widely patent. The left middle cerebral artery and the left anterior cerebral artery opacify into the capillary and venous phases. Prompt cross filling via the anterior communicating artery of the right anterior cerebral A2 segment and distally is seen. 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 Hypoplastic anterior cerebral artery A1 segment. PLAN: Findings were reviewed with the patient and the patient's spouse. Follow-up consultation in the next 1-2 weeks to discuss management of the above findings. Electronically Signed   By: Luanne Bras M.D.   On: 10/29/2019 14:20   IR ANGIO INTRA EXTRACRAN SEL COM CAROTID INNOMINATE BILAT MOD SED  Result Date: 10/30/2019 CLINICAL DATA:  Recent history of headaches and memory loss. Workup revealed the presence of a large left ICA cavernous region aneurysm.  EXAM: IR ANGIO VERTEBRAL SEL VERTEBRAL UNI RIGHT MOD SED COMPARISON:  MRI of the brain of Sep 24, 2019. MEDICATIONS: Heparin 2000 units IA. No antibiotic was administered within 1 hour of the procedure. ANESTHESIA/SEDATION: Versed 1 mg IV; Fentanyl 25 mcg IV Moderate Sedation Time:  37 minutes The patient was continuously monitored during the procedure by the interventional radiology nurse under my direct supervision. CONTRAST:  Isovue 300 approximately 60 mL FLUOROSCOPY TIME:  Fluoroscopy Time: 11 minutes 12 seconds (880 mGy). COMPLICATIONS: None immediate. TECHNIQUE: Informed written consent was obtained from the patient after a thorough discussion of the procedural risks, benefits and alternatives. All questions were addressed. Maximal Sterile Barrier Technique was utilized including caps, mask, sterile gowns, sterile gloves, sterile drape, hand hygiene and skin antiseptic. A timeout was performed prior to the initiation of the procedure. The right forearm to the wrist was prepped and draped in the usual sterile manner. The right radial artery was identified and its morphology documented with ultrasound. Dorsal palmar anastomosis was verified to be present. Using ultrasound guidance, access into the right radial artery was then obtained over a 018 inch micro guidewire. A 4/5 French radial sheath was then inserted without event. The micro guidewire and the obturator were then removed. Good aspiration was obtained from the side port of the sheath. A cocktail of 2000 units of heparin, 2.5 mg of verapamil, and 200 mcg of nitroglycerin was then infused through the sheath in diluted form without event. A right radial arteriogram was then obtained. Over a 0.035 inch Roadrunner guidewire, a 5 [place] Simmons 2 diagnostic catheter was then advanced to the aortic arch region, and select cannulations were performed of the right vertebral artery, the right common carotid artery and the left common carotid artery. Following  the procedure, the radial access site was sealed with a wrist band. Distal radial pulse was verified to be present. FINDINGS: The right radial artery origin is widely patent. The vessel is seen to opacify to the cranial skull base. Patency is maintained of the right vertebrobasilar junction and the right posterior-inferior cerebellar artery. The basilar artery, the posterior cerebral arteries, the superior cerebellar arteries and the anterior-inferior cerebellar arteries opacify into the capillary and venous phases. Unopacified blood is partially seen from the contralateral vertebral artery. The right common carotid arteriogram demonstrates the right external carotid artery and its major branches to  be widely patent. The right internal carotid artery at the bulb to the cranial skull base is also widely patent. The petrous segment is patent. There is mild diffuse fusiform dilatation of the caval cavernous, and the distal cavernous segment of the right internal carotid artery. The supraclinoid right ICA is widely patent. The right middle cerebral artery opacifies into the capillary and venous phases. Hypoplastic right anterior cerebral A1 segment is seen with distal opacification of the right ACA A2 segment with mixing with unopacified blood from the contralateral left anterior cerebral artery. The left common carotid arteriogram demonstrates the left external carotid artery and its major branches to be widely patent. The left internal carotid artery at the bulb to the cranial skull base is also widely patent. Patency is maintained of the petrous segment. There is a large eccentrically medially prominent fusiform aneurysm of the proximal cavernous segment of the left internal carotid artery. This measures approximately 12 mm x 11.5 mm associated with dysplasia of the vessel proximal to the aneurysm, and also distally to just proximal to the origin of the ophthalmic artery. The supraclinoid left ICA appears widely  patent. The left middle cerebral artery and the left anterior cerebral artery opacify into the capillary and venous phases. Prompt cross filling via the anterior communicating artery of the right anterior cerebral A2 segment and distally is seen. 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 Hypoplastic anterior cerebral artery A1 segment. PLAN: Findings were reviewed with the patient and the patient's spouse. Follow-up consultation in the next 1-2 weeks to discuss management of the above findings. Electronically Signed   By: Luanne Bras M.D.   On: 10/29/2019 14:20   IR ANGIO VERTEBRAL SEL VERTEBRAL UNI R MOD SED  Result Date: 10/30/2019 CLINICAL DATA:  Recent history of headaches and memory loss. Workup revealed the presence of a large left ICA cavernous region aneurysm. EXAM: IR ANGIO VERTEBRAL SEL VERTEBRAL UNI RIGHT MOD SED COMPARISON:  MRI of the brain of Sep 24, 2019. MEDICATIONS: Heparin 2000 units IA. No antibiotic was administered within 1 hour of the procedure. ANESTHESIA/SEDATION: Versed 1 mg IV; Fentanyl 25 mcg IV Moderate Sedation Time:  37 minutes The patient was continuously monitored during the procedure by the interventional radiology nurse under my direct supervision. CONTRAST:  Isovue 300 approximately 60 mL FLUOROSCOPY TIME:  Fluoroscopy Time: 11 minutes 12 seconds (880 mGy). COMPLICATIONS: None immediate. TECHNIQUE: Informed written consent was obtained from the patient after a thorough discussion of the procedural risks, benefits and alternatives. All questions were addressed. Maximal Sterile Barrier Technique was utilized including caps, mask, sterile gowns, sterile gloves, sterile drape, hand hygiene and skin antiseptic. A timeout was performed prior to the initiation of  the procedure. The right forearm to the wrist was prepped and draped in the usual sterile manner. The right radial artery was identified and its morphology documented with ultrasound. Dorsal palmar anastomosis was verified to be present. Using ultrasound guidance, access into the right radial artery was then obtained over a 018 inch micro guidewire. A 4/5 French radial sheath was then inserted without event. The micro guidewire and the obturator were then removed. Good aspiration was obtained from the side port of the sheath. A cocktail of 2000 units of heparin, 2.5 mg of verapamil, and 200 mcg of nitroglycerin was then infused through the sheath  in diluted form without event. A right radial arteriogram was then obtained. Over a 0.035 inch Roadrunner guidewire, a 5 [place] Simmons 2 diagnostic catheter was then advanced to the aortic arch region, and select cannulations were performed of the right vertebral artery, the right common carotid artery and the left common carotid artery. Following the procedure, the radial access site was sealed with a wrist band. Distal radial pulse was verified to be present. FINDINGS: The right radial artery origin is widely patent. The vessel is seen to opacify to the cranial skull base. Patency is maintained of the right vertebrobasilar junction and the right posterior-inferior cerebellar artery. The basilar artery, the posterior cerebral arteries, the superior cerebellar arteries and the anterior-inferior cerebellar arteries opacify into the capillary and venous phases. Unopacified blood is partially seen from the contralateral vertebral artery. The right common carotid arteriogram demonstrates the right external carotid artery and its major branches to be widely patent. The right internal carotid artery at the bulb to the cranial skull base is also widely patent. The petrous segment is patent. There is mild diffuse fusiform dilatation of the caval cavernous, and the distal cavernous  segment of the right internal carotid artery. The supraclinoid right ICA is widely patent. The right middle cerebral artery opacifies into the capillary and venous phases. Hypoplastic right anterior cerebral A1 segment is seen with distal opacification of the right ACA A2 segment with mixing with unopacified blood from the contralateral left anterior cerebral artery. The left common carotid arteriogram demonstrates the left external carotid artery and its major branches to be widely patent. The left internal carotid artery at the bulb to the cranial skull base is also widely patent. Patency is maintained of the petrous segment. There is a large eccentrically medially prominent fusiform aneurysm of the proximal cavernous segment of the left internal carotid artery. This measures approximately 12 mm x 11.5 mm associated with dysplasia of the vessel proximal to the aneurysm, and also distally to just proximal to the origin of the ophthalmic artery. The supraclinoid left ICA appears widely patent. The left middle cerebral artery and the left anterior cerebral artery opacify into the capillary and venous phases. Prompt cross filling via the anterior communicating artery of the right anterior cerebral A2 segment and distally is seen. 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 Hypoplastic anterior cerebral artery A1 segment. PLAN: Findings were reviewed with the patient and the patient's spouse. Follow-up consultation in the next 1-2 weeks to discuss management of the above findings. Electronically Signed   By: Luanne Bras M.D.   On: 10/29/2019 14:20    Labs:  CBC: Recent Labs    03/12/19 0806 07/15/19 1248 10/27/19 0930 10/29/19 1007  WBC 4.9 4.3 3.8* 4.9  HGB 14.8 14.5 14.4 15.3  HCT 43.2  41.4 42.1 43.4  PLT 144* 134.0* 122* 271    COAGS: Recent Labs    10/27/19 0930 10/29/19 1125  INR 1.1 1.1  APTT 37*  --     BMP: Recent Labs    02/06/19 1553 03/12/19 0806 07/15/19 1248 10/29/19 1125  NA 142 139 137 140  K 4.1 4.2 4.0 3.9  CL 107 103 106 107  CO2 22 23 25 24   GLUCOSE 99 118* 107* 97  BUN 21 21 21 17   CALCIUM 9.5 9.5 9.8 9.3  CREATININE  0.85 0.90 0.85 0.82  GFRNONAA  --  >60  --  >60  GFRAA  --  >60  --  >60    LIVER FUNCTION TESTS: Recent Labs    01/30/19 0734 02/06/19 1553 03/12/19 0806  BILITOT 1.0 0.9 1.2  AST 20 25 28   ALT 16 17 18   ALKPHOS 58  --  61  PROT 6.9 6.7 7.7  ALBUMIN 4.4  --  4.5    TUMOR MARKERS: No results for input(s): AFPTM, CEA, CA199, CHROMGRNA in the last 8760 hours.  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 Adam Juarez presents today accompanied by his wife to discuss the findings of his diagnostic angiogram.  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 He reports occasional left-sided headaches which are severe but short and may be related to his aneurysm, however he his otherwise asymptomatic.  He is also undergoing further work-up for his increased intra-cranial pressure.  He does have walking difficulties and an abnormal gait possibly related to this.  Discussed results of most recent imaging scan. Explained nature of aneurysms, including risk of rupture. Explained that there are two management options moving forward- either conservative management including routine image scans to monitor for changes, or with intervention.  In his case, based on the location of the aneurysm he would likely require telescoping pipeline diverters. Both options were discussed in detail, including risks and benefits. At this time, Dr. Estanislado Pandy recommends conservative management with MRA in 6 months. In the meantime, patient should continue work up for his increased  intra-cranial pressure as he is seemingly more symptomatic of this. Instructed patient to start 81mg  aspirin now.   Plan for MRA Head in 6 months unless otherwise becomes more symptomatic.   Thank you for this interesting consult.  I greatly enjoyed meeting Kailon Treese and look forward to participating in their care.  A copy of this report was sent to the requesting provider on this date.  Electronically Signed: Docia Barrier, PA 11/05/2019, 1:18 PM   I spent a total of    25 Minutes in face to face in clinical consultation, greater than 50% of which was counseling/coordinating care for intra-cranial aneurysm.

## 2019-11-13 DIAGNOSIS — Z961 Presence of intraocular lens: Secondary | ICD-10-CM | POA: Diagnosis not present

## 2019-11-13 DIAGNOSIS — H353132 Nonexudative age-related macular degeneration, bilateral, intermediate dry stage: Secondary | ICD-10-CM | POA: Diagnosis not present

## 2019-11-18 DIAGNOSIS — G912 (Idiopathic) normal pressure hydrocephalus: Secondary | ICD-10-CM | POA: Diagnosis not present

## 2019-11-19 DIAGNOSIS — G912 (Idiopathic) normal pressure hydrocephalus: Secondary | ICD-10-CM | POA: Diagnosis not present

## 2019-11-19 DIAGNOSIS — D696 Thrombocytopenia, unspecified: Secondary | ICD-10-CM

## 2019-11-24 ENCOUNTER — Other Ambulatory Visit: Payer: Self-pay | Admitting: Neurosurgery

## 2019-11-24 ENCOUNTER — Other Ambulatory Visit (HOSPITAL_COMMUNITY): Payer: Self-pay | Admitting: Neurosurgery

## 2019-11-24 DIAGNOSIS — G912 (Idiopathic) normal pressure hydrocephalus: Secondary | ICD-10-CM

## 2019-11-24 NOTE — Addendum Note (Signed)
Encounter addended by: Riley Churches on: 11/24/2019 9:51 AM  Actions taken: Charge Capture section accepted

## 2019-11-27 ENCOUNTER — Telehealth: Payer: Self-pay | Admitting: Adult Health

## 2019-11-27 NOTE — Telephone Encounter (Signed)
Pt returned call and stated that he has other health issues that has to be taken care of first.  Pt stated that he would call us back to schedule once these issues are resolved. Rhonda J Cobb

## 2019-11-29 ENCOUNTER — Other Ambulatory Visit: Payer: Self-pay | Admitting: Cardiovascular Disease

## 2019-12-01 NOTE — Addendum Note (Signed)
Encounter addended by: Riley Churches on: 12/01/2019 3:27 PM  Actions taken: Charge Capture section accepted

## 2019-12-02 ENCOUNTER — Other Ambulatory Visit: Payer: Self-pay

## 2019-12-02 ENCOUNTER — Other Ambulatory Visit (INDEPENDENT_AMBULATORY_CARE_PROVIDER_SITE_OTHER): Payer: Medicare Other

## 2019-12-02 DIAGNOSIS — D696 Thrombocytopenia, unspecified: Secondary | ICD-10-CM | POA: Diagnosis not present

## 2019-12-03 ENCOUNTER — Other Ambulatory Visit: Payer: Self-pay | Admitting: Student

## 2019-12-03 LAB — HEPATITIS C ANTIBODY
Hepatitis C Ab: NONREACTIVE
SIGNAL TO CUT-OFF: 0.01 (ref <1.00)

## 2019-12-03 LAB — CBC
HCT: 40.9 % (ref 38.5–50.0)
Hemoglobin: 14 g/dL (ref 13.2–17.1)
MCH: 32.6 pg (ref 27.0–33.0)
MCHC: 34.2 g/dL (ref 32.0–36.0)
MCV: 95.3 fL (ref 80.0–100.0)
MPV: 10.5 fL (ref 7.5–12.5)
Platelets: 148 10*3/uL (ref 140–400)
RBC: 4.29 10*6/uL (ref 4.20–5.80)
RDW: 13.2 % (ref 11.0–15.0)
WBC: 4.5 10*3/uL (ref 3.8–10.8)

## 2019-12-03 LAB — PATHOLOGIST SMEAR REVIEW

## 2019-12-07 ENCOUNTER — Other Ambulatory Visit: Payer: Self-pay

## 2019-12-07 ENCOUNTER — Ambulatory Visit (HOSPITAL_COMMUNITY)
Admission: RE | Admit: 2019-12-07 | Discharge: 2019-12-07 | Disposition: A | Discharge status: Home / Self Care | Payer: Medicare Other | Source: Ambulatory Visit | Attending: Neurosurgery | Admitting: Neurosurgery

## 2019-12-07 ENCOUNTER — Other Ambulatory Visit (HOSPITAL_COMMUNITY): Payer: Self-pay | Admitting: Neurosurgery

## 2019-12-07 DIAGNOSIS — G912 (Idiopathic) normal pressure hydrocephalus: Secondary | ICD-10-CM | POA: Diagnosis not present

## 2019-12-07 MED ORDER — LIDOCAINE HCL (PF) 1 % IJ SOLN
5.0000 mL | Freq: Once | INTRAMUSCULAR | Status: AC
  Administered 2019-12-07: 5 mL via INTRADERMAL

## 2019-12-07 NOTE — Discharge Instructions (Signed)
Lumbar Puncture, Care After This sheet gives you information about how to care for yourself after your procedure. Your health care provider may also give you more specific instructions. If you have problems or questions, contact your health care provider. What can I expect after the procedure? After the procedure, it is common to have:  Mild discomfort or pain at the puncture site.  A mild headache that is relieved with pain medicines. Follow these instructions at home: Activity   Lie down flat or rest for as long as directed by your health care provider.  Return to your normal activities as told by your health care provider. Ask your health care provider what activities are safe for you.  Avoid lifting anything heavier than 10 lb (4.5 kg) for at least 12 hours after the procedure.  Do not drive for 24 hours if you were given a medicine to help you relax (sedative) during your procedure.  Do not drive or use heavy machinery while taking prescription pain medicine. Puncture site care  Remove or change your bandage (dressing) as told by your health care provider.  Check your puncture area every day for signs of infection. Check for: ? More pain. ? Redness or swelling. ? Fluid or blood leaking from the puncture site. ? Warmth. ? Pus or a bad smell. General instructions  Take over-the-counter and prescription medicines only as told by your health care provider.  Drink enough fluids to keep your urine clear or pale yellow. Your health care provider may recommend drinking caffeine to prevent a headache.  Keep all follow-up visits as told by your health care provider. This is important. Contact a health care provider if:  You have fever or chills.  You have nausea or vomiting.  You have a headache that lasts for more than 2 days or does not get better with medicine. Get help right away if:  You develop any of the following in your  legs: ? Weakness. ? Numbness. ? Tingling.  You are unable to control when you urinate or have a bowel movement (incontinence).  You have signs of infection around your puncture site, such as: ? More pain. ? Redness or swelling. ? Fluid or blood leakage. ? Warmth. ? Pus or a bad smell.  You are dizzy or you feel like you might faint.  You have a severe headache, especially when you sit or stand. Summary  A lumbar puncture is a procedure in which a small needle is inserted into the lower back to remove fluid that surrounds the brain and spinal cord.  After this procedure, it is common to have a headache and pain around the needle insertion area.  Lying flat, staying hydrated, and drinking caffeine can help prevent headaches.  Monitor your needle insertion site for signs of infection, including warmth, fluid, or more pain.  Get help right away if you develop leg weakness, leg numbness, incontinence, or severe headaches. This information is not intended to replace advice given to you by your health care provider. Make sure you discuss any questions you have with your health care provider. Document Revised: 06/13/2016 Document Reviewed: 06/13/2016 Elsevier Patient Education  2020 Elsevier Inc.  

## 2019-12-07 NOTE — Procedures (Signed)
Fluoro guided LP performed at L2-L3.  Opening pressure 19 cm H2O.  31 ml clear CSF obtained.  No complications.

## 2019-12-08 ENCOUNTER — Ambulatory Visit: Payer: Medicare Other | Admitting: Neurology

## 2019-12-16 DIAGNOSIS — R030 Elevated blood-pressure reading, without diagnosis of hypertension: Secondary | ICD-10-CM | POA: Diagnosis not present

## 2019-12-16 DIAGNOSIS — Z6829 Body mass index (BMI) 29.0-29.9, adult: Secondary | ICD-10-CM | POA: Diagnosis not present

## 2019-12-16 DIAGNOSIS — G912 (Idiopathic) normal pressure hydrocephalus: Secondary | ICD-10-CM | POA: Diagnosis not present

## 2019-12-21 ENCOUNTER — Other Ambulatory Visit: Payer: Self-pay | Admitting: Neurosurgery

## 2019-12-31 ENCOUNTER — Other Ambulatory Visit: Payer: Self-pay | Admitting: Primary Care

## 2020-01-04 NOTE — Progress Notes (Signed)
[id number] [place], [name] [name] [address] Phone: [phone] Fax: [phone]  [facility] [name], [name] [name] [place] [address] Phone: [phone] Fax: [phone]      Your procedure is scheduled on August 27  Report to [facility] Main Entrance "A" at 0530 A.M., and check in at the Admitting office.  Call this number if you have problems the morning of surgery:  [phone]  Call [phone] if you have any questions prior to your surgery date Monday-Friday 8am-4pm    Remember:  Do not eat or drink after midnight the night before your surgery     Take these medicines the morning of surgery with A SIP OF WATER  acetaminophen (TYLENOL)  atorvastatin (LIPITOR) metoprolol succinate (TOPROL-XL) omeprazole (PRILOSEC) Eye drops if needed  Follow your surgeon's instructions on when to stop XARELTO.  If no instructions were given by your surgeon then you will need to call the office to get those instructions.     As of today, STOP taking any Aspirin (unless otherwise instructed by your surgeon) Aleve, Naproxen, Ibuprofen, Motrin, Advil, Goody's, BC's, all herbal medications, fish oil, and all vitamins. diclofenac Sodium (VOLTAREN)                      Do not wear jewelry            Do not wear lotions, powders, colognes, or deodorant.            Men may shave face and neck.            Do not bring valuables to the hospital.            [facility] is not responsible for any belongings or valuables.  Do NOT Smoke (Tobacco/Vaping) or drink Alcohol 24 hours prior to your procedure If you use a CPAP at night, you may bring all equipment for your overnight stay.   Contacts, glasses, dentures or bridgework may not be worn into surgery.      For patients admitted to the hospital, discharge time will be determined  by your treatment team.   Patients discharged the day of surgery will not be allowed to drive home, and someone needs to stay with them for 24 hours.    Special instructions:   [place]- Preparing For Surgery  Before surgery, you can play an important role. Because skin is not sterile, your skin needs to be as free of germs as possible. You can reduce the number of germs on your skin by washing with CHG (chlorahexidine gluconate) Soap before surgery.  CHG is an antiseptic cleaner which kills germs and bonds with the skin to continue killing germs even after washing.    Oral Hygiene is also important to reduce your risk of infection.  Remember - BRUSH YOUR TEETH THE MORNING OF SURGERY WITH YOUR REGULAR TOOTHPASTE  Please do not use if you have an allergy to CHG or antibacterial soaps. If your skin becomes reddened/irritated stop using the CHG.  Do not shave (including legs and underarms) for at least 48 hours prior to first CHG shower. It is OK to shave your face.  Please follow these instructions carefully.   1. Shower the NIGHT BEFORE SURGERY and the MORNING OF SURGERY with CHG Soap.   2. If you  chose to wash your hair, wash your hair first as usual with your normal shampoo.  3. After you shampoo, rinse your hair and body thoroughly to remove the shampoo.  4. Use CHG as you would any other liquid soap. You can apply CHG directly to the skin and wash gently with a scrungie or a clean washcloth.   5. Apply the CHG Soap to your body ONLY FROM THE NECK DOWN.  Do not use on open wounds or open sores. Avoid contact with your eyes, ears, mouth and genitals (private parts). Wash Face and genitals (private parts)  with your normal soap.   6. Wash thoroughly, paying special attention to the area where your surgery will be performed.  7. Thoroughly rinse your body with warm water from the neck down.  8. DO NOT shower/wash with your normal soap after using and rinsing off the CHG  Soap.  9. Pat yourself dry with a CLEAN TOWEL.  10. Wear CLEAN PAJAMAS to bed the night before surgery  11. Place CLEAN SHEETS on your bed the night of your first shower and DO NOT SLEEP WITH PETS.   Day of Surgery: Wear Clean/Comfortable clothing the morning of surgery Do not apply any deodorants/lotions.   Remember to brush your teeth WITH YOUR REGULAR TOOTHPASTE.   Please read over the following fact sheets that you were given.

## 2020-01-05 ENCOUNTER — Other Ambulatory Visit (HOSPITAL_COMMUNITY)
Admission: RE | Admit: 2020-01-05 | Discharge: 2020-01-05 | Disposition: A | Discharge status: Home / Self Care | Payer: Medicare Other | Source: Ambulatory Visit | Attending: Neurosurgery | Admitting: Neurosurgery

## 2020-01-05 ENCOUNTER — Encounter (HOSPITAL_COMMUNITY): Payer: Self-pay | Admitting: Neurosurgery

## 2020-01-05 ENCOUNTER — Encounter (HOSPITAL_COMMUNITY)
Admission: RE | Admit: 2020-01-05 | Discharge: 2020-01-05 | Disposition: A | Discharge status: Home / Self Care | Payer: Medicare Other | Source: Ambulatory Visit | Attending: Neurosurgery | Admitting: Neurosurgery

## 2020-01-05 ENCOUNTER — Other Ambulatory Visit: Payer: Self-pay

## 2020-01-05 DIAGNOSIS — Z85828 Personal history of other malignant neoplasm of skin: Secondary | ICD-10-CM | POA: Diagnosis not present

## 2020-01-05 DIAGNOSIS — Z7901 Long term (current) use of anticoagulants: Secondary | ICD-10-CM | POA: Diagnosis not present

## 2020-01-05 DIAGNOSIS — E782 Mixed hyperlipidemia: Secondary | ICD-10-CM | POA: Diagnosis not present

## 2020-01-05 DIAGNOSIS — Z79899 Other long term (current) drug therapy: Secondary | ICD-10-CM | POA: Diagnosis not present

## 2020-01-05 DIAGNOSIS — I480 Paroxysmal atrial fibrillation: Secondary | ICD-10-CM | POA: Diagnosis not present

## 2020-01-05 DIAGNOSIS — G912 (Idiopathic) normal pressure hydrocephalus: Secondary | ICD-10-CM | POA: Diagnosis not present

## 2020-01-05 DIAGNOSIS — M25559 Pain in unspecified hip: Secondary | ICD-10-CM | POA: Diagnosis present

## 2020-01-05 DIAGNOSIS — G8929 Other chronic pain: Secondary | ICD-10-CM | POA: Diagnosis present

## 2020-01-05 DIAGNOSIS — I714 Abdominal aortic aneurysm, without rupture: Secondary | ICD-10-CM | POA: Diagnosis not present

## 2020-01-05 DIAGNOSIS — Z8249 Family history of ischemic heart disease and other diseases of the circulatory system: Secondary | ICD-10-CM | POA: Diagnosis not present

## 2020-01-05 DIAGNOSIS — I10 Essential (primary) hypertension: Secondary | ICD-10-CM | POA: Diagnosis present

## 2020-01-05 DIAGNOSIS — Z820 Family history of epilepsy and other diseases of the nervous system: Secondary | ICD-10-CM | POA: Diagnosis not present

## 2020-01-05 DIAGNOSIS — I471 Supraventricular tachycardia: Secondary | ICD-10-CM | POA: Diagnosis not present

## 2020-01-05 DIAGNOSIS — Z87891 Personal history of nicotine dependence: Secondary | ICD-10-CM | POA: Diagnosis not present

## 2020-01-05 DIAGNOSIS — Z20822 Contact with and (suspected) exposure to covid-19: Secondary | ICD-10-CM | POA: Diagnosis not present

## 2020-01-05 DIAGNOSIS — Z8261 Family history of arthritis: Secondary | ICD-10-CM | POA: Diagnosis not present

## 2020-01-05 DIAGNOSIS — Z91030 Bee allergy status: Secondary | ICD-10-CM | POA: Diagnosis not present

## 2020-01-05 DIAGNOSIS — Z91018 Allergy to other foods: Secondary | ICD-10-CM | POA: Diagnosis not present

## 2020-01-05 DIAGNOSIS — Z87442 Personal history of urinary calculi: Secondary | ICD-10-CM | POA: Diagnosis not present

## 2020-01-05 DIAGNOSIS — K219 Gastro-esophageal reflux disease without esophagitis: Secondary | ICD-10-CM | POA: Diagnosis not present

## 2020-01-05 DIAGNOSIS — G910 Communicating hydrocephalus: Secondary | ICD-10-CM | POA: Diagnosis not present

## 2020-01-05 DIAGNOSIS — M549 Dorsalgia, unspecified: Secondary | ICD-10-CM | POA: Diagnosis present

## 2020-01-05 DIAGNOSIS — R519 Headache, unspecified: Secondary | ICD-10-CM | POA: Diagnosis present

## 2020-01-05 DIAGNOSIS — Z01812 Encounter for preprocedural laboratory examination: Secondary | ICD-10-CM | POA: Insufficient documentation

## 2020-01-05 DIAGNOSIS — M1990 Unspecified osteoarthritis, unspecified site: Secondary | ICD-10-CM | POA: Diagnosis not present

## 2020-01-05 LAB — BASIC METABOLIC PANEL WITH GFR
Anion gap: 10 (ref 5–15)
BUN: 21 mg/dL (ref 8–23)
CO2: 23 mmol/L (ref 22–32)
Calcium: 9.8 mg/dL (ref 8.9–10.3)
Chloride: 108 mmol/L (ref 98–111)
Creatinine, Ser: 0.93 mg/dL (ref 0.61–1.24)
GFR calc Af Amer: >60 mL/min (ref >60)
GFR calc non Af Amer: >60 mL/min (ref >60)
Glucose, Bld: 107 mg/dL — ABNORMAL HIGH (ref 70–99)
Potassium: 4.2 mmol/L (ref 3.5–5.1)
Sodium: 141 mmol/L (ref 135–145)

## 2020-01-05 LAB — CBC
HCT: 44.1 % (ref 39.0–52.0)
Hemoglobin: 14.9 g/dL (ref 13.0–17.0)
MCH: 33.1 pg (ref 26.0–34.0)
MCHC: 33.8 g/dL (ref 30.0–36.0)
MCV: 98 fL (ref 80.0–100.0)
Platelets: 141 K/uL — ABNORMAL LOW (ref 150–400)
RBC: 4.5 MIL/uL (ref 4.22–5.81)
RDW: 12.8 % (ref 11.5–15.5)
WBC: 4.2 K/uL (ref 4.0–10.5)
nRBC: 0 % (ref 0.0–0.2)

## 2020-01-05 LAB — SURGICAL PCR SCREEN
MRSA, PCR: NEGATIVE
Staphylococcus aureus: POSITIVE — AB

## 2020-01-05 LAB — SARS CORONAVIRUS 2 (TAT 6-24 HRS): SARS Coronavirus 2: NEGATIVE

## 2020-01-05 NOTE — Progress Notes (Signed)
PCP - Alma Friendly Cardiologist - [place], [place]   Chest x-ray - n/a EKG - 09/15/19 Stress Test - 2013 ECHO - 05/23/2016 Cardiac Cath - > 10 years  Sleep Study - denies   Blood Thinner Instructions: Xarelto LD 01/05/20 Aspirin Instructions: n/a   COVID TEST- 01/05/20 going today   Anesthesia review: Yes, hx afib  Patient denies shortness of breath, fever, cough and chest pain at PAT appointment   All instructions explained to the patient, with a verbal understanding of the material. Patient agrees to go over the instructions while at home for a better understanding. Patient also instructed to self quarantine after being tested for COVID-19. The opportunity to ask questions was provided.

## 2020-01-05 NOTE — Progress Notes (Signed)
PCP - Alma Friendly Cardiologist -    Chest x-ray - n/a EKG - 09/15/19 Stress Test - 2013 ECHO - 05/23/2016 Cardiac Cath - > 10 years  Sleep Study - denies   Blood Thinner Instructions: Xarelto LD 01/05/20 Aspirin Instructions: n/a   COVID TEST- 01/05/20 going today   Anesthesia review: Yes, hx afib  Patient denies shortness of breath, fever, cough and chest pain at PAT appointment   All instructions explained to the patient, with a verbal understanding of the material. Patient agrees to go over the instructions while at home for a better understanding. Patient also instructed to self quarantine after being tested for COVID-19. The opportunity to ask questions was provided.

## 2020-01-06 ENCOUNTER — Other Ambulatory Visit: Payer: Self-pay | Admitting: Neurosurgery

## 2020-01-06 NOTE — Progress Notes (Signed)
Anesthesia Chart Review:  Follows with cardiology for history of paroxysmal A. fib.  Recent stress test October 2020 was negative.  Patient was recently cleared by cardiology to hold Xarelto prior to cerebral angiogram.  Per telephone encounter 10/22/19, "Chart reviewed as part of pre-operative protocol coverage. Given past medical history and time since last visit, based on ACC/AHA guidelines, Adam Juarez would be at acceptable risk for the planned procedure without further cardiovascular testing. Patient with diagnosis ofafibon Xareltofor anticoagulation.   Procedure:cerebral angiogram Date of procedure:ASAP  CHADS2-VASc score of3 (age x2, CAD) CrCl65mL/min Platelet [id number]  His Eliquis may be held for 2 days prior to the procedure."  Preop labs reviewed, unremarkable.  Stress test 03/02/2019:  There was no ST segment deviation noted during stress.  No T wave inversion was noted during stress.  Defect 1: There is a medium defect of mild severity present in the basal inferior and mid inferior location. This is likely due to diaphragm attenuation.  The study is normal.  This is a low risk study.  The left ventricular ejection fraction is normal (55-65%).   Adam Juarez [facility] Short Stay Center/Anesthesiology Phone [phone] 01/06/2020 10:30 AM

## 2020-01-06 NOTE — Anesthesia Preprocedure Evaluation (Addendum)
Anesthesia Evaluation  Patient identified by MRN, date of birth, ID band Patient awake    Reviewed: Allergy & Precautions, NPO status , Patient's Chart, lab work & pertinent test results, reviewed documented beta blocker date and time   History of Anesthesia Complications Negative for: history of anesthetic complications  Airway Mallampati: II  TM Distance: >3 FB Neck ROM: Full    Dental  (+) Edentulous Upper, Dental Advisory Given   Pulmonary former smoker,  01/05/2020 SARS coronavirus NEG   breath sounds clear to auscultation       Cardiovascular hypertension, Pt. on medications and Pt. on home beta blockers (-) angina+ dysrhythmias Atrial Fibrillation and Supra Ventricular Tachycardia  Rhythm:Regular Rate:Normal  '20 Stress:  normal perfusion, EF 55-65% '18 ECHO: normal LVF, no sig valvular abnormalities   Neuro/Psych  Headaches, Chronic back pain NPH    GI/Hepatic Neg liver ROS, GERD  Medicated and Controlled,  Endo/Other  negative endocrine ROS  Renal/GU negative Renal ROS     Musculoskeletal   Abdominal   Peds  Hematology Xarelto: last dose Tuesday   Anesthesia Other Findings   Reproductive/Obstetrics                           Anesthesia Physical Anesthesia Plan  ASA: III  Anesthesia Plan: General   Post-op Pain Management:    Induction: Intravenous  PONV Risk Score and Plan: 2 and Ondansetron and Dexamethasone  Airway Management Planned: Oral ETT  Additional Equipment: None  Intra-op Plan:   Post-operative Plan: Extubation in OR  Informed Consent: I have reviewed the patients History and Physical, chart, labs and discussed the procedure including the risks, benefits and alternatives for the proposed anesthesia with the patient or authorized representative who has indicated his/her understanding and acceptance.     Dental advisory given  Plan Discussed with: CRNA and  Surgeon  Anesthesia Plan Comments: (PAT note by Karoline Caldwell, PA-C: Follows with cardiology for history of paroxysmal A. fib.  Recent stress test October 2020 was negative.  Patient was recently cleared by cardiology to hold Xarelto prior to cerebral angiogram.  Per telephone encounter 10/22/19, "Chart reviewed as part of pre-operative protocol coverage. Given past medical history and time since last visit, based on ACC/AHA guidelines, Adam Juarez would be at acceptable risk for the planned procedure without further cardiovascular testing. Patient with diagnosis ofafibon Xareltofor anticoagulation.   Procedure:cerebral angiogram Date of procedure:ASAP  CHADS2-VASc score of3 (age x2, CAD) CrCl38mL/min Platelet [id number]  His Eliquis may be held for 2 days prior to the procedure."  Preop labs reviewed, unremarkable.  Stress test 03/02/2019: There was no ST segment deviation noted during stress. No T wave inversion was noted during stress. Defect 1: There is a medium defect of mild severity present in the basal inferior and mid inferior location. This is likely due to diaphragm attenuation. The study is normal. This is a low risk study. The left ventricular ejection fraction is normal (55-65%). )      Anesthesia Quick Evaluation

## 2020-01-07 NOTE — H&P (Signed)
Chief Complaint   No chief complaint on file.   HPI   HPI: Adam Juarez is a 79 y.o. 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 As part of work up and MRI brain was obtained which revealed supratentorial ventriculomegaly. He was subsequently referred for high volume LP which improved his symptoms.  It was therefore recommended he undergo placement of VP shunt.  He presents today for surgery.  He is without any concerns.  Of note, the patient does have a history of atrial fibrillation. He has held his Xarelto for the past 72 hours in preparation for surgery today.   Patient Active Problem List   Diagnosis Date Noted  . Excessive daytime sleepiness 10/19/2019  . Hip pain 07/15/2019  . Medicare annual wellness visit, subsequent 02/06/2019  . Memory changes 11/21/2018  . Aortic atherosclerosis ([place]) 03/27/2018  . PAD (peripheral artery disease) ([place]) 03/27/2018  . Smoking history 03/27/2018  . Chronic left shoulder pain 03/13/2018  . History of systemic reaction to bee sting 01/28/2018  . Prediabetes 01/28/2018  . History of elevated PSA 01/28/2018  . Fall with injury 05/22/2017  . AAA (abdominal aortic aneurysm) ([place]) 05/22/2017  . Frequent headaches 01/15/2017  . Osteoarthritis 07/13/2016  . Chronic foot pain 07/13/2016  . Encounter for abdominal aortic aneurysm (AAA) screening 02/17/2016  . Mixed hyperlipidemia 11/27/2015  . Chronic back pain 11/27/2015  . Chronic fatigue 11/27/2015  . SVT (supraventricular tachycardia) ([place]) 09/16/2015  . Paroxysmal atrial fibrillation ([place]) 09/16/2015  . 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 Esophageal reflux 09/16/2015  . Preventative health care 09/16/2015    PMH: Past Medical History:  Diagnosis Date  . Arthritis    fingers  . 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 Chicken pox   .  Dysrhythmia 09/2013   Hx. a-fib x 1 episode patient states he "auto corrected" seen at [facility]  . GERD (gastroesophageal reflux disease)   . Headache    poor posture - none recently  . History of kidney stones   . PSVT (paroxysmal supraventricular tachycardia) ([place]) 2009   Controlled with "breathing process"  . Renal stone 9/08, 6/15  . Wears dentures    full upper    PSH: Past Surgical History:  Procedure Laterality Date  . CARDIAC CATHETERIZATION  [address] [place]  . CATARACT EXTRACTION W/PHACO Left 07/27/2015   Procedure: CATARACT EXTRACTION PHACO AND INTRAOCULAR LENS PLACEMENT (IOC);  Surgeon: Leandrew Koyanagi, MD;  Location: [facility];  Service: Ophthalmology;  Laterality: Left;  TORIC  . CATARACT EXTRACTION W/PHACO Right 08/24/2015   Procedure: CATARACT EXTRACTION PHACO AND INTRAOCULAR LENS PLACEMENT (IOC);  Surgeon: Leandrew Koyanagi, MD;  Location: [facility];  Service: Ophthalmology;  Laterality: Right;  TORIC  . HEMORROIDECTOMY  2010   banding  . IR ANGIO INTRA EXTRACRAN SEL COM CAROTID INNOMINATE BILAT MOD SED  10/30/2019  . IR ANGIO VERTEBRAL SEL VERTEBRAL UNI R MOD SED  10/30/2019  . IR US GUIDE VASC ACCESS RIGHT  10/30/2019  . KIDNEY STONE SURGERY  9/08, 6/15   lithotripsy  . PROSTATE BIOPSY  2007  . SKIN CANCER EXCISION    . TOENAIL TRIMMING  11/2017   Dr. Elvina Mattes  . TONSILLECTOMY    . VASECTOMY      No medications prior to admission.    SH: Social History   Tobacco Use  .  Smoking status: Former Smoker    Packs/day: 0.50    Years: 38.00    Pack years: 19.00    Types: Cigarettes    Quit date: 07/13/1997    Years since quitting: 22.5  . Smokeless tobacco: Never Used  Vaping Use  . Vaping Use: Never used  Substance Use Topics  . Alcohol use: No  . Drug use: No    MEDS: Prior to Admission medications   Medication Sig Start Date End Date Taking? Authorizing Provider  acetaminophen (TYLENOL) 500 MG tablet Take  1,000 mg by mouth every 8 (eight) hours as needed for moderate pain.   Yes [provider]  ascorbic acid (VITAMIN C) 500 MG tablet Take 500 mg by mouth daily.   Yes [provider]  diclofenac Sodium (VOLTAREN) 1 % GEL Apply 1 application topically 4 (four) times daily as needed (pain).   Yes [provider]  EPINEPHrine (EPIPEN 2-PAK) 0.3 mg/0.3 mL IJ SOAJ injection Inject 0.3 mLs (0.3 mg total) into the muscle as needed for anaphylaxis. 10/21/19  Yes Pleas Koch, NP  Magnesium Cl-Calcium Carbonate (SLOW-MAG PO) Take 1 tablet by mouth daily.    Yes [provider]  Melatonin 1 MG CAPS Take 1 mg by mouth at bedtime as needed (sleep).   Yes [provider]  metoprolol succinate (TOPROL-XL) 25 MG 24 hr tablet TAKE 1 TABLET IN THE MORNING AND AT BEDTIME Patient taking differently: Take 25 mg by mouth 2 (two) times daily.  11/30/19  Yes Gollan, Kathlene November, MD  Multiple Vitamins-Minerals (MULTIVITAMIN WITH MINERALS) tablet Take 1 tablet by mouth daily.   Yes [provider]  Multiple Vitamins-Minerals (PRESERVISION AREDS 2 PO) Take 1 capsule by mouth 2 (two) times daily.    Yes [provider]  omeprazole (PRILOSEC) 20 MG capsule TAKE 1 CAPSULE DAILY Patient taking differently: Take 20 mg by mouth daily.  08/14/19  Yes Pleas Koch, NP  Polyethyl Glycol-Propyl Glycol (SYSTANE OP) Apply 1 drop to eye 3 (three) times daily as needed (dry eyes).    Yes [provider]  Potassium Gluconate 550 MG TABS Take 550 mg by mouth daily.   Yes [provider]  Probiotic Product (PROBIOTIC DAILY PO) Take 1 capsule by mouth daily.    Yes [provider]  vitamin B-12 (CYANOCOBALAMIN) 1000 MCG tablet Take 1,000 mcg by mouth daily.   Yes [provider]  XARELTO 20 MG TABS tablet TAKE 1 TABLET DAILY WITH SUPPER Patient taking differently: Take 20 mg by mouth daily with supper.  05/21/19  Yes Minna Merritts, MD    atorvastatin (LIPITOR) 20 MG tablet TAKE 1 TABLET DAILY FOR CHOLESTEROL 12/31/19   Pleas Koch, NP    ALLERGY: Allergies  Allergen Reactions  . Bee Venom Anaphylaxis and Hives  . Other     Pistachios - tickles throat     Social History   Tobacco Use  . Smoking status: Former Smoker    Packs/day: 0.50    Years: 38.00    Pack years: 19.00    Types: Cigarettes    Quit date: 07/13/1997    Years since quitting: 22.5  . Smokeless tobacco: Never Used  Substance Use Topics  . Alcohol use: No     Family History  Problem Relation Age of Onset  . AAA (abdominal aortic aneurysm) Father   . Parkinson's disease Mother   . Arthritis Mother      ROS   ROS  Exam  There were no vitals filed for this visit. General appearance: WDWN, NAD Eyes: No scleral injection Cardiovascular: Regular rate and rhythm without murmurs, rubs, gallops. No edema or variciosities. Distal pulses normal. Pulmonary: Effort normal, non-labored breathing Musculoskeletal:     Muscle tone upper extremities: Normal    Muscle tone lower extremities: Normal    Motor exam: Upper Extremities Deltoid Bicep Tricep Grip  Right 5/5 5/5 5/5 5/5  Left 5/5 5/5 5/5 5/5   Lower Extremity IP Quad PF DF EHL  Right 5/5 5/5 5/5 5/5 5/5  Left 5/5 5/5 5/5 5/5 5/5   Neurological Mental Status:    - Patient is awake, alert, oriented to person, place, month, year, and situation    - Patient is able to give a clear and coherent history.    - No signs of aphasia or neglect Cranial Nerves    - II: Visual Fields are full. PERRL    - III/IV/VI: EOMI without ptosis or diploplia.     - V: Facial sensation is grossly normal    - VII: Facial movement is symmetric.     - VIII: hearing is intact to voice    - X: Uvula elevates symmetrically    - XI: Shoulder shrug is symmetric.    - XII: tongue is midline without atrophy or fasciculations.  Sensory: Sensation grossly intact to LT  Results - Imaging/Labs   Results  for orders placed or performed during the hospital encounter of 01/05/20 (from the past 48 hour(s))  SARS CORONAVIRUS 2 (TAT 6-24 HRS) Nasopharyngeal Nasopharyngeal Swab     Status: None   Collection Time: 01/05/20 10:44 AM   Specimen: Nasopharyngeal Swab  Result Value Ref Range   SARS Coronavirus 2 NEGATIVE NEGATIVE    Comment: (NOTE) SARS-CoV-2 target nucleic acids are NOT DETECTED.  The SARS-CoV-2 RNA is generally detectable in upper and lower respiratory specimens during the acute phase of infection. Negative results do not preclude SARS-CoV-2 infection, do not rule out co-infections with other pathogens, and should not be used as the sole basis for treatment or other patient management decisions. Negative results must be combined with clinical observations, patient history, and epidemiological information. The expected result is Negative.  Fact Sheet for Patients: SugarRoll.be  Fact Sheet for Healthcare Providers: https://www.woods-mathews.com/  This test is not yet approved or cleared by the [place] FDA and  has been authorized for detection and/or diagnosis of SARS-CoV-2 by FDA under an Emergency Use Authorization (EUA). This EUA will remain  in effect (meaning this test can be used) for the duration of the COVID-19 declaration under Se ction 564(b)(1) of the Act, 21 U.S.C. section 360bbb-3(b)(1), unless the authorization is terminated or revoked sooner.  Performed at [facility], [place] [address] [place], [place] [address]     No results found.  IMAGING: MRI of the brain dated 09/25/2019 was personally reviewed.  This does demonstrate ventriculomegaly of the lateral and 3rd ventricles, somewhat out of proportion to the degree of mild cerebral atrophy.  Physical therapy evaluation pre and post lumbar puncture was personally reviewed.  Overall, there was mild but clear improvement in both cognitive and functional  scores and mini-mental status score is after the high-volume lumbar puncture.  Impression/Plan   79 y.o. 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  I therefore think that placement of ventriculoperitoneal shunt is reasonable.   We will proceed with laparoscopic-assisted placement  of ventriculoperitoneal shunt on the right.  We have reviewed the indications for surgery, the associated risks, benefits and alternatives at length in the office.  All questions today were answered and consent was obtained.  Consuella Lose, MD [facility]

## 2020-01-08 ENCOUNTER — Other Ambulatory Visit: Payer: Self-pay

## 2020-01-08 ENCOUNTER — Encounter (HOSPITAL_COMMUNITY): Payer: Self-pay | Admitting: Neurosurgery

## 2020-01-08 ENCOUNTER — Inpatient Hospital Stay (HOSPITAL_COMMUNITY): Payer: Medicare Other | Admitting: Certified Registered"

## 2020-01-08 ENCOUNTER — Inpatient Hospital Stay (HOSPITAL_COMMUNITY): Payer: Medicare Other | Admitting: Physician Assistant

## 2020-01-08 ENCOUNTER — Encounter (HOSPITAL_COMMUNITY)
Admission: RE | Disposition: A | Discharge status: Home / Self Care | Payer: Self-pay | Source: Home / Self Care | Attending: Neurosurgery

## 2020-01-08 ENCOUNTER — Inpatient Hospital Stay (HOSPITAL_COMMUNITY)
Admission: RE | Admit: 2020-01-08 | Discharge: 2020-01-09 | DRG: 032 | Disposition: A | Discharge status: Home / Self Care | Payer: Medicare Other | Attending: Neurosurgery | Admitting: Neurosurgery

## 2020-01-08 DIAGNOSIS — M1990 Unspecified osteoarthritis, unspecified site: Secondary | ICD-10-CM | POA: Diagnosis present

## 2020-01-08 DIAGNOSIS — Z79899 Other long term (current) drug therapy: Secondary | ICD-10-CM | POA: Diagnosis not present

## 2020-01-08 DIAGNOSIS — E782 Mixed hyperlipidemia: Secondary | ICD-10-CM | POA: Diagnosis not present

## 2020-01-08 DIAGNOSIS — Z8249 Family history of ischemic heart disease and other diseases of the circulatory system: Secondary | ICD-10-CM | POA: Diagnosis not present

## 2020-01-08 DIAGNOSIS — Z820 Family history of epilepsy and other diseases of the nervous system: Secondary | ICD-10-CM

## 2020-01-08 DIAGNOSIS — M549 Dorsalgia, unspecified: Secondary | ICD-10-CM | POA: Diagnosis present

## 2020-01-08 DIAGNOSIS — G912 (Idiopathic) normal pressure hydrocephalus: Principal | ICD-10-CM | POA: Diagnosis present

## 2020-01-08 DIAGNOSIS — Z8261 Family history of arthritis: Secondary | ICD-10-CM

## 2020-01-08 DIAGNOSIS — Z91018 Allergy to other foods: Secondary | ICD-10-CM | POA: Diagnosis not present

## 2020-01-08 DIAGNOSIS — R519 Headache, unspecified: Secondary | ICD-10-CM | POA: Diagnosis present

## 2020-01-08 DIAGNOSIS — Z87442 Personal history of urinary calculi: Secondary | ICD-10-CM | POA: Diagnosis not present

## 2020-01-08 DIAGNOSIS — I10 Essential (primary) hypertension: Secondary | ICD-10-CM | POA: Diagnosis present

## 2020-01-08 DIAGNOSIS — G8929 Other chronic pain: Secondary | ICD-10-CM | POA: Diagnosis present

## 2020-01-08 DIAGNOSIS — I714 Abdominal aortic aneurysm, without rupture: Secondary | ICD-10-CM | POA: Diagnosis present

## 2020-01-08 DIAGNOSIS — G910 Communicating hydrocephalus: Secondary | ICD-10-CM | POA: Diagnosis present

## 2020-01-08 DIAGNOSIS — Z20822 Contact with and (suspected) exposure to covid-19: Secondary | ICD-10-CM | POA: Diagnosis present

## 2020-01-08 DIAGNOSIS — I471 Supraventricular tachycardia: Secondary | ICD-10-CM | POA: Diagnosis present

## 2020-01-08 DIAGNOSIS — Z85828 Personal history of other malignant neoplasm of skin: Secondary | ICD-10-CM | POA: Diagnosis not present

## 2020-01-08 DIAGNOSIS — Z7901 Long term (current) use of anticoagulants: Secondary | ICD-10-CM

## 2020-01-08 DIAGNOSIS — Z87891 Personal history of nicotine dependence: Secondary | ICD-10-CM

## 2020-01-08 DIAGNOSIS — Z91030 Bee allergy status: Secondary | ICD-10-CM | POA: Diagnosis not present

## 2020-01-08 DIAGNOSIS — M25559 Pain in unspecified hip: Secondary | ICD-10-CM | POA: Diagnosis present

## 2020-01-08 DIAGNOSIS — K219 Gastro-esophageal reflux disease without esophagitis: Secondary | ICD-10-CM | POA: Diagnosis present

## 2020-01-08 DIAGNOSIS — I480 Paroxysmal atrial fibrillation: Secondary | ICD-10-CM | POA: Diagnosis not present

## 2020-01-08 HISTORY — PX: LAPAROSCOPIC REVISION VENTRICULAR-PERITONEAL (V-P) SHUNT: SHX5924

## 2020-01-08 HISTORY — PX: VENTRICULOPERITONEAL SHUNT: SHX204

## 2020-01-08 HISTORY — DX: Personal history of urinary calculi: Z87.442

## 2020-01-08 SURGERY — SHUNT INSERTION VENTRICULAR-PERITONEAL
Anesthesia: General | Laterality: Right

## 2020-01-08 MED ORDER — THROMBIN 5000 UNITS EX SOLR
CUTANEOUS | Status: AC
  Filled 2020-01-08: qty 5000

## 2020-01-08 MED ORDER — LIDOCAINE 2% (20 MG/ML) 5 ML SYRINGE
INTRAMUSCULAR | Status: AC
  Filled 2020-01-08: qty 5

## 2020-01-08 MED ORDER — BISACODYL 5 MG PO TBEC: 5.0000 mg | DELAYED_RELEASE_TABLET | Freq: Every day | ORAL | Status: DC | PRN

## 2020-01-08 MED ORDER — LACTATED RINGERS IV SOLN: INTRAVENOUS | Status: DC

## 2020-01-08 MED ORDER — CEFAZOLIN SODIUM-DEXTROSE 2-3 GM-%(50ML) IV SOLR
INTRAVENOUS | Status: DC | PRN
  Administered 2020-01-08: 2 g via INTRAVENOUS

## 2020-01-08 MED ORDER — FENTANYL CITRATE (PF) 100 MCG/2ML IJ SOLN: 25.0000 ug | INTRAMUSCULAR | Status: DC | PRN

## 2020-01-08 MED ORDER — PHENYLEPHRINE HCL (PRESSORS) 10 MG/ML IV SOLN
INTRAVENOUS | Status: DC | PRN
  Administered 2020-01-08 (×3): 80 ug via INTRAVENOUS

## 2020-01-08 MED ORDER — BACITRACIN ZINC 500 UNIT/GM EX OINT
TOPICAL_OINTMENT | CUTANEOUS | Status: AC
  Filled 2020-01-08: qty 28.35

## 2020-01-08 MED ORDER — LIDOCAINE-EPINEPHRINE 1 %-1:100000 IJ SOLN
INTRAMUSCULAR | Status: AC
  Filled 2020-01-08: qty 1

## 2020-01-08 MED ORDER — ONDANSETRON HCL 4 MG/2ML IJ SOLN: 4.0000 mg | INTRAMUSCULAR | Status: DC | PRN

## 2020-01-08 MED ORDER — SENNA 8.6 MG PO TABS
1.0000 | ORAL_TABLET | Freq: Two times a day (BID) | ORAL | Status: DC
  Administered 2020-01-08 – 2020-01-09 (×3): 8.6 mg via ORAL
  Filled 2020-01-08 (×3): qty 1

## 2020-01-08 MED ORDER — SODIUM CHLORIDE 0.9 % IV SOLN
INTRAVENOUS | Status: DC | PRN
  Administered 2020-01-08: 500 mL

## 2020-01-08 MED ORDER — THROMBIN 5000 UNITS EX SOLR
OROMUCOSAL | Status: DC | PRN
  Administered 2020-01-08: 5 mL via TOPICAL

## 2020-01-08 MED ORDER — ACETAMINOPHEN 650 MG RE SUPP: 650.0000 mg | RECTAL | Status: DC | PRN

## 2020-01-08 MED ORDER — BUPIVACAINE HCL (PF) 0.25 % IJ SOLN
INTRAMUSCULAR | Status: AC
  Filled 2020-01-08: qty 30

## 2020-01-08 MED ORDER — ORAL CARE MOUTH RINSE: 15.0000 mL | Freq: Once | OROMUCOSAL | Status: AC

## 2020-01-08 MED ORDER — BUPIVACAINE HCL (PF) 0.5 % IJ SOLN
INTRAMUSCULAR | Status: AC
  Filled 2020-01-08: qty 30

## 2020-01-08 MED ORDER — BUPIVACAINE HCL (PF) 0.25 % IJ SOLN
INTRAMUSCULAR | Status: DC | PRN
  Administered 2020-01-08: 5 mL

## 2020-01-08 MED ORDER — MEPERIDINE HCL 25 MG/ML IJ SOLN: 6.2500 mg | INTRAMUSCULAR | Status: DC | PRN

## 2020-01-08 MED ORDER — PROMETHAZINE HCL 25 MG PO TABS: 12.5000 mg | ORAL_TABLET | ORAL | Status: DC | PRN

## 2020-01-08 MED ORDER — PROMETHAZINE HCL 25 MG/ML IJ SOLN: 6.2500 mg | INTRAMUSCULAR | Status: DC | PRN

## 2020-01-08 MED ORDER — NALOXONE HCL 0.4 MG/ML IJ SOLN: 0.0800 mg | INTRAMUSCULAR | Status: DC | PRN

## 2020-01-08 MED ORDER — ROCURONIUM BROMIDE 10 MG/ML (PF) SYRINGE
PREFILLED_SYRINGE | INTRAVENOUS | Status: DC | PRN
  Administered 2020-01-08: 5 mg via INTRAVENOUS
  Administered 2020-01-08: 60 mg via INTRAVENOUS
  Administered 2020-01-08: 20 mg via INTRAVENOUS

## 2020-01-08 MED ORDER — MIDAZOLAM HCL 2 MG/2ML IJ SOLN: 0.5000 mg | Freq: Once | INTRAMUSCULAR | Status: DC | PRN

## 2020-01-08 MED ORDER — BUPIVACAINE HCL (PF) 0.5 % IJ SOLN
INTRAMUSCULAR | Status: DC | PRN
  Administered 2020-01-08: 2 mL

## 2020-01-08 MED ORDER — DEXAMETHASONE SODIUM PHOSPHATE 10 MG/ML IJ SOLN
INTRAMUSCULAR | Status: DC | PRN
  Administered 2020-01-08: 10 mg via INTRAVENOUS

## 2020-01-08 MED ORDER — HYDROMORPHONE HCL 1 MG/ML IJ SOLN: 0.5000 mg | INTRAMUSCULAR | Status: DC | PRN

## 2020-01-08 MED ORDER — FLEET ENEMA 7-19 GM/118ML RE ENEM: 1.0000 | ENEMA | Freq: Once | RECTAL | Status: DC | PRN

## 2020-01-08 MED ORDER — LIDOCAINE 2% (20 MG/ML) 5 ML SYRINGE
INTRAMUSCULAR | Status: DC | PRN
  Administered 2020-01-08: 20 mg via INTRAVENOUS

## 2020-01-08 MED ORDER — SUGAMMADEX SODIUM 200 MG/2ML IV SOLN
INTRAVENOUS | Status: DC | PRN
  Administered 2020-01-08: 200 mg via INTRAVENOUS

## 2020-01-08 MED ORDER — PHENYLEPHRINE HCL-NACL 10-0.9 MG/250ML-% IV SOLN
INTRAVENOUS | Status: DC | PRN
  Administered 2020-01-08: 25 ug/min via INTRAVENOUS

## 2020-01-08 MED ORDER — LIDOCAINE-EPINEPHRINE 1 %-1:100000 IJ SOLN
INTRAMUSCULAR | Status: DC | PRN
  Administered 2020-01-08: 5 mL

## 2020-01-08 MED ORDER — OXYCODONE HCL 5 MG/5ML PO SOLN: 5.0000 mg | Freq: Once | ORAL | Status: DC | PRN

## 2020-01-08 MED ORDER — ROCURONIUM BROMIDE 10 MG/ML (PF) SYRINGE
PREFILLED_SYRINGE | INTRAVENOUS | Status: AC
  Filled 2020-01-08: qty 10

## 2020-01-08 MED ORDER — PHENYLEPHRINE 40 MCG/ML (10ML) SYRINGE FOR IV PUSH (FOR BLOOD PRESSURE SUPPORT)
PREFILLED_SYRINGE | INTRAVENOUS | Status: AC
  Filled 2020-01-08: qty 10

## 2020-01-08 MED ORDER — ATORVASTATIN CALCIUM 10 MG PO TABS
20.0000 mg | ORAL_TABLET | Freq: Every day | ORAL | Status: DC
  Administered 2020-01-09: 20 mg via ORAL
  Filled 2020-01-08: qty 2

## 2020-01-08 MED ORDER — PANTOPRAZOLE SODIUM 40 MG PO TBEC
40.0000 mg | DELAYED_RELEASE_TABLET | Freq: Every day | ORAL | Status: DC
  Administered 2020-01-09: 40 mg via ORAL
  Filled 2020-01-08: qty 1

## 2020-01-08 MED ORDER — ONDANSETRON HCL 4 MG/2ML IJ SOLN
INTRAMUSCULAR | Status: DC | PRN
  Administered 2020-01-08: 4 mg via INTRAVENOUS

## 2020-01-08 MED ORDER — PROPOFOL 10 MG/ML IV BOLUS
INTRAVENOUS | Status: AC
  Filled 2020-01-08: qty 20

## 2020-01-08 MED ORDER — CHLORHEXIDINE GLUCONATE 0.12 % MT SOLN
15.0000 mL | Freq: Once | OROMUCOSAL | Status: AC
  Administered 2020-01-08: 15 mL via OROMUCOSAL
  Filled 2020-01-08: qty 15

## 2020-01-08 MED ORDER — CHLORHEXIDINE GLUCONATE CLOTH 2 % EX PADS: 6.0000 | MEDICATED_PAD | Freq: Once | CUTANEOUS | Status: DC

## 2020-01-08 MED ORDER — CEFAZOLIN SODIUM-DEXTROSE 2-4 GM/100ML-% IV SOLN
2.0000 g | INTRAVENOUS | Status: AC
  Filled 2020-01-08: qty 100

## 2020-01-08 MED ORDER — MELATONIN 3 MG PO TABS: 1.5000 mg | ORAL_TABLET | Freq: Every evening | ORAL | Status: DC | PRN

## 2020-01-08 MED ORDER — FENTANYL CITRATE (PF) 250 MCG/5ML IJ SOLN
INTRAMUSCULAR | Status: AC
  Filled 2020-01-08: qty 5

## 2020-01-08 MED ORDER — METOPROLOL SUCCINATE ER 25 MG PO TB24
25.0000 mg | ORAL_TABLET | Freq: Two times a day (BID) | ORAL | Status: DC
  Administered 2020-01-08 – 2020-01-09 (×2): 25 mg via ORAL
  Filled 2020-01-08 (×2): qty 1

## 2020-01-08 MED ORDER — OXYCODONE HCL 5 MG PO TABS: 5.0000 mg | ORAL_TABLET | Freq: Once | ORAL | Status: DC | PRN

## 2020-01-08 MED ORDER — PROPOFOL 10 MG/ML IV BOLUS
INTRAVENOUS | Status: DC | PRN
  Administered 2020-01-08: 100 mg via INTRAVENOUS

## 2020-01-08 MED ORDER — LABETALOL HCL 5 MG/ML IV SOLN
10.0000 mg | INTRAVENOUS | Status: DC | PRN
  Filled 2020-01-08: qty 8

## 2020-01-08 MED ORDER — POLYETHYLENE GLYCOL 3350 17 G PO PACK: 17.0000 g | PACK | Freq: Every day | ORAL | Status: DC | PRN

## 2020-01-08 MED ORDER — BACITRACIN ZINC 500 UNIT/GM EX OINT
TOPICAL_OINTMENT | CUTANEOUS | Status: DC | PRN
  Administered 2020-01-08: 1 via TOPICAL

## 2020-01-08 MED ORDER — FENTANYL CITRATE (PF) 100 MCG/2ML IJ SOLN
INTRAMUSCULAR | Status: DC | PRN
Start: 2020-01-08 — End: 2020-01-08
  Administered 2020-01-08 (×4): 50 ug via INTRAVENOUS

## 2020-01-08 MED ORDER — ONDANSETRON HCL 4 MG PO TABS: 4.0000 mg | ORAL_TABLET | ORAL | Status: DC | PRN

## 2020-01-08 MED ORDER — CEFAZOLIN SODIUM-DEXTROSE 2-4 GM/100ML-% IV SOLN
2.0000 g | Freq: Three times a day (TID) | INTRAVENOUS | Status: AC
  Administered 2020-01-08 – 2020-01-09 (×2): 2 g via INTRAVENOUS
  Filled 2020-01-08 (×2): qty 100

## 2020-01-08 MED ORDER — 0.9 % SODIUM CHLORIDE (POUR BTL) OPTIME
TOPICAL | Status: DC | PRN
  Administered 2020-01-08: 1000 mL

## 2020-01-08 MED ORDER — SODIUM CHLORIDE 0.9 % IV SOLN: INTRAVENOUS | Status: DC

## 2020-01-08 MED ORDER — ACETAMINOPHEN 500 MG PO TABS
1000.0000 mg | ORAL_TABLET | Freq: Once | ORAL | Status: DC
  Filled 2020-01-08: qty 2

## 2020-01-08 MED ORDER — LIDOCAINE-EPINEPHRINE 1 %-1:100000 IJ SOLN
INTRAMUSCULAR | Status: DC | PRN
  Administered 2020-01-08: 2 mL

## 2020-01-08 MED ORDER — ACETAMINOPHEN 325 MG PO TABS
650.0000 mg | ORAL_TABLET | ORAL | Status: DC | PRN
  Administered 2020-01-09: 650 mg via ORAL
  Filled 2020-01-08: qty 2

## 2020-01-08 MED ORDER — HYDROCODONE-ACETAMINOPHEN 5-325 MG PO TABS
1.0000 | ORAL_TABLET | ORAL | Status: DC | PRN
  Administered 2020-01-08 (×2): 1 via ORAL
  Filled 2020-01-08 (×2): qty 1

## 2020-01-08 SURGICAL SUPPLY — 89 items
BLADE CLIPPER SURG (BLADE) ×4 IMPLANT
BLADE SURG 11 STRL SS (BLADE) ×8 IMPLANT
BOOT SUTURE AID YELLOW STND (SUTURE) IMPLANT
BUR PRECISION FLUTE 5.0 (BURR) ×4 IMPLANT
CANISTER SUCT 3000ML PPV (MISCELLANEOUS) ×4 IMPLANT
CARTRIDGE OIL MAESTRO DRILL (MISCELLANEOUS) ×2 IMPLANT
CATH VENTRICULAR 7CM (Shunt) ×4 IMPLANT
CLIP RANEY DISP (INSTRUMENTS) IMPLANT
CLOSURE WOUND 1/2 X4 (GAUZE/BANDAGES/DRESSINGS)
CONNECTOR SHUNT 1.2MMX1.8MM (MISCELLANEOUS) ×4 IMPLANT
COVER WAND RF STERILE (DRAPES) ×8 IMPLANT
DECANTER SPIKE VIAL GLASS SM (MISCELLANEOUS) ×8 IMPLANT
DERMABOND ADHESIVE PROPEN (GAUZE/BANDAGES/DRESSINGS) ×4
DERMABOND ADVANCED (GAUZE/BANDAGES/DRESSINGS) ×2
DERMABOND ADVANCED .7 DNX12 (GAUZE/BANDAGES/DRESSINGS) ×2 IMPLANT
DERMABOND ADVANCED .7 DNX6 (GAUZE/BANDAGES/DRESSINGS) ×4 IMPLANT
DIFFUSER DRILL AIR PNEUMATIC (MISCELLANEOUS) ×4 IMPLANT
DRAPE HALF SHEET 40X57 (DRAPES) ×4 IMPLANT
DRAPE INCISE IOBAN 66X45 STRL (DRAPES) ×4 IMPLANT
DRAPE ORTHO SPLIT 77X108 STRL (DRAPES) ×4
DRAPE SURG ORHT 6 SPLT 77X108 (DRAPES) ×4 IMPLANT
DRSG OPSITE POSTOP 3X4 (GAUZE/BANDAGES/DRESSINGS) ×8 IMPLANT
DURAPREP 26ML APPLICATOR (WOUND CARE) ×8 IMPLANT
ELECT COATED BLADE 2.86 ST (ELECTRODE) ×4 IMPLANT
ELECT REM PT RETURN 9FT ADLT (ELECTROSURGICAL) ×4
ELECTRODE REM PT RTRN 9FT ADLT (ELECTROSURGICAL) ×2 IMPLANT
GAUZE 4X4 16PLY RFD (DISPOSABLE) IMPLANT
GLOVE BIO SURGEON STRL SZ 6 (GLOVE) ×4 IMPLANT
GLOVE BIO SURGEON STRL SZ 6.5 (GLOVE) ×6 IMPLANT
GLOVE BIO SURGEON STRL SZ7.5 (GLOVE) IMPLANT
GLOVE BIO SURGEON STRL SZ8 (GLOVE) ×4 IMPLANT
GLOVE BIO SURGEONS STRL SZ 6.5 (GLOVE) ×2
GLOVE BIOGEL PI IND STRL 6.5 (GLOVE) ×2 IMPLANT
GLOVE BIOGEL PI IND STRL 7.5 (GLOVE) ×6 IMPLANT
GLOVE BIOGEL PI IND STRL 8 (GLOVE) ×6 IMPLANT
GLOVE BIOGEL PI INDICATOR 6.5 (GLOVE) ×2
GLOVE BIOGEL PI INDICATOR 7.5 (GLOVE) ×6
GLOVE BIOGEL PI INDICATOR 8 (GLOVE) ×6
GLOVE ECLIPSE 7.0 STRL STRAW (GLOVE) ×12 IMPLANT
GLOVE ECLIPSE 7.5 STRL STRAW (GLOVE) ×4 IMPLANT
GLOVE EXAM NITRILE XL STR (GLOVE) IMPLANT
GLOVE INDICATOR 6.5 STRL GRN (GLOVE) ×8 IMPLANT
GOWN STRL REUS W/ TWL LRG LVL3 (GOWN DISPOSABLE) ×8 IMPLANT
GOWN STRL REUS W/ TWL XL LVL3 (GOWN DISPOSABLE) IMPLANT
GOWN STRL REUS W/TWL 2XL LVL3 (GOWN DISPOSABLE) ×4 IMPLANT
GOWN STRL REUS W/TWL LRG LVL3 (GOWN DISPOSABLE) ×8
GOWN STRL REUS W/TWL XL LVL3 (GOWN DISPOSABLE)
HEMOSTAT POWDER SURGIFOAM 1G (HEMOSTASIS) ×4 IMPLANT
HEMOSTAT SURGICEL 2X14 (HEMOSTASIS) IMPLANT
KIT BASIN OR (CUSTOM PROCEDURE TRAY) ×8 IMPLANT
KIT TURNOVER KIT B (KITS) ×4 IMPLANT
MARKER SKIN DUAL TIP RULER LAB (MISCELLANEOUS) ×8 IMPLANT
NEEDLE HYPO 22GX1.5 SAFETY (NEEDLE) ×4 IMPLANT
NEEDLE HYPO 25X1 1.5 SAFETY (NEEDLE) ×4 IMPLANT
NS IRRIG 1000ML POUR BTL (IV SOLUTION) ×4 IMPLANT
OIL CARTRIDGE MAESTRO DRILL (MISCELLANEOUS) ×4
PACK LAMINECTOMY NEURO (CUSTOM PROCEDURE TRAY) ×4 IMPLANT
PAD ARMBOARD 7.5X6 YLW CONV (MISCELLANEOUS) ×12 IMPLANT
PASSER CATH 65CM DISP (NEUROSURGERY SUPPLIES) ×4 IMPLANT
SET SHEATH INTRODUCER 10FR (MISCELLANEOUS) ×4 IMPLANT
SET TUBE SMOKE EVAC HIGH FLOW (TUBING) ×4 IMPLANT
SHEATH COOK PEEL AWAY SET 9F (SHEATH) ×4 IMPLANT
SHEATH PERITONEAL INTRO 61 (SHEATH) ×4 IMPLANT
SHUNT STRATA 11 SNAP REG (Shunt) ×4 IMPLANT
SLEEVE ENDOPATH XCEL 5M (ENDOMECHANICALS) ×8 IMPLANT
SPONGE LAP 4X18 RFD (DISPOSABLE) IMPLANT
SPONGE SURGIFOAM ABS GEL SZ50 (HEMOSTASIS) IMPLANT
STAPLER SKIN PROX WIDE 3.9 (STAPLE) ×4 IMPLANT
STRIP CLOSURE SKIN 1/2X4 (GAUZE/BANDAGES/DRESSINGS) IMPLANT
SUT ETHILON 3 0 PS 1 (SUTURE) IMPLANT
SUT MNCRL AB 4-0 PS2 18 (SUTURE) ×4 IMPLANT
SUT NURALON 4 0 TR CR/8 (SUTURE) ×4 IMPLANT
SUT SILK 0 TIES 10X30 (SUTURE) ×4 IMPLANT
SUT SILK 3 0 SH 30 (SUTURE) IMPLANT
SUT VIC AB 3-0 SH 27 (SUTURE) ×2
SUT VIC AB 3-0 SH 27X BRD (SUTURE) ×2 IMPLANT
SUT VIC AB 3-0 SH 8-18 (SUTURE) ×8 IMPLANT
SUT VIC AB 4-0 PS2 27 (SUTURE) ×4 IMPLANT
SUT VICRYL 0 UR6 27IN ABS (SUTURE) ×4 IMPLANT
SYR BULB IRRIG 60ML STRL (SYRINGE) ×4 IMPLANT
SYR CONTROL 10ML LL (SYRINGE) ×4 IMPLANT
TOWEL GREEN STERILE (TOWEL DISPOSABLE) ×8 IMPLANT
TOWEL GREEN STERILE FF (TOWEL DISPOSABLE) ×4 IMPLANT
TROCAR XCEL BLUNT TIP 100MML (ENDOMECHANICALS) IMPLANT
TROCAR XCEL NON-BLD 5MMX100MML (ENDOMECHANICALS) ×8 IMPLANT
TUBE CONNECTING 12'X1/4 (SUCTIONS) ×1
TUBE CONNECTING 12X1/4 (SUCTIONS) ×3 IMPLANT
UNDERPAD 30X36 HEAVY ABSORB (UNDERPADS AND DIAPERS) IMPLANT
WATER STERILE IRR 1000ML POUR (IV SOLUTION) ×4 IMPLANT

## 2020-01-08 NOTE — H&P (Signed)
Adam Juarez is an 79 y.o. male.   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  VP shunt planned by Dr. Kathyrn Sheriff.  I am asked to assist with laparoscopic portion.    Past Medical History:  Diagnosis Date  . Arthritis    fingers  . 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 Chicken pox   . Dysrhythmia 09/2013   Hx. a-fib x 1 episode patient states he "auto corrected" seen at [facility]  . GERD (gastroesophageal reflux disease)   . Headache    poor posture - none recently  . History of kidney stones   . PSVT (paroxysmal supraventricular tachycardia) ([place]) 2009   Controlled with "breathing process"  . Renal stone 9/08, 6/15  . Wears dentures    full upper    Past Surgical History:  Procedure Laterality Date  . CARDIAC CATHETERIZATION  [address] [place]  . CATARACT EXTRACTION W/PHACO Left 07/27/2015   Procedure: CATARACT EXTRACTION PHACO AND INTRAOCULAR LENS PLACEMENT (IOC);  Surgeon: Leandrew Koyanagi, MD;  Location: [facility];  Service: Ophthalmology;  Laterality: Left;  TORIC  . CATARACT EXTRACTION W/PHACO Right 08/24/2015   Procedure: CATARACT EXTRACTION PHACO AND INTRAOCULAR LENS PLACEMENT (IOC);  Surgeon: Leandrew Koyanagi, MD;  Location: [facility];  Service: Ophthalmology;  Laterality: Right;  TORIC  . HEMORROIDECTOMY  2010   banding  . IR ANGIO INTRA EXTRACRAN SEL COM CAROTID INNOMINATE BILAT MOD SED  10/30/2019  . IR ANGIO VERTEBRAL SEL VERTEBRAL UNI R MOD SED  10/30/2019  . IR US GUIDE VASC ACCESS RIGHT  10/30/2019  . KIDNEY STONE SURGERY  9/08, 6/15   lithotripsy  . PROSTATE BIOPSY  2007  . SKIN CANCER EXCISION    . TOENAIL TRIMMING  11/2017   Dr. Elvina Mattes  . TONSILLECTOMY    . VASECTOMY      Family History  Problem Relation Age of Onset  . AAA (abdominal aortic aneurysm) Father   . Parkinson's disease Mother   . Arthritis Mother    Social History:  reports that he quit smoking about  22 years ago. His smoking use included cigarettes. He has a 19.00 pack-year smoking history. He has never used smokeless tobacco. He reports that he does not drink alcohol and does not use drugs.  Allergies:  Allergies  Allergen Reactions  . Bee Venom Anaphylaxis and Hives  . Other     Pistachios - tickles throat     Medications Prior to Admission  Medication Sig Dispense Refill  . acetaminophen (TYLENOL) 500 MG tablet Take 1,000 mg by mouth every 8 (eight) hours as needed for moderate pain.    Marland Kitchen ascorbic acid (VITAMIN C) 500 MG tablet Take 500 mg by mouth daily.    Marland Kitchen atorvastatin (LIPITOR) 20 MG tablet TAKE 1 TABLET DAILY FOR CHOLESTEROL 90 tablet 3  . Magnesium Cl-Calcium Carbonate (SLOW-MAG PO) Take 1 tablet by mouth daily.     . Melatonin 1 MG CAPS Take 1 mg by mouth at bedtime as needed (sleep).    . metoprolol succinate (TOPROL-XL) 25 MG 24 hr tablet TAKE 1 TABLET IN THE MORNING AND AT BEDTIME (Patient taking differently: Take 25 mg by mouth 2 (two) times daily. ) 180 tablet 2  . Multiple Vitamins-Minerals (MULTIVITAMIN WITH MINERALS) tablet Take 1 tablet by mouth daily.    . Multiple Vitamins-Minerals (PRESERVISION AREDS 2 PO) Take 1 capsule by mouth 2 (two) times  daily.     . omeprazole (PRILOSEC) 20 MG capsule TAKE 1 CAPSULE DAILY (Patient taking differently: Take 20 mg by mouth daily. ) 90 capsule 1  . Polyethyl Glycol-Propyl Glycol (SYSTANE OP) Apply 1 drop to eye 3 (three) times daily as needed (dry eyes).     . Potassium Gluconate 550 MG TABS Take 550 mg by mouth daily.    . Probiotic Product (PROBIOTIC DAILY PO) Take 1 capsule by mouth daily.     . vitamin B-12 (CYANOCOBALAMIN) 1000 MCG tablet Take 1,000 mcg by mouth daily.    Alveda Reasons 20 MG TABS tablet TAKE 1 TABLET DAILY WITH SUPPER (Patient taking differently: Take 20 mg by mouth daily with supper. ) 90 tablet 3  . diclofenac Sodium (VOLTAREN) 1 % GEL Apply 1 application topically 4 (four) times daily as needed (pain).     Marland Kitchen EPINEPHrine (EPIPEN 2-PAK) 0.3 mg/0.3 mL IJ SOAJ injection Inject 0.3 mLs (0.3 mg total) into the muscle as needed for anaphylaxis. 2 each 0    No results found for this or any previous visit (from the past 48 hour(s)). No results found.  Review of Systems  All other systems reviewed and are negative.   Blood pressure (!) 152/87, pulse 64, temperature 97.7 F (36.5 C), temperature source Oral, resp. rate 20, height 5\' 10"  (1.778 m), weight 93.2 kg, SpO2 96 %. Physical Exam Constitutional:      Appearance: He is normal weight.  HENT:     Head: Normocephalic and atraumatic.  Cardiovascular:     Rate and Rhythm: Normal rate and regular rhythm.  Pulmonary:     Effort: Pulmonary effort is normal.  Chest:     Chest wall: No tenderness.  Abdominal:     General: Abdomen is flat.     Palpations: Abdomen is soft.     Comments: Tiny umbilical hernia  Musculoskeletal:     Cervical back: Normal range of motion and neck supple.  Skin:    General: Skin is warm and dry.  Neurological:     General: No focal deficit present.     Mental Status: He is alert and oriented to person, place, and time.  Psychiatric:        Mood and Affect: Mood normal.        Behavior: Behavior normal.        Thought Content: Thought content normal.        Judgment: Judgment normal.      Assessment/Plan Hydrocephalus  VP shunt by Dr. Kathyrn Sheriff. Discussed laparoscopic portion with patient including risks.    Stark Klein, MD 01/08/2020, 8:01 AM

## 2020-01-08 NOTE — Progress Notes (Signed)
Pt arrived to 4NP10 from PACU. Pt A&Ox4 and vitals taken and WNL. Pt states pain is dull and achy 3/10 around surgical sites on head, does not want pain medication at this time. Dressings c/d/i. No complaints of n/v, visual changes, dizziness, etc. Will continue to monitor.   Justice Rocher, RN

## 2020-01-08 NOTE — Op Note (Signed)
PRE-OPERATIVE DIAGNOSIS: normal pressure hydrocephalus  POST-OPERATIVE DIAGNOSIS:  Same  PROCEDURE:  Procedure(s): Laparoscopic assisted VP shunt placement  SURGEON:  Surgeon(s): Stark Klein, MD  ANESTHESIA:   general  DRAINS: Ventriculostomy Drain in the peritoneal cavity.     LOCAL MEDICATIONS USED:  BUPIVICAINE  and LIDOCAINE   SPECIMEN:  No Specimen  DISPOSITION OF SPECIMEN:  N/A  COUNTS:  YES  DICTATION: .Dragon Dictation  PLAN OF CARE: Admit for overnight observation  PATIENT DISPOSITION:  PACU - hemodynamically stable.  FINDINGS:  Catheter freely in the abdomen/pelvis. Tip of catheter open with drainage visible  EBL: min  PROCEDURE:  Patient was identified in the holding area and taken the operating room where he was placed supine on operating room table.  General anesthesia was induced.  The right scalp, neck, bilateral chest and abdomen were prepped and draped in sterile fashion.  A timeout was performed according to the surgical safety checklist.  When all was correct, we continued.  Dr. Kathyrn Sheriff performed the clinical portion of the VP shunt on Past the tunneler along with the tunneler sheath along the right neck and right chest.  An exit site was identified in the right upper abdomen.  A counterincision was made at the tip of the tunneler and the tunneler was removed.  The ventriculoperitoneal catheter was advanced through the tunneler sheath from the cranial caudal direction.  The sheath was then removed from the abdominal counterincision.    The patient was then placed into reverse Trendelenburg position and rotated to the right.  A 5 mm Optiview trocar was placed at the left costal margin after administration of local.  This was done under direct visualization.  A second port was then placed in the right upper quadrant at the catheter exit site.  The catheter was then advanced abdominal wall with a hemostat.  This was able to be inserted into the abdomen but  breakage was noted in the catheter.  Dr. Kathyrn Sheriff placed a catheter connector and secured it with 4-0 Nurolon on both sides.  A 10 French pull away sheath was then obtained.  The needle was advanced to the abdominal wall followed by a guidewire.  The tunnelers sheath with dilator was advanced over the wire and the dilator was removed.  The catheter was then advanced through the sheath.  The sheath was then removed.  The glassman grasper was then used to place the catheter gently and a curved pounding along the pelvis.  The tip of the catheter originally did not show any output.  The connector site was examined at the abdominal wall to make sure that it was not kinked.  At this point, the tip did show good output from the shunt.  The ports were then removed and then the pneumoperitoneum was allowed to evacuate.  The right upper quadrant exit site was closed with a 3 oh deep dermal suture and 4-0 Monocryl running subcuticular suture to better protect the catheter.  The left subcostal port was closed with closed with a single 4-0 Monocryl subcuticular stitch.  The wounds were then cleaned, dried, and dressed with Dermabond.  The patient was allowed to emerge from anesthesia and taken to the PACU in stable condition.  Needle, sponge, instrument counts were correct x2.

## 2020-01-08 NOTE — Evaluation (Signed)
Physical Therapy Evaluation Patient Details Name: Adam Juarez MRN: [id number] DOB: [birth year] Today's Date: 01/08/2020   History of Present Illness  79 y.o. 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 As part of work up and MRI brain was obtained which revealed supratentorial ventriculomegaly. He was subsequently referred for high volume LP which improved his symptoms.  It was therefore recommended he undergo placement of VP shunt. Pt underwent VP shunt placement on 01/08/2020.  Clinical Impression  Pt presents to PT with deficits in gait, cognition, balance. Pt with some R foot drag noted but does not require physical assistance for any mobility this session. Pt does demonstrate impaired short term memory and orientation with difficulty accurately identifying the year and forgetting this PTs name 3 different times during the session. Pt will benefit from continued acute PT POC to continue to progress gait training and to aide in a return to independence. PT currently recommends outpatient PT for gait and balance training.    Follow Up Recommendations Outpatient PT;Supervision - Intermittent    Equipment Recommendations  None recommended by PT    Recommendations for Other Services       Precautions / Restrictions Precautions Precautions: Fall Restrictions Weight Bearing Restrictions: No      Mobility  Bed Mobility Overal bed mobility: Needs Assistance Bed Mobility: Supine to Sit     Supine to sit: Supervision;HOB elevated        Transfers Overall transfer level: Needs assistance Equipment used: None Transfers: Sit to/from Stand Sit to Stand: Supervision            Ambulation/Gait Ambulation/Gait assistance: Supervision Gait Distance (Feet): 150 Feet Assistive device: None Gait Pattern/deviations: Step-through  pattern;Decreased dorsiflexion - right Gait velocity: reduced Gait velocity interpretation: 1.31 - 2.62 ft/sec, indicative of limited community ambulator General Gait Details: pt noted to have some R foot drag during ambulation, no losses of balance noted  Stairs            Wheelchair Mobility    Modified Rankin (Stroke Patients Only)       Balance Overall balance assessment: Needs assistance Sitting-balance support: No upper extremity supported;Feet supported Sitting balance-Leahy Scale: Good     Standing balance support: No upper extremity supported Standing balance-Leahy Scale: Good Standing balance comment: supervision for toileting                             Pertinent Vitals/Pain Pain Assessment: Faces Faces Pain Scale: Hurts little more Pain Location: head Pain Descriptors / Indicators: Aching Pain Intervention(s): Monitored during session    Home Living Family/patient expects to be discharged to:: Private residence Living Arrangements: Spouse/significant other Available Help at Discharge: Family;Available 24 hours/day Type of Home: House Home Access: Level entry     Home Layout: One level Home Equipment: Environmental consultant - 2 wheels (wife uses RW)      Prior Function Level of Independence: Independent               Hand Dominance        Extremity/Trunk Assessment   Upper Extremity Assessment Upper Extremity Assessment: Overall WFL for tasks assessed    Lower Extremity Assessment Lower Extremity Assessment: Generalized weakness    Cervical / Trunk Assessment Cervical / Trunk Assessment: Normal  Communication   Communication: No difficulties  Cognition Arousal/Alertness: Awake/alert Behavior During Therapy: WFL for tasks  assessed/performed Overall Cognitive Status: Impaired/Different from baseline Area of Impairment: Orientation;Memory;Problem solving                 Orientation Level: Disoriented to;Time (pt requires cues  for date, initially reports august 17th)   Memory: Decreased short-term memory                General Comments General comments (skin integrity, edema, etc.): VSS on RA    Exercises     Assessment/Plan    PT Assessment Patient needs continued PT services  PT Problem List Decreased strength;Decreased balance;Decreased mobility;Decreased knowledge of precautions;Decreased safety awareness;Pain       PT Treatment Interventions DME instruction;Gait training;Functional mobility training;Therapeutic activities;Balance training;Neuromuscular re-education;Patient/family education;Cognitive remediation    PT Goals (Current goals can be found in the Care Plan section)  Acute Rehab PT Goals Patient Stated Goal: To return to independence PT Goal Formulation: With patient Time For Goal Achievement: 01/22/20 Potential to Achieve Goals: Good Additional Goals Additional Goal #1: Pt will score >19/24 on DGI to indicate a low falls risk    Frequency Min 3X/week   Barriers to discharge        Co-evaluation               AM-PAC PT "6 Clicks" Mobility  Outcome Measure Help needed turning from your back to your side while in a flat bed without using bedrails?: None Help needed moving from lying on your back to sitting on the side of a flat bed without using bedrails?: None Help needed moving to and from a bed to a chair (including a wheelchair)?: None Help needed standing up from a chair using your arms (e.g., wheelchair or bedside chair)?: None Help needed to walk in hospital room?: None Help needed climbing 3-5 steps with a railing? : A Little 6 Click Score: 23    End of Session   Activity Tolerance: Patient tolerated treatment well Patient left: in chair;with call bell/phone within reach Nurse Communication: Mobility status PT Visit Diagnosis: Other abnormalities of gait and mobility (R26.89);Muscle weakness (generalized) (M62.81)    Time: 5053-9767 PT Time Calculation  (min) (ACUTE ONLY): 24 min   Charges:   PT Evaluation $PT Eval Moderate Complexity: 1 Mod          Zenaida Niece, PT, DPT Acute Rehabilitation Pager: [phone]   Zenaida Niece 01/08/2020, 5:03 PM

## 2020-01-08 NOTE — Transfer of Care (Signed)
Immediate Anesthesia Transfer of Care Note  Patient: Winston Misner  Procedure(s) Performed: SHUNT INSERTION VENTRICULAR-PERITONEAL (Right ) LAPAROSCOPIC REVISION VENTRICULAR-PERITONEAL (V-P) SHUNT  Patient Location: PACU  Anesthesia Type:General  Level of Consciousness: awake, alert  and sedated  Airway & Oxygen Therapy: Patient connected to face mask oxygen  Post-op Assessment: Post -op Vital signs reviewed and stable  Post vital signs: stable  Last Vitals:  Vitals Value Taken Time  BP    Temp    Pulse 70 01/08/20 1029  Resp 13 01/08/20 1029  SpO2 83 % 01/08/20 1029  Vitals shown include unvalidated device data.  Last Pain:  Vitals:   01/08/20 0718  TempSrc:   PainSc: 0-No pain      Patients Stated Pain Goal: 3 (21/22/48 2500)  Complications: No complications documented.

## 2020-01-08 NOTE — Progress Notes (Signed)
Pt previously A&Ox4, now is unable to state what year it is. Says it's 2007, 2017, and then 2027. Does know current president, and appears to have no other changes or concerns. Message left with [facility] to make MD aware of change.   Justice Rocher, RN

## 2020-01-08 NOTE — Anesthesia Postprocedure Evaluation (Signed)
Anesthesia Post Note  Patient: Adam Juarez  Procedure(s) Performed: SHUNT INSERTION VENTRICULAR-PERITONEAL (Right ) LAPAROSCOPIC REVISION VENTRICULAR-PERITONEAL (V-P) SHUNT     Patient location during evaluation: PACU Anesthesia Type: General Level of consciousness: awake and alert, patient cooperative and oriented Pain management: pain level controlled Vital Signs Assessment: post-procedure vital signs reviewed and stable Respiratory status: spontaneous breathing, nonlabored ventilation and respiratory function stable Cardiovascular status: blood pressure returned to baseline and stable Postop Assessment: no apparent nausea or vomiting Anesthetic complications: no   No complications documented.  Last Vitals:  Vitals:   01/08/20 1114 01/08/20 1145  BP: 136/85 (!) 147/87  Pulse: 65 66  Resp: 15 17  Temp:  36.7 C  SpO2: 94% 93%    Last Pain:  Vitals:   01/08/20 1145  TempSrc:   PainSc: 3                  Katalena Malveaux,E. Dasja Brase

## 2020-01-08 NOTE — Anesthesia Procedure Notes (Signed)
Procedure Name: Intubation Date/Time: 01/08/2020 8:56 AM Performed by: Lavell Luster, CRNA Pre-anesthesia Checklist: Patient identified, Emergency Drugs available, Suction available, Patient being monitored and Timeout performed Patient Re-evaluated:Patient Re-evaluated prior to induction Oxygen Delivery Method: Circle system utilized Preoxygenation: Pre-oxygenation with 100% oxygen Induction Type: IV induction Ventilation: Mask ventilation without difficulty Laryngoscope Size: Mac and 4 Grade View: Grade I Tube type: Oral Tube size: 7.5 mm Number of attempts: 1 Placement Confirmation: ETT inserted through vocal cords under direct vision,  positive ETCO2 and breath sounds checked- equal and bilateral Secured at: 22 cm Tube secured with: Tape Dental Injury: Teeth and Oropharynx as per pre-operative assessment

## 2020-01-08 NOTE — Op Note (Signed)
  NEUROSURGERY OPERATIVE NOTE   PREOP DIAGNOSIS: Normal Pressure Hydrocephalus  POSTOP DIAGNOSIS: Same  PROCEDURE: 1. Laparoscopic Assisted right frontal ventriculoperitoneal shunt placement  SURGEON: Dr. Consuella Lose, MD  CO-SURGEON: Dr. Stark Klein  ANESTHESIA: General Endotracheal  EBL: 100cc  SPECIMENS: None  DRAINS: None  COMPLICATIONS: None immediate  CONDITION: Stable to ICU  SHUNT PLACED: Medtronic Strata II, set to 1.5  HISTORY: Adam Juarez is a 79 y.o. male initially seen in the outpatient neurosurgery clinic as a referral for normal pressure hydrocephalus.  Clinically, he did have complaints of gait instability, urinary incontinence, as well as some vague memory disturbance.  He did undergo high-volume lumbar puncture with pre and postoperative therapy evaluations which revealed subtle improvement in his symptoms.  After lengthy discussion of treatment options the patient elected to proceed with placement of a ventriculoperitoneal shunt.  The risks, benefits, and alternatives were all reviewed in detail with the patient and his wife.  After all questions were answered informed consent was obtained and witnessed.  PROCEDURE IN DETAIL: The patient was brought to the operating room and transferred to the operative table. After induction of general anesthesia, the patient was positioned on the operative table in the supine position with all pressure points meticulously padded. The skin of the scalp,  neck, chest, and abdomen were prepped in the usual sterile fashion.  After timeout was conducted, a semilunar skin incision over the right frontal Kocher's point was infiltrated with local anesthetic with epinephrine.  Incision was then made sharply and carried down through the galea.  Raney clips were applied for hemostasis.  The periosteum was then incised and elevated.  Using a forcep, a subcutaneous tunnel was created towards the retroauricular region.  Small  counterincision was made and the shunt apparatus was passed with the valve seated subcutaneously in the right frontoparietal region.  Utilizing the shunt passer, a subcutaneous pass was made from the retroauricular incision down to the right upper quadrant.  The distal shunt catheter was then passed.  High-speed drill was then used to create a bur hole over Kocher's point.  The dura was coagulated and the pia was coagulated.  A 7 cm ventricular catheter was then passed using standard anatomic landmarks into the right frontal horn.  Good brisk flow of clear CSF was obtained.  The ventricular catheter was then connected to the valve assembly.  The distal portion of the shunt was then placed in the intraperitoneal space under laparoscopic guidance by Dr. Barry Dienes.  The details of this procedure are dictated in a separate report.  During placement of the laparoscopic ports, the distal portion of the catheter as it entered the counterincision in the right upper quadrant was sheared.  A type a connector was therefore used to connect the 2 ends of the distal catheter and secured with 4-0 Nurolon.  Once the distal portion of the catheter was placed into the pelvis, it was observed to be flowing briskly with pumping of the valve.  At this point the scalp wounds were irrigated with copious amounts of bacitracin irrigation, and closed using 3-0 Vicryl stitches.  The skin was then closed using standard surgical skin staples.  Sterile dressings were then applied.  At the end of the case all sponge needle and instrument counts were correct.  The patient tolerated the procedure well and was extubated in the room and taken to the postanesthesia care unit in stable condition.

## 2020-01-09 LAB — CBC
HCT: 41.2 % (ref 39.0–52.0)
Hemoglobin: 14.2 g/dL (ref 13.0–17.0)
MCH: 33.5 pg (ref 26.0–34.0)
MCHC: 34.5 g/dL (ref 30.0–36.0)
MCV: 97.2 fL (ref 80.0–100.0)
Platelets: 124 10*3/uL — ABNORMAL LOW (ref 150–400)
RBC: 4.24 MIL/uL (ref 4.22–5.81)
RDW: 13.1 % (ref 11.5–15.5)
WBC: 9.2 10*3/uL (ref 4.0–10.5)
nRBC: 0 % (ref 0.0–0.2)

## 2020-01-09 LAB — BASIC METABOLIC PANEL
Anion gap: 13 (ref 5–15)
BUN: 14 mg/dL (ref 8–23)
CO2: 23 mmol/L (ref 22–32)
Calcium: 9.5 mg/dL (ref 8.9–10.3)
Chloride: 102 mmol/L (ref 98–111)
Creatinine, Ser: 0.89 mg/dL (ref 0.61–1.24)
GFR calc Af Amer: >60 mL/min (ref >60)
GFR calc non Af Amer: >60 mL/min (ref >60)
Glucose, Bld: 150 mg/dL — ABNORMAL HIGH (ref 70–99)
Potassium: 4 mmol/L (ref 3.5–5.1)
Sodium: 138 mmol/L (ref 135–145)

## 2020-01-09 MED ORDER — HYDROCODONE-ACETAMINOPHEN 5-325 MG PO TABS
1.0000 | ORAL_TABLET | ORAL | 0 refills | Status: DC | PRN
Start: 2020-01-09 — End: 2020-07-21

## 2020-01-09 NOTE — Progress Notes (Signed)
Discharge instructions reviewed with pt and his wife.  Copy of instructions given to pt, 1 script sent in to pt's pharmacy by MD, pt informed.  Pt d/c'd via wheelchair with belongings, with wife.             Escorted by unit staff.

## 2020-01-09 NOTE — Progress Notes (Addendum)
Physical Therapy Treatment Patient Details Name: Adam Juarez MRN: [id number] DOB: 11/15/1940 Today's Date: 01/09/2020    History of Present Illness 79 y.o. 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 As part of work up and MRI brain was obtained which revealed supratentorial ventriculomegaly. He was subsequently referred for high volume LP which improved his symptoms.  It was therefore recommended he undergo placement of VP shunt. Pt underwent VP shunt placement on 01/08/2020.    PT Comments    Pt tolerates treatment well, demonstrating improved gait quality and cognition this session. Pt tolerates community ambulation without significant gait deviation at this time. Pt also demonstrates improved orientation and less significant memory deficits, spouse reports the pt seems close to baseline. Pt is able to perform all mobility independently at this time and requires no further acute PT services. PT recommends the pt ambulate out of the room at least 3 times a day during admission. Pt has no further acute PT needs. PT updates recommendations to no PT or DME needs at the time of discharge. Acute PT signing off.  Follow Up Recommendations  No PT follow up;Supervision - Intermittent     Equipment Recommendations  None recommended by PT    Recommendations for Other Services       Precautions / Restrictions Precautions Precautions: None Restrictions Weight Bearing Restrictions: No    Mobility  Bed Mobility Overal bed mobility: Independent Bed Mobility: Supine to Sit;Sit to Supine     Supine to sit: Independent Sit to supine: Independent      Transfers Overall transfer level: Independent Equipment used: None Transfers: Sit to/from Stand Sit to Stand: Independent            Ambulation/Gait Ambulation/Gait assistance:  Independent Gait Distance (Feet): 400 Feet Assistive device: None Gait Pattern/deviations: WFL(Within Functional Limits) Gait velocity: functional Gait velocity interpretation: >2.62 ft/sec, indicative of community ambulatory General Gait Details: steady step through gait   Stairs             Wheelchair Mobility    Modified Rankin (Stroke Patients Only)       Balance Overall balance assessment: No apparent balance deficits (not formally assessed) Sitting-balance support: No upper extremity supported;Feet supported Sitting balance-Leahy Scale: Normal     Standing balance support: No upper extremity supported;During functional activity Standing balance-Leahy Scale: Normal Standing balance comment: pt able to clean floor of bathroom from standing position                            Cognition Arousal/Alertness: Awake/alert Behavior During Therapy: WFL for tasks assessed/performed Overall Cognitive Status: Impaired/Different from baseline Area of Impairment: Memory                     Memory: Decreased short-term memory                Exercises      General Comments General comments (skin integrity, edema, etc.): pt heart monitor reading tachycardia during mobility, PT believes this to be artifact as rhythm does not appear accurate on monitor      Pertinent Vitals/Pain Pain Assessment: Faces Faces Pain Scale: Hurts a little bit Pain Location: head Pain Descriptors / Indicators: Grimacing Pain Intervention(s): Monitored during session    Home Living  Prior Function            PT Goals (current goals can now be found in the care plan section) Acute Rehab PT Goals Patient Stated Goal: To return to independence Progress towards PT goals: Goals met/education completed, patient discharged from PT    Frequency           PT Plan Discharge plan needs to be updated    Co-evaluation               AM-PAC PT "6 Clicks" Mobility   Outcome Measure  Help needed turning from your back to your side while in a flat bed without using bedrails?: None Help needed moving from lying on your back to sitting on the side of a flat bed without using bedrails?: None Help needed moving to and from a bed to a chair (including a wheelchair)?: None Help needed standing up from a chair using your arms (e.g., wheelchair or bedside chair)?: None Help needed to walk in hospital room?: None Help needed climbing 3-5 steps with a railing? : None 6 Click Score: 24    End of Session   Activity Tolerance: Patient tolerated treatment well Patient left: in bed;with call bell/phone within reach;with family/visitor present Nurse Communication: Mobility status PT Visit Diagnosis: Other abnormalities of gait and mobility (R26.89);Muscle weakness (generalized) (M62.81)     Time: 6056-3729 PT Time Calculation (min) (ACUTE ONLY): 15 min  Charges:  $Gait Training: 8-22 mins                     Zenaida Niece, PT, DPT Acute Rehabilitation Pager: [phone]    Zenaida Niece 01/09/2020, 9:57 AM

## 2020-01-09 NOTE — Evaluation (Signed)
Occupational Therapy Evaluation Patient Details Name: Adam Juarez MRN: [id number] DOB: Feb 06, 1941 Today's Date: 01/09/2020    History of Present Illness 79 y.o. 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 As part of work up and MRI brain was obtained which revealed supratentorial ventriculomegaly. He was subsequently referred for high volume LP which improved his symptoms.  It was therefore recommended he undergo placement of VP shunt. Pt underwent VP shunt placement on 01/08/2020.   Clinical Impression   This 79 y/o male presents with the above. PTA pt reports being independent with ADL and functional mobility. Pt currently performing mobility and ADL tasks without AD at supervision level throughout and with no overt LOB noted. Educated in compensatory techniques for performing ADL tasks to decrease pain, pressure or discomfort at incision site with pt verbalizing understanding. Administered Short Blessed Test during session with pt receiving score of 3 (score of 0-4 indicates normal cognition). Pt noted to have some STM deficits but appears improved since initial admit and spouse in agreement. Pt to benefit from continued acute OT services to maximize his safety and independence with ADL and mobility prior to return home. Do not anticipate pt will require follow up OT services after discharge.     Follow Up Recommendations  No OT follow up;Supervision/Assistance - 24 hour (24hr initially)    Equipment Recommendations  None recommended by OT           Precautions / Restrictions Precautions Precautions: None Restrictions Weight Bearing Restrictions: No      Mobility Bed Mobility Overal bed mobility: Independent Bed Mobility: Supine to Sit;Sit to Supine     Supine to sit: Independent Sit to supine: Independent   General bed mobility  comments: pt seated EOB upon arrival, left seatd EOB end of session   Transfers Overall transfer level: Independent Equipment used: None Transfers: Sit to/from Stand Sit to Stand: Modified independent (Device/Increase time)              Balance Overall balance assessment: No apparent balance deficits (not formally assessed) Sitting-balance support: No upper extremity supported;Feet supported Sitting balance-Leahy Scale: Normal     Standing balance support: No upper extremity supported;During functional activity Standing balance-Leahy Scale: Good Standing balance comment: pt able to clean floor of bathroom from standing position                           ADL either performed or assessed with clinical judgement   ADL Overall ADL's : Needs assistance/impaired Eating/Feeding: Modified independent;Sitting   Grooming: Wash/dry face;Oral care;Supervision/safety;Standing   Upper Body Bathing: Supervision/ safety;Sitting   Lower Body Bathing: Min guard;Sit to/from stand   Upper Body Dressing : Sitting;Supervision/safety   Lower Body Dressing: Min guard;Sit to/from stand   Toilet Transfer: Supervision/safety;Ambulation Toilet Transfer Details (indicate cue type and reason): simulated via transfer to/from EOB, room level mobility  Toileting- Clothing Manipulation and Hygiene: Supervision/safety;Sitting/lateral lean;Sit to/from stand       Functional mobility during ADLs: Supervision/safety       Vision Baseline Vision/History: Wears glasses Wears Glasses: At all times Patient Visual Report: No change from baseline Vision Assessment?: No apparent visual deficits     Perception     Praxis      Pertinent Vitals/Pain Pain Assessment: Faces Faces Pain Scale: Hurts a little bit Pain Location: head Pain Descriptors / Indicators: Grimacing Pain Intervention(s):  Monitored during session     Hand Dominance Right   Extremity/Trunk Assessment Upper Extremity  Assessment Upper Extremity Assessment: Overall WFL for tasks assessed   Lower Extremity Assessment Lower Extremity Assessment: Defer to PT evaluation   Cervical / Trunk Assessment Cervical / Trunk Assessment: Normal   Communication Communication Communication: No difficulties   Cognition Arousal/Alertness: Awake/alert Behavior During Therapy: WFL for tasks assessed/performed Overall Cognitive Status: Impaired/Different from baseline Area of Impairment: Memory                     Memory: Decreased short-term memory         General Comments: pt continues to demonstrate some STM deficits, spouse reports pt close to his baseline    General Comments  HR briefly up to 121 with activity, overall maintaining in the 80s-90s    Exercises     Shoulder Instructions      Home Living Family/patient expects to be discharged to:: Private residence Living Arrangements: Spouse/significant other Available Help at Discharge: Family;Available 24 hours/day Type of Home: House Home Access: Level entry     Home Layout: One level     Bathroom Shower/Tub: Occupational psychologist: Standard     Home Equipment: Environmental consultant - 2 wheels          Prior Functioning/Environment Level of Independence: Independent                 OT Problem List: Decreased cognition;Decreased knowledge of precautions      OT Treatment/Interventions: Self-care/ADL training;Therapeutic exercise;Energy conservation;DME and/or AE instruction;Therapeutic activities;Cognitive remediation/compensation;Patient/family education;Balance training    OT Goals(Current goals can be found in the care plan section) Acute Rehab OT Goals Patient Stated Goal: To return to independence OT Goal Formulation: With patient Time For Goal Achievement: 01/23/20 Potential to Achieve Goals: Good  OT Frequency: Min 2X/week   Barriers to D/C:            Co-evaluation              AM-PAC OT "6 Clicks"  Daily Activity     Outcome Measure Help from another person eating meals?: None Help from another person taking care of personal grooming?: A Little Help from another person toileting, which includes using toliet, bedpan, or urinal?: A Little Help from another person bathing (including washing, rinsing, drying)?: A Little   Help from another person to put on and taking off regular lower body clothing?: A Little 6 Click Score: 16   End of Session Nurse Communication: Mobility status  Activity Tolerance: Patient tolerated treatment well Patient left: in bed;with call bell/phone within reach;with family/visitor present  OT Visit Diagnosis: Other symptoms and signs involving cognitive function                Time: 1031-1056 OT Time Calculation (min): 25 min Charges:  OT General Charges $OT Visit: 1 Visit OT Evaluation $OT Eval Moderate Complexity: 1 Mod OT Treatments $Self Care/Home Management : 8-22 mins  Lou Cal, OT Acute Rehabilitation Services Pager [phone] Office [phone]   Raymondo Band 01/09/2020, 12:00 PM

## 2020-01-09 NOTE — Discharge Instructions (Signed)
Wound Care Keep incision covered and dry for two days.  If you shower, cover incision with plastic wrap.  Do not put any creams, lotions, or ointments on incision. Leave steri-strips on back.  They will fall off by themselves. Activity Walk each and every day, increasing distance each day. No lifting greater than 5 lbs.  Avoid excessive neck motion. No driving for 2 weeks; may ride as a passenger locally. If provided with back brace, wear when out of bed.  It is not necessary to wear brace in bed. Diet Resume your normal diet.  Return to Work Will be discussed at you follow up appointment. Call Your Doctor If Any of These Occur Redness, drainage, or swelling at the wound.  Temperature greater than 101 degrees. Severe pain not relieved by pain medication. Incision starts to come apart. Follow Up Appt Call today for appointment in 1-2 weeks ([phone]) or for problems.  If you have any hardware placed in your spine, you will need an x-ray before your appointment.  [place]

## 2020-01-09 NOTE — Discharge Summary (Signed)
Physician Discharge Summary  Patient ID: Adam Juarez MRN: [id number] DOB/AGE: 1941-02-20 79 y.o.  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  Postoperatively doing well.  No headache.  No new neurologic symptoms.  Ambulating without difficulty.  Ready for discharge home.  Consults:   Significant Diagnostic Studies:   Treatments:   Discharge Exam: Blood pressure 127/71, pulse 79, temperature 97.9 F (36.6 C), temperature source Oral, resp. rate 18, height 5\' 10"  (1.778 m), weight 93.2 kg, SpO2 92 %. Awake and alert.  Oriented and appropriate.  Motor and sensory function intact.  Wounds clean and dry.  Chest and abdomen benign.  Disposition: Discharge disposition: 01-Home or Self Care        Allergies as of 01/09/2020      Reactions   Bee Venom Anaphylaxis, Hives   Other    Pistachios - tickles throat      Medication List    TAKE these medications   acetaminophen 500 MG tablet Commonly known as: TYLENOL Take 1,000 mg by mouth every 8 (eight) hours as needed for moderate pain.   ascorbic acid 500 MG tablet Commonly known as: VITAMIN C Take 500 mg by mouth daily.   atorvastatin 20 MG tablet Commonly known as: LIPITOR TAKE 1 TABLET DAILY FOR CHOLESTEROL   diclofenac Sodium 1 % Gel Commonly known as: VOLTAREN Apply 1 application topically 4 (four) times daily as needed (pain).   EPINEPHrine 0.3 mg/0.3 mL Soaj injection Commonly known as: EpiPen 2-Pak Inject 0.3 mLs (0.3 mg total) into the muscle as needed for anaphylaxis.   HYDROcodone-acetaminophen 5-325 MG tablet Commonly known as: NORCO/VICODIN Take 1 tablet by mouth every 4 (four) hours as needed for moderate pain.   Melatonin 1 MG Caps Take 1 mg by mouth at bedtime  as needed (sleep).   metoprolol succinate 25 MG 24 hr tablet Commonly known as: TOPROL-XL TAKE 1 TABLET IN THE MORNING AND AT BEDTIME What changed: See the new instructions.   omeprazole 20 MG capsule Commonly known as: PRILOSEC TAKE 1 CAPSULE DAILY   Potassium Gluconate 550 MG Tabs Take 550 mg by mouth daily.   PRESERVISION AREDS 2 PO Take 1 capsule by mouth 2 (two) times daily.   multivitamin with minerals tablet Take 1 tablet by mouth daily.   PROBIOTIC DAILY PO Take 1 capsule by mouth daily.   SLOW-MAG PO Take 1 tablet by mouth daily.   SYSTANE OP Apply 1 drop to eye 3 (three) times daily as needed (dry eyes).   vitamin B-12 1000 MCG tablet Commonly known as: CYANOCOBALAMIN Take 1,000 mcg by mouth daily.   Xarelto 20 MG Tabs tablet Generic drug: rivaroxaban TAKE 1 TABLET DAILY WITH SUPPER What changed: See the new instructions.        Signed: Cooper Render Treva Huyett 01/09/2020, 11:11 AM

## 2020-01-11 ENCOUNTER — Encounter (HOSPITAL_COMMUNITY): Payer: Self-pay | Admitting: Neurosurgery

## 2020-01-19 DIAGNOSIS — H9190 Unspecified hearing loss, unspecified ear: Secondary | ICD-10-CM

## 2020-01-19 NOTE — Telephone Encounter (Signed)
[place], [place]. I placed the order.

## 2020-01-25 DIAGNOSIS — H903 Sensorineural hearing loss, bilateral: Secondary | ICD-10-CM | POA: Diagnosis not present

## 2020-02-02 DIAGNOSIS — H353132 Nonexudative age-related macular degeneration, bilateral, intermediate dry stage: Secondary | ICD-10-CM | POA: Diagnosis not present

## 2020-02-04 ENCOUNTER — Telehealth: Payer: Self-pay | Admitting: Primary Care

## 2020-02-04 NOTE — Telephone Encounter (Signed)
Office visit, thanks!

## 2020-02-04 NOTE — Telephone Encounter (Signed)
Pt would like to get an x-ray of his abdomen from where a neurogen placed a stunt in four weeks ago. I scheduled him an appointment for Tuesday for an office visit since Anda Kraft did not see patient for problem. Please advise if just an xray can be ordered or if he do need an office visit.

## 2020-02-05 DIAGNOSIS — G912 (Idiopathic) normal pressure hydrocephalus: Secondary | ICD-10-CM | POA: Diagnosis not present

## 2020-02-05 DIAGNOSIS — R1031 Right lower quadrant pain: Secondary | ICD-10-CM | POA: Diagnosis not present

## 2020-02-09 ENCOUNTER — Encounter: Payer: Self-pay | Admitting: Primary Care

## 2020-02-09 ENCOUNTER — Other Ambulatory Visit: Payer: Self-pay

## 2020-02-09 ENCOUNTER — Ambulatory Visit (INDEPENDENT_AMBULATORY_CARE_PROVIDER_SITE_OTHER): Payer: Medicare Other | Admitting: Primary Care

## 2020-02-09 VITALS — BP 124/82 | HR 66 | Temp 98.2°F | Ht 70.0 in | Wt 204.0 lb

## 2020-02-09 DIAGNOSIS — Z23 Encounter for immunization: Secondary | ICD-10-CM

## 2020-02-09 DIAGNOSIS — G8918 Other acute postprocedural pain: Secondary | ICD-10-CM | POA: Diagnosis not present

## 2020-02-09 DIAGNOSIS — G912 (Idiopathic) normal pressure hydrocephalus: Secondary | ICD-10-CM

## 2020-02-09 DIAGNOSIS — R1084 Generalized abdominal pain: Secondary | ICD-10-CM | POA: Diagnosis not present

## 2020-02-09 NOTE — Progress Notes (Signed)
Subjective:    Patient ID: Adam Juarez, male    DOB: May 06, 1941, 79 y.o.   MRN: [id number]  HPI  This visit occurred during the SARS-CoV-2 public health emergency.  Safety protocols were in place, including screening questions prior to the visit, additional usage of staff PPE, and extensive cleaning of exam room while observing appropriate contact time as indicated for disinfecting solutions.   Adam Juarez is a 79 year old male with a medical history of SVT, paroxysmal atrial fibrillation, AAA, PAD, normal pressure hydrocephalus, osteoarthritis who presents today for follow-up of normal pressure hydrocephalus.  He underwent shunt placement on August 27th through [facility], Dr. Kathyrn Sheriff, and Dr. Barry Dienes who completed the abdominal portion.  He endorses a frustrating hospital experience postoperatively, and is frustrated with the lack of information provided about his postoperative condition.  He has since followed up with Dr. Kathyrn Sheriff early this month for staple removal, but wasn't provided much information.   His greatest concern today is ongoing abdominal pain since surgery in late August.  The majority of his pain is located to the bilateral lower abdomen, mostly to the right side.  He denies nausea, vomiting, diarrhea, fevers.  He was not told to follow-up with general surgery in the outpatient setting.    He sought treatment Friday last week at [facility] by an internist who believed that he was establishing care as a new patient.  He underwent lab work and an abdominal x-ray given his pain.  He endorses that lab work was normal, but is still awaiting results of his abdominal x-ray.  He was also referred to a local general surgeon for evaluation of his abdominal pain postoperatively.  He does experience constipation, and is taking laxatives and increasing fiber intake with improvement.  Review of Systems  Constitutional: Negative for appetite change.  Gastrointestinal: Positive for  abdominal pain and constipation. Negative for blood in stool, diarrhea, nausea and vomiting.  Skin: Negative for color change.  Neurological: Negative for headaches.       Past Medical History:  Diagnosis Date  . Arthritis    fingers  . 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 Chicken pox   . Dysrhythmia 09/2013   Hx. a-fib x 1 episode patient states he "auto corrected" seen at [facility]  . GERD (gastroesophageal reflux disease)   . Headache    poor posture - none recently  . History of kidney stones   . PSVT (paroxysmal supraventricular tachycardia) ([place]) 2009   Controlled with "breathing process"  . Renal stone 9/08, 6/15  . Wears dentures    full upper     Social History   Socioeconomic History  . Marital status: Married    Spouse name: Not on file  . Number of children: Not on file  . Years of education: Not on file  . Highest education level: Not on file  Occupational History  . Not on file  Tobacco Use  . Smoking status: Former Smoker    Packs/day: 0.50    Years: 38.00    Pack years: 19.00    Types: Cigarettes    Quit date: 07/13/1997    Years since quitting: 22.5  . Smokeless tobacco: Never Used  Vaping Use  . Vaping Use: Never used  Substance and Sexual Activity  . Alcohol use: No  . Drug use: No  . Sexual activity: Not on file  Other Topics Concern  . Not on file  Social History Narrative  .  Not on file   Social Determinants of Health   Financial Resource Strain:   . Difficulty of Paying Living Expenses: Not on file  Food Insecurity:   . Worried About Charity fundraiser in the Last Year: Not on file  . Ran Out of Food in the Last Year: Not on file  Transportation Needs:   . Lack of Transportation (Medical): Not on file  . Lack of Transportation (Non-Medical): Not on file  Physical Activity:   . Days of Exercise per Week: Not on file  . Minutes of Exercise per Session: Not on file  Stress:   . Feeling of Stress : Not on file    Social Connections:   . Frequency of Communication with Friends and Family: Not on file  . Frequency of Social Gatherings with Friends and Family: Not on file  . Attends Religious Services: Not on file  . Active Member of Clubs or Organizations: Not on file  . Attends Archivist Meetings: Not on file  . Marital Status: Not on file  Intimate Partner Violence:   . Fear of Current or Ex-Partner: Not on file  . Emotionally Abused: Not on file  . Physically Abused: Not on file  . Sexually Abused: Not on file    Past Surgical History:  Procedure Laterality Date  . CARDIAC CATHETERIZATION  [address] [place]  . CATARACT EXTRACTION W/PHACO Left 07/27/2015   Procedure: CATARACT EXTRACTION PHACO AND INTRAOCULAR LENS PLACEMENT (IOC);  Surgeon: Leandrew Koyanagi, MD;  Location: [facility];  Service: Ophthalmology;  Laterality: Left;  TORIC  . CATARACT EXTRACTION W/PHACO Right 08/24/2015   Procedure: CATARACT EXTRACTION PHACO AND INTRAOCULAR LENS PLACEMENT (IOC);  Surgeon: Leandrew Koyanagi, MD;  Location: [facility];  Service: Ophthalmology;  Laterality: Right;  TORIC  . HEMORROIDECTOMY  2010   banding  . IR ANGIO INTRA EXTRACRAN SEL COM CAROTID INNOMINATE BILAT MOD SED  10/30/2019  . IR ANGIO VERTEBRAL SEL VERTEBRAL UNI R MOD SED  10/30/2019  . IR US GUIDE VASC ACCESS RIGHT  10/30/2019  . KIDNEY STONE SURGERY  9/08, 6/15   lithotripsy  . LAPAROSCOPIC REVISION VENTRICULAR-PERITONEAL (V-P) SHUNT  01/08/2020   Procedure: LAPAROSCOPIC REVISION VENTRICULAR-PERITONEAL (V-P) SHUNT;  Surgeon: Consuella Lose, MD;  Location: [facility];  Service: Neurosurgery;;  . PROSTATE BIOPSY  2007  . SKIN CANCER EXCISION    . TOENAIL TRIMMING  11/2017   Dr. Elvina Mattes  . TONSILLECTOMY    . VASECTOMY    . VENTRICULOPERITONEAL SHUNT Right 01/08/2020   Procedure: SHUNT INSERTION VENTRICULAR-PERITONEAL;  Surgeon: Consuella Lose, MD;  Location: [facility];  Service: Neurosurgery;   Laterality: Right;    Family History  Problem Relation Age of Onset  . AAA (abdominal aortic aneurysm) Father   . Parkinson's disease Mother   . Arthritis Mother     Allergies  Allergen Reactions  . Bee Venom Anaphylaxis and Hives  . Other     Pistachios - tickles throat     Current Outpatient Medications on File Prior to Visit  Medication Sig Dispense Refill  . acetaminophen (TYLENOL) 500 MG tablet Take 1,000 mg by mouth every 8 (eight) hours as needed for moderate pain.    Marland Kitchen ascorbic acid (VITAMIN C) 500 MG tablet Take 500 mg by mouth daily.    Marland Kitchen atorvastatin (LIPITOR) 20 MG tablet TAKE 1 TABLET DAILY FOR CHOLESTEROL 90 tablet 3  . diclofenac Sodium (VOLTAREN) 1 % GEL Apply 1 application topically 4 (four)  times daily as needed (pain).    Marland Kitchen EPINEPHrine (EPIPEN 2-PAK) 0.3 mg/0.3 mL IJ SOAJ injection Inject 0.3 mLs (0.3 mg total) into the muscle as needed for anaphylaxis. 2 each 0  . HYDROcodone-acetaminophen (NORCO/VICODIN) 5-325 MG tablet Take 1 tablet by mouth every 4 (four) hours as needed for moderate pain. 30 tablet 0  . Magnesium Cl-Calcium Carbonate (SLOW-MAG PO) Take 1 tablet by mouth daily.     . Melatonin 1 MG CAPS Take 1 mg by mouth at bedtime as needed (sleep).    . metoprolol succinate (TOPROL-XL) 25 MG 24 hr tablet TAKE 1 TABLET IN THE MORNING AND AT BEDTIME (Patient taking differently: Take 25 mg by mouth 2 (two) times daily. ) 180 tablet 2  . Multiple Vitamins-Minerals (MULTIVITAMIN WITH MINERALS) tablet Take 1 tablet by mouth daily.    . Multiple Vitamins-Minerals (PRESERVISION AREDS 2 PO) Take 1 capsule by mouth 2 (two) times daily.     Marland Kitchen omeprazole (PRILOSEC) 20 MG capsule TAKE 1 CAPSULE DAILY (Patient taking differently: Take 20 mg by mouth daily. ) 90 capsule 1  . Polyethyl Glycol-Propyl Glycol (SYSTANE OP) Apply 1 drop to eye 3 (three) times daily as needed (dry eyes).     . Potassium Gluconate 550 MG TABS Take 550 mg by mouth daily.    . Probiotic Product  (PROBIOTIC DAILY PO) Take 1 capsule by mouth daily.     . vitamin B-12 (CYANOCOBALAMIN) 1000 MCG tablet Take 1,000 mcg by mouth daily.    Alveda Reasons 20 MG TABS tablet TAKE 1 TABLET DAILY WITH SUPPER (Patient taking differently: Take 20 mg by mouth daily with supper. ) 90 tablet 3   No current facility-administered medications on file prior to visit.    BP 124/82   Pulse 66   Temp 98.2 F (36.8 C) (Temporal)   Ht 5\' 10"  (1.778 m)   Wt 204 lb (92.5 kg)   SpO2 96%   BMI 29.27 kg/m    Objective:   Physical Exam Abdominal:     General: Abdomen is flat. Bowel sounds are normal.     Palpations: Abdomen is soft.     Tenderness: There is abdominal tenderness in the right upper quadrant, right lower quadrant and epigastric area.  Skin:    General: Skin is warm and dry.     Findings: No erythema.     Comments: Incision sites healed well to abdomen  Neurological:     Mental Status: He is alert and oriented to person, place, and time.     Comments: Appears very coherent, much more alert and conversational than his prior visit.            Assessment & Plan:

## 2020-02-09 NOTE — Patient Instructions (Addendum)
You should be getting a call from Dr. Ines Bloomer office (general surgeon) to evaluate your abdomen.  We will be in touch once we receive your xray results.  It was a pleasure to see you today!   Influenza (Flu) Vaccine (Inactivated or Recombinant): What You Need to Know 1. Why get vaccinated? Influenza vaccine can prevent influenza (flu). Flu is a contagious disease that spreads around the [place] every year, usually between October and May. Anyone can get the flu, but it is more dangerous for some people. Infants and young children, people 15 years of age and older, pregnant women, and people with certain health conditions or a weakened immune system are at greatest risk of flu complications. Pneumonia, bronchitis, sinus infections and ear infections are examples of flu-related complications. If you have a medical condition, such as heart disease, cancer or diabetes, flu can make it worse. Flu can cause fever and chills, sore throat, muscle aches, fatigue, cough, headache, and runny or stuffy nose. Some people may have vomiting and diarrhea, though this is more common in children than adults. Each year thousands of people in the [place] die from flu, and many more are hospitalized. Flu vaccine prevents millions of illnesses and flu-related visits to the doctor each year. 2. Influenza vaccine CDC recommends everyone 53 months of age and older get vaccinated every flu season. Children 6 months through 75 years of age may need 2 doses during a single flu season. Everyone else needs only 1 dose each flu season. It takes about 2 weeks for protection to develop after vaccination. There are many flu viruses, and they are always changing. Each year a new flu vaccine is made to protect against three or four viruses that are likely to cause disease in the upcoming flu season. Even when the vaccine doesn't exactly match these viruses, it may still provide some protection. Influenza vaccine does not  cause flu. Influenza vaccine may be given at the same time as other vaccines. 3. Talk with your health care provider Tell your vaccine provider if the person getting the vaccine:  Has had an allergic reaction after a previous dose of influenza vaccine, or has any severe, life-threatening allergies.  Has ever had Guillain-Barr Syndrome (also called GBS). In some cases, your health care provider may decide to postpone influenza vaccination to a future visit. People with minor illnesses, such as a cold, may be vaccinated. People who are moderately or severely ill should usually wait until they recover before getting influenza vaccine. Your health care provider can give you more information. 4. Risks of a vaccine reaction  Soreness, redness, and swelling where shot is given, fever, muscle aches, and headache can happen after influenza vaccine.  There may be a very small increased risk of Guillain-Barr Syndrome (GBS) after inactivated influenza vaccine (the flu shot). Young children who get the flu shot along with pneumococcal vaccine (PCV13), and/or DTaP vaccine at the same time might be slightly more likely to have a seizure caused by fever. Tell your health care provider if a child who is getting flu vaccine has ever had a seizure. People sometimes faint after medical procedures, including vaccination. Tell your provider if you feel dizzy or have vision changes or ringing in the ears. As with any medicine, there is a very remote chance of a vaccine causing a severe allergic reaction, other serious injury, or death. 5. What if there is a serious problem? An allergic reaction could occur after the vaccinated person leaves the  clinic. If you see signs of a severe allergic reaction (hives, swelling of the face and throat, difficulty breathing, a fast heartbeat, dizziness, or weakness), call 9-1-1 and get the person to the nearest hospital. For other signs that concern you, call your health care  provider. Adverse reactions should be reported to the Vaccine Adverse Event Reporting System (VAERS). Your health care provider will usually file this report, or you can do it yourself. Visit the VAERS website at www.vaers.SamedayNews.es or call (902)458-3242.VAERS is only for reporting reactions, and VAERS staff do not give medical advice. 6. The National Vaccine Injury Compensation Program The [employer] Vaccine Injury Compensation Program (VICP) is a federal program that was created to compensate people who may have been injured by certain vaccines. Visit the VICP website at GoldCloset.com.ee or call (801)724-2687 to learn about the program and about filing a claim. There is a time limit to file a claim for compensation. 7. How can I learn more?  Ask your healthcare provider.  Call your local or state health department.  Contact the Centers for Disease Control and Prevention (CDC): ? Call 548-276-5190 (1-800-CDC-INFO) or ? Visit CDC's https://gibson.com/ Vaccine Information Statement (Interim) Inactivated Influenza Vaccine (12/26/2017) This information is not intended to replace advice given to you by your health care provider. Make sure you discuss any questions you have with your health care provider. Document Revised: 08/19/2018 Document Reviewed: 12/30/2017 Elsevier Patient Education  [place].

## 2020-02-09 NOTE — Assessment & Plan Note (Signed)
Status post shunt placement as of January 08, 2020.  Following with neurosurgery and neurology through [facility].  He has been referred to a local general surgeon in regard to his abdominal pain postoperatively.  Today he does not appear to have any complications in terms of infection.  Labs reviewed from care everywhere.  Overall appears to be doing well from a cognitive standpoint since operation.

## 2020-02-09 NOTE — Assessment & Plan Note (Signed)
Since placement of shunt for normal pressure hydrocephalus in late August 2021.  Today his exam does not suggest internal infection which is reassuring.  Labs from care everywhere reviewed which also do not show evidence of infection.  Abdominal plain films were not available for viewing, we did fax for results.  I agree that he needs to follow-up with general surgery for further evaluation of postoperative abdominal pain given shunt placement.  Referral was placed Friday last week, I asked for patient to notify me if he does not receive a call regarding this soon.

## 2020-02-17 ENCOUNTER — Ambulatory Visit: Payer: Medicare Other | Admitting: Neurology

## 2020-02-18 DIAGNOSIS — M79675 Pain in left toe(s): Secondary | ICD-10-CM | POA: Diagnosis not present

## 2020-02-18 DIAGNOSIS — M79674 Pain in right toe(s): Secondary | ICD-10-CM | POA: Diagnosis not present

## 2020-02-18 DIAGNOSIS — B351 Tinea unguium: Secondary | ICD-10-CM | POA: Diagnosis not present

## 2020-03-02 DIAGNOSIS — Z23 Encounter for immunization: Secondary | ICD-10-CM | POA: Diagnosis not present

## 2020-03-17 DIAGNOSIS — R109 Unspecified abdominal pain: Secondary | ICD-10-CM | POA: Diagnosis not present

## 2020-03-17 DIAGNOSIS — I671 Cerebral aneurysm, nonruptured: Secondary | ICD-10-CM | POA: Diagnosis not present

## 2020-03-17 DIAGNOSIS — G912 (Idiopathic) normal pressure hydrocephalus: Secondary | ICD-10-CM | POA: Diagnosis not present

## 2020-03-17 DIAGNOSIS — R4189 Other symptoms and signs involving cognitive functions and awareness: Secondary | ICD-10-CM | POA: Diagnosis not present

## 2020-03-18 ENCOUNTER — Encounter: Payer: Self-pay | Admitting: Gastroenterology

## 2020-03-18 ENCOUNTER — Telehealth: Payer: Self-pay | Admitting: Primary Care

## 2020-03-18 ENCOUNTER — Other Ambulatory Visit (HOSPITAL_COMMUNITY): Payer: Self-pay | Admitting: Interventional Radiology

## 2020-03-18 ENCOUNTER — Other Ambulatory Visit: Payer: Self-pay

## 2020-03-18 DIAGNOSIS — K649 Unspecified hemorrhoids: Secondary | ICD-10-CM

## 2020-03-18 DIAGNOSIS — I671 Cerebral aneurysm, nonruptured: Secondary | ICD-10-CM

## 2020-03-18 NOTE — Telephone Encounter (Signed)
Patient requested referral to GI for hemorrhoids

## 2020-03-18 NOTE — Telephone Encounter (Signed)
Pt called in wanted to know about getting a referral to [facility] . Please advise

## 2020-03-18 NOTE — Telephone Encounter (Signed)
It appears that he has an appointment scheduled for 03/22/2020 with GI. We will place referral for insurance purposes.

## 2020-03-22 ENCOUNTER — Ambulatory Visit: Payer: TRICARE For Life (TFL) | Admitting: Gastroenterology

## 2020-04-04 ENCOUNTER — Ambulatory Visit (HOSPITAL_COMMUNITY)
Admission: RE | Admit: 2020-04-04 | Discharge: 2020-04-04 | Disposition: A | Discharge status: Home / Self Care | Payer: Medicare Other | Source: Ambulatory Visit | Attending: Interventional Radiology | Admitting: Interventional Radiology

## 2020-04-04 ENCOUNTER — Other Ambulatory Visit: Payer: Self-pay

## 2020-04-04 DIAGNOSIS — I671 Cerebral aneurysm, nonruptured: Secondary | ICD-10-CM | POA: Diagnosis not present

## 2020-04-04 DIAGNOSIS — G912 (Idiopathic) normal pressure hydrocephalus: Secondary | ICD-10-CM | POA: Diagnosis not present

## 2020-04-06 DIAGNOSIS — Z03818 Encounter for observation for suspected exposure to other biological agents ruled out: Secondary | ICD-10-CM | POA: Diagnosis not present

## 2020-04-06 DIAGNOSIS — Z1152 Encounter for screening for COVID-19: Secondary | ICD-10-CM | POA: Diagnosis not present

## 2020-04-08 DIAGNOSIS — Z1152 Encounter for screening for COVID-19: Secondary | ICD-10-CM | POA: Diagnosis not present

## 2020-04-08 DIAGNOSIS — Z03818 Encounter for observation for suspected exposure to other biological agents ruled out: Secondary | ICD-10-CM | POA: Diagnosis not present

## 2020-04-11 HISTORY — PX: IR RADIOLOGIST EVAL & MGMT: IMG5224

## 2020-04-12 ENCOUNTER — Ambulatory Visit: Payer: TRICARE For Life (TFL) | Admitting: Gastroenterology

## 2020-04-21 DIAGNOSIS — R1031 Right lower quadrant pain: Secondary | ICD-10-CM | POA: Insufficient documentation

## 2020-04-21 DIAGNOSIS — I480 Paroxysmal atrial fibrillation: Secondary | ICD-10-CM | POA: Diagnosis not present

## 2020-04-21 DIAGNOSIS — K5904 Chronic idiopathic constipation: Secondary | ICD-10-CM | POA: Insufficient documentation

## 2020-04-21 DIAGNOSIS — R1032 Left lower quadrant pain: Secondary | ICD-10-CM | POA: Diagnosis not present

## 2020-04-21 DIAGNOSIS — Z982 Presence of cerebrospinal fluid drainage device: Secondary | ICD-10-CM | POA: Insufficient documentation

## 2020-04-21 DIAGNOSIS — G912 (Idiopathic) normal pressure hydrocephalus: Secondary | ICD-10-CM | POA: Diagnosis not present

## 2020-04-21 DIAGNOSIS — K642 Third degree hemorrhoids: Secondary | ICD-10-CM | POA: Insufficient documentation

## 2020-04-21 DIAGNOSIS — I714 Abdominal aortic aneurysm, without rupture: Secondary | ICD-10-CM | POA: Diagnosis not present

## 2020-04-25 ENCOUNTER — Other Ambulatory Visit: Payer: Self-pay

## 2020-04-25 ENCOUNTER — Other Ambulatory Visit: Payer: Self-pay | Admitting: Cardiovascular Disease

## 2020-04-25 ENCOUNTER — Ambulatory Visit (INDEPENDENT_AMBULATORY_CARE_PROVIDER_SITE_OTHER): Payer: Medicare Other

## 2020-04-25 DIAGNOSIS — I714 Abdominal aortic aneurysm, without rupture, unspecified: Secondary | ICD-10-CM

## 2020-04-26 ENCOUNTER — Ambulatory Visit: Payer: TRICARE For Life (TFL)

## 2020-04-27 DIAGNOSIS — D2271 Melanocytic nevi of right lower limb, including hip: Secondary | ICD-10-CM | POA: Diagnosis not present

## 2020-04-27 DIAGNOSIS — D2262 Melanocytic nevi of left upper limb, including shoulder: Secondary | ICD-10-CM | POA: Diagnosis not present

## 2020-04-27 DIAGNOSIS — C44622 Squamous cell carcinoma of skin of right upper limb, including shoulder: Secondary | ICD-10-CM | POA: Diagnosis not present

## 2020-04-27 DIAGNOSIS — D2261 Melanocytic nevi of right upper limb, including shoulder: Secondary | ICD-10-CM | POA: Diagnosis not present

## 2020-04-27 DIAGNOSIS — L821 Other seborrheic keratosis: Secondary | ICD-10-CM | POA: Diagnosis not present

## 2020-04-27 DIAGNOSIS — D225 Melanocytic nevi of trunk: Secondary | ICD-10-CM | POA: Diagnosis not present

## 2020-04-27 DIAGNOSIS — L570 Actinic keratosis: Secondary | ICD-10-CM | POA: Diagnosis not present

## 2020-04-27 DIAGNOSIS — D485 Neoplasm of uncertain behavior of skin: Secondary | ICD-10-CM | POA: Diagnosis not present

## 2020-04-27 DIAGNOSIS — Z85828 Personal history of other malignant neoplasm of skin: Secondary | ICD-10-CM | POA: Diagnosis not present

## 2020-04-27 DIAGNOSIS — X32XXXA Exposure to sunlight, initial encounter: Secondary | ICD-10-CM | POA: Diagnosis not present

## 2020-05-03 ENCOUNTER — Ambulatory Visit: Payer: TRICARE For Life (TFL) | Admitting: Gastroenterology

## 2020-05-04 ENCOUNTER — Telehealth: Payer: Self-pay

## 2020-05-04 ENCOUNTER — Other Ambulatory Visit: Payer: Self-pay | Admitting: Primary Care

## 2020-05-04 NOTE — Telephone Encounter (Signed)
-----   Message from Minna Merritts, MD sent at 05/04/2020  9:17 AM EST ----- Aorta u/s Minimal change in aorta dilation, 3.9 cm, up from 3.8 Repeat u/s one year

## 2020-05-04 NOTE — Telephone Encounter (Signed)
Left detail message on pt's answering machine (DPR approved), regarding his recent aorta u/s, Dr. Rockey Situ had a chance to review his results and advised   "Minimal change in aorta dilation, 3.9 cm, up from 3.8 Repeat u/s one year  Noted pt has seen his results at 09:49am this morning that Dr. Rockey Situ had released to pt's MyChart. Advised pt to call back for any concerns or questions regarding his test, otherwise we will see him at his next appt.

## 2020-05-16 ENCOUNTER — Other Ambulatory Visit: Payer: Self-pay | Admitting: Cardiovascular Disease

## 2020-05-16 NOTE — Telephone Encounter (Signed)
Pt's age 79, wt 92.5 kg, SCr 0.93, CrCl 85.65, last ov w/ TG 09/15/19.

## 2020-05-16 NOTE — Telephone Encounter (Signed)
Refill request

## 2020-05-19 DIAGNOSIS — G912 (Idiopathic) normal pressure hydrocephalus: Secondary | ICD-10-CM | POA: Diagnosis not present

## 2020-05-19 DIAGNOSIS — I720 Aneurysm of carotid artery: Secondary | ICD-10-CM | POA: Diagnosis not present

## 2020-05-26 DIAGNOSIS — D2361 Other benign neoplasm of skin of right upper limb, including shoulder: Secondary | ICD-10-CM | POA: Diagnosis not present

## 2020-05-26 DIAGNOSIS — C44622 Squamous cell carcinoma of skin of right upper limb, including shoulder: Secondary | ICD-10-CM | POA: Diagnosis not present

## 2020-06-16 DIAGNOSIS — I720 Aneurysm of carotid artery: Secondary | ICD-10-CM | POA: Diagnosis not present

## 2020-06-16 DIAGNOSIS — G912 (Idiopathic) normal pressure hydrocephalus: Secondary | ICD-10-CM | POA: Diagnosis not present

## 2020-06-16 DIAGNOSIS — Z982 Presence of cerebrospinal fluid drainage device: Secondary | ICD-10-CM | POA: Diagnosis not present

## 2020-06-23 DIAGNOSIS — K649 Unspecified hemorrhoids: Secondary | ICD-10-CM

## 2020-06-24 DIAGNOSIS — G910 Communicating hydrocephalus: Secondary | ICD-10-CM | POA: Diagnosis not present

## 2020-06-24 DIAGNOSIS — I671 Cerebral aneurysm, nonruptured: Secondary | ICD-10-CM | POA: Diagnosis not present

## 2020-06-27 MED ORDER — HYDROCORTISONE ACETATE 25 MG RE SUPP: 25.0000 mg | Freq: Two times a day (BID) | RECTAL | 0 refills | Status: DC

## 2020-07-07 DIAGNOSIS — G912 (Idiopathic) normal pressure hydrocephalus: Secondary | ICD-10-CM | POA: Diagnosis not present

## 2020-07-14 DIAGNOSIS — K642 Third degree hemorrhoids: Secondary | ICD-10-CM | POA: Diagnosis not present

## 2020-07-14 DIAGNOSIS — R1031 Right lower quadrant pain: Secondary | ICD-10-CM | POA: Diagnosis not present

## 2020-07-14 DIAGNOSIS — R1032 Left lower quadrant pain: Secondary | ICD-10-CM | POA: Diagnosis not present

## 2020-07-14 DIAGNOSIS — Z1211 Encounter for screening for malignant neoplasm of colon: Secondary | ICD-10-CM | POA: Insufficient documentation

## 2020-07-14 DIAGNOSIS — K5904 Chronic idiopathic constipation: Secondary | ICD-10-CM | POA: Diagnosis not present

## 2020-07-21 ENCOUNTER — Other Ambulatory Visit: Payer: Self-pay

## 2020-07-21 ENCOUNTER — Telehealth: Payer: Self-pay

## 2020-07-21 ENCOUNTER — Ambulatory Visit (INDEPENDENT_AMBULATORY_CARE_PROVIDER_SITE_OTHER): Payer: Medicare Other | Admitting: Primary Care

## 2020-07-21 VITALS — BP 126/70 | HR 64 | Temp 97.7°F | Ht 70.0 in | Wt 202.0 lb

## 2020-07-21 DIAGNOSIS — G912 (Idiopathic) normal pressure hydrocephalus: Secondary | ICD-10-CM

## 2020-07-21 DIAGNOSIS — Z87898 Personal history of other specified conditions: Secondary | ICD-10-CM | POA: Diagnosis not present

## 2020-07-21 DIAGNOSIS — M1990 Unspecified osteoarthritis, unspecified site: Secondary | ICD-10-CM

## 2020-07-21 DIAGNOSIS — K219 Gastro-esophageal reflux disease without esophagitis: Secondary | ICD-10-CM

## 2020-07-21 DIAGNOSIS — R7303 Prediabetes: Secondary | ICD-10-CM | POA: Diagnosis not present

## 2020-07-21 DIAGNOSIS — Z125 Encounter for screening for malignant neoplasm of prostate: Secondary | ICD-10-CM | POA: Diagnosis not present

## 2020-07-21 DIAGNOSIS — I700 Atherosclerosis of aorta: Secondary | ICD-10-CM

## 2020-07-21 DIAGNOSIS — D696 Thrombocytopenia, unspecified: Secondary | ICD-10-CM | POA: Diagnosis not present

## 2020-07-21 DIAGNOSIS — I714 Abdominal aortic aneurysm, without rupture, unspecified: Secondary | ICD-10-CM

## 2020-07-21 DIAGNOSIS — I480 Paroxysmal atrial fibrillation: Secondary | ICD-10-CM | POA: Diagnosis not present

## 2020-07-21 DIAGNOSIS — R413 Other amnesia: Secondary | ICD-10-CM

## 2020-07-21 DIAGNOSIS — M546 Pain in thoracic spine: Secondary | ICD-10-CM | POA: Diagnosis not present

## 2020-07-21 DIAGNOSIS — K649 Unspecified hemorrhoids: Secondary | ICD-10-CM | POA: Diagnosis not present

## 2020-07-21 DIAGNOSIS — E782 Mixed hyperlipidemia: Secondary | ICD-10-CM | POA: Diagnosis not present

## 2020-07-21 DIAGNOSIS — G8929 Other chronic pain: Secondary | ICD-10-CM

## 2020-07-21 DIAGNOSIS — R5382 Chronic fatigue, unspecified: Secondary | ICD-10-CM

## 2020-07-21 NOTE — Progress Notes (Signed)
Subjective:    Patient ID: Adam Juarez, male    DOB: [birth year], 80 y.o.   MRN: [id number]  HPI  Adam Juarez is a very pleasant 80 y.o. male with a significant medical history including paroxysmal atrial fibrillation, AAA, normal pressure hydrocephalus, osteoarthritis, chronic back pain, chronic fatigue, frequent headaches, PAD, tobacco abuse, elevated PSA who presents today for follow-up of chronic conditions.  Following with [facility], had an appointment today, felt very good about the visit today. History of shunt placement in 2021. He will undergo repeat lumbar puncture to confirm NPH diagnosis in the near future. If lumbar puncture is negative then referral to neurologist will be placed. His wife has noticed risky behavior with his driving, he startles easily, and is drumming on objects constantly.   Following with cardiology for ongoing management of AAA.  Vascular ultrasound completed in December 2021, very minimal changes to known infrarenal aneurysm.  Due for repeat ultrasound this December.He and his wife are questioning whether he should continue atorvastatin.  He does have a history of AAA, aortic atherosclerosis, hyperlipidemia, PAD.  Compliant to Xarelto 20 mg daily.  Doing well on omeprazole for esophageal reflux, denies breakthrough symptoms.  Immunizations: -Tetanus: 2013 -Influenza: Completed this season  -Covid-19: Completed two vaccines -Shingles: Completed Zostavax in 2012 -Pneumonia: Prevnar in 2018, Pneumovax in 2012  Colonoscopy:  Declines due to age. PSA: Due  BP Readings from Last 3 Encounters:  07/21/20 126/70  02/09/20 124/82  01/09/20 132/73       Review of Systems  Respiratory: Negative for shortness of breath.   Cardiovascular: Negative for chest pain.  Musculoskeletal: Positive for arthralgias and back pain.  Neurological: Negative for dizziness and headaches.       See HPI  Psychiatric/Behavioral: Positive for confusion.       See  HPI         Past Medical History:  Diagnosis Date  . Arthritis    fingers  . 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 Chicken pox   . Dysrhythmia 09/2013   Hx. a-fib x 1 episode patient states he "auto corrected" seen at [facility]  . GERD (gastroesophageal reflux disease)   . Headache    poor posture - none recently  . History of kidney stones   . PSVT (paroxysmal supraventricular tachycardia) ([place]) 2009   Controlled with "breathing process"  . Renal stone 9/08, 6/15  . Wears dentures    full upper    Social History   Socioeconomic History  . Marital status: Married    Spouse name: Not on file  . Number of children: Not on file  . Years of education: Not on file  . Highest education level: Not on file  Occupational History  . Not on file  Tobacco Use  . Smoking status: Former Smoker    Packs/day: 0.50    Years: 38.00    Pack years: 19.00    Types: Cigarettes    Quit date: 07/13/1997    Years since quitting: 23.0  . Smokeless tobacco: Never Used  Vaping Use  . Vaping Use: Never used  Substance and Sexual Activity  . Alcohol use: No  . Drug use: No  . Sexual activity: Not on file  Other Topics Concern  . Not on file  Social History Narrative  . Not on file   Social Determinants of Health   Financial Resource Strain: Not on file  Food Insecurity: Not on file  Transportation Needs:  Not on file  Physical Activity: Not on file  Stress: Not on file  Social Connections: Not on file  Intimate Partner Violence: Not on file    Past Surgical History:  Procedure Laterality Date  . CARDIAC CATHETERIZATION  [address] [place]  . CATARACT EXTRACTION W/PHACO Left 07/27/2015   Procedure: CATARACT EXTRACTION PHACO AND INTRAOCULAR LENS PLACEMENT (IOC);  Surgeon: Leandrew Koyanagi, MD;  Location: [facility];  Service: Ophthalmology;  Laterality: Left;  TORIC  . CATARACT EXTRACTION W/PHACO Right 08/24/2015   Procedure: CATARACT EXTRACTION  PHACO AND INTRAOCULAR LENS PLACEMENT (IOC);  Surgeon: Leandrew Koyanagi, MD;  Location: [facility];  Service: Ophthalmology;  Laterality: Right;  TORIC  . HEMORROIDECTOMY  2010   banding  . IR ANGIO INTRA EXTRACRAN SEL COM CAROTID INNOMINATE BILAT MOD SED  10/30/2019  . IR ANGIO VERTEBRAL SEL VERTEBRAL UNI R MOD SED  10/30/2019  . IR RADIOLOGIST EVAL & MGMT  04/11/2020  . IR US GUIDE VASC ACCESS RIGHT  10/30/2019  . KIDNEY STONE SURGERY  9/08, 6/15   lithotripsy  . LAPAROSCOPIC REVISION VENTRICULAR-PERITONEAL (V-P) SHUNT  01/08/2020   Procedure: LAPAROSCOPIC REVISION VENTRICULAR-PERITONEAL (V-P) SHUNT;  Surgeon: Consuella Lose, MD;  Location: [facility];  Service: Neurosurgery;;  . PROSTATE BIOPSY  2007  . SKIN CANCER EXCISION    . TOENAIL TRIMMING  11/2017   Dr. Elvina Mattes  . TONSILLECTOMY    . VASECTOMY    . VENTRICULOPERITONEAL SHUNT Right 01/08/2020   Procedure: SHUNT INSERTION VENTRICULAR-PERITONEAL;  Surgeon: Consuella Lose, MD;  Location: [facility];  Service: Neurosurgery;  Laterality: Right;    Family History  Problem Relation Age of Onset  . AAA (abdominal aortic aneurysm) Father   . Parkinson's disease Mother   . Arthritis Mother     Allergies  Allergen Reactions  . Bee Venom Anaphylaxis and Hives  . Other     Pistachios - tickles throat     Current Outpatient Medications on File Prior to Visit  Medication Sig Dispense Refill  . acetaminophen (TYLENOL) 500 MG tablet Take 1,000 mg by mouth every 8 (eight) hours as needed for moderate pain.    Marland Kitchen ascorbic acid (VITAMIN C) 500 MG tablet Take 500 mg by mouth daily.    Marland Kitchen atorvastatin (LIPITOR) 20 MG tablet TAKE 1 TABLET DAILY FOR CHOLESTEROL 90 tablet 3  . diclofenac Sodium (VOLTAREN) 1 % GEL Apply 1 application topically 4 (four) times daily as needed (pain).    Marland Kitchen EPINEPHrine (EPIPEN 2-PAK) 0.3 mg/0.3 mL IJ SOAJ injection Inject 0.3 mLs (0.3 mg total) into the muscle as needed for anaphylaxis. 2 each 0  .  HYDROcodone-acetaminophen (NORCO/VICODIN) 5-325 MG tablet Take 1 tablet by mouth every 4 (four) hours as needed for moderate pain. 30 tablet 0  . hydrocortisone (ANUSOL-HC) 25 MG suppository Place 1 suppository (25 mg total) rectally 2 (two) times daily. 30 suppository 0  . Magnesium Cl-Calcium Carbonate (SLOW-MAG PO) Take 1 tablet by mouth daily.     . Melatonin 1 MG CAPS Take 1 mg by mouth at bedtime as needed (sleep).    . metoprolol succinate (TOPROL-XL) 25 MG 24 hr tablet TAKE 1 TABLET IN THE MORNING AND AT BEDTIME (Patient taking differently: Take 25 mg by mouth 2 (two) times daily.) 180 tablet 2  . Multiple Vitamins-Minerals (MULTIVITAMIN WITH MINERALS) tablet Take 1 tablet by mouth daily.    . Multiple Vitamins-Minerals (PRESERVISION AREDS 2 PO) Take 1 capsule by mouth 2 (two) times daily.     Marland Kitchen  omeprazole (PRILOSEC) 20 MG capsule TAKE 1 CAPSULE DAILY 90 capsule 3  . Polyethyl Glycol-Propyl Glycol (SYSTANE OP) Apply 1 drop to eye 3 (three) times daily as needed (dry eyes).     . Potassium Gluconate 550 MG TABS Take 550 mg by mouth daily.    . Probiotic Product (PROBIOTIC DAILY PO) Take 1 capsule by mouth daily.     . vitamin B-12 (CYANOCOBALAMIN) 1000 MCG tablet Take 1,000 mcg by mouth daily.    Alveda Reasons 20 MG TABS tablet TAKE 1 TABLET DAILY WITH SUPPER 90 tablet 3   No current facility-administered medications on file prior to visit.    Temp 97.7 F (36.5 C) (Temporal)   Ht 5\' 10"  (1.778 m)   Wt 202 lb (91.6 kg)   BMI 28.98 kg/m  Objective:   Physical Exam Eyes:     Extraocular Movements: Extraocular movements intact.  Cardiovascular:     Rate and Rhythm: Normal rate and regular rhythm.  Pulmonary:     Effort: Pulmonary effort is normal.     Breath sounds: Normal breath sounds. No wheezing or rales.  Musculoskeletal:     Cervical back: Neck supple.  Skin:    General: Skin is warm and dry.  Neurological:     Mental Status: He is alert and oriented to person, place, and  time.     Cranial Nerves: No cranial nerve deficit.           Assessment & Plan:      This visit occurred during the SARS-CoV-2 public health emergency.  Safety protocols were in place, including screening questions prior to the visit, additional usage of staff PPE, and extensive cleaning of exam room while observing appropriate contact time as indicated for disinfecting solutions.

## 2020-07-21 NOTE — Assessment & Plan Note (Signed)
Chronic, no worse, improved with regular activity.

## 2020-07-21 NOTE — Assessment & Plan Note (Signed)
Chronic, currently following with Dr. [name] through [facility], currently using Anusol suppositories PRN.

## 2020-07-21 NOTE — Assessment & Plan Note (Signed)
Follows with cardiology, last vascular ultrasound from December 2021 reviewed. Minimal change, repeat US due in December 2022.

## 2020-07-21 NOTE — Telephone Encounter (Signed)
Pt said he has CPX scheduled with Gentry Fitz NP today at 3 PM and has not had CPX labs yet; pt said he is presently at [facility] and wants to know if can come today for CPX and have lab appt on 07/22/20. Joellen CMA said for pt to come to CPX today and if fasting labs are needed will schedule at later time. Pt voiced understanding and nothing further needed.

## 2020-07-21 NOTE — Telephone Encounter (Signed)
Noted and agree. 

## 2020-07-21 NOTE — Assessment & Plan Note (Signed)
Commended him on regular activity, encouraged to continue.  Repeat A1c pending.

## 2020-07-21 NOTE — Assessment & Plan Note (Signed)
Doing well on omeprazole 20 mg, continue same. 

## 2020-07-21 NOTE — Assessment & Plan Note (Signed)
Stable, improved with regular walking.  Continue same.

## 2020-07-21 NOTE — Patient Instructions (Signed)
Schedule a lab appointment to return at your convenience when fasting 4 hours.   Continue regular activity and walking.  Continue to work on a healthy diet.   It was a pleasure to see you today!

## 2020-07-21 NOTE — Assessment & Plan Note (Signed)
Strongly advised that he continue atorvastatin 20 mg daily, especially given his cardiac history.  He agrees and will continue.  Repeat lipid panel pending.

## 2020-07-21 NOTE — Assessment & Plan Note (Signed)
Rate and rhythm regular today, continue Xarelto 20 mg daily.

## 2020-07-21 NOTE — Assessment & Plan Note (Signed)
Improving with regular activity, encouraged to walk daily.

## 2020-07-21 NOTE — Assessment & Plan Note (Signed)
Currently following with neurosurgery through [facility].  Recent visit reviewed, plan is to undergo a lumbar puncture within the near future.

## 2020-07-21 NOTE — Assessment & Plan Note (Signed)
Ongoing, wife noticing odd behavior intermittently. Plan is for neurology evaluation if lumbar puncture is not suggestive of NPH.

## 2020-07-21 NOTE — Assessment & Plan Note (Signed)
Compliant to atorvastatin 20 mg, repeat lipid panel pending.  

## 2020-07-22 ENCOUNTER — Other Ambulatory Visit: Payer: Self-pay

## 2020-07-22 ENCOUNTER — Other Ambulatory Visit (INDEPENDENT_AMBULATORY_CARE_PROVIDER_SITE_OTHER): Payer: Medicare Other

## 2020-07-22 DIAGNOSIS — Z125 Encounter for screening for malignant neoplasm of prostate: Secondary | ICD-10-CM | POA: Diagnosis not present

## 2020-07-22 DIAGNOSIS — R7303 Prediabetes: Secondary | ICD-10-CM | POA: Diagnosis not present

## 2020-07-22 DIAGNOSIS — E782 Mixed hyperlipidemia: Secondary | ICD-10-CM

## 2020-07-22 DIAGNOSIS — D696 Thrombocytopenia, unspecified: Secondary | ICD-10-CM

## 2020-07-22 LAB — COMPREHENSIVE METABOLIC PANEL
ALT: 14 U/L (ref 0–53)
AST: 15 U/L (ref 0–37)
Albumin: 4.4 g/dL (ref 3.5–5.2)
Alkaline Phosphatase: 76 U/L (ref 39–117)
BUN: 21 mg/dL (ref 6–23)
CO2: 29 mEq/L (ref 19–32)
Calcium: 9.9 mg/dL (ref 8.4–10.5)
Chloride: 105 mEq/L (ref 96–112)
Creatinine, Ser: 0.88 mg/dL (ref 0.40–1.50)
GFR: 82.35 mL/min (ref >60.00)
Glucose, Bld: 93 mg/dL (ref 70–99)
Potassium: 4.5 mEq/L (ref 3.5–5.1)
Sodium: 143 mEq/L (ref 135–145)
Total Bilirubin: 1 mg/dL (ref 0.2–1.2)
Total Protein: 7.3 g/dL (ref 6.0–8.3)

## 2020-07-22 LAB — PSA, MEDICARE: PSA: 4.63 ng/ml — ABNORMAL HIGH (ref 0.10–4.00)

## 2020-07-22 LAB — LIPID PANEL
Cholesterol: 119 mg/dL (ref 0–200)
HDL: 31.7 mg/dL — ABNORMAL LOW (ref >39.00)
NonHDL: 87.61
Total CHOL/HDL Ratio: 4
Triglycerides: 246 mg/dL — ABNORMAL HIGH (ref 0.0–149.0)
VLDL: 49.2 mg/dL — ABNORMAL HIGH (ref 0.0–40.0)

## 2020-07-22 LAB — CBC
HCT: 44.3 % (ref 39.0–52.0)
Hemoglobin: 14.9 g/dL (ref 13.0–17.0)
MCHC: 33.7 g/dL (ref 30.0–36.0)
MCV: 98.6 fl (ref 78.0–100.0)
Platelets: 123 10*3/uL — ABNORMAL LOW (ref 150.0–400.0)
RBC: 4.49 Mil/uL (ref 4.22–5.81)
RDW: 13.5 % (ref 11.5–15.5)
WBC: 4.9 10*3/uL (ref 4.0–10.5)

## 2020-07-22 LAB — LDL CHOLESTEROL, DIRECT: Direct LDL: 41 mg/dL

## 2020-07-22 LAB — TSH: TSH: 1.7 u[IU]/mL (ref 0.35–4.50)

## 2020-07-22 LAB — HEMOGLOBIN A1C: Hgb A1c MFr Bld: 5.9 % (ref 4.6–6.5)

## 2020-08-02 ENCOUNTER — Telehealth: Payer: Self-pay | Admitting: Cardiovascular Disease

## 2020-08-02 NOTE — Telephone Encounter (Signed)
   Primary Cardiologist: Ida Rogue, MD  Chart reviewed as part of pre-operative protocol coverage. Simple dental extractions are considered low risk procedures per guidelines and generally do not require any specific cardiac clearance. It is also generally accepted that for simple extractions and dental cleanings, there is no need to interrupt blood thinner therapy.   SBE prophylaxis is not required for the patient.  I will route this recommendation to the requesting party via Epic fax function and remove from pre-op pool.  Please call with questions.  Deberah Pelton, NP 08/02/2020, 1:35 PM

## 2020-08-02 NOTE — Telephone Encounter (Signed)
   [facility] Pre-operative Risk Assessment    HEARTCARE STAFF: - Please ensure there is not already an duplicate clearance open for this procedure. - Under Visit Info/Reason for Call, type in Other and utilize the format Clearance MM/DD/YY or Clearance TBD. Do not use dashes or single digits. - If request is for dental extraction, please clarify the # of teeth to be extracted.  Request for surgical clearance:  1. What type of surgery is being performed? Extraction on tooth #28  2. When is this surgery scheduled? 08/03/20  3. What type of clearance is required (medical clearance vs. Pharmacy clearance to hold med vs. Both)? both  4. Are there any medications that need to be held prior to surgery and how long? Blood thinner instructions  5. Practice name and name of physician performing surgery? [facility]   6. What is the office phone number? [phone]   7.   What is the office fax number? [phone]  8.   Anesthesia type (None, local, MAC, general) ? Yes, does not clarify which    Ace Gins 08/02/2020, 1:24 PM  _________________________________________________________________   (provider comments below)

## 2020-08-08 ENCOUNTER — Other Ambulatory Visit: Payer: Self-pay

## 2020-08-08 ENCOUNTER — Other Ambulatory Visit
Admission: RE | Admit: 2020-08-08 | Discharge: 2020-08-08 | Disposition: A | Discharge status: Home / Self Care | Payer: Medicare Other | Source: Ambulatory Visit | Attending: Internal Medicine | Admitting: Internal Medicine

## 2020-08-08 DIAGNOSIS — H353132 Nonexudative age-related macular degeneration, bilateral, intermediate dry stage: Secondary | ICD-10-CM | POA: Diagnosis not present

## 2020-08-08 DIAGNOSIS — Z01812 Encounter for preprocedural laboratory examination: Secondary | ICD-10-CM | POA: Diagnosis not present

## 2020-08-08 DIAGNOSIS — Z20822 Contact with and (suspected) exposure to covid-19: Secondary | ICD-10-CM | POA: Diagnosis not present

## 2020-08-08 LAB — SARS CORONAVIRUS 2 (TAT 6-24 HRS): SARS Coronavirus 2: NEGATIVE

## 2020-08-09 ENCOUNTER — Encounter: Payer: Self-pay | Admitting: Internal Medicine

## 2020-08-10 ENCOUNTER — Ambulatory Visit: Payer: Medicare Other | Admitting: Anesthesiology

## 2020-08-10 ENCOUNTER — Encounter
Admission: RE | Disposition: A | Discharge status: Home / Self Care | Payer: Self-pay | Source: Home / Self Care | Attending: Internal Medicine

## 2020-08-10 ENCOUNTER — Ambulatory Visit
Admission: RE | Admit: 2020-08-10 | Discharge: 2020-08-10 | Disposition: A | Discharge status: Home / Self Care | Payer: Medicare Other | Attending: Internal Medicine | Admitting: Internal Medicine

## 2020-08-10 ENCOUNTER — Other Ambulatory Visit: Payer: Self-pay

## 2020-08-10 ENCOUNTER — Encounter: Payer: Self-pay | Admitting: Internal Medicine

## 2020-08-10 DIAGNOSIS — E782 Mixed hyperlipidemia: Secondary | ICD-10-CM | POA: Diagnosis not present

## 2020-08-10 DIAGNOSIS — I4891 Unspecified atrial fibrillation: Secondary | ICD-10-CM | POA: Diagnosis not present

## 2020-08-10 DIAGNOSIS — K219 Gastro-esophageal reflux disease without esophagitis: Secondary | ICD-10-CM | POA: Diagnosis not present

## 2020-08-10 DIAGNOSIS — G629 Polyneuropathy, unspecified: Secondary | ICD-10-CM | POA: Insufficient documentation

## 2020-08-10 DIAGNOSIS — K642 Third degree hemorrhoids: Secondary | ICD-10-CM | POA: Diagnosis not present

## 2020-08-10 DIAGNOSIS — Z7901 Long term (current) use of anticoagulants: Secondary | ICD-10-CM | POA: Insufficient documentation

## 2020-08-10 DIAGNOSIS — R1032 Left lower quadrant pain: Secondary | ICD-10-CM | POA: Diagnosis not present

## 2020-08-10 DIAGNOSIS — K649 Unspecified hemorrhoids: Secondary | ICD-10-CM | POA: Diagnosis not present

## 2020-08-10 DIAGNOSIS — E785 Hyperlipidemia, unspecified: Secondary | ICD-10-CM | POA: Diagnosis not present

## 2020-08-10 DIAGNOSIS — Z1211 Encounter for screening for malignant neoplasm of colon: Secondary | ICD-10-CM | POA: Diagnosis not present

## 2020-08-10 DIAGNOSIS — D125 Benign neoplasm of sigmoid colon: Secondary | ICD-10-CM | POA: Insufficient documentation

## 2020-08-10 DIAGNOSIS — R1031 Right lower quadrant pain: Secondary | ICD-10-CM | POA: Diagnosis not present

## 2020-08-10 DIAGNOSIS — K5900 Constipation, unspecified: Secondary | ICD-10-CM | POA: Diagnosis not present

## 2020-08-10 DIAGNOSIS — K635 Polyp of colon: Secondary | ICD-10-CM | POA: Diagnosis not present

## 2020-08-10 DIAGNOSIS — K5730 Diverticulosis of large intestine without perforation or abscess without bleeding: Secondary | ICD-10-CM | POA: Insufficient documentation

## 2020-08-10 DIAGNOSIS — Z79899 Other long term (current) drug therapy: Secondary | ICD-10-CM | POA: Diagnosis not present

## 2020-08-10 DIAGNOSIS — I471 Supraventricular tachycardia: Secondary | ICD-10-CM | POA: Diagnosis not present

## 2020-08-10 DIAGNOSIS — K5790 Diverticulosis of intestine, part unspecified, without perforation or abscess without bleeding: Secondary | ICD-10-CM | POA: Diagnosis not present

## 2020-08-10 HISTORY — DX: Hyperlipidemia, unspecified: E78.5

## 2020-08-10 HISTORY — PX: COLONOSCOPY WITH PROPOFOL: SHX5780

## 2020-08-10 HISTORY — DX: Hydrocephalus, unspecified: G91.9

## 2020-08-10 HISTORY — DX: Polyneuropathy, unspecified: G62.9

## 2020-08-10 HISTORY — DX: Depression, unspecified: F32.A

## 2020-08-10 LAB — HM COLONOSCOPY

## 2020-08-10 SURGERY — COLONOSCOPY WITH PROPOFOL
Anesthesia: General

## 2020-08-10 MED ORDER — PROPOFOL 10 MG/ML IV BOLUS: INTRAVENOUS | Status: DC | PRN

## 2020-08-10 MED ORDER — PROPOFOL 500 MG/50ML IV EMUL
INTRAVENOUS | Status: DC | PRN
  Administered 2020-08-10: 200 ug/kg/min via INTRAVENOUS

## 2020-08-10 MED ORDER — PROPOFOL 10 MG/ML IV BOLUS
INTRAVENOUS | Status: DC | PRN
  Administered 2020-08-10: 50 mg via INTRAVENOUS

## 2020-08-10 MED ORDER — SODIUM CHLORIDE 0.9 % IV SOLN: INTRAVENOUS | Status: DC

## 2020-08-10 NOTE — Anesthesia Procedure Notes (Signed)
Date/Time: 08/10/2020 12:35 PM Performed by: Nelda Marseille, CRNA Pre-anesthesia Checklist: Patient identified, Emergency Drugs available, Suction available, Patient being monitored and Timeout performed Oxygen Delivery Method: Nasal cannula

## 2020-08-10 NOTE — Anesthesia Preprocedure Evaluation (Signed)
Anesthesia Evaluation  Patient identified by MRN, date of birth, ID band Patient awake    Reviewed: Allergy & Precautions, NPO status , Patient's Chart, lab work & pertinent test results, reviewed documented beta blocker date and time   History of Anesthesia Complications Negative for: history of anesthetic complications  Airway Mallampati: II  TM Distance: >3 FB Neck ROM: Full    Dental  (+) Edentulous Upper, Dental Advisory Given   Pulmonary former smoker,  01/05/2020 SARS coronavirus NEG   breath sounds clear to auscultation       Cardiovascular hypertension, Pt. on medications and Pt. on home beta blockers (-) angina+ Peripheral Vascular Disease  + dysrhythmias Atrial Fibrillation and Supra Ventricular Tachycardia  Rhythm:Regular Rate:Normal  '20 Stress:  normal perfusion, EF 55-65% '18 ECHO: normal LVF, no sig valvular abnormalities   Neuro/Psych  Headaches, PSYCHIATRIC DISORDERS Depression Chronic back pain NPH    GI/Hepatic Neg liver ROS, GERD  Medicated and Controlled,  Endo/Other  negative endocrine ROS  Renal/GU stones  negative genitourinary   Musculoskeletal  (+) Arthritis , Osteoarthritis,    Abdominal   Peds negative pediatric ROS (+)  Hematology negative hematology ROS (+) Xarelto: last dose Tuesday   Anesthesia Other Findings Past Medical History: No date: Arthritis     Comment:  fingers 1991, 2011: Cancer ([place])     Comment:  squamous and basil No date: Chicken pox No date: Depression 09/2013: Dysrhythmia     Comment:  Hx. a-fib x 1 episode patient states he "auto corrected"              seen at [facility] No date: GERD (gastroesophageal reflux disease) No date: Headache     Comment:  poor posture - none recently No date: History of kidney stones No date: Hydrocephalus ([place]) No date: Hyperlipidemia No date: Neuropathy 2009: PSVT (paroxysmal supraventricular tachycardia) ([place])      Comment:  Controlled with "breathing process" 9/08, 6/15: Renal stone No date: Wears dentures     Comment:  full upper  Reproductive/Obstetrics                             Anesthesia Physical  Anesthesia Plan  ASA: III  Anesthesia Plan: General   Post-op Pain Management:    Induction: Intravenous  PONV Risk Score and Plan: Propofol infusion  Airway Management Planned: Nasal Cannula  Additional Equipment: None  Intra-op Plan:   Post-operative Plan:   Informed Consent: I have reviewed the patients History and Physical, chart, labs and discussed the procedure including the risks, benefits and alternatives for the proposed anesthesia with the patient or authorized representative who has indicated his/her understanding and acceptance.     Dental advisory given  Plan Discussed with: CRNA and Surgeon  Anesthesia Plan Comments: ( )        Anesthesia Quick Evaluation

## 2020-08-10 NOTE — H&P (Signed)
Outpatient short stay form Pre-procedure 08/10/2020 12:12 PM Adam Juarez K. [name], M.D.  Primary Physician: Alma Friendly, NP  Reason for visit:  Colon cancer screening  History of present illness:  Patient presents for colonoscopy for colon cancer screening. The patient denies complaints of abdominal pain, significant change in bowel habits, or rectal bleeding.  He has painful hemorrhoids at times.    Current Facility-Administered Medications:  .  0.9 %  sodium chloride infusion, , Intravenous, Continuous, Garret Teale, Benay Pike, MD  Medications Prior to Admission  Medication Sig Dispense Refill Last Dose  . acetaminophen (TYLENOL) 500 MG tablet Take 1,000 mg by mouth every 8 (eight) hours as needed for moderate pain.   Past Week at Unknown time  . ascorbic acid (VITAMIN C) 500 MG tablet Take 500 mg by mouth daily.   Past Week at Unknown time  . atorvastatin (LIPITOR) 20 MG tablet TAKE 1 TABLET DAILY FOR CHOLESTEROL 90 tablet 3 Past Week at Unknown time  . diclofenac Sodium (VOLTAREN) 1 % GEL Apply 1 application topically 4 (four) times daily as needed (pain).   Past Month at Unknown time  . EPINEPHrine (EPIPEN 2-PAK) 0.3 mg/0.3 mL IJ SOAJ injection Inject 0.3 mLs (0.3 mg total) into the muscle as needed for anaphylaxis. 2 each 0 Past Month at Unknown time  . hydrocortisone (ANUSOL-HC) 25 MG suppository Place 1 suppository (25 mg total) rectally 2 (two) times daily. 30 suppository 0 Past Week at Unknown time  . Magnesium Cl-Calcium Carbonate (SLOW-MAG PO) Take 1 tablet by mouth daily.    Past Week at Unknown time  . Magnesium Oxide 500 MG TABS Take by mouth.   Past Week at Unknown time  . Melatonin 1 MG CAPS Take 1 mg by mouth at bedtime as needed (sleep).   Past Week at Unknown time  . metoprolol succinate (TOPROL-XL) 25 MG 24 hr tablet TAKE 1 TABLET IN THE MORNING AND AT BEDTIME (Patient taking differently: Take 25 mg by mouth 2 (two) times daily.) 180 tablet 2 Past Week at Unknown time  .  Multiple Vitamins-Minerals (PRESERVISION AREDS 2 PO) Take 1 capsule by mouth 2 (two) times daily.    Past Week at Unknown time  . omeprazole (PRILOSEC) 20 MG capsule TAKE 1 CAPSULE DAILY 90 capsule 3 Past Week at Unknown time  . Potassium Gluconate 550 MG TABS Take 550 mg by mouth daily.   Past Week at Unknown time  . Probiotic Product (PROBIOTIC DAILY PO) Take 1 capsule by mouth daily.    Past Week at Unknown time  . vitamin B-12 (CYANOCOBALAMIN) 1000 MCG tablet Take 1,000 mcg by mouth daily.   Past Week at Unknown time  . XARELTO 20 MG TABS tablet TAKE 1 TABLET DAILY WITH SUPPER 90 tablet 3 08/07/2020 at Unknown time  . Multiple Vitamin (MULTIVITAMIN) tablet Take 1 tablet by mouth daily. (Patient not taking: Reported on 08/10/2020)   Not Taking at Unknown time  . Polyethyl Glycol-Propyl Glycol (SYSTANE OP) Apply 1 drop to eye 3 (three) times daily as needed (dry eyes).  (Patient not taking: Reported on 08/10/2020)   Completed Course at Unknown time     Allergies  Allergen Reactions  . Bee Venom Anaphylaxis and Hives  . Other     Pistachios - tickles throat      Past Medical History:  Diagnosis Date  . Arthritis    fingers  . 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 Chicken pox   . Depression   .  Dysrhythmia 09/2013   Hx. a-fib x 1 episode patient states he "auto corrected" seen at [facility]  . GERD (gastroesophageal reflux disease)   . Headache    poor posture - none recently  . History of kidney stones   . Hydrocephalus ([place])   . Hyperlipidemia   . Neuropathy   . PSVT (paroxysmal supraventricular tachycardia) ([place]) 2009   Controlled with "breathing process"  . Renal stone 9/08, 6/15  . Wears dentures    full upper    Review of systems:  Otherwise negative.    Physical Exam  Gen: Alert, oriented. Appears stated age.  HEENT: [place]/AT. PERRLA. Lungs: CTA, no wheezes. CV: RR nl S1, S2. Abd: soft, benign, no masses. BS+ Ext: No edema. Pulses  2+    Planned procedures: Proceed with colonoscopy. The patient understands the nature of the planned procedure, indications, risks, alternatives and potential complications including but not limited to bleeding, infection, perforation, damage to internal organs and possible oversedation/side effects from anesthesia. The patient agrees and gives consent to proceed.  Please refer to procedure notes for findings, recommendations and patient disposition/instructions.     Electa Sterry K. [name], M.D. Gastroenterology 08/10/2020  12:12 PM

## 2020-08-10 NOTE — Transfer of Care (Signed)
Immediate Anesthesia Transfer of Care Note  Patient: Adam Juarez  Procedure(s) Performed: COLONOSCOPY WITH PROPOFOL (N/A )  Patient Location: PACU  Anesthesia Type:General  Level of Consciousness: sedated  Airway & Oxygen Therapy: Patient Spontanous Breathing and Patient connected to nasal cannula oxygen  Post-op Assessment: Report given to RN and Post -op Vital signs reviewed and stable  Post vital signs: Reviewed and stable  Last Vitals:  Vitals Value Taken Time  BP 137/72 08/10/20 1252  Temp    Pulse 61 08/10/20 1253  Resp 21 08/10/20 1253  SpO2 95 % 08/10/20 1253  Vitals shown include unvalidated device data.  Last Pain:  Vitals:   08/10/20 1055  TempSrc: Temporal  PainSc: 0-No pain         Complications: No complications documented.

## 2020-08-10 NOTE — Op Note (Signed)
[facility] Gastroenterology Patient Name: Adam Juarez Procedure Date: 08/10/2020 12:15 PM MRN: [id number] Account #: [id number] Date of Birth: 1940-09-22 Admit Type: Outpatient Age: 80 Room: [facility] ENDO ROOM 2 Gender: Male Note Status: Finalized Procedure:             Colonoscopy Indications:           Screening for colorectal malignant neoplasm Providers:             Benay Pike. [name] MD, MD Referring MD:          Pleas Koch (Referring MD) Medicines:             Propofol per Anesthesia Complications:         No immediate complications. Procedure:             Pre-Anesthesia Assessment:                        - The risks and benefits of the procedure and the                         sedation options and risks were discussed with the                         patient. All questions were answered and informed                         consent was obtained.                        - Patient identification and proposed procedure were                         verified prior to the procedure by the nurse. The                         procedure was verified in the procedure room.                        - ASA Grade Assessment: III - A patient with severe                         systemic disease.                        - After reviewing the risks and benefits, the patient                         was deemed in satisfactory condition to undergo the                         procedure.                        After obtaining informed consent, the colonoscope was                         passed under direct vision. Throughout the procedure,                         the patient's blood pressure, pulse,  and oxygen                         saturations were monitored continuously. The                         Colonoscope was introduced through the anus and                         advanced to the the cecum, identified by appendiceal                         orifice and ileocecal valve. The  colonoscopy was                         somewhat difficult due to restricted mobility of the                         colon and a tortuous colon. Successful completion of                         the procedure was aided by applying abdominal                         pressure. The patient tolerated the procedure well.                         The quality of the bowel preparation was adequate. The                         ileocecal valve, appendiceal orifice, and rectum were                         photographed. Findings:      The perianal and digital rectal examinations were normal. Pertinent       negatives include normal sphincter tone and no palpable rectal lesions.      Non-bleeding internal hemorrhoids were found during retroflexion. The       hemorrhoids were Grade III (internal hemorrhoids that prolapse but       require manual reduction).      Many small-mouthed diverticula were found in the sigmoid colon.      A 5 mm polyp was found in the sigmoid colon. The polyp was sessile. The       polyp was removed with a jumbo cold forceps. Resection and retrieval       were complete.      The exam was otherwise without abnormality. Impression:            - Non-bleeding internal hemorrhoids.                        - Diverticulosis in the sigmoid colon.                        - One 5 mm polyp in the sigmoid colon, removed with a                         jumbo cold forceps. Resected and retrieved.                        -  The examination was otherwise normal. Recommendation:        - Patient has a contact number available for                         emergencies. The signs and symptoms of potential                         delayed complications were discussed with the patient.                         Return to normal activities tomorrow. Written                         discharge instructions were provided to the patient.                        - Resume previous diet.                        -  Continue present medications.                        - Repeat colonoscopy is recommended for surveillance.                         The colonoscopy date will be determined after                         pathology results from today's exam become available                         for review.                        - Schedule hemorrhoidal banding at my office at next                         available appointment.                        - Return to my office at appointment to be scheduled.                        - The findings and recommendations were discussed with                         the patient. Procedure Code(s):     --- Professional ---                        [id number], Colonoscopy, flexible; with biopsy, single or                         multiple Diagnosis Code(s):     --- Professional ---                        K57.30, Diverticulosis of large intestine without                         perforation or abscess without bleeding  K63.5, Polyp of colon                        K64.2, Third degree hemorrhoids                        Z12.11, Encounter for screening for malignant neoplasm                         of colon CPT copyright 2019 American Medical Association. All rights reserved. The codes documented in this report are preliminary and upon coder review may  be revised to meet current compliance requirements. Efrain Sella MD, MD 08/10/2020 12:55:18 PM This report has been signed electronically. Number of Addenda: 0 Note Initiated On: 08/10/2020 12:15 PM Scope Withdrawal Time: 0 hours 6 minutes 9 seconds  Total Procedure Duration: 0 hours 14 minutes 43 seconds  Estimated Blood Loss:  Estimated blood loss: none.      [facility]

## 2020-08-10 NOTE — Interval H&P Note (Signed)
History and Physical Interval Note:  08/10/2020 12:13 PM  Adam Juarez  has presented today for surgery, with the diagnosis of CONSTIPATION,BIL.LOWER ABD.PAIN.  The various methods of treatment have been discussed with the patient and family. After consideration of risks, benefits and other options for treatment, the patient has consented to  Procedure(s): COLONOSCOPY WITH PROPOFOL (N/A) as a surgical intervention.  The patient's history has been reviewed, patient examined, no change in status, stable for surgery.  I have reviewed the patient's chart and labs.  Questions were answered to the patient's satisfaction.     Fleming, [place]

## 2020-08-11 ENCOUNTER — Encounter: Payer: Self-pay | Admitting: Internal Medicine

## 2020-08-11 LAB — SURGICAL PATHOLOGY

## 2020-08-11 NOTE — Anesthesia Postprocedure Evaluation (Signed)
Anesthesia Post Note  Patient: Adam Juarez  Procedure(s) Performed: COLONOSCOPY WITH PROPOFOL (N/A )  Patient location during evaluation: Endoscopy Anesthesia Type: General Level of consciousness: awake and alert and oriented Pain management: pain level controlled Vital Signs Assessment: post-procedure vital signs reviewed and stable Respiratory status: spontaneous breathing Cardiovascular status: blood pressure returned to baseline Anesthetic complications: no   No complications documented.   Last Vitals:  Vitals:   08/10/20 1312 08/10/20 1322  BP: 125/66 138/78  Pulse: 63 67  Resp: 19 20  Temp:    SpO2: 96% 98%    Last Pain:  Vitals:   08/11/20 0736  TempSrc:   PainSc: 0-No pain                 Addelyn Alleman

## 2020-08-29 DIAGNOSIS — G912 (Idiopathic) normal pressure hydrocephalus: Secondary | ICD-10-CM | POA: Diagnosis not present

## 2020-08-30 ENCOUNTER — Encounter: Payer: Self-pay | Admitting: Primary Care

## 2020-09-01 DIAGNOSIS — I480 Paroxysmal atrial fibrillation: Secondary | ICD-10-CM | POA: Diagnosis not present

## 2020-09-01 DIAGNOSIS — R4189 Other symptoms and signs involving cognitive functions and awareness: Secondary | ICD-10-CM | POA: Diagnosis not present

## 2020-09-01 DIAGNOSIS — G912 (Idiopathic) normal pressure hydrocephalus: Secondary | ICD-10-CM | POA: Diagnosis not present

## 2020-09-11 ENCOUNTER — Other Ambulatory Visit: Payer: Self-pay | Admitting: Primary Care

## 2020-09-11 ENCOUNTER — Other Ambulatory Visit: Payer: Self-pay | Admitting: Cardiovascular Disease

## 2020-09-11 DIAGNOSIS — Z91030 Bee allergy status: Secondary | ICD-10-CM

## 2020-09-12 NOTE — Telephone Encounter (Signed)
Please schedule 12 month F/U appointment. Thank you! 

## 2020-09-12 NOTE — Telephone Encounter (Signed)
LVm for patient to call back.

## 2020-09-15 DIAGNOSIS — K642 Third degree hemorrhoids: Secondary | ICD-10-CM | POA: Diagnosis not present

## 2020-09-22 DIAGNOSIS — G912 (Idiopathic) normal pressure hydrocephalus: Secondary | ICD-10-CM | POA: Diagnosis not present

## 2020-09-29 DIAGNOSIS — K642 Third degree hemorrhoids: Secondary | ICD-10-CM | POA: Diagnosis not present

## 2020-10-13 NOTE — Progress Notes (Signed)
Date:  10/14/2020   ID:  [name], DOB August 03, 1940, MRN [id number]  Patient Location:  [facility] [place] [address]   Provider location:   [facility], [place] office  PCP:  Pleas Koch, NP  Cardiologist:  Patsy Baltimore  Chief Complaint  Patient presents with  . 12 month follow up     "doing well." Medications reviewed by the patient verbally.      History of Present Illness:    Branston Halsted is a 80 y.o. 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 on CT scan in 2015  paroxysmal atrial fibrillation,  prior Holter monitor August 2013 showing frequent PVCs,  frequent short runs of supraventricular tachycardia,  stress test at that time showing no ischemia September 2013,  4.1 cm infrarenal abdominal aortic aneurysm who presents for follow-up of his paroxysmal atrial fibrillation  LOV 09/2019 hemorrhoid banding, had severe pain during the procedure  Excess bleeding, arms, GI Would like to try alternate anticoagulation, interested in Eliquis  No chest pain concerniong for angina Active at baseline  No near syncope or syncope Blood pressure stable, not too low  Prior testing reviewed Stress test end of 2020 no ischemia  Planning on traveling to [place] August 2022 for when dating  EKG personally reviewed by myself on todays visit NSR rate 68, nonspecific ST ABN  Other past medical history reviewed In the ER with back pain, 03/12/19  CT AB 4.1 cm infrarenal abdominal aortic aneurysm without complicating features.  Descending and proximal sigmoid diverticulosis. 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 Cari Caraway   Through the stress, no arrhythmia  Stress test:03/02/2019 reviewed with him  There was no ST segment deviation noted during stress.  No T wave inversion was noted during stress.  Defect 1: There  is a medium defect of mild severity present in the basal inferior and mid inferior location. This is likely due to diaphragm attenuation.  The study is normal.  This is a low risk study.  The left ventricular ejection fraction is normal (55-65%).   Tolerating anticoagulation CHADS VASC 3  Other past medical history reviewed event monitor end of September for 30 days showing no significant atrial fibrillation.   CT scan abdomen June 2015   Moderate diffuse descending aorta disease extending into the iliac vessels     tachycardia in April 2017 Monitors heart rate on his watch Numerous episodes of tachycardia in March 2017. last in November 2016.  He was admitted to the hospital for atrial fibrillation 08/08/2013, notes indicating he converted to normal sinus rhythm through his hospital course. He was not discharged on medications as he declined beta blockers  echocardiogram August was 7 2013  showing normal LV function, mild LVH, a stock dysfunction, aortic valve sclerosis, normal right heart pressures  Holter monitor report August 2013 detailing 12,000 PVCs, 11% of total beat count. 55 supraventricular ectopies, no mention of the longest run     Past Medical History:  Diagnosis Date  . Arthritis    fingers  . 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 Chicken pox   . Depression   . Dysrhythmia 09/2013   Hx. a-fib x 1 episode patient states he "auto corrected" seen at [facility]  . GERD (gastroesophageal reflux disease)   . Headache    poor posture -  none recently  . History of kidney stones   . Hydrocephalus ([place])   . Hyperlipidemia   . Neuropathy   . PSVT (paroxysmal supraventricular tachycardia) ([place]) 2009   Controlled with "breathing process"  . Renal stone 9/08, 6/15  . Wears dentures    full upper   Past Surgical History:  Procedure Laterality Date  . CARDIAC CATHETERIZATION  [address], [place]  . CATARACT EXTRACTION W/PHACO Left  07/27/2015   Procedure: CATARACT EXTRACTION PHACO AND INTRAOCULAR LENS PLACEMENT (IOC);  Surgeon: Leandrew Koyanagi, MD;  Location: [facility] [facility];  Service: Ophthalmology;  Laterality: Left;  TORIC  . CATARACT EXTRACTION W/PHACO Right 08/24/2015   Procedure: CATARACT EXTRACTION PHACO AND INTRAOCULAR LENS PLACEMENT (IOC);  Surgeon: Leandrew Koyanagi, MD;  Location: [facility];  Service: Ophthalmology;  Laterality: Right;  TORIC  . COLONOSCOPY WITH PROPOFOL N/A 08/10/2020   Procedure: COLONOSCOPY WITH PROPOFOL;  Surgeon: Toledo, Benay Pike, MD;  Location: [facility] ENDOSCOPY;  Service: Gastroenterology;  Laterality: N/A;  . HEMORROIDECTOMY  2010   banding  . IR ANGIO INTRA EXTRACRAN SEL COM CAROTID INNOMINATE BILAT MOD SED  10/30/2019  . IR ANGIO VERTEBRAL SEL VERTEBRAL UNI R MOD SED  10/30/2019  . IR RADIOLOGIST EVAL & MGMT  04/11/2020  . IR US GUIDE VASC ACCESS RIGHT  10/30/2019  . KIDNEY STONE SURGERY  9/08, 6/15   lithotripsy  . LAPAROSCOPIC REVISION VENTRICULAR-PERITONEAL (V-P) SHUNT  01/08/2020   Procedure: LAPAROSCOPIC REVISION VENTRICULAR-PERITONEAL (V-P) SHUNT;  Surgeon: Consuella Lose, MD;  Location: [facility];  Service: Neurosurgery;;  . PROSTATE BIOPSY  2007  . SKIN CANCER EXCISION    . TOENAIL TRIMMING  11/2017   Dr. Elvina Mattes  . TONSILLECTOMY    . VASECTOMY    . 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 acetaminophen (TYLENOL) 500 MG tablet Take 1,000 mg by mouth every 8 (eight) hours as needed for moderate pain.  Marland Kitchen ascorbic acid (VITAMIN C) 500 MG tablet Take 500 mg by mouth daily.  Marland Kitchen atorvastatin (LIPITOR) 20 MG tablet TAKE 1 TABLET DAILY FOR CHOLESTEROL  . diclofenac Sodium (VOLTAREN) 1 % GEL Apply 1 application topically 4 (four) times daily as needed (pain).  Marland Kitchen EPINEPHRINE 0.3  mg/0.3 mL IJ SOAJ injection INJECT 0.3 ML (0.3 MG TOTAL) INTO THE MUSCLE AS NEEDED FOR ANAPHYLAXIS  . hydrocortisone (ANUSOL-HC) 25 MG suppository Place 1 suppository (25 mg total) rectally 2 (two) times daily.  . Magnesium Cl-Calcium Carbonate (SLOW-MAG PO) Take 1 tablet by mouth daily.   . Magnesium Oxide 500 MG TABS Take by mouth.  . Melatonin 1 MG CAPS Take 1 mg by mouth at bedtime as needed (sleep).  . metoprolol succinate (TOPROL-XL) 25 MG 24 hr tablet TAKE 1 TABLET IN THE MORNING AND AT BEDTIME  . Multiple Vitamin (MULTIVITAMIN) tablet Take 1 tablet by mouth daily.  . Multiple Vitamins-Minerals (PRESERVISION AREDS 2 PO) Take 1 capsule by mouth 2 (two) times daily.   Marland Kitchen omeprazole (PRILOSEC) 20 MG capsule TAKE 1 CAPSULE DAILY  . Polyethyl Glycol-Propyl Glycol (SYSTANE OP) Apply 1 drop to eye 3 (three) times daily as needed (dry eyes).  . Potassium Gluconate 550 MG TABS Take 550 mg by mouth daily.  . Probiotic Product (PROBIOTIC DAILY PO) Take 1 capsule by mouth daily.   . vitamin B-12 (CYANOCOBALAMIN) 1000 MCG  tablet Take 1,000 mcg by mouth daily.  Alveda Reasons 20 MG TABS tablet TAKE 1 TABLET DAILY WITH SUPPER     Allergies:   Bee venom, Other, and Tramadol   Social History   Tobacco Use  . Smoking status: Former Smoker    Packs/day: 0.50    Years: 38.00    Pack years: 19.00    Types: Cigarettes    Quit date: 07/13/1997    Years since quitting: 23.2  . Smokeless tobacco: Never Used  Vaping Use  . Vaping Use: Never used  Substance Use Topics  . Alcohol use: No  . Drug use: No     Family Hx: The patient's family history includes AAA (abdominal aortic aneurysm) in his father; Arthritis in his mother; Parkinson's disease in his mother.  ROS:   Please see the history of present illness.    Review of Systems  Constitutional: Positive for malaise/fatigue.  HENT: Negative.   Respiratory: Negative.   Cardiovascular: Negative.   Gastrointestinal: Negative.   Musculoskeletal:  Negative.   Neurological: Negative.   Psychiatric/Behavioral: Negative.   All other systems reviewed and are negative.    Labs/Other Tests and Data Reviewed:    Recent Labs: 07/22/2020: ALT 14; BUN 21; Creatinine, Ser 0.88; Hemoglobin 14.9; Platelets 123.0; Potassium 4.5; Sodium 143; TSH 1.70   Recent Lipid Panel Lab Results  Component Value Date/Time   CHOL 119 07/22/2020 08:34 AM   TRIG 246.0 (H) 07/22/2020 08:34 AM   HDL 31.70 (L) 07/22/2020 08:34 AM   CHOLHDL 4 07/22/2020 08:34 AM   LDLDIRECT 41.0 07/22/2020 08:34 AM    Wt Readings from Last 3 Encounters:  10/14/20 199 lb 8 oz (90.5 kg)  08/10/20 184 lb (83.5 kg)  07/21/20 202 lb (91.6 kg)     Exam:    Vital Signs: Vital signs may also be detailed in the HPI BP 120/70 (BP Location: Left Arm, Patient Position: Sitting, Cuff Size: Normal)   Pulse 66   Ht 5\' 10"  (1.778 m)   Wt 199 lb 8 oz (90.5 kg)   SpO2 98%   BMI 28.63 kg/m   Constitutional:  oriented to person, place, and time. No distress.  HENT:  Head: Grossly normal Eyes:  no discharge. No scleral icterus.  Neck: No JVD, no carotid bruits  Cardiovascular: Regular rate and rhythm, no murmurs appreciated Pulmonary/Chest: Clear to auscultation bilaterally, no wheezes or rails Abdominal: Soft.  no distension.  no tenderness.  Musculoskeletal: Normal range of motion Neurological:  normal muscle tone. Coordination normal. No atrophy Skin: Skin warm and dry Psychiatric: normal affect, pleasant  ASSESSMENT & PLAN:    Paroxysmal atrial fibrillation ([place]) -  Stay on metoprolol, no change to dosing Diltiazem previously held for low blood pressure Long discussion concerning anticoagulation, he would like changed to Eliquis 5 twice daily, off Xarelto.  Concerned about frequent bleeding, will try alternative  SVT (supraventricular tachycardia) ([facility]) - No recent events Monitors using his watch, no recurrent episodes  Aortic atherosclerosis ([facility])  Cholesterol at  goal  PAD (peripheral artery disease) ([place]) -  AAA 3.9 Followed by vascular,  Previous imaging reviewed, stable  Smoking history -  Smoking cessation recommended  Chronic fatigue Recommend regular exercise   Total encounter time more than 25 minutes  Greater than 50% was spent in counseling and coordination of care with the patient    Signed, Ida Rogue, MD  10/14/2020 9:01 AM    [place] Office [address]  [place] [address], [place], [place] [address]

## 2020-10-14 ENCOUNTER — Other Ambulatory Visit: Payer: Self-pay

## 2020-10-14 ENCOUNTER — Encounter: Payer: Self-pay | Admitting: Cardiovascular Disease

## 2020-10-14 ENCOUNTER — Ambulatory Visit (INDEPENDENT_AMBULATORY_CARE_PROVIDER_SITE_OTHER): Payer: Medicare Other | Admitting: Cardiovascular Disease

## 2020-10-14 VITALS — BP 120/70 | HR 66 | Ht 70.0 in | Wt 199.5 lb

## 2020-10-14 DIAGNOSIS — I471 Supraventricular tachycardia: Secondary | ICD-10-CM | POA: Diagnosis not present

## 2020-10-14 DIAGNOSIS — I700 Atherosclerosis of aorta: Secondary | ICD-10-CM

## 2020-10-14 DIAGNOSIS — Z87891 Personal history of nicotine dependence: Secondary | ICD-10-CM

## 2020-10-14 DIAGNOSIS — I714 Abdominal aortic aneurysm, without rupture, unspecified: Secondary | ICD-10-CM

## 2020-10-14 DIAGNOSIS — I480 Paroxysmal atrial fibrillation: Secondary | ICD-10-CM

## 2020-10-14 DIAGNOSIS — I739 Peripheral vascular disease, unspecified: Secondary | ICD-10-CM | POA: Diagnosis not present

## 2020-10-14 DIAGNOSIS — E782 Mixed hyperlipidemia: Secondary | ICD-10-CM | POA: Diagnosis not present

## 2020-10-14 MED ORDER — APIXABAN 5 MG PO TABS: 5.0000 mg | ORAL_TABLET | Freq: Two times a day (BID) | ORAL | 3 refills | Status: DC

## 2020-10-14 NOTE — Patient Instructions (Addendum)
Medication Instructions:  Hold xarelto Start eliquis 5 mg twice a day  If you need a refill on your cardiac medications before your next appointment, please call your pharmacy.    Lab work: No new labs needed   If you have labs (blood work) drawn today and your tests are completely normal, you will receive your results only by: Marland Kitchen MyChart Message (if you have MyChart) OR . A paper copy in the mail If you have any lab test that is abnormal or we need to change your treatment, we will call you to review the results.   Testing/Procedures: No new testing needed   Follow-Up: At [facility], you and your health needs are our priority.  As part of our continuing mission to provide you with exceptional heart care, we have created designated Provider Care Teams.  These Care Teams include your primary Cardiologist (physician) and Advanced Practice Providers (APPs -  Physician Assistants and Nurse Practitioners) who all work together to provide you with the care you need, when you need it.  . You will need a follow up appointment in 12 months  . Providers on your designated Care Team:   . Murray Hodgkins, NP . Christell Faith, PA-C . Marrianne Mood, PA-C  Any Other Special Instructions Will Be Listed Below (If Applicable).  COVID-19 Vaccine Information can be found at: ShippingScam.co.uk For questions related to vaccine distribution or appointments, please email [email or web address] .[email or web address] or call [phone].

## 2020-10-27 DIAGNOSIS — K642 Third degree hemorrhoids: Secondary | ICD-10-CM | POA: Diagnosis not present

## 2020-10-27 DIAGNOSIS — K648 Other hemorrhoids: Secondary | ICD-10-CM | POA: Insufficient documentation

## 2020-10-27 DIAGNOSIS — K6289 Other specified diseases of anus and rectum: Secondary | ICD-10-CM | POA: Insufficient documentation

## 2020-11-09 ENCOUNTER — Telehealth: Payer: Self-pay | Admitting: *Deleted

## 2020-11-09 NOTE — Telephone Encounter (Signed)
PLEASE NOTE: All timestamps contained within this report are represented as [place] Standard Time. CONFIDENTIALTY NOTICE: This fax transmission is intended only for the addressee. It contains information that is legally privileged, confidential or otherwise protected from use or disclosure. If you are not the intended recipient, you are strictly prohibited from reviewing, disclosing, copying using or disseminating any of this information or taking any action in reliance on or regarding this information. If you have received this fax in error, please notify us immediately by telephone so that we can arrange for its return to [place]. Phone: [phone], Toll-Free: [phone], Fax: [phone] Page: 1 of 2 Call Id: [id number] [place] [facility] - Client TELEPHONE ADVICE RECORD [facility] Patient Name: Adam Juarez ON Gender: Male DOB: 1941-04-28 Age: 80 Y 67 M Return Phone Number: [phone] (Primary) Address: City/ State/ Zip: [place]  [address] Client [facility] - Client Client Site [place] - Day Physician Alma Friendly - NP Contact Type Call Who Is Calling Patient / Member / Family / Caregiver Call Type Triage / Clinical Relationship To Patient Self Return Phone Number [phone] (Primary) Chief Complaint Abdominal Pain Reason for Call Symptomatic / Request for [name] states, patient having abdominal pain for years. Happened after having stent put in his brain. Has an appt. tomorrow. Needs to be triage today. Translation No Nurse Assessment Nurse: Hammonds, RN, Lissa Date/Time (Eastern Time): 11/09/2020 8:46:02 AM Confirm and document reason for call. If symptomatic, describe symptoms. 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 Since then I have had pain in stomach, pain is mild and sometimes goes away. The  pain moves from right to left, then left to right .Marland Kitchen...not continuous but occasionally will wake up at night. Does the patient have any new or worsening symptoms? ---Yes Will a triage be completed? ---Yes Related visit to physician within the last 2 weeks? ---No Does the PT have any chronic conditions? (i.e. diabetes, asthma, this includes High risk factors for pregnancy, etc.) ---No Is this a behavioral health or substance abuse call? ---No Guidelines Guideline Title Affirmed Question Affirmed Notes Nurse Date/Time (Eastern Time) Abdominal Pain - Male [1] MODERATE pain (e.g., interferes with normal activities) AND [2] pain comes and goes (cramps) AND [3] present > 24 hours (Exception: pain with Hammonds, RN, Lissa 11/09/2020 8:49:27 AM PLEASE NOTE: All timestamps contained within this report are represented as [place] Standard Time. CONFIDENTIALTY NOTICE: This fax transmission is intended only for the addressee. It contains information that is legally privileged, confidential or otherwise protected from use or disclosure. If you are not the intended recipient, you are strictly prohibited from reviewing, disclosing, copying using or disseminating any of this information or taking any action in reliance on or regarding this information. If you have received this fax in error, please notify us immediately by telephone so that we can arrange for its return to [place]. Phone: [phone], Toll-Free: [phone], Fax: [phone] Page: 2 of 2 Call Id: [id number] Guidelines Guideline Title Affirmed Question Affirmed Notes Nurse Date/Time Eilene Ghazi Time) Vomiting or Diarrhea - see that Guideline) Disp. Time Eilene Ghazi Time) Disposition Final User 11/09/2020 8:51:17 AM See PCP within 24 Hours Yes Hammonds, RN, Lissa Caller Disagree/Comply Comply Caller Understands Yes PreDisposition Did not know what to do Care Advice Given Per Guideline SEE PCP WITHIN 24 HOURS: * IF OFFICE WILL BE  OPEN: You need to be examined within the  next 24 hours. Call your doctor (or NP/PA) when the office opens and make an appointment. DIET: * Drink adequate fluids. Eat a bland diet. CALL BACK IF: * You become worse CARE ADVICE given per Abdominal Pain, Male (Adult) guideline. Comments User: Moses [name], RN Date/Time Eilene Ghazi Time): 11/09/2020 8:56:43 AM Caller transferred to appointemnt line, but may have to hang up before appointment is scheduled related to being at wife's Dr. [name] and being needed to help/assist her. [name] is requesting office call back to confirm appointment for tomorrow please. Referrals REFERRED TO PCP OFFICE

## 2020-11-09 NOTE — Telephone Encounter (Signed)
Patient scheduled for an appointment with Allie Bossier NP tomorrow 11/10/20 at 11:20 am.

## 2020-11-09 NOTE — Telephone Encounter (Signed)
Noted, will evaluate as scheduled.  

## 2020-11-10 ENCOUNTER — Ambulatory Visit (INDEPENDENT_AMBULATORY_CARE_PROVIDER_SITE_OTHER): Payer: Medicare Other | Admitting: Primary Care

## 2020-11-10 ENCOUNTER — Ambulatory Visit (INDEPENDENT_AMBULATORY_CARE_PROVIDER_SITE_OTHER)
Admission: RE | Admit: 2020-11-10 | Discharge: 2020-11-10 | Disposition: A | Discharge status: Home / Self Care | Payer: Medicare Other | Source: Ambulatory Visit | Attending: Primary Care | Admitting: Primary Care

## 2020-11-10 ENCOUNTER — Encounter: Payer: Self-pay | Admitting: Primary Care

## 2020-11-10 ENCOUNTER — Other Ambulatory Visit: Payer: Self-pay

## 2020-11-10 VITALS — BP 126/80 | HR 96 | Temp 98.2°F | Ht 70.0 in | Wt 196.0 lb

## 2020-11-10 DIAGNOSIS — R109 Unspecified abdominal pain: Secondary | ICD-10-CM

## 2020-11-10 DIAGNOSIS — G8929 Other chronic pain: Secondary | ICD-10-CM | POA: Diagnosis not present

## 2020-11-10 NOTE — Assessment & Plan Note (Signed)
Since placement of VP shunt in August 2021. We discussed potential causes which include the line/cord settling in the abdomen, scar tissue, nerve involvement from irritation of the cord  He certainly does not appear acutely ill and is in no distress.  We will recheck his abdominal plain films today, he agrees.  Fortunately his pain has improved gradually over the last year.

## 2020-11-10 NOTE — Progress Notes (Signed)
Subjective:    Patient ID: Adam Juarez, male    DOB: December 06, 1940, 80 y.o.   MRN: [id number]  HPI  Adam Juarez is a very pleasant 80 y.o. male with a history of AAA, paroxysmal atrial fibrillation, normal pressure hydrocephalus with VP shunt, chronic fatigue, chronic back pain, hemorrhoids who presents today to discuss chronic abdominal pain.  VP shunt placed in August 2021 for normal pressure hydrocephalus. One week after placement he began to have abdominal pain that travels around from RLQ, RUQ, and LUQ, sometimes isolated in these areas. Pain is intermittent which occurs 1-2 times weekly lasting for a few hours, feels like a "sharp/shooting" pain which stops him from what he's doing.   He underwent abdominal plain films in September 2021 for his abdominal symptoms, was told that xray was normal.  Was also told by his surgeon that the line within the abdomen from the VP shunt would likely be bothersome for 6 months.  He denies fevers, sweats, vomiting.  Bowel movements have been stable and are per his norm.  Overall he does believe his pain has improved over the last year.  He does follow with GI, saw Dr. [name] this morning for hemorrhoids. Follows with neurosurgery for normal pressure hydrocephalus, last visit was in May 2022. Evaluated by neurologist in April 2022.    Review of Systems  Constitutional:  Negative for fever.  Gastrointestinal:  Positive for abdominal pain. Negative for nausea and vomiting.       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 a-fib x 1 episode patient states he "auto corrected" seen at [facility]   GERD (gastroesophageal reflux disease)    Headache    poor posture - none recently   History of kidney stones    Hydrocephalus (HCC)    Hyperlipidemia    Neuropathy    PSVT (paroxysmal  supraventricular tachycardia) ([place]) 2009   Controlled with "breathing process"   Renal stone 9/08, 6/15   Wears dentures    full upper    Social History   Socioeconomic History   Marital status: Married    Spouse name: Not on file   Number of children: Not on file   Years of education: Not on file   Highest education level: Not on file  Occupational History   Not on file  Tobacco Use   Smoking status: Former    Packs/day: 0.50    Years: 38.00    Pack years: 19.00    Types: Cigarettes    Quit date: 07/13/1997    Years since quitting: 23.3   Smokeless tobacco: Never  Vaping Use   Vaping Use: Never used  Substance and Sexual Activity   Alcohol use: No   Drug use: No   Sexual activity: Not on file  Other Topics Concern   Not on file  Social History Narrative   Not on file   Social Determinants of Health   Financial Resource Strain: Not on file  Food Insecurity: Not on file  Transportation Needs: Not on file  Physical Activity: Not on file  Stress: Not on file  Social Connections: Not on file  Intimate Partner Violence: Not on file    Past Surgical History:  Procedure Laterality Date   CARDIAC  CATHETERIZATION  2008   [place], [place]   CATARACT [facility]TRACTION [facility] 07/27/2015   Procedure: CATARACT EXTRACTION PHACO AND INTRAOCULAR LENS PLACEMENT (IOC);  Surgeon: Leandrew Koyanagi, MD;  Location: [place];  Service: Ophthalmology;  Laterality: Left;  TORIC   CATARACT EXTRACTION W/PHACO Right 08/24/2015   Procedure: CATARACT EXTRACTION PHACO AND INTRAOCULAR LENS PLACEMENT (IOC);  Surgeon: Leandrew Koyanagi, MD;  Location: [place];  Service: Ophthalmology;  Laterality: Right;  TORIC   COLONOSCOPY WITH PROPOFOL N/A 08/10/2020   Procedure: COLONOSCOPY WITH PROPOFOL;  Surgeon: Toledo, Benay Pike, MD;  Location: [facility];  Service: Gastroenterology;  Laterality: N/A;   HEMORROIDECTOMY  2010   banding   IR ANGIO INTRA EXTRACRAN SEL COM  CAROTID INNOMINATE BILAT MOD SED  10/30/2019   IR ANGIO VERTEBRAL SEL VERTEBRAL UNI R MOD SED  10/30/2019   IR RADIOLOGIST EVAL & MGMT  04/11/2020   IR US GUIDE VASC ACCESS RIGHT  10/30/2019   KIDNEY STONE SURGERY  9/08, 6/15   lithotripsy   LAPAROSCOPIC REVISION VENTRICULAR-PERITONEAL (V-P) SHUNT  01/08/2020   Procedure: LAPAROSCOPIC REVISION VENTRICULAR-PERITONEAL (V-P) SHUNT;  Surgeon: Consuella Lose, MD;  Location: [place];  Service: Neurosurgery;;   PROSTATE BIOPSY  2007   SKIN CANCER EXCISION     TOENAIL TRIMMING  11/2017   Dr. Elvina Mattes   TONSILLECTOMY     VASECTOMY     VENTRICULOPERITONEAL SHUNT Right 01/08/2020   Procedure: SHUNT INSERTION VENTRICULAR-PERITONEAL;  Surgeon: Consuella Lose, MD;  Location: [place];  Service: Neurosurgery;  Laterality: Right;    Family History  Problem Relation Age of Onset   AAA (abdominal aortic aneurysm) Father    Parkinson's disease Mother    Arthritis Mother     Allergies  Allergen Reactions   Bee Venom Anaphylaxis and Hives   Other Other (See Comments)    Pistachios - tickles throat  Pistachios - tickles throat   Tramadol Other (See Comments)    Current Outpatient Medications on File Prior to Visit  Medication Sig Dispense Refill   acetaminophen (TYLENOL) 500 MG tablet Take 1,000 mg by mouth every 8 (eight) hours as needed for moderate pain.     apixaban (ELIQUIS) 5 MG TABS tablet Take 1 tablet (5 mg total) by mouth 2 (two) times daily. 180 tablet 3   ascorbic acid (VITAMIN C) 500 MG tablet Take 500 mg by mouth daily.     atorvastatin (LIPITOR) 20 MG tablet TAKE 1 TABLET DAILY FOR CHOLESTEROL 90 tablet 3   diclofenac Sodium (VOLTAREN) 1 % GEL Apply 1 application topically 4 (four) times daily as needed (pain).     EPINEPHRINE 0.3 mg/0.3 mL IJ SOAJ injection INJECT 0.3 ML (0.3 MG TOTAL) INTO THE MUSCLE AS NEEDED FOR ANAPHYLAXIS 2 each 1   hydrocortisone (ANUSOL-HC) 25 MG suppository Place 1 suppository (25 mg total) rectally 2 (two)  times daily. 30 suppository 0   Magnesium Cl-Calcium Carbonate (SLOW-MAG PO) Take 1 tablet by mouth daily.      Magnesium Oxide 500 MG TABS Take by mouth.     Melatonin 1 MG CAPS Take 1 mg by mouth at bedtime as needed (sleep).     metoprolol succinate (TOPROL-XL) 25 MG 24 hr tablet TAKE 1 TABLET IN THE MORNING AND AT BEDTIME 180 tablet 0   Multiple Vitamin (MULTIVITAMIN) tablet Take 1 tablet by mouth daily.     Multiple Vitamins-Minerals (PRESERVISION AREDS 2 PO) Take 1 capsule by mouth 2 (two) times daily.  omeprazole (PRILOSEC) 20 MG capsule TAKE 1 CAPSULE DAILY 90 capsule 3   Polyethyl Glycol-Propyl Glycol (SYSTANE OP) Apply 1 drop to eye 3 (three) times daily as needed (dry eyes).     Potassium Gluconate 550 MG TABS Take 550 mg by mouth daily.     Probiotic Product (PROBIOTIC DAILY PO) Take 1 capsule by mouth daily.      vitamin B-12 (CYANOCOBALAMIN) 1000 MCG tablet Take 1,000 mcg by mouth daily.     No current facility-administered medications on file prior to visit.    BP 126/80   Pulse 96   Temp 98.2 F (36.8 C) (Temporal)   Ht 5\' 10"  (1.778 m)   Wt 196 lb (88.9 kg)   SpO2 95%   BMI 28.12 kg/m  Objective:   Physical Exam Constitutional:      General: He is not in acute distress. Abdominal:     General: Bowel sounds are normal.     Tenderness: There is no abdominal tenderness. There is no guarding.  Skin:    General: Skin is warm and dry.  Neurological:     Mental Status: He is alert.          Assessment & Plan:      This visit occurred during the SARS-CoV-2 public health emergency.  Safety protocols were in place, including screening questions prior to the visit, additional usage of staff PPE, and extensive cleaning of exam room while observing appropriate contact time as indicated for disinfecting solutions.

## 2020-11-10 NOTE — Patient Instructions (Signed)
Complete xray(s) prior to leaving today. I will notify you of your results once received.  It was a pleasure to see you today!  

## 2020-11-15 NOTE — Telephone Encounter (Signed)
Can we get a disc of his recent xray? He can come pick up.

## 2020-11-21 ENCOUNTER — Other Ambulatory Visit: Payer: Self-pay | Admitting: Cardiovascular Disease

## 2020-11-25 ENCOUNTER — Telehealth: Payer: Self-pay

## 2020-11-25 NOTE — Telephone Encounter (Signed)
[facility] - Client TELEPHONE ADVICE RECORD [facility] Patient Name: Adam Juarez ON Gender: Male DOB: 1941-05-13 Age: 80 Y 10 M 16 D Return Phone Number: [phone] (Primary), [phone] (Secondary) Address[place] [address] Client [place] [facility] - Client Client [facility] - Day Physician Alma Friendly - NP Contact Type Call Who Is Calling Patient / Member / Family / Caregiver Call Type Triage / Clinical Relationship To Patient Self Return Phone Number [phone] (Primary) Chief Complaint Dizziness Reason for Call Symptomatic / Request for Health Information Initial Comment Office is transferring caller who is having increasing dizzy spells this week. Hx of AFIB and aortic anuerism, etc. Provider is not at office today. Translation No Nurse Assessment Nurse: Ysidro Evert, RN, Levada Dy Date/Time Eilene Ghazi Time): 11/25/2020 2:19:02 PM Confirm and document reason for call. If symptomatic, describe symptoms. ---Caller states he is dizzy spells that started about a year ago. He isn't dizzy right now. When they happen they last a couple minutes. He states it feels like he is being "squeezed" when it happens and he feels disoriented then too Does the patient have any new or worsening symptoms? ---Yes Will a triage be completed? ---Yes Related visit to physician within the last 2 weeks? ---No Does the PT have any chronic conditions? (i.e. diabetes, asthma, this includes High risk factors for pregnancy, etc.) ---Yes List chronic conditions. ---a-fib, aortic anuerysm Is this a behavioral health or substance abuse call? ---No Guidelines Guideline Title Affirmed Question Affirmed Notes Nurse Date/Time (Eastern Time) Dizziness - Lightheadedness [1] MODERATE dizziness (e.g., interferes with normal activities) AND [2] has NOT been evaluated by physician for  this (Exception: dizziness Ysidro Evert, RN, Levada Dy 11/25/2020 2:23:07 PM PLEASE NOTE: All timestamps contained within this report are represented as [place] Standard Time. CONFIDENTIALTY NOTICE: This fax transmission is intended only for the addressee. It contains information that is legally privileged, confidential or otherwise protected from use or disclosure. If you are not the intended recipient, you are strictly prohibited from reviewing, disclosing, copying using or disseminating any of this information or taking any action in reliance on or regarding this information. If you have received this fax in error, please notify us immediately by telephone so that we can arrange for its return to [place]. Phone: [phone], Toll-Free: [phone], Fax: [phone] Page: 2 of 2 Call Id: [id number] Guidelines Guideline Title Affirmed Question Affirmed Notes Nurse Date/Time Eilene Ghazi Time) caused by heat exposure, sudden standing, or poor fluid intake) Disp. Time Eilene Ghazi Time) Disposition Final User 11/25/2020 2:28:10 PM See PCP within 24 Hours Yes Ysidro Evert, RN, Marin Shutter Disagree/Comply Comply Caller Understands Yes PreDisposition Did not know what to do Care Advice Given Per Guideline SEE PCP WITHIN 24 HOURS: CALL BACK IF: * Passes out (faints) * You become worse CARE ADVICE given per Dizziness (Adult) guideline. Referrals GO TO FACILITY UNDECIDED

## 2020-11-25 NOTE — Telephone Encounter (Signed)
Noted. Agree with dispo. 

## 2020-11-25 NOTE — Telephone Encounter (Addendum)
I spoke with pt; pt said over a a year pt having dizziness where head and body is being squeezed and heavy; comes on quickly with no warning sign especially when stressed; pt said that if has one episode then will expect at least one or more episodes the same day.no h/a. Pt has shunt in brain for fluid relief; No CP or SOB. When pt has these episodes pt has disorientation;pt knows where he is and who is with him. Pt said he feels disorientation by not knowing what to do at the time of the unusual feeling.pt had these type episodes on 11/24/20 and today at 12 noon pt said the worst of feelings was 3 mins;but the pt continues taking deep breaths and then eventually the feelings subsided. Pt said another concern is that he has been having these episodes for approx a year but there is usually several days or weeks in between episodes and this time had episodes yesterday and today. UC and ED precautions given and pt voiced understanding.Pt prefers not to start at an ED and will contact [facility] in [place] to see about going there. Sending note to Gentry Fitz NP and Dr Diona Browner who is in the office today. Pt will call with update next wk.

## 2020-12-01 ENCOUNTER — Ambulatory Visit: Payer: Medicare Other

## 2020-12-01 ENCOUNTER — Encounter: Payer: Self-pay | Admitting: Primary Care

## 2020-12-01 ENCOUNTER — Other Ambulatory Visit: Payer: Self-pay

## 2020-12-01 ENCOUNTER — Ambulatory Visit (INDEPENDENT_AMBULATORY_CARE_PROVIDER_SITE_OTHER): Payer: Medicare Other | Admitting: Primary Care

## 2020-12-01 VITALS — BP 128/82 | HR 82 | Temp 97.6°F | Ht 70.0 in | Wt 198.0 lb

## 2020-12-01 DIAGNOSIS — G912 (Idiopathic) normal pressure hydrocephalus: Secondary | ICD-10-CM

## 2020-12-01 DIAGNOSIS — R7303 Prediabetes: Secondary | ICD-10-CM | POA: Diagnosis not present

## 2020-12-01 DIAGNOSIS — R42 Dizziness and giddiness: Secondary | ICD-10-CM | POA: Diagnosis not present

## 2020-12-01 LAB — BASIC METABOLIC PANEL
BUN: 21 mg/dL (ref 6–23)
CO2: 26 mEq/L (ref 19–32)
Calcium: 9.7 mg/dL (ref 8.4–10.5)
Chloride: 105 mEq/L (ref 96–112)
Creatinine, Ser: 0.88 mg/dL (ref 0.40–1.50)
GFR: 82.14 mL/min (ref >60.00)
Glucose, Bld: 141 mg/dL — ABNORMAL HIGH (ref 70–99)
Potassium: 4 mEq/L (ref 3.5–5.1)
Sodium: 140 mEq/L (ref 135–145)

## 2020-12-01 LAB — CBC
HCT: 41.7 % (ref 39.0–52.0)
Hemoglobin: 14 g/dL (ref 13.0–17.0)
MCHC: 33.6 g/dL (ref 30.0–36.0)
MCV: 99 fl (ref 78.0–100.0)
Platelets: 142 10*3/uL — ABNORMAL LOW (ref 150.0–400.0)
RBC: 4.21 Mil/uL — ABNORMAL LOW (ref 4.22–5.81)
RDW: 13.3 % (ref 11.5–15.5)
WBC: 4.1 10*3/uL (ref 4.0–10.5)

## 2020-12-01 LAB — HEMOGLOBIN A1C: Hgb A1c MFr Bld: 5.9 % (ref 4.6–6.5)

## 2020-12-01 NOTE — Assessment & Plan Note (Signed)
Chronic and intermittent.  Doesn't seem to be atrial fibrillation, he notices when he's in atrial fibrillation and has not captured this on his monitor.  Could be vertigo given sudden onset with several days of improved symptoms. Exam today not evident of CVA, but given medical history will need to obtain updated  CT to evaluate.   Checking BMP, CBC, and A1C. CT head without contracts placed.

## 2020-12-01 NOTE — Patient Instructions (Signed)
Stop by the lab prior to leaving today. I will notify you of your results once received.   Adam Juarez will call you regarding your CT scan.   You can try some Meclizine if the symptoms occur again. I recommend taking 12.5 mg (1/2 tablet of 25 mg). You can take this up to three times daily. This may cause drowsiness.   It was a pleasure to see you today!

## 2020-12-01 NOTE — Progress Notes (Signed)
Subjective:    Patient ID: Adam Juarez, male    DOB: 25-Jun-1940, 80 y.o.   MRN: [id number]  HPI  Adam Juarez is a very pleasant 80 y.o. male with a history of PAD, normal pressure hydrocephalus (s/p shunt placement), chronic fatigue, paroxysmal atrial fibrillation, memory changes, chronic abdominal pain who presents today to discuss dizziness.  Dizziness began >6 months ago for which is intermittent. Symptoms occur sporadically with rest and movement, starts with a "dizzy feeling" and feels like "my head and body are in a compression suit". This causes him to feel panicked and "can't think straight". Episodes last 2-6 minutes occurring every few months. Each episode will occur for several days in a row, weaker and weaker each day.   His wife is with him today who has witnessed this episode. He appears panicked, cannot communicate, doesn't want to be bothered. He has monitored his watch which tracks heart rhythm and rate, has never seen atrial fibrillation, bradycardia, or tachycardia during episodes. His last episode was about one week ago.   These symptoms do not feel like his prior episode of vertigo years ago. He denies slurred speech, unilateral weakness, headaches, falls, blurred vision.    Review of Systems  Eyes:  Negative for visual disturbance.  Respiratory:  Negative for shortness of breath.   Cardiovascular:  Negative for chest pain and palpitations.  Neurological:  Positive for dizziness. Negative for speech difficulty, numbness and headaches.  Psychiatric/Behavioral:  The patient is nervous/anxious.         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 a-fib x 1 episode patient states he "auto corrected" seen at [facility]   GERD (gastroesophageal reflux disease)    Headache    poor posture - none recently   History of kidney stones     Hydrocephalus (HCC)    Hyperlipidemia    Neuropathy    PSVT (paroxysmal supraventricular tachycardia) ([place]) 2009   Controlled with "breathing process"   Renal stone 9/08, 6/15   Wears dentures    full upper    Social History   Socioeconomic History   Marital status: Married    Spouse name: Not on file   Number of children: Not on file   Years of education: Not on file   Highest education level: Not on file  Occupational History   Not on file  Tobacco Use   Smoking status: Former    Packs/day: 0.50    Years: 38.00    Pack years: 19.00    Types: Cigarettes    Quit date: 07/13/1997    Years since quitting: 23.4   Smokeless tobacco: Never  Vaping Use   Vaping Use: Never used  Substance and Sexual Activity   Alcohol use: No   Drug use: No   Sexual activity: Not on file  Other Topics Concern   Not on file  Social History Narrative   Not on file   Social Determinants of Health   Financial Resource Strain: Not on file  Food Insecurity: Not on file  Transportation Needs: Not on file  Physical Activity: Not on file  Stress: Not on file  Social Connections: Not on file  Intimate Partner Violence: Not on file    Past Surgical History:  Procedure Laterality Date   CARDIAC CATHETERIZATION  2008  [place], [place]   CATARACT EXTRACTION W/PHACO Left 07/27/2015   Procedure: CATARACT EXTRACTION PHACO AND INTRAOCULAR LENS PLACEMENT (IOC);  Surgeon: Leandrew Koyanagi, MD;  Location: [place];  Service: Ophthalmology;  Laterality: Left;  TORIC   CATARACT EXTRACTION W/PHACO Right 08/24/2015   Procedure: CATARACT EXTRACTION PHACO AND INTRAOCULAR LENS PLACEMENT (IOC);  Surgeon: Leandrew Koyanagi, MD;  Location: [place];  Service: Ophthalmology;  Laterality: Right;  TORIC   COLONOSCOPY WITH PROPOFOL N/A 08/10/2020   Procedure: COLONOSCOPY WITH PROPOFOL;  Surgeon: Toledo, Benay Pike, MD;  Location: [facility];  Service: Gastroenterology;  Laterality: N/A;    HEMORROIDECTOMY  2010   banding   IR ANGIO INTRA EXTRACRAN SEL COM CAROTID INNOMINATE BILAT MOD SED  10/30/2019   IR ANGIO VERTEBRAL SEL VERTEBRAL UNI R MOD SED  10/30/2019   IR RADIOLOGIST EVAL & MGMT  04/11/2020   IR US GUIDE VASC ACCESS RIGHT  10/30/2019   KIDNEY STONE SURGERY  9/08, 6/15   lithotripsy   LAPAROSCOPIC REVISION VENTRICULAR-PERITONEAL (V-P) SHUNT  01/08/2020   Procedure: LAPAROSCOPIC REVISION VENTRICULAR-PERITONEAL (V-P) SHUNT;  Surgeon: Consuella Lose, MD;  Location: [place];  Service: Neurosurgery;;   PROSTATE BIOPSY  2007   SKIN CANCER EXCISION     TOENAIL TRIMMING  11/2017   Dr. Elvina Mattes   TONSILLECTOMY     VASECTOMY     VENTRICULOPERITONEAL SHUNT Right 01/08/2020   Procedure: SHUNT INSERTION VENTRICULAR-PERITONEAL;  Surgeon: Consuella Lose, MD;  Location: [place];  Service: Neurosurgery;  Laterality: Right;    Family History  Problem Relation Age of Onset   AAA (abdominal aortic aneurysm) Father    Parkinson's disease Mother    Arthritis Mother     Allergies  Allergen Reactions   Bee Venom Anaphylaxis and Hives   Other Other (See Comments)    Pistachios - tickles throat  Pistachios - tickles throat   Tramadol Other (See Comments)    Current Outpatient Medications on File Prior to Visit  Medication Sig Dispense Refill   acetaminophen (TYLENOL) 500 MG tablet Take 1,000 mg by mouth every 8 (eight) hours as needed for moderate pain.     apixaban (ELIQUIS) 5 MG TABS tablet Take 1 tablet (5 mg total) by mouth 2 (two) times daily. 180 tablet 3   ascorbic acid (VITAMIN C) 500 MG tablet Take 500 mg by mouth daily.     atorvastatin (LIPITOR) 20 MG tablet TAKE 1 TABLET DAILY FOR CHOLESTEROL 90 tablet 3   diclofenac Sodium (VOLTAREN) 1 % GEL Apply 1 application topically 4 (four) times daily as needed (pain).     EPINEPHRINE 0.3 mg/0.3 mL IJ SOAJ injection INJECT 0.3 ML (0.3 MG TOTAL) INTO THE MUSCLE AS NEEDED FOR ANAPHYLAXIS 2 each 1   hydrocortisone  (ANUSOL-HC) 25 MG suppository Place 1 suppository (25 mg total) rectally 2 (two) times daily. 30 suppository 0   Magnesium Cl-Calcium Carbonate (SLOW-MAG PO) Take 1 tablet by mouth daily.      Magnesium Oxide 500 MG TABS Take by mouth.     Melatonin 1 MG CAPS Take 1 mg by mouth at bedtime as needed (sleep).     metoprolol succinate (TOPROL-XL) 25 MG 24 hr tablet TAKE 1 TABLET IN THE MORNING AND AT BEDTIME 180 tablet 2   Multiple Vitamin (MULTIVITAMIN) tablet Take 1 tablet by mouth daily.     Multiple Vitamins-Minerals (PRESERVISION AREDS 2 PO) Take 1 capsule by mouth 2 (two) times daily.      omeprazole (PRILOSEC) 20 MG capsule  TAKE 1 CAPSULE DAILY 90 capsule 3   Polyethyl Glycol-Propyl Glycol (SYSTANE OP) Apply 1 drop to eye 3 (three) times daily as needed (dry eyes).     Potassium Gluconate 550 MG TABS Take 550 mg by mouth daily.     Probiotic Product (PROBIOTIC DAILY PO) Take 1 capsule by mouth daily.      vitamin B-12 (CYANOCOBALAMIN) 1000 MCG tablet Take 1,000 mcg by mouth daily.     No current facility-administered medications on file prior to visit.    BP 128/82   Pulse 82   Temp 97.6 F (36.4 C) (Temporal)   Ht 5\' 10"  (1.778 m)   Wt 198 lb (89.8 kg)   SpO2 97%   BMI 28.41 kg/m  Objective:   Physical Exam Eyes:     Extraocular Movements: Extraocular movements intact.  Cardiovascular:     Rate and Rhythm: Normal rate and regular rhythm.  Pulmonary:     Effort: Pulmonary effort is normal.     Breath sounds: Normal breath sounds. No wheezing or rales.  Musculoskeletal:     Cervical back: Neck supple.  Skin:    General: Skin is warm and dry.  Neurological:     Mental Status: He is alert and oriented to person, place, and time.     Cranial Nerves: No cranial nerve deficit.     Coordination: Coordination normal.     Gait: Gait normal.          Assessment & Plan:      This visit occurred during the SARS-CoV-2 public health emergency.  Safety protocols were in  place, including screening questions prior to the visit, additional usage of staff PPE, and extensive cleaning of exam room while observing appropriate contact time as indicated for disinfecting solutions.

## 2020-12-02 ENCOUNTER — Telehealth: Payer: Self-pay | Admitting: *Deleted

## 2020-12-02 ENCOUNTER — Ambulatory Visit
Admission: RE | Admit: 2020-12-02 | Discharge: 2020-12-02 | Disposition: A | Discharge status: Home / Self Care | Payer: Medicare Other | Source: Ambulatory Visit | Attending: Primary Care | Admitting: Primary Care

## 2020-12-02 DIAGNOSIS — G912 (Idiopathic) normal pressure hydrocephalus: Secondary | ICD-10-CM | POA: Diagnosis not present

## 2020-12-02 DIAGNOSIS — R42 Dizziness and giddiness: Secondary | ICD-10-CM | POA: Diagnosis not present

## 2020-12-02 NOTE — Telephone Encounter (Signed)
Noted, will review

## 2020-12-02 NOTE — Telephone Encounter (Signed)
Allie Bossier NP was advised that the CT results are in Epic and that the patient is still there. Anda Kraft stated that it is okay for patient to leave and she will be in touch with him. Colletta Maryland advised.

## 2020-12-02 NOTE — Telephone Encounter (Signed)
Colletta Maryland called to let Allie Bossier NP know that results are in Epic. Patient is still there.

## 2020-12-07 DIAGNOSIS — R1031 Right lower quadrant pain: Secondary | ICD-10-CM | POA: Diagnosis not present

## 2020-12-07 DIAGNOSIS — K5792 Diverticulitis of intestine, part unspecified, without perforation or abscess without bleeding: Secondary | ICD-10-CM | POA: Diagnosis not present

## 2020-12-26 ENCOUNTER — Other Ambulatory Visit: Payer: Self-pay | Admitting: Primary Care

## 2021-01-23 DIAGNOSIS — H3533 Angioid streaks of macula: Secondary | ICD-10-CM | POA: Diagnosis not present

## 2021-01-23 DIAGNOSIS — H353132 Nonexudative age-related macular degeneration, bilateral, intermediate dry stage: Secondary | ICD-10-CM | POA: Diagnosis not present

## 2021-01-23 DIAGNOSIS — H43813 Vitreous degeneration, bilateral: Secondary | ICD-10-CM | POA: Diagnosis not present

## 2021-01-24 DIAGNOSIS — R2689 Other abnormalities of gait and mobility: Secondary | ICD-10-CM | POA: Diagnosis not present

## 2021-01-24 DIAGNOSIS — R4189 Other symptoms and signs involving cognitive functions and awareness: Secondary | ICD-10-CM | POA: Diagnosis not present

## 2021-01-24 DIAGNOSIS — R4689 Other symptoms and signs involving appearance and behavior: Secondary | ICD-10-CM | POA: Diagnosis not present

## 2021-01-24 DIAGNOSIS — G912 (Idiopathic) normal pressure hydrocephalus: Secondary | ICD-10-CM | POA: Diagnosis not present

## 2021-01-26 ENCOUNTER — Other Ambulatory Visit: Payer: Self-pay | Admitting: Neurology

## 2021-01-26 DIAGNOSIS — R4689 Other symptoms and signs involving appearance and behavior: Secondary | ICD-10-CM

## 2021-02-03 DIAGNOSIS — G912 (Idiopathic) normal pressure hydrocephalus: Secondary | ICD-10-CM | POA: Diagnosis not present

## 2021-02-06 ENCOUNTER — Ambulatory Visit
Admission: RE | Admit: 2021-02-06 | Discharge: 2021-02-06 | Disposition: A | Discharge status: Home / Self Care | Payer: Medicare Other | Source: Ambulatory Visit | Attending: Neurology | Admitting: Neurology

## 2021-02-06 ENCOUNTER — Other Ambulatory Visit: Payer: Self-pay

## 2021-02-06 DIAGNOSIS — R4689 Other symptoms and signs involving appearance and behavior: Secondary | ICD-10-CM | POA: Insufficient documentation

## 2021-02-06 DIAGNOSIS — R413 Other amnesia: Secondary | ICD-10-CM | POA: Diagnosis not present

## 2021-02-07 DIAGNOSIS — Z4541 Encounter for adjustment and management of cerebrospinal fluid drainage device: Secondary | ICD-10-CM | POA: Diagnosis not present

## 2021-02-10 DIAGNOSIS — L03032 Cellulitis of left toe: Secondary | ICD-10-CM | POA: Diagnosis not present

## 2021-02-10 DIAGNOSIS — M79672 Pain in left foot: Secondary | ICD-10-CM | POA: Diagnosis not present

## 2021-02-14 DIAGNOSIS — I671 Cerebral aneurysm, nonruptured: Secondary | ICD-10-CM | POA: Diagnosis not present

## 2021-02-14 DIAGNOSIS — R4189 Other symptoms and signs involving cognitive functions and awareness: Secondary | ICD-10-CM | POA: Diagnosis not present

## 2021-02-14 DIAGNOSIS — R202 Paresthesia of skin: Secondary | ICD-10-CM | POA: Diagnosis not present

## 2021-02-14 DIAGNOSIS — I480 Paroxysmal atrial fibrillation: Secondary | ICD-10-CM | POA: Diagnosis not present

## 2021-02-14 DIAGNOSIS — G912 (Idiopathic) normal pressure hydrocephalus: Secondary | ICD-10-CM | POA: Diagnosis not present

## 2021-02-14 DIAGNOSIS — R4689 Other symptoms and signs involving appearance and behavior: Secondary | ICD-10-CM | POA: Diagnosis not present

## 2021-02-14 DIAGNOSIS — E559 Vitamin D deficiency, unspecified: Secondary | ICD-10-CM | POA: Diagnosis not present

## 2021-02-27 DIAGNOSIS — G912 (Idiopathic) normal pressure hydrocephalus: Secondary | ICD-10-CM | POA: Diagnosis not present

## 2021-02-27 DIAGNOSIS — Z4541 Encounter for adjustment and management of cerebrospinal fluid drainage device: Secondary | ICD-10-CM | POA: Diagnosis not present

## 2021-03-01 ENCOUNTER — Ambulatory Visit (INDEPENDENT_AMBULATORY_CARE_PROVIDER_SITE_OTHER): Payer: Medicare Other

## 2021-03-01 DIAGNOSIS — Z23 Encounter for immunization: Secondary | ICD-10-CM

## 2021-03-02 ENCOUNTER — Ambulatory Visit: Payer: TRICARE For Life (TFL)

## 2021-03-05 DIAGNOSIS — R4689 Other symptoms and signs involving appearance and behavior: Secondary | ICD-10-CM | POA: Diagnosis not present

## 2021-03-05 DIAGNOSIS — R4182 Altered mental status, unspecified: Secondary | ICD-10-CM | POA: Diagnosis not present

## 2021-03-06 DIAGNOSIS — M2041 Other hammer toe(s) (acquired), right foot: Secondary | ICD-10-CM | POA: Diagnosis not present

## 2021-03-06 DIAGNOSIS — M79674 Pain in right toe(s): Secondary | ICD-10-CM | POA: Diagnosis not present

## 2021-03-06 DIAGNOSIS — M216X1 Other acquired deformities of right foot: Secondary | ICD-10-CM | POA: Diagnosis not present

## 2021-03-06 DIAGNOSIS — L909 Atrophic disorder of skin, unspecified: Secondary | ICD-10-CM | POA: Diagnosis not present

## 2021-03-06 DIAGNOSIS — M216X9 Other acquired deformities of unspecified foot: Secondary | ICD-10-CM | POA: Diagnosis not present

## 2021-03-06 DIAGNOSIS — M79675 Pain in left toe(s): Secondary | ICD-10-CM | POA: Diagnosis not present

## 2021-03-06 DIAGNOSIS — M7741 Metatarsalgia, right foot: Secondary | ICD-10-CM | POA: Diagnosis not present

## 2021-03-06 DIAGNOSIS — M545 Low back pain, unspecified: Secondary | ICD-10-CM | POA: Diagnosis not present

## 2021-03-06 DIAGNOSIS — M216X2 Other acquired deformities of left foot: Secondary | ICD-10-CM | POA: Diagnosis not present

## 2021-03-06 DIAGNOSIS — M792 Neuralgia and neuritis, unspecified: Secondary | ICD-10-CM | POA: Diagnosis not present

## 2021-03-06 DIAGNOSIS — L600 Ingrowing nail: Secondary | ICD-10-CM | POA: Diagnosis not present

## 2021-03-06 DIAGNOSIS — B351 Tinea unguium: Secondary | ICD-10-CM | POA: Diagnosis not present

## 2021-03-14 DIAGNOSIS — H353132 Nonexudative age-related macular degeneration, bilateral, intermediate dry stage: Secondary | ICD-10-CM | POA: Diagnosis not present

## 2021-03-30 DIAGNOSIS — I671 Cerebral aneurysm, nonruptured: Secondary | ICD-10-CM | POA: Diagnosis not present

## 2021-03-30 DIAGNOSIS — G912 (Idiopathic) normal pressure hydrocephalus: Secondary | ICD-10-CM | POA: Diagnosis not present

## 2021-04-11 DIAGNOSIS — I671 Cerebral aneurysm, nonruptured: Secondary | ICD-10-CM | POA: Diagnosis not present

## 2021-04-11 DIAGNOSIS — Z4541 Encounter for adjustment and management of cerebrospinal fluid drainage device: Secondary | ICD-10-CM | POA: Diagnosis not present

## 2021-04-11 DIAGNOSIS — G912 (Idiopathic) normal pressure hydrocephalus: Secondary | ICD-10-CM | POA: Diagnosis not present

## 2021-04-11 DIAGNOSIS — Z982 Presence of cerebrospinal fluid drainage device: Secondary | ICD-10-CM | POA: Diagnosis not present

## 2021-04-19 ENCOUNTER — Other Ambulatory Visit: Payer: Self-pay | Admitting: Primary Care

## 2021-04-19 DIAGNOSIS — K219 Gastro-esophageal reflux disease without esophagitis: Secondary | ICD-10-CM

## 2021-04-20 ENCOUNTER — Ambulatory Visit (INDEPENDENT_AMBULATORY_CARE_PROVIDER_SITE_OTHER): Payer: Medicare Other

## 2021-04-20 ENCOUNTER — Other Ambulatory Visit: Payer: Self-pay

## 2021-04-20 DIAGNOSIS — I7143 Infrarenal abdominal aortic aneurysm, without rupture: Secondary | ICD-10-CM

## 2021-04-20 DIAGNOSIS — I714 Abdominal aortic aneurysm, without rupture, unspecified: Secondary | ICD-10-CM

## 2021-04-24 DIAGNOSIS — R4182 Altered mental status, unspecified: Secondary | ICD-10-CM | POA: Diagnosis not present

## 2021-04-25 ENCOUNTER — Telehealth: Payer: Self-pay

## 2021-04-25 NOTE — Telephone Encounter (Signed)
Left detail message on VM of pt's recent results okay by DPR, Dr. Rockey Situ advised   "Ultrasound abdomen  Unchanged size and rhythm  Estimated 3.9 cm  Would recommend reevaluation 1 year "  At this time, no further recommendations or medications changes, advised to call office for any concerns or questions, otherwise will see at next visit.

## 2021-04-28 DIAGNOSIS — I720 Aneurysm of carotid artery: Secondary | ICD-10-CM | POA: Diagnosis not present

## 2021-04-28 DIAGNOSIS — G912 (Idiopathic) normal pressure hydrocephalus: Secondary | ICD-10-CM | POA: Diagnosis not present

## 2021-05-16 DIAGNOSIS — R4189 Other symptoms and signs involving cognitive functions and awareness: Secondary | ICD-10-CM | POA: Diagnosis not present

## 2021-05-16 DIAGNOSIS — I480 Paroxysmal atrial fibrillation: Secondary | ICD-10-CM | POA: Diagnosis not present

## 2021-05-16 DIAGNOSIS — G912 (Idiopathic) normal pressure hydrocephalus: Secondary | ICD-10-CM | POA: Diagnosis not present

## 2021-05-16 DIAGNOSIS — R4689 Other symptoms and signs involving appearance and behavior: Secondary | ICD-10-CM | POA: Diagnosis not present

## 2021-05-16 DIAGNOSIS — R202 Paresthesia of skin: Secondary | ICD-10-CM | POA: Diagnosis not present

## 2021-05-16 DIAGNOSIS — R2689 Other abnormalities of gait and mobility: Secondary | ICD-10-CM | POA: Diagnosis not present

## 2021-05-23 NOTE — Progress Notes (Signed)
Subjective:   Adam Juarez is a 81 y.o. male who presents for Medicare Annual/Subsequent preventive examination.  I connected with [name] today by telephone and verified that I am speaking with the correct person using two identifiers. Location patient: home Location provider: work Persons participating in the virtual visit: patient, Marine scientist.    I discussed the limitations, risks, security and privacy concerns of performing an evaluation and management service by telephone and the availability of in person appointments. I also discussed with the patient that there may be a patient responsible charge related to this service. The patient expressed understanding and verbally consented to this telephonic visit.    Interactive audio and video telecommunications were attempted between this provider and patient, however failed, due to patient having technical difficulties OR patient did not have access to video capability.  We continued and completed visit with audio only.  Some vital signs may be absent or patient reported.   Time Spent with patient on telephone encounter: 25 minutes  Review of Systems     Cardiac Risk Factors include: advanced age (>46men, >18 women);dyslipidemia     Objective:    Today's Vitals   05/24/21 1115  Weight: 190 lb (86.2 kg)  Height: 5\' 10"  (1.778 m)   Body mass index is 27.26 kg/m.  Advanced Directives 05/24/2021 08/10/2020 01/05/2020 12/07/2019 10/29/2019 10/27/2019 03/12/2019  Does Patient Have a Medical Advance Directive? Yes Yes Yes Yes Yes Yes No  Type of Paramedic of [place];Living will [place];Living will [place];Living will [place];Living will [place];Living will [place];Living will -  Does patient want to make changes to medical advance directive? Yes (MAU/Ambulatory/Procedural Areas - Information given) - - - No -  Patient declined No - Patient declined -  Copy of [place] in Chart? Yes - validated most recent copy scanned in chart (See row information) No - copy requested Yes - validated most recent copy scanned in chart (See row information) - No - copy requested No - copy requested -  Would patient like information on creating a medical advance directive? - - - - - - No - Patient declined    Current Medications (verified) Outpatient Encounter Medications as of 05/24/2021  Medication Sig   acetaminophen (TYLENOL) 500 MG tablet Take 1,000 mg by mouth every 8 (eight) hours as needed for moderate pain.   apixaban (ELIQUIS) 5 MG TABS tablet Take 1 tablet (5 mg total) by mouth 2 (two) times daily.   ascorbic acid (VITAMIN C) 500 MG tablet Take 500 mg by mouth daily.   atorvastatin (LIPITOR) 20 MG tablet TAKE 1 TABLET DAILY FOR CHOLESTEROL   diclofenac Sodium (VOLTAREN) 1 % GEL Apply 1 application topically 4 (four) times daily as needed (pain).   EPINEPHRINE 0.3 mg/0.3 mL IJ SOAJ injection INJECT 0.3 ML (0.3 MG TOTAL) INTO THE MUSCLE AS NEEDED FOR ANAPHYLAXIS   escitalopram (LEXAPRO) 5 MG tablet Take 5 mg by mouth daily.   hydrocortisone (ANUSOL-HC) 25 MG suppository Place 1 suppository (25 mg total) rectally 2 (two) times daily.   Magnesium Cl-Calcium Carbonate (SLOW-MAG PO) Take 1 tablet by mouth daily.    Magnesium Oxide 500 MG TABS Take by mouth.   Melatonin 1 MG CAPS Take 1 mg by mouth at bedtime as needed (sleep).   metoprolol succinate (TOPROL-XL) 25 MG 24 hr tablet TAKE 1 TABLET IN THE MORNING AND AT BEDTIME  Multiple Vitamin (MULTIVITAMIN) tablet Take 1 tablet by mouth daily.   Multiple Vitamins-Minerals (PRESERVISION AREDS 2 PO) Take 1 capsule by mouth 2 (two) times daily.    omeprazole (PRILOSEC) 20 MG capsule Take 1 capsule (20 mg total) by mouth daily. For heartburn   Polyethyl Glycol-Propyl Glycol (SYSTANE OP) Apply 1 drop to eye 3 (three) times daily as needed (dry eyes).    Potassium Gluconate 550 MG TABS Take 550 mg by mouth daily.   Probiotic Product (PROBIOTIC DAILY PO) Take 1 capsule by mouth daily.    vitamin B-12 (CYANOCOBALAMIN) 1000 MCG tablet Take 1,000 mcg by mouth daily.   No facility-administered encounter medications on file as of 05/24/2021.    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 a-fib x 1 episode patient states he "auto corrected" seen at [facility]   GERD (gastroesophageal reflux disease)    Headache    poor posture - none recently   History of kidney stones    Hydrocephalus (HCC)    Hyperlipidemia    Neuropathy    PSVT (paroxysmal supraventricular tachycardia) ([place]) 2009   Controlled with "breathing process"   Renal stone 9/08, 6/15   Wears dentures    full upper   Past Surgical History:  Procedure Laterality Date   CARDIAC CATHETERIZATION  2008   [place], [place]   CATARACT EXTRACTION [facility] Left 07/27/2015   Procedure: CATARACT EXTRACTION PHACO AND INTRAOCULAR LENS PLACEMENT ([place]);  Surgeon: Leandrew Koyanagi, MD;  Location: [place];  Service: Ophthalmology;  Laterality: Left;  TORIC   CATARACT EXTRACTION W/PHACO Right 08/24/2015   Procedure: CATARACT EXTRACTION PHACO AND INTRAOCULAR LENS PLACEMENT (I[facility]);  Surgeon: Leandrew Koyanagi, MD;  Location: [place];  Service: Ophthalmology;  Laterality: Right;  TORIC   COLONOSCOPY WITH PROPOFOL N/A 08/10/2020   Procedure: COLONOSCOPY WITH PROPOFOL;  Surgeon: Toledo, Benay Pike, MD;  Location: [facility];  Service: Gastroenterology;  Laterality: N/A;   HEMORROIDECTOMY  2010   banding   IR ANGIO INTRA EXTRACRAN SEL COM CAROTID INNOMINATE BILAT MOD SED  10/30/2019   IR ANGIO VERTEBRAL SEL VERTEBRAL UNI R MOD SED  10/30/2019   IR RADIOLOGIST EVAL & MGMT   04/11/2020   IR US GUIDE VASC ACCESS RIGHT  10/30/2019   KIDNEY STONE SURGERY  9/08, 6/15   lithotripsy   LAPAROSCOPIC REVISION VENTRICULAR-PERITONEAL (V-P) SHUNT  01/08/2020   Procedure: LAPAROSCOPIC REVISION VENTRICULAR-PERITONEAL (V-P) SHUNT;  Surgeon: Consuella Lose, MD;  Location: [place];  Service: Neurosurgery;;   PROSTATE BIOPSY  2007   SKIN CANCER EXCISION     TOENAIL TRIMMING  11/2017   Dr. Elvina Mattes   TONSILLECTOMY     VASECTOMY     VENTRICULOPERITONEAL SHUNT Right 01/08/2020   Procedure: SHUNT INSERTION VENTRICULAR-PERITONEAL;  Surgeon: Consuella Lose, MD;  Location: [place];  Service: Neurosurgery;  Laterality: Right;   Family History  Problem Relation Age of Onset   AAA (abdominal aortic aneurysm) Father    Parkinson's disease Mother    Arthritis Mother    Social History   Socioeconomic History   Marital status: Married    Spouse name: Not on file   Number of children: Not on file   Years of education: Not on file   Highest education level:  Not on file  Occupational History   Not on file  Tobacco Use   Smoking status: Former    Packs/day: 0.50    Years: 38.00    Pack years: 19.00    Types: Cigarettes    Quit date: 07/13/1997    Years since quitting: 23.8   Smokeless tobacco: Never  Vaping Use   Vaping Use: Never used  Substance and Sexual Activity   Alcohol use: No   Drug use: No   Sexual activity: Not on file  Other Topics Concern   Not on file  Social History Narrative   Not on file   Social Determinants of Health   Financial Resource Strain: Low Risk    Difficulty of Paying Living Expenses: Not hard at all  Food Insecurity: No Food Insecurity   Worried About Charity fundraiser in the Last Year: Never true   Ran Out of Food in the Last Year: Never true  Transportation Needs: No Transportation Needs   Lack of Transportation (Medical): No   Lack of Transportation (Non-Medical): No  Physical Activity: Insufficiently Active   Days of Exercise  per Week: 2 days   Minutes of Exercise per Session: 10 min  Stress: No Stress Concern Present   Feeling of Stress : Not at all  Social Connections: Socially Integrated   Frequency of Communication with Friends and Family: More than three times a week   Frequency of Social Gatherings with Friends and Family: Three times a week   Attends Religious Services: More than 4 times per year   Active Member of Clubs or Organizations: Yes   Attends Music therapist: More than 4 times per year   Marital Status: Married    Tobacco Counseling Counseling given: Not Answered   Clinical Intake:  Pre-visit preparation completed: Yes  Pain : No/denies pain     BMI - recorded: 27.26 Nutritional Status: BMI 25 -29 Overweight Nutritional Risks: None Diabetes: No  How often do you need to have someone help you when you read instructions, pamphlets, or other written materials from your doctor or pharmacy?: 1 - Never  Diabetic? No  Interpreter Needed?: No  Information entered by :: Orrin Brigham LPN   Activities of Daily Living In your present state of health, do you have any difficulty performing the following activities: 05/24/2021  Hearing? N  Vision? N  Difficulty concentrating or making decisions? N  Walking or climbing stairs? N  Dressing or bathing? N  Doing errands, shopping? N  Preparing Food and eating ? N  Using the Toilet? N  In the past six months, have you accidently leaked urine? N  Do you have problems with loss of bowel control? N  Managing your Medications? N  Comment wife handles  Managing your Finances? N  Housekeeping or managing your Housekeeping? N  Some recent data might be hidden    Patient Care Team: Pleas Koch, NP as PCP - General (Internal Medicine) Rockey Situ Kathlene November, MD as PCP - Cardiology (Cardiology) Minna Merritts, MD as Consulting Physician (Cardiology) Leandrew Koyanagi, MD as Referring Physician (Ophthalmology) Ccs,  Md, MD as Consulting Physician (General Surgery) Caryl Comes, MD as Physician Assistant (Neurosurgery)  Indicate any recent Medical Services you may have received from other than [facility] providers in the past year (date may be approximate).     Assessment:   This is a routine wellness examination for [place].  Hearing/Vision screen Hearing Screening - Comments:: No issues Vision Screening -  Comments:: Last exam 2022, Dr. Mearl Latin @ [facility]  Dietary issues and exercise activities discussed: Current Exercise Habits: The patient does not participate in regular exercise at present;Home exercise routine, Type of exercise: walking, Time (Minutes): 10, Frequency (Times/Week): 2, Weekly Exercise (Minutes/Week): 20, Intensity: Mild   Goals Addressed             This Visit's Progress    Patient Stated       Would like to increase exercise and walk more       Depression Screen PHQ 2/9 Scores 05/24/2021 07/21/2020 02/06/2019 01/28/2018 01/10/2017 03/14/2016  PHQ - 2 Score 0 3 0 0 0 0  PHQ- 9 Score - 12 - 0 1 -    Fall Risk Fall Risk  05/24/2021 12/01/2020 02/06/2019 01/28/2018 01/10/2017  Falls in the past year? 0 0 0 Yes Yes  Comment - - - pt tripped and fell onto left shoulder; urgent care treatment -  Number falls in past yr: 0 0 0 1 2 or more  Injury with Fall? 0 0 0 Yes Yes  Risk for fall due to : No Fall Risks History of fall(s) - - -  Follow up Falls prevention discussed - - - -    FALL RISK PREVENTION PERTAINING TO THE HOME:  Any stairs in or around the home? No  If so, are there any without handrails? No  Home free of loose throw rugs in walkways, pet beds, electrical cords, etc? Yes  Adequate lighting in your home to reduce risk of falls? Yes   ASSISTIVE DEVICES UTILIZED TO PREVENT FALLS:  Life alert? No  Use of a cane, walker or w/c? No  Grab bars in the bathroom? Yes  Shower chair or bench in shower? No  Elevated toilet seat or a handicapped toilet? Yes   TIMED UP AND GO:  Was  the test performed? No .    Cognitive Function: Normal cognitive status assessed by  this Nurse Health Advisor. No abnormalities found.   MMSE - Mini Mental State Exam 09/08/2019 07/15/2019 01/28/2018 01/10/2017  Orientation to time 5 5 5 5   Orientation to Place 5 5 5 5   Registration 3 3 3 3   Attention/ Calculation 3 5 0 0  Recall 3 3 3 3   Language- name 2 objects 2 2 0 0  Language- repeat 1 1 1 1   Language- follow 3 step command 3 3 3 3   Language- read & follow direction 1 1 0 0  Write a sentence 1 1 0 0  Copy design 1 1 0 0  Total score 28 30 20 20         Immunizations Immunization History  Administered Date(s) Administered   Fluad Quad(high Dose 65+) 02/09/2020, 03/01/2021   Influenza,inj,Quad PF,6+ Mos 04/12/2017, 01/28/2018, 02/06/2019   Influenza-Unspecified 04/12/2017, 01/28/2018, 02/06/2019   PFIZER(Purple Top)SARS-COV-2 Vaccination 06/10/2019, 07/01/2019, 03/02/2020, 11/23/2020   Pneumococcal Conjugate-13 01/10/2017   Pneumococcal Polysaccharide-23 08/13/2010   Tdap 06/13/2011   Zoster, Live 09/01/2010    TDAP status: Up to date  Flu Vaccine status: Up to date  Pneumococcal vaccine status: Up to date  Covid-19 vaccine status: Information provided on how to obtain vaccines.   Qualifies for Shingles Vaccine? Yes   Zostavax completed Yes   Shingrix Completed?: No.    Education has been provided regarding the importance of this vaccine. Patient has been advised to call insurance company to determine out of pocket expense if they have not yet received this vaccine. Advised may also receive  vaccine at local pharmacy or Health Dept. Verbalized acceptance and understanding.  Screening Tests Health Maintenance  Topic Date Due   Zoster Vaccines- Shingrix (1 of 2) Never done   COVID-19 Vaccine (5 - Booster for Pfizer series) 01/18/2021   TETANUS/TDAP  06/12/2021   Pneumonia Vaccine 67+ Years old  Completed   INFLUENZA VACCINE  Completed   Hepatitis C Screening   Completed   HPV VACCINES  Aged Out    Health Maintenance  Health Maintenance Due  Topic Date Due   Zoster Vaccines- Shingrix (1 of 2) Never done   COVID-19 Vaccine (5 - Booster for Pfizer series) 01/18/2021    Colorectal cancer screening: No longer required.   Lung Cancer Screening: (Low Dose CT Chest recommended if Age 34-80 years, 30 pack-year currently smoking OR have quit w/in 15years.) does qualify. Patient plans on discussing with PCP.     Additional Screening:  Hepatitis C Screening: does qualify; Completed 12/02/19  Vision Screening: Recommended annual ophthalmology exams for early detection of glaucoma and other disorders of the eye. Is the patient up to date with their annual eye exam?  Yes  Who is the provider or what is the name of the office in which the patient attends annual eye exams? Dr. Mearl Latin   Dental Screening: Recommended annual dental exams for proper oral hygiene  Community Resource Referral / Chronic Care Management: CRR required this visit?  No   CCM required this visit?  No      Plan:     I have personally reviewed and noted the following in the patients chart:   Medical and social history Use of alcohol, tobacco or illicit drugs  Current medications and supplements including opioid prescriptions. Patient is not currently taking opioid prescriptions. Functional ability and status Nutritional status Physical activity Advanced directives List of other physicians Hospitalizations, surgeries, and ER visits in previous 12 months Vitals Screenings to include cognitive, depression, and falls Referrals and appointments  In addition, I have reviewed and discussed with patient certain preventive protocols, quality metrics, and best practice recommendations. A written personalized care plan for preventive services as well as general preventive health recommendations were provided to patient.   Due to this being a telephonic visit, the after visit  summary with patients personalized plan was offered to patient via mail or my-chart.  Patient would like to access on my-chart.    Loma Messing, LPN   08/09/9240   Nurse Health Advisor  Nurse Notes: none

## 2021-05-24 ENCOUNTER — Ambulatory Visit (INDEPENDENT_AMBULATORY_CARE_PROVIDER_SITE_OTHER): Payer: Medicare Other

## 2021-05-24 VITALS — Ht 70.0 in | Wt 190.0 lb

## 2021-05-24 DIAGNOSIS — Z0000 Encounter for general adult medical examination without abnormal findings: Secondary | ICD-10-CM | POA: Diagnosis not present

## 2021-05-24 NOTE — Patient Instructions (Signed)
Adam Juarez , Thank you for taking time to complete your Medicare Wellness Visit. I appreciate your ongoing commitment to your health goals. Please review the following plan we discussed and let me know if I can assist you in the future.   Screening recommendations/referrals: Colonoscopy: no longer required Recommended yearly ophthalmology/optometry visit for glaucoma screening and checkup Recommended yearly dental visit for hygiene and checkup  Vaccinations: Influenza vaccine: up to date Pneumococcal vaccine: up to date Tdap vaccine: up to date, completed 06/13/11, due 06/12/21 Shingles vaccine: Discuss with pharmacy   Covid-19: newest booster available at your local pharmacy  Advanced directives: copy on file  Conditions/risks identified: see problem list  Next appointment: Follow up in one year for your annual wellness visit. 05/25/22 @ 11:15am, this will be a telephone visit  Preventive Care 4 Years and Older, Male Preventive care refers to lifestyle choices and visits with your health care provider that can promote health and wellness. What does preventive care include? A yearly physical exam. This is also called an annual well check. Dental exams once or twice a year. Routine eye exams. Ask your health care provider how often you should have your eyes checked. Personal lifestyle choices, including: Daily care of your teeth and gums. Regular physical activity. Eating a healthy diet. Avoiding tobacco and drug use. Limiting alcohol use. Practicing safe sex. Taking low doses of aspirin every day. Taking vitamin and mineral supplements as recommended by your health care provider. What happens during an annual well check? The services and screenings done by your health care provider during your annual well check will depend on your age, overall health, lifestyle risk factors, and family history of disease. Counseling  Your health care provider may ask you questions about  your: Alcohol use. Tobacco use. Drug use. Emotional well-being. Home and relationship well-being. Sexual activity. Eating habits. History of falls. Memory and ability to understand (cognition). Work and work Statistician. Screening  You may have the following tests or measurements: Height, weight, and BMI. Blood pressure. Lipid and cholesterol levels. These may be checked every 5 years, or more frequently if you are over 21 years old. Skin check. Lung cancer screening. You may have this screening every year starting at age 38 if you have a 30-pack-year history of smoking and currently smoke or have quit within the past 15 years. Fecal occult blood test (FOBT) of the stool. You may have this test every year starting at age 69. Flexible sigmoidoscopy or colonoscopy. You may have a sigmoidoscopy every 5 years or a colonoscopy every 10 years starting at age 71. Prostate cancer screening. Recommendations will vary depending on your family history and other risks. Hepatitis C blood test. Hepatitis B blood test. Sexually transmitted disease (STD) testing. Diabetes screening. This is done by checking your blood sugar (glucose) after you have not eaten for a while (fasting). You may have this done every 1-3 years. Abdominal aortic aneurysm (AAA) screening. You may need this if you are a current or former smoker. Osteoporosis. You may be screened starting at age 66 if you are at high risk. Talk with your health care provider about your test results, treatment options, and if necessary, the need for more tests. Vaccines  Your health care provider may recommend certain vaccines, such as: Influenza vaccine. This is recommended every year. Tetanus, diphtheria, and acellular pertussis (Tdap, Td) vaccine. You may need a Td booster every 10 years. Zoster vaccine. You may need this after age 42. Pneumococcal 13-valent conjugate (PCV13)  vaccine. One dose is recommended after age 80. Pneumococcal  polysaccharide (PPSV23) vaccine. One dose is recommended after age 17. Talk to your health care provider about which screenings and vaccines you need and how often you need them. This information is not intended to replace advice given to you by your health care provider. Make sure you discuss any questions you have with your health care provider. Document Released: 05/27/2015 Document Revised: 01/18/2016 Document Reviewed: 03/01/2015 Elsevier Interactive Patient Education  2017 [employer] Prevention in the Home Falls can cause injuries. They can happen to people of all ages. There are many things you can do to make your home safe and to help prevent falls. What can I do on the outside of my home? Regularly fix the edges of walkways and driveways and fix any cracks. Remove anything that might make you trip as you walk through a door, such as a raised step or threshold. Trim any bushes or trees on the path to your home. Use bright outdoor lighting. Clear any walking paths of anything that might make someone trip, such as rocks or tools. Regularly check to see if handrails are loose or broken. Make sure that both sides of any steps have handrails. Any raised decks and porches should have guardrails on the edges. Have any leaves, snow, or ice cleared regularly. Use sand or salt on walking paths during winter. Clean up any spills in your garage right away. This includes oil or grease spills. What can I do in the bathroom? Use night lights. Install grab bars by the toilet and in the tub and shower. Do not use towel bars as grab bars. Use non-skid mats or decals in the tub or shower. If you need to sit down in the shower, use a plastic, non-slip stool. Keep the floor dry. Clean up any water that spills on the floor as soon as it happens. Remove soap buildup in the tub or shower regularly. Attach bath mats securely with double-sided non-slip rug tape. Do not have throw rugs and other  things on the floor that can make you trip. What can I do in the bedroom? Use night lights. Make sure that you have a light by your bed that is easy to reach. Do not use any sheets or blankets that are too big for your bed. They should not hang down onto the floor. Have a firm chair that has side arms. You can use this for support while you get dressed. Do not have throw rugs and other things on the floor that can make you trip. What can I do in the kitchen? Clean up any spills right away. Avoid walking on wet floors. Keep items that you use a lot in easy-to-reach places. If you need to reach something above you, use a strong step stool that has a grab bar. Keep electrical cords out of the way. Do not use floor polish or wax that makes floors slippery. If you must use wax, use non-skid floor wax. Do not have throw rugs and other things on the floor that can make you trip. What can I do with my stairs? Do not leave any items on the stairs. Make sure that there are handrails on both sides of the stairs and use them. Fix handrails that are broken or loose. Make sure that handrails are as long as the stairways. Check any carpeting to make sure that it is firmly attached to the stairs. Fix any carpet that is loose  or worn. Avoid having throw rugs at the top or bottom of the stairs. If you do have throw rugs, attach them to the floor with carpet tape. Make sure that you have a light switch at the top of the stairs and the bottom of the stairs. If you do not have them, ask someone to add them for you. What else can I do to help prevent falls? Wear shoes that: Do not have high heels. Have rubber bottoms. Are comfortable and fit you well. Are closed at the toe. Do not wear sandals. If you use a stepladder: Make sure that it is fully opened. Do not climb a closed stepladder. Make sure that both sides of the stepladder are locked into place. Ask someone to hold it for you, if possible. Clearly  mark and make sure that you can see: Any grab bars or handrails. First and last steps. Where the edge of each step is. Use tools that help you move around (mobility aids) if they are needed. These include: Canes. Walkers. Scooters. Crutches. Turn on the lights when you go into a dark area. Replace any light bulbs as soon as they burn out. Set up your furniture so you have a clear path. Avoid moving your furniture around. If any of your floors are uneven, fix them. If there are any pets around you, be aware of where they are. Review your medicines with your doctor. Some medicines can make you feel dizzy. This can increase your chance of falling. Ask your doctor what other things that you can do to help prevent falls. This information is not intended to replace advice given to you by your health care provider. Make sure you discuss any questions you have with your health care provider. Document Released: 02/24/2009 Document Revised: 10/06/2015 Document Reviewed: 06/04/2014 Elsevier Interactive Patient Education  2017 [employer].

## 2021-05-29 ENCOUNTER — Other Ambulatory Visit (HOSPITAL_COMMUNITY): Payer: Self-pay | Admitting: Cardiovascular Disease

## 2021-05-29 DIAGNOSIS — I714 Abdominal aortic aneurysm, without rupture, unspecified: Secondary | ICD-10-CM

## 2021-06-02 DIAGNOSIS — L570 Actinic keratosis: Secondary | ICD-10-CM | POA: Diagnosis not present

## 2021-06-02 DIAGNOSIS — L538 Other specified erythematous conditions: Secondary | ICD-10-CM | POA: Diagnosis not present

## 2021-06-02 DIAGNOSIS — L820 Inflamed seborrheic keratosis: Secondary | ICD-10-CM | POA: Diagnosis not present

## 2021-06-07 DIAGNOSIS — M79675 Pain in left toe(s): Secondary | ICD-10-CM | POA: Diagnosis not present

## 2021-06-07 DIAGNOSIS — B351 Tinea unguium: Secondary | ICD-10-CM | POA: Diagnosis not present

## 2021-06-07 DIAGNOSIS — M79674 Pain in right toe(s): Secondary | ICD-10-CM | POA: Diagnosis not present

## 2021-06-22 DIAGNOSIS — Z79899 Other long term (current) drug therapy: Secondary | ICD-10-CM | POA: Diagnosis not present

## 2021-06-22 DIAGNOSIS — Z982 Presence of cerebrospinal fluid drainage device: Secondary | ICD-10-CM | POA: Diagnosis not present

## 2021-06-22 DIAGNOSIS — Z8669 Personal history of other diseases of the nervous system and sense organs: Secondary | ICD-10-CM | POA: Insufficient documentation

## 2021-06-22 DIAGNOSIS — I480 Paroxysmal atrial fibrillation: Secondary | ICD-10-CM | POA: Diagnosis not present

## 2021-06-22 DIAGNOSIS — F419 Anxiety disorder, unspecified: Secondary | ICD-10-CM | POA: Diagnosis not present

## 2021-06-22 DIAGNOSIS — R4189 Other symptoms and signs involving cognitive functions and awareness: Secondary | ICD-10-CM | POA: Diagnosis not present

## 2021-06-22 DIAGNOSIS — G912 (Idiopathic) normal pressure hydrocephalus: Secondary | ICD-10-CM | POA: Diagnosis not present

## 2021-06-22 DIAGNOSIS — R4689 Other symptoms and signs involving appearance and behavior: Secondary | ICD-10-CM | POA: Diagnosis not present

## 2021-06-28 ENCOUNTER — Other Ambulatory Visit: Payer: Self-pay | Admitting: Primary Care

## 2021-06-28 DIAGNOSIS — E782 Mixed hyperlipidemia: Secondary | ICD-10-CM

## 2021-06-28 DIAGNOSIS — K219 Gastro-esophageal reflux disease without esophagitis: Secondary | ICD-10-CM

## 2021-06-29 NOTE — Telephone Encounter (Signed)
Patient is due for general follow-up with me in March 2023, please schedule for the end of a session (either morning or afternoon).

## 2021-07-06 DIAGNOSIS — G912 (Idiopathic) normal pressure hydrocephalus: Secondary | ICD-10-CM | POA: Diagnosis not present

## 2021-07-12 ENCOUNTER — Emergency Department: Payer: Medicare Other

## 2021-07-12 ENCOUNTER — Telehealth: Payer: Self-pay

## 2021-07-12 ENCOUNTER — Emergency Department
Admission: EM | Admit: 2021-07-12 | Discharge: 2021-07-12 | Disposition: A | Discharge status: Home / Self Care | Payer: Medicare Other | Attending: Emergency Medicine | Admitting: Emergency Medicine

## 2021-07-12 ENCOUNTER — Ambulatory Visit: Payer: Medicare Other | Admitting: Family

## 2021-07-12 ENCOUNTER — Other Ambulatory Visit: Payer: Self-pay

## 2021-07-12 DIAGNOSIS — D72819 Decreased white blood cell count, unspecified: Secondary | ICD-10-CM | POA: Insufficient documentation

## 2021-07-12 DIAGNOSIS — R1031 Right lower quadrant pain: Secondary | ICD-10-CM

## 2021-07-12 DIAGNOSIS — I714 Abdominal aortic aneurysm, without rupture, unspecified: Secondary | ICD-10-CM | POA: Insufficient documentation

## 2021-07-12 DIAGNOSIS — K5730 Diverticulosis of large intestine without perforation or abscess without bleeding: Secondary | ICD-10-CM | POA: Diagnosis not present

## 2021-07-12 LAB — COMPREHENSIVE METABOLIC PANEL
ALT: 16 U/L (ref 0–44)
AST: 25 U/L (ref 15–41)
Albumin: 4.4 g/dL (ref 3.5–5.0)
Alkaline Phosphatase: 63 U/L (ref 38–126)
Anion gap: 11 (ref 5–15)
BUN: 20 mg/dL (ref 8–23)
CO2: 22 mmol/L (ref 22–32)
Calcium: 9.6 mg/dL (ref 8.9–10.3)
Chloride: 105 mmol/L (ref 98–111)
Creatinine, Ser: 0.89 mg/dL (ref 0.61–1.24)
GFR, Estimated: >60 mL/min (ref >60)
Glucose, Bld: 147 mg/dL — ABNORMAL HIGH (ref 70–99)
Potassium: 4.2 mmol/L (ref 3.5–5.1)
Sodium: 138 mmol/L (ref 135–145)
Total Bilirubin: 1.7 mg/dL — ABNORMAL HIGH (ref 0.3–1.2)
Total Protein: 7.4 g/dL (ref 6.5–8.1)

## 2021-07-12 LAB — URINALYSIS, ROUTINE W REFLEX MICROSCOPIC
Bilirubin Urine: NEGATIVE
Glucose, UA: NEGATIVE mg/dL
Hgb urine dipstick: NEGATIVE
Ketones, ur: NEGATIVE mg/dL
Leukocytes,Ua: NEGATIVE
Nitrite: NEGATIVE
Protein, ur: NEGATIVE mg/dL
Specific Gravity, Urine: 1.021 (ref 1.005–1.030)
pH: 6 (ref 5.0–8.0)

## 2021-07-12 LAB — CBC
HCT: 43.6 % (ref 39.0–52.0)
Hemoglobin: 14.8 g/dL (ref 13.0–17.0)
MCH: 32.6 pg (ref 26.0–34.0)
MCHC: 33.9 g/dL (ref 30.0–36.0)
MCV: 96 fL (ref 80.0–100.0)
Platelets: 123 10*3/uL — ABNORMAL LOW (ref 150–400)
RBC: 4.54 MIL/uL (ref 4.22–5.81)
RDW: 12.7 % (ref 11.5–15.5)
WBC: 3.9 10*3/uL — ABNORMAL LOW (ref 4.0–10.5)
nRBC: 0 % (ref 0.0–0.2)

## 2021-07-12 LAB — LIPASE, BLOOD: Lipase: 36 U/L (ref 11–51)

## 2021-07-12 MED ORDER — IOHEXOL 350 MG/ML SOLN
100.0000 mL | Freq: Once | INTRAVENOUS | Status: AC | PRN
  Administered 2021-07-12: 100 mL via INTRAVENOUS

## 2021-07-12 NOTE — Telephone Encounter (Signed)
I spoke with pt;starting on 07/10/21 late afternoon or early evening pt has intermittent sharp pain on rt side at waistline that usually last at least 2 hrs at pain level of 8 - 9.. Pt may have pain on lt side but not as intense and not as often. Pt said no N & V, no diarrhea and no constipation and no fever. Last BM was 07/11/21 and was normal; no blood or mucus. No burning, pain or frequency of urine. Pt has no known injury. Pt said sometimes if sits in recliner and is very still that helps with pain level on rt side. Throughout the conversation pt had some confusion but pt said he just woke up and was foggy. Pt  initially told me when I ask where his pain was he said behind the rt ear and forehead. And when I ask if he was having right side pain he said yes that was it and pt got his wife on speaker phone with him. 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 Red Christians FNP said pt needs to go to ED for eval. Pt said he would go to [facility] ED and did not need 911 called. Pt was in no pain at this time. Sending note to T Dugal FNP and Gentry Fitz NP who is out of office but FYI as PCP. ?

## 2021-07-12 NOTE — Telephone Encounter (Deleted)
11:40 pm or 12:00 pm slot available on March the 24th. I can see if the pt wants to take one of those times.  ?

## 2021-07-12 NOTE — ED Provider Notes (Signed)
? ?[facility] ?Provider Note ? ? ? Event Date/Time  ? First MD Initiated Contact with Patient 07/12/21 1015   ?  (approximate) ? ? ?History  ? ?Abdominal Pain ? ? ?HPI ? ?Sparrow Sanzo is a 81 y.o. male with a history of normal pressure hydrocephalus, shunt, abdominal aneurysm kidney stones hyperlipidemia ? ?For about a month now has been experiencing intermittent pain over his right lower abdomen.  It comes and goes presently its not present, but seems to be aggravated by being up moving about.  No weakness or numbness.  No change in his legs.  Pain comes and goes.  No nausea vomiting no fevers.  The ? ?Last pain was notable and is a very severe crampy pain sometimes it radiates slightly to his left side but normally sits in the right lower abdomen.  Sort of just around or below his pelvic bone.  No groin swelling.  No pain in his testicles or penis.  No difficulty urinating ? ?No back or flank pain.  No chest pain or trouble breathing.  Pain is located deep in his right lower abdomen.  No diarrhea ?  ? ? ?Physical Exam  ? ?Triage Vital Signs: ?ED Triage Vitals  ?Enc Vitals Group  ?   BP 07/12/21 0956 113/68  ?   Pulse Rate 07/12/21 0956 72  ?   Resp 07/12/21 0956 20  ?   Temp 07/12/21 0956 97.9 ?F (36.6 ?C)  ?   Temp Source 07/12/21 0956 Oral  ?   SpO2 07/12/21 0956 97 %  ?   Weight 07/12/21 0947 191 lb (86.6 kg)  ?   Height 07/12/21 0947 5\' 10"  (1.778 m)  ?   Head Circumference --   ?   Peak Flow --   ?   Pain Score 07/12/21 0947 3  ?   Pain Loc --   ?   Pain Edu? --   ?   Excl. in [place]? --   ? ? ?Most recent vital signs: ?Vitals:  ? 07/12/21 1315 07/12/21 1415  ?BP: 138/71 (!) 150/79  ?Pulse: 64 65  ?Resp: 18 18  ?Temp:    ?SpO2: 95% 95%  ? ? ? ?General: Awake, no distress.  Very pleasant.  Currently reports being in no pain. ?CV:  Good peripheral perfusion.  Warm lower extremities bilateral.  Normal rate and rhythm without murmur ?Resp:  Normal effort.  Clear lung sounds bilateral ?Abd:  No  distention.  Abdomen soft nontender through all quadrants at this time.  Does report a discomfort to deep palpation along the right lower quadrant.  No hernias masses or swelling along the inguinal regions or pubic region.  Normal appearance of the penis ?Other:   ? ? ?ED Results / Procedures / Treatments  ? ?Labs ?(all labs ordered are listed, but only abnormal results are displayed) ?Labs Reviewed  ?COMPREHENSIVE METABOLIC PANEL - Abnormal; Notable for the following components:  ?    Result Value  ? Glucose, Bld 147 (*)   ? Total Bilirubin 1.7 (*)   ? All other components within normal limits  ?CBC - Abnormal; Notable for the following components:  ? WBC 3.9 (*)   ? Platelets 123 (*)   ? All other components within normal limits  ?URINALYSIS, ROUTINE W REFLEX MICROSCOPIC - Abnormal; Notable for the following components:  ? Color, Urine YELLOW (*)   ? APPearance CLEAR (*)   ? All other components within normal limits  ?LIPASE, BLOOD  ? ? ? ?  No chest pain.  No pain in the upper abdomen.  All pain is located in the deep right lower quadrant.  No cardiac symptoms ? ? ? ? ?RADIOLOGY ? ? ?Personally viewed images of the patient's CT angiogram, appears grossly unchanged with regard to vascular findings and I do not see any clear or obvious acute gross pathology in the abdomen pelvis.  Defer to radiologist though for more detailed and specific read, as below ? ?CT Angio Abd/Pel W and/or Wo Contrast ?1. Abdominal aortic aneurysm measuring up to 4.2 x 3.4 cm is unchanged from prior exam. Recommend follow-up every 12 months and vascular consultation. This recommendation follows ACR consensus guidelines: White Paper of the ACR Incidental Findings Committee II on Vascular Findings. J Am Coll Radiol 2013; 10:789-794. 2. Mildly dilated bilateral common iliac arteries measuring up to 1.8 cm each, are unchanged from prior examination. 3. Rounded density in the right AV groove best seen on image 3 of series 7 may be part of the  right atrium, however could also represent a coronary artery aneurysm. Further evaluation with CT angiography of the chest should be performed. NON-VASCULAR 1. No acute abnormality of the abdomen or pelvis. 2. Colonic diverticulosis. Electronically Signed: By: Miachel Roux M.D. On: 07/12/2021 12:55   ? ? ? ? ?PROCEDURES: ? ?Critical Care performed: No ? ?Procedures ? ? ?MEDICATIONS ORDERED IN ED: ?Medications  ?iohexol (OMNIPAQUE) 350 MG/ML injection 100 mL (100 mLs Intravenous Contrast Given 07/12/21 1122)  ? ? ? ?IMPRESSION / MDM / ASSESSMENT AND PLAN / ED COURSE  ?I reviewed the triage vital signs and the nursing notes. ?             ?               ? ?Differential diagnosis includes but is not limited to, abdominal perforation, aortic dissection, cholecystitis, appendicitis, diverticulitis, colitis, esophagitis/gastritis, kidney stone, pyelonephritis, urinary tract infection, aortic aneurysm. All are considered in decision and treatment plan. Based upon the patient's presentation and risk factors, given known history of aortic aneurysm and iliac aneurysm, will obtain CT angiography to further evaluate for etiology of pain.  My pretest probability though of an acute vascular etiology is very low, but given the present history I do think CT angiography would be the most warranted study of the abdomen and pelvis.  Discussed with the patient is understanding agreeable to plan.  Currently does not wish for anything for pain or discomfort is not currently in any pain at this time. ? ? ?The patient is on the cardiac monitor to evaluate for evidence of arrhythmia and/or significant heart rate changes. ? ?6 CMP reviewed, reassuring, minimally elevated bilirubin.  White blood cell count mild leukopenia, but compared with previous his baseline of white count seems to run around 4. ? ?Urinalysis negative for acute ? ?Clinical Course as of 07/12/21 1438  ?Wed Jul 12, 2021  ?1411 Discussed with Dr. Garen Lah CTA findings.  Recommends outpatient follow-up DR. Gollan. Given no chest symptoms, I agree with this as well. Patient will followup with both Dr. Rockey Situ and vascular MD.  [MQ]  ?  ?Clinical Course User Index ?[MQ] Delman Kitten, MD  ? ?----------------------------------------- ?2:38 PM on 07/12/2021 ?----------------------------------------- ?Patient discharged home, very much agreeable with plan to follow-up with his cardiology team as well as vascular surgery.  He is well aware of his known aortic aneurysm, and will also follow-up with cardiology ? ?Return precautions and treatment recommendations and follow-up discussed with the  patient who is agreeable with the plan. ? ?Fully alert, well oriented no distress nontoxic at time of discharge ambulatory without difficulty ? ?FINAL CLINICAL IMPRESSION(S) / ED DIAGNOSES  ? ?Final diagnoses:  ?Right lower quadrant abdominal pain  ?Abdominal aortic aneurysm (AAA) without rupture, unspecified part  ? ? ? ?Rx / DC Orders  ? ?ED Discharge Orders   ? ?      Ordered  ?  Ambulatory referral to Cardiology       ?Comments: ? Coronary aneursym  ? 07/12/21 1428  ? ?  ?  ? ?  ? ? ? ?Note:  This document was prepared using Dragon voice recognition software and may include unintentional dictation errors. ?  Delman Kitten, MD ?07/12/21 1439 ? ?

## 2021-07-12 NOTE — Telephone Encounter (Signed)
Tried to reach out to pt (unavailable) but I see that you have an 11:40 pm or 12:00 pm slot on March the 24th. I can see if the pt wants to take one of those times. ?

## 2021-07-12 NOTE — ED Triage Notes (Signed)
Pt c/o intermittent RLQ pain over the past month, pt has a Shunt and has had issues in the past of it getting tangled with is intestines, pt is in NAD on arrival ?

## 2021-07-12 NOTE — Telephone Encounter (Signed)
Agree with advice provided.  °

## 2021-08-04 ENCOUNTER — Encounter: Payer: Self-pay | Admitting: Nurse Practitioner

## 2021-08-04 ENCOUNTER — Other Ambulatory Visit: Payer: Self-pay

## 2021-08-04 ENCOUNTER — Ambulatory Visit (INDEPENDENT_AMBULATORY_CARE_PROVIDER_SITE_OTHER): Payer: Medicare Other | Admitting: Nurse Practitioner

## 2021-08-04 ENCOUNTER — Ambulatory Visit (INDEPENDENT_AMBULATORY_CARE_PROVIDER_SITE_OTHER)
Admission: RE | Admit: 2021-08-04 | Discharge: 2021-08-04 | Disposition: A | Discharge status: Home / Self Care | Payer: Medicare Other | Source: Ambulatory Visit | Attending: Nurse Practitioner | Admitting: Nurse Practitioner

## 2021-08-04 VITALS — BP 108/70 | HR 65 | Temp 97.5°F | Resp 12 | Ht 70.0 in | Wt 199.0 lb

## 2021-08-04 DIAGNOSIS — R1084 Generalized abdominal pain: Secondary | ICD-10-CM

## 2021-08-04 DIAGNOSIS — R17 Unspecified jaundice: Secondary | ICD-10-CM | POA: Diagnosis not present

## 2021-08-04 DIAGNOSIS — K5900 Constipation, unspecified: Secondary | ICD-10-CM | POA: Diagnosis not present

## 2021-08-04 LAB — HEPATIC FUNCTION PANEL
ALT: 15 U/L (ref 0–53)
AST: 19 U/L (ref 0–37)
Albumin: 4.5 g/dL (ref 3.5–5.2)
Alkaline Phosphatase: 62 U/L (ref 39–117)
Bilirubin, Direct: 0.2 mg/dL (ref 0.0–0.3)
Total Bilirubin: 1.3 mg/dL — ABNORMAL HIGH (ref 0.2–1.2)
Total Protein: 7 g/dL (ref 6.0–8.3)

## 2021-08-04 NOTE — Assessment & Plan Note (Signed)
Incidental finding on lab work from emergency department.  Will order hepatic function panel, pending result. ?

## 2021-08-04 NOTE — Patient Instructions (Signed)
Nice to see you today ?Keep the appointment with Dr. Rockey Situ ?Follow up if no improvement ?I will be in touch with results once I have them ?

## 2021-08-04 NOTE — Progress Notes (Signed)
? ?Acute Office Visit ? ?Subjective:  ? ? Patient ID: Adam Juarez, male    DOB: 27-May-1940, 81 y.o.   MRN: [id number] ? ?Chief Complaint  ?Patient presents with  ? Abdominal Pain  ?  Did go to ER on 07/12/21. Pain usually in the RLQ mainly but sometimes in the LLQ. Has gotten worse. No further work up has been done since ER visit. He has been constipated for the past 2 days, usually has a bowel movement 2 to 3 times daily.  ? ? ? ?Patient is in today for abdominal pain  ? ?States that he went to the ED on 07/12/2021 and was evaluated and got a CT scan performed. It showed the known aneurysms.  Also showed known diverticulosis and some incidental findings of the umbilical hernia and bilateral fat-containing inguinal hernias. ? ?States since the emergency depart he was free of symptoms for a couple weeks. States that this past weekend he had symptoms that started on the right side and then abated. Then would come back and go to the left side. ?Sharp stabbing to dull achy pain and cramping pain. Intermittent in nature. Dull achy pain that is 2/10 and that is constant. State that he has tried tylenol and it will dull after appox an hour. ? ?laying down makes it better. Heating pad and '1000mg'$  tylenol ?States BM "moose nugget" yesterday and then 2 days prior to that was a normal BM. ? ?Past Medical History:  ?Diagnosis Date  ? Arthritis   ? fingers  ? 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 Chicken pox   ? Depression   ? Dysrhythmia 09/2013  ? Hx. a-fib x 1 episode patient states he "auto corrected" seen at [facility]  ? GERD (gastroesophageal reflux disease)   ? Headache   ? poor posture - none recently  ? History of kidney stones   ? Hydrocephalus ([place])   ? Hyperlipidemia   ? Neuropathy   ? PSVT (paroxysmal supraventricular tachycardia) ([place]) 2009  ? Controlled with "breathing process"  ? Renal stone 9/08, 6/15  ? Wears dentures   ? full upper  ? ? ?Past Surgical History:  ?Procedure Laterality Date  ?  CARDIAC CATHETERIZATION  2008  ? [facility], [place]  ? CATARACT EXTRACTION W/PHACO Left 07/27/2015  ? Procedure: CATARACT EXTRACTION PHACO AND INTRAOCULAR LENS PLACEMENT (IOC);  Surgeon: Leandrew Koyanagi, MD;  Location: [place];  Service: Ophthalmology;  Laterality: Left;  TORIC  ? CATARACT EXTRACTION W/PHACO Right 08/24/2015  ? Procedure: CATARACT EXTRACTION PHACO AND INTRAOCULAR LENS PLACEMENT (IOC);  Surgeon: Leandrew Koyanagi, MD;  Location: [place];  Service: Ophthalmology;  Laterality: Right;  TORIC  ? COLONOSCOPY WITH PROPOFOL N/A 08/10/2020  ? Procedure: COLONOSCOPY WITH PROPOFOL;  Surgeon: Toledo, Benay Pike, MD;  Location: [facility] ENDOSCOPY;  Service: Gastroenterology;  Laterality: N/A;  ? HEMORROIDECTOMY  2010  ? banding  ? IR ANGIO INTRA EXTRACRAN SEL COM CAROTID INNOMINATE BILAT MOD SED  10/30/2019  ? IR ANGIO VERTEBRAL SEL VERTEBRAL UNI R MOD SED  10/30/2019  ? IR RADIOLOGIST EVAL & MGMT  04/11/2020  ? IR US GUIDE VASC ACCESS RIGHT  10/30/2019  ? KIDNEY STONE SURGERY  9/08, 6/15  ? lithotripsy  ? LAPAROSCOPIC REVISION VENTRICULAR-PERITONEAL (V-P) SHUNT  01/08/2020  ? Procedure: LAPAROSCOPIC REVISION VENTRICULAR-PERITONEAL (V-P) SHUNT;  Surgeon: Consuella Lose, MD;  Location: [place];  Service: Neurosurgery;;  ? PROSTATE BIOPSY  2007  ? SKIN CANCER EXCISION    ?  TOENAIL TRIMMING  11/2017  ? Dr. Elvina Mattes  ? TONSILLECTOMY    ? VASECTOMY    ? VENTRICULOPERITONEAL SHUNT Right 01/08/2020  ? Procedure: SHUNT INSERTION VENTRICULAR-PERITONEAL;  Surgeon: Consuella Lose, MD;  Location: [place];  Service: Neurosurgery;  Laterality: Right;  ? ? ?Family History  ?Problem Relation Age of Onset  ? AAA (abdominal aortic aneurysm) Father   ? Parkinson's disease Mother   ? Arthritis Mother   ? ? ?Social History  ? ?Socioeconomic History  ? Marital status: Married  ?  Spouse name: Not on file  ? Number of children: Not on file  ? Years of education: Not on file  ? Highest education level: Not on file   ?Occupational History  ? Not on file  ?Tobacco Use  ? Smoking status: Former  ?  Packs/day: 0.50  ?  Years: 38.00  ?  Pack years: 19.00  ?  Types: Cigarettes  ?  Quit date: 07/13/1997  ?  Years since quitting: 24.0  ? Smokeless tobacco: Never  ?Vaping Use  ? Vaping Use: Never used  ?Substance and Sexual Activity  ? Alcohol use: No  ? Drug use: No  ? Sexual activity: Not on file  ?Other Topics Concern  ? Not on file  ?Social History Narrative  ? Not on file  ? ?Social Determinants of Health  ? ?Financial Resource Strain: Low Risk   ? Difficulty of Paying Living Expenses: Not hard at all  ?Food Insecurity: No Food Insecurity  ? Worried About Charity fundraiser in the Last Year: Never true  ? Ran Out of Food in the Last Year: Never true  ?Transportation Needs: No Transportation Needs  ? Lack of Transportation (Medical): No  ? Lack of Transportation (Non-Medical): No  ?Physical Activity: Insufficiently Active  ? Days of Exercise per Week: 2 days  ? Minutes of Exercise per Session: 10 min  ?Stress: No Stress Concern Present  ? Feeling of Stress : Not at all  ?Social Connections: Socially Integrated  ? Frequency of Communication with Friends and Family: More than three times a week  ? Frequency of Social Gatherings with Friends and Family: Three times a week  ? Attends Religious Services: More than 4 times per year  ? Active Member of Clubs or Organizations: Yes  ? Attends Archivist Meetings: More than 4 times per year  ? Marital Status: Married  ?Intimate Partner Violence: Not At Risk  ? Fear of Current or Ex-Partner: No  ? Emotionally Abused: No  ? Physically Abused: No  ? Sexually Abused: No  ? ? ?Outpatient Medications Prior to Visit  ?Medication Sig Dispense Refill  ? acetaminophen (TYLENOL) 500 MG tablet Take 1,000 mg by mouth every 8 (eight) hours as needed for moderate pain.    ? apixaban (ELIQUIS) 5 MG TABS tablet Take 1 tablet (5 mg total) by mouth 2 (two) times daily. 180 tablet 3  ? ascorbic acid  (VITAMIN C) 500 MG tablet Take 500 mg by mouth daily.    ? atorvastatin (LIPITOR) 20 MG tablet Take 1 tablet (20 mg total) by mouth daily. for cholesterol. Office visit required for further refills. 90 tablet 0  ? calcium carbonate (TUMS - DOSED IN MG ELEMENTAL CALCIUM) 500 MG chewable tablet Chew 1 tablet by mouth. As needed    ? escitalopram (LEXAPRO) 5 MG tablet Take 5 mg by mouth daily.    ? hydrocortisone (ANUSOL-HC) 25 MG suppository Place 1 suppository (25 mg total) rectally  2 (two) times daily. 30 suppository 0  ? Magnesium Cl-Calcium Carbonate (SLOW-MAG PO) Take 1 tablet by mouth daily.     ? Magnesium Oxide 500 MG TABS Take by mouth.    ? Melatonin 1 MG CAPS Take 1 mg by mouth at bedtime as needed (sleep).    ? metoprolol succinate (TOPROL-XL) 25 MG 24 hr tablet TAKE 1 TABLET IN THE MORNING AND AT BEDTIME 180 tablet 2  ? Multiple Vitamin (MULTIVITAMIN) tablet Take 1 tablet by mouth daily.    ? Multiple Vitamins-Minerals (PRESERVISION AREDS 2 PO) Take 1 capsule by mouth 2 (two) times daily.     ? omeprazole (PRILOSEC) 20 MG capsule Take 1 capsule (20 mg total) by mouth daily. For heartburn. Office visit required for further refills. 90 capsule 0  ? Polyethyl Glycol-Propyl Glycol (SYSTANE OP) Apply 1 drop to eye 3 (three) times daily as needed (dry eyes).    ? Potassium Gluconate 550 MG TABS Take 550 mg by mouth daily.    ? Probiotic Product (PROBIOTIC DAILY PO) Take 1 capsule by mouth daily.     ? vitamin B-12 (CYANOCOBALAMIN) 1000 MCG tablet Take 1,000 mcg by mouth daily.    ? diclofenac Sodium (VOLTAREN) 1 % GEL Apply 1 application topically 4 (four) times daily as needed (pain). (Patient not taking: Reported on 07/12/2021)    ? EPINEPHRINE 0.3 mg/0.3 mL IJ SOAJ injection INJECT 0.3 ML (0.3 MG TOTAL) INTO THE MUSCLE AS NEEDED FOR ANAPHYLAXIS (Patient not taking: Reported on 08/04/2021) 2 each 1  ? ?No facility-administered medications prior to visit.  ? ? ?Allergies  ?Allergen Reactions  ? Bee Venom  Anaphylaxis and Hives  ? Other Other (See Comments)  ?  Pistachios - tickles throat ? ?Pistachios - tickles throat  ? Tramadol Other (See Comments)  ? ? ?Review of Systems  ?Constitutional:  Negative for appetite cha

## 2021-08-04 NOTE — Assessment & Plan Note (Signed)
Patient was evaluated emergency department 07/12/2021.  CT angio abdomen pelvis performed no acute abnormality did show known aortic aneurysm and iliac artery aneurysms.  Did show some diverticulosis but no sign of diverticulitis.  Did show a umbilical hernia and to fat-containing inguinal hernias bilaterally.  Will obtain flatplate today to rule out constipation.  He does have follow-up with cardiologist coming up in regards for monitoring for aneurysms.  Blood pressure well controlled in office.  Did discuss maybe using antispasmodic such as Bentyl or Levsin with patient if work-up comes back benign.  At time of closing chart flatplate x-ray was back and negative for acute abnormalities.  Both CT and flatplate x-ray did show tip of shot appropriately placed in intestines without sign of infection or abnormality. ?

## 2021-08-07 ENCOUNTER — Other Ambulatory Visit: Payer: Self-pay | Admitting: Cardiovascular Disease

## 2021-08-08 ENCOUNTER — Encounter (HOSPITAL_COMMUNITY): Payer: Self-pay | Admitting: Radiology

## 2021-08-09 ENCOUNTER — Telehealth: Payer: Self-pay | Admitting: Nurse Practitioner

## 2021-08-09 NOTE — Telephone Encounter (Signed)
-----   Message from [place] sent at 08/08/2021  4:00 PM EDT ----- ?Patient saw results on mychart xray and lab results. Matt, did you want me to follow up with patient to see if he wanted to try Bentyl? Or just will await patient's call back if needed? ?

## 2021-08-09 NOTE — Telephone Encounter (Signed)
We can reach out and see what his thoughts are please ?

## 2021-08-09 NOTE — Telephone Encounter (Signed)
Patient advised. Patient would like to wait and not start any additional medication ?

## 2021-08-09 NOTE — Telephone Encounter (Signed)
-----   Message from [place] sent at 08/08/2021  4:00 PM EDT ----- ?Patient saw results on mychart xray and lab results. Matt, did you want me to follow up with patient to see if he wanted to try Bentyl? Or just will await patient's call back if needed? ?

## 2021-08-09 NOTE — Telephone Encounter (Signed)
We can see if he wants to try the medication please ?

## 2021-08-17 DIAGNOSIS — I671 Cerebral aneurysm, nonruptured: Secondary | ICD-10-CM | POA: Diagnosis not present

## 2021-08-17 DIAGNOSIS — R4689 Other symptoms and signs involving appearance and behavior: Secondary | ICD-10-CM | POA: Diagnosis not present

## 2021-08-17 DIAGNOSIS — R200 Anesthesia of skin: Secondary | ICD-10-CM | POA: Diagnosis not present

## 2021-08-17 DIAGNOSIS — G912 (Idiopathic) normal pressure hydrocephalus: Secondary | ICD-10-CM | POA: Diagnosis not present

## 2021-08-17 DIAGNOSIS — R4189 Other symptoms and signs involving cognitive functions and awareness: Secondary | ICD-10-CM | POA: Diagnosis not present

## 2021-08-17 DIAGNOSIS — Z8679 Personal history of other diseases of the circulatory system: Secondary | ICD-10-CM | POA: Diagnosis not present

## 2021-08-17 DIAGNOSIS — I480 Paroxysmal atrial fibrillation: Secondary | ICD-10-CM | POA: Diagnosis not present

## 2021-08-17 DIAGNOSIS — F419 Anxiety disorder, unspecified: Secondary | ICD-10-CM | POA: Diagnosis not present

## 2021-08-31 DIAGNOSIS — Z961 Presence of intraocular lens: Secondary | ICD-10-CM | POA: Diagnosis not present

## 2021-08-31 DIAGNOSIS — H353132 Nonexudative age-related macular degeneration, bilateral, intermediate dry stage: Secondary | ICD-10-CM | POA: Diagnosis not present

## 2021-08-31 DIAGNOSIS — H43813 Vitreous degeneration, bilateral: Secondary | ICD-10-CM | POA: Diagnosis not present

## 2021-08-31 DIAGNOSIS — H16143 Punctate keratitis, bilateral: Secondary | ICD-10-CM | POA: Diagnosis not present

## 2021-08-31 DIAGNOSIS — H3554 Dystrophies primarily involving the retinal pigment epithelium: Secondary | ICD-10-CM | POA: Diagnosis not present

## 2021-08-31 DIAGNOSIS — H26493 Other secondary cataract, bilateral: Secondary | ICD-10-CM | POA: Diagnosis not present

## 2021-08-31 DIAGNOSIS — H35363 Drusen (degenerative) of macula, bilateral: Secondary | ICD-10-CM | POA: Diagnosis not present

## 2021-08-31 DIAGNOSIS — H3533 Angioid streaks of macula: Secondary | ICD-10-CM | POA: Diagnosis not present

## 2021-08-31 DIAGNOSIS — H04123 Dry eye syndrome of bilateral lacrimal glands: Secondary | ICD-10-CM | POA: Diagnosis not present

## 2021-09-08 DIAGNOSIS — B351 Tinea unguium: Secondary | ICD-10-CM | POA: Diagnosis not present

## 2021-09-08 DIAGNOSIS — M79674 Pain in right toe(s): Secondary | ICD-10-CM | POA: Diagnosis not present

## 2021-09-08 DIAGNOSIS — M79675 Pain in left toe(s): Secondary | ICD-10-CM | POA: Diagnosis not present

## 2021-09-12 NOTE — Progress Notes (Signed)
?  ?  ? ?Date:  09/13/2021  ? ?ID:  Adam Juarez, DOB 01-26-1941, MRN [id number] ? ?Patient Location:  ?[place] [address]  ? ?Provider location:   ?[place], [employer] office ? ?PCP:  Pleas Koch, NP  ?Cardiologist:  [facility] ? ?Chief Complaint  ?Patient presents with  ? [facility] follow up; AAA  ?  Patient c/o shortness of breath with over exertion. Medications reviewed by the patient verbally.   ? ? ?History of Present Illness:   ? ?Adam Juarez is a 81 y.o. 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 ?LOV 6/22 ?In the Er for abdominal pain, CT ABD performed showing stable AAA 4.2 cm ?Stable on CT compared to 2020 ? felt to have constipation now taking home remedies ? ?Seizure like episodes, followed by Dr. 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 ?Has not appreciated tachypalpitations concerning for atrial fibrillation ? ?Tolerating Eliquis no significant bleeding ?Reports blood pressure well controlled ?No regular exercise program ? ?Other cardiac testing reviewed ?Stress test end of 2020 no ischemia ? ?EKG personally reviewed by myself on todays visit ?NSR rate 61, PVC no significant ST-T wave changes ? ?Other past medical history reviewed ?In the ER with back pain, 03/12/19 ? ?CT AB ?4.1 cm infrarenal abdominal aortic aneurysm without complicating features.  ?Descending and proximal sigmoid diverticulosis. 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 Cari Caraway  ? ?Through the stress, no arrhythmia ? ?Stress test:03/02/2019 reviewed with him ?There was no ST segment deviation noted during stress. ?No T wave inversion was noted during stress. ?Defect 1: There is a medium defect of mild severity present in the basal inferior and mid inferior location. This is likely due to diaphragm attenuation. ?The study is normal. ?This is a low risk study. ?The left ventricular ejection fraction is normal (55-65%). ? ? ?CHADS VASC 3 ? ?Past Medical History:  ?Diagnosis Date  ? Arthritis   ? fingers  ? 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 Chicken pox   ? Depression   ? Dysrhythmia 09/2013  ? Hx. a-fib x 1 episode patient states he "auto corrected" seen at [facility]  ? GERD (gastroesophageal reflux disease)   ? Headache   ? poor posture - none recently  ? History of kidney stones   ? Hydrocephalus ([place])   ? Hyperlipidemia   ? Neuropathy   ? PSVT (paroxysmal supraventricular tachycardia) ([place]) 2009  ? Controlled with "breathing process"  ? Renal stone 9/08, 6/15  ? Wears dentures   ? full upper  ? ?Past Surgical History:  ?Procedure Laterality Date  ? CARDIAC CATHETERIZATION  2008  ? [facility], [place]  ? CATARACT EXTRACTION W/PHACO Left 07/27/2015  ? Procedure: CATARACT EXTRACTION PHACO AND INTRAOCULAR LENS PLACEMENT (IOC);  Surgeon: Leandrew Koyanagi, MD;  Location: [place];  Service: Ophthalmology;  Laterality: Left;  TORIC  ? CATARACT EXTRACTION W/PHACO Right 08/24/2015  ?  Procedure: CATARACT EXTRACTION PHACO AND INTRAOCULAR LENS PLACEMENT (IOC);  Surgeon: Leandrew Koyanagi, MD;  Location: [place];  Service: Ophthalmology;  Laterality: Right;  TORIC  ? COLONOSCOPY WITH PROPOFOL N/A 08/10/2020  ? Procedure: COLONOSCOPY WITH PROPOFOL;  Surgeon: Toledo, Benay Pike, MD;  Location: [facility] ENDOSCOPY;  Service: Gastroenterology;  Laterality: N/A;  ? HEMORROIDECTOMY  2010  ? banding  ? IR ANGIO  INTRA EXTRACRAN SEL COM CAROTID INNOMINATE BILAT MOD SED  10/30/2019  ? IR ANGIO VERTEBRAL SEL VERTEBRAL UNI R MOD SED  10/30/2019  ? IR RADIOLOGIST EVAL & MGMT  04/11/2020  ? IR US GUIDE VASC ACCESS RIGHT  10/30/2019  ? KIDNEY STONE SURGERY  9/08, 6/15  ? lithotripsy  ? LAPAROSCOPIC REVISION VENTRICULAR-PERITONEAL (V-P) SHUNT  01/08/2020  ? Procedure: LAPAROSCOPIC REVISION VENTRICULAR-PERITONEAL (V-P) SHUNT;  Surgeon: Consuella Lose, MD;  Location: [place];  Service: Neurosurgery;;  ? PROSTATE BIOPSY  2007  ? SKIN CANCER EXCISION    ? TOENAIL TRIMMING  11/2017  ? Dr. Elvina Mattes  ? TONSILLECTOMY    ? VASECTOMY    ? VENTRICULOPERITONEAL SHUNT Right 01/08/2020  ? Procedure: SHUNT INSERTION VENTRICULAR-PERITONEAL;  Surgeon: Consuella Lose, MD;  Location: [place];  Service: Neurosurgery;  Laterality: Right;  ?  ? ?Current Meds  ?Medication Sig  ? acetaminophen (TYLENOL) 500 MG tablet Take 1,000 mg by mouth every 8 (eight) hours as needed for moderate pain.  ? apixaban (ELIQUIS) 5 MG TABS tablet Take 1 tablet (5 mg total) by mouth 2 (two) times daily.  ? ascorbic acid (VITAMIN C) 500 MG tablet Take 500 mg by mouth daily.  ? atorvastatin (LIPITOR) 20 MG tablet Take 1 tablet (20 mg total) by mouth daily. for cholesterol. Office visit required for further refills.  ? calcium carbonate (TUMS - DOSED IN MG ELEMENTAL CALCIUM) 500 MG chewable tablet Chew 1 tablet by mouth. As needed  ? escitalopram (LEXAPRO) 5 MG tablet Take 5 mg by mouth daily.  ? hydrocortisone (ANUSOL-HC) 25 MG suppository Place 1 suppository (25 mg total) rectally 2 (two) times daily.  ? lamoTRIgine (LAMICTAL) 25 MG tablet Take 25 mg by mouth daily.  ? Magnesium Cl-Calcium Carbonate (SLOW-MAG PO) Take 1 tablet by mouth daily.   ? Magnesium Oxide 500 MG TABS Take by mouth.  ? Melatonin 1 MG CAPS Take 1 mg by mouth at bedtime as needed (sleep).  ? metoprolol succinate (TOPROL-XL) 25 MG 24 hr tablet TAKE 1 TABLET IN THE MORNING AND AT BEDTIME  ? Multiple Vitamin  (MULTIVITAMIN) tablet Take 1 tablet by mouth daily.  ? Multiple Vitamins-Minerals (PRESERVISION AREDS 2 PO) Take 1 capsule by mouth 2 (two) times daily.   ? omeprazole (PRILOSEC) 20 MG capsule Take 1 capsule (20 mg total) by mouth daily. For heartburn. Office visit required for further refills.  ? Polyethyl Glycol-Propyl Glycol (SYSTANE OP) Apply 1 drop to eye 3 (three) times daily as needed (dry eyes).  ? Potassium Gluconate 550 MG TABS Take 550 mg by mouth daily.  ? Probiotic Product (PROBIOTIC DAILY PO) Take 1 capsule by mouth daily.   ? vitamin B-12 (CYANOCOBALAMIN) 1000 MCG tablet Take 1,000 mcg by mouth daily.  ?  ? ?Allergies:   Bee venom, Other, and Tramadol  ? ?Social History  ? ?Tobacco Use  ? Smoking status: Former  ?  Packs/day: 0.50  ?  Years: 38.00  ?  Pack years: 19.00  ?  Types: Cigarettes  ?  Quit date: 07/13/1997  ?  Years since quitting:  24.1  ? Smokeless tobacco: Never  ?Vaping Use  ? Vaping Use: Never used  ?Substance Use Topics  ? Alcohol use: No  ? Drug use: No  ?  ? ?Family Hx: ?The patient's family history includes AAA (abdominal aortic aneurysm) in his father; Arthritis in his mother; Parkinson's disease in his mother. ? ?ROS:   ?Please see the history of present illness.    ?Review of Systems  ?Constitutional: Negative.   ?HENT: Negative.    ?Respiratory: Negative.    ?Cardiovascular: Negative.   ?Gastrointestinal: Negative.   ?Musculoskeletal: Negative.   ?Neurological:  Positive for seizures.  ?Psychiatric/Behavioral: Negative.    ?All other systems reviewed and are negative.  ? ?Labs/Other Tests and Data Reviewed:   ? ?Recent Labs: ?07/12/2021: BUN 20; Creatinine, Ser 0.89; Hemoglobin 14.8; Platelets 123; Potassium 4.2; Sodium 138 ?08/04/2021: ALT 15  ? ?Recent Lipid Panel ?Lab Results  ?Component Value Date/Time  ? CHOL 119 07/22/2020 08:34 AM  ? TRIG 246.0 (H) 07/22/2020 08:34 AM  ? HDL 31.70 (L) 07/22/2020 08:34 AM  ? CHOLHDL 4 07/22/2020 08:34 AM  ? LDLDIRECT 41.0 07/22/2020 08:34 AM   ? ? ?Wt Readings from Last 3 Encounters:  ?09/13/21 200 lb 6 oz (90.9 kg)  ?08/04/21 199 lb (90.3 kg)  ?07/12/21 191 lb (86.6 kg)  ?  ? ?Exam:   ? ?Vital Signs: Vital signs may also be detailed in the HPI ?BP 1

## 2021-09-13 ENCOUNTER — Other Ambulatory Visit
Admission: RE | Admit: 2021-09-13 | Discharge: 2021-09-13 | Disposition: A | Discharge status: Home / Self Care | Payer: Medicare Other | Source: Ambulatory Visit | Attending: Cardiovascular Disease | Admitting: Cardiovascular Disease

## 2021-09-13 ENCOUNTER — Ambulatory Visit (INDEPENDENT_AMBULATORY_CARE_PROVIDER_SITE_OTHER): Payer: Medicare Other

## 2021-09-13 ENCOUNTER — Ambulatory Visit (INDEPENDENT_AMBULATORY_CARE_PROVIDER_SITE_OTHER): Payer: Medicare Other | Admitting: Cardiovascular Disease

## 2021-09-13 VITALS — BP 120/70 | HR 61 | Ht 70.0 in | Wt 200.4 lb

## 2021-09-13 DIAGNOSIS — R079 Chest pain, unspecified: Secondary | ICD-10-CM | POA: Diagnosis not present

## 2021-09-13 DIAGNOSIS — I471 Supraventricular tachycardia: Secondary | ICD-10-CM

## 2021-09-13 DIAGNOSIS — I480 Paroxysmal atrial fibrillation: Secondary | ICD-10-CM

## 2021-09-13 DIAGNOSIS — I700 Atherosclerosis of aorta: Secondary | ICD-10-CM | POA: Diagnosis not present

## 2021-09-13 DIAGNOSIS — E782 Mixed hyperlipidemia: Secondary | ICD-10-CM

## 2021-09-13 DIAGNOSIS — I714 Abdominal aortic aneurysm, without rupture, unspecified: Secondary | ICD-10-CM

## 2021-09-13 DIAGNOSIS — I739 Peripheral vascular disease, unspecified: Secondary | ICD-10-CM | POA: Diagnosis not present

## 2021-09-13 DIAGNOSIS — Z87891 Personal history of nicotine dependence: Secondary | ICD-10-CM | POA: Diagnosis not present

## 2021-09-13 LAB — LIPID PANEL
Cholesterol: 124 mg/dL (ref 0–200)
HDL: 32 mg/dL — ABNORMAL LOW (ref >40)
LDL Cholesterol: 40 mg/dL (ref 0–99)
Total CHOL/HDL Ratio: 3.9 RATIO
Triglycerides: 262 mg/dL — ABNORMAL HIGH (ref <150)
VLDL: 52 mg/dL — ABNORMAL HIGH (ref 0–40)

## 2021-09-13 NOTE — Patient Instructions (Addendum)
Medication Instructions:  ?No changes ? ?If you need a refill on your cardiac medications before your next appointment, please call your pharmacy.  ? ?Lab work: ? ?Today: lipid profile  ? ?Please go to the [facility] to have this drawn ? ?Testing/Procedures: ? ?Your provider has ordered a heart monitor to wear for 14 days. This will be mailed to your home with instructions on placement. Once you have finished the time frame requested, you will return monitor in box provided. ? ?  ? ?Follow-Up: ?At [facility], you and your health needs are our priority.  As part of our continuing mission to provide you with exceptional heart care, we have created designated Provider Care Teams.  These Care Teams include your primary Cardiologist (physician) and Advanced Practice Providers (APPs -  Physician Assistants and Nurse Practitioners) who all work together to provide you with the care you need, when you need it. ? ?You will need a follow up appointment in 12 months ? ?Providers on your designated Care Team:   ?Murray Hodgkins, NP ?Christell Faith, PA-C ?Cadence Kathlen Mody, PA-C ? ?COVID-19 Vaccine Information can be found at: ShippingScam.co.uk For questions related to vaccine distribution or appointments, please email [email or web address] or call 6237123102.  ? ?

## 2021-09-15 DIAGNOSIS — I480 Paroxysmal atrial fibrillation: Secondary | ICD-10-CM

## 2021-09-20 DIAGNOSIS — U071 COVID-19: Secondary | ICD-10-CM | POA: Diagnosis not present

## 2021-09-21 ENCOUNTER — Other Ambulatory Visit: Payer: Self-pay | Admitting: Cardiovascular Disease

## 2021-09-21 NOTE — Telephone Encounter (Signed)
Please review

## 2021-09-21 NOTE — Telephone Encounter (Signed)
Prescription refill request for Eliquis received. ?Indication: PAF ?Last office visit: 09/13/21  Johnny Bridge MD ?Scr: 0.89 on 07/12/21 ?Age: 81 ?Weight: 90.9kg ? ?Based on above findings Eliquis '5mg'$  twice daily is the appropriate dose.  Refill approved. ? ?

## 2021-09-25 DIAGNOSIS — H353132 Nonexudative age-related macular degeneration, bilateral, intermediate dry stage: Secondary | ICD-10-CM | POA: Diagnosis not present

## 2021-09-26 DIAGNOSIS — F419 Anxiety disorder, unspecified: Secondary | ICD-10-CM | POA: Diagnosis not present

## 2021-09-26 DIAGNOSIS — R4689 Other symptoms and signs involving appearance and behavior: Secondary | ICD-10-CM | POA: Diagnosis not present

## 2021-09-26 DIAGNOSIS — R4189 Other symptoms and signs involving cognitive functions and awareness: Secondary | ICD-10-CM | POA: Diagnosis not present

## 2021-09-26 DIAGNOSIS — I480 Paroxysmal atrial fibrillation: Secondary | ICD-10-CM | POA: Diagnosis not present

## 2021-09-26 DIAGNOSIS — I671 Cerebral aneurysm, nonruptured: Secondary | ICD-10-CM | POA: Diagnosis not present

## 2021-09-26 DIAGNOSIS — G912 (Idiopathic) normal pressure hydrocephalus: Secondary | ICD-10-CM | POA: Diagnosis not present

## 2021-09-26 DIAGNOSIS — R200 Anesthesia of skin: Secondary | ICD-10-CM | POA: Diagnosis not present

## 2021-10-02 DIAGNOSIS — I480 Paroxysmal atrial fibrillation: Secondary | ICD-10-CM | POA: Diagnosis not present

## 2021-10-04 DIAGNOSIS — H903 Sensorineural hearing loss, bilateral: Secondary | ICD-10-CM | POA: Diagnosis not present

## 2021-10-04 DIAGNOSIS — R42 Dizziness and giddiness: Secondary | ICD-10-CM | POA: Diagnosis not present

## 2021-10-11 ENCOUNTER — Telehealth: Payer: Self-pay | Admitting: Emergency Medicine

## 2021-10-11 MED ORDER — METOPROLOL SUCCINATE ER 25 MG PO TB24: 37.5000 mg | ORAL_TABLET | Freq: Two times a day (BID) | ORAL | 3 refills | Status: DC

## 2021-10-11 NOTE — Telephone Encounter (Signed)
Called patient. No answer. Left detailed message.   Advised patient to call back or send MyChart message regarding preference on how he would like to take increased metoprolol.

## 2021-10-11 NOTE — Telephone Encounter (Signed)
-----   Message from Minna Merritts, MD sent at 10/09/2021  5:00 PM EDT ----- Event monitor Normal sinus rhythm with atrial fibrillation noted 4% of the time Patient triggered events associated with PVCs 8% burden of PVCs We will try higher dose of metoprolol in effort to suppress A-fib and PVCs Could try total of 3 of his metoprolol succinate 25 mg pills a day, total of 75 mg daily Could try 1 in the morning 2 in the evening or 1.5 pills twice a day

## 2021-10-12 ENCOUNTER — Other Ambulatory Visit: Payer: Self-pay | Admitting: Primary Care

## 2021-10-12 DIAGNOSIS — E782 Mixed hyperlipidemia: Secondary | ICD-10-CM

## 2021-10-13 NOTE — Telephone Encounter (Signed)
Patient is due for his annual visit with me. Please set up a follow up.

## 2021-10-16 NOTE — Telephone Encounter (Signed)
I have set up follow up. We could not get him in until July.

## 2021-10-24 ENCOUNTER — Ambulatory Visit: Payer: TRICARE For Life (TFL) | Admitting: Cardiovascular Disease

## 2021-11-10 ENCOUNTER — Ambulatory Visit: Payer: Medicare Other | Admitting: Primary Care

## 2021-11-17 ENCOUNTER — Ambulatory Visit (INDEPENDENT_AMBULATORY_CARE_PROVIDER_SITE_OTHER): Payer: Medicare Other | Admitting: Primary Care

## 2021-11-17 DIAGNOSIS — K219 Gastro-esophageal reflux disease without esophagitis: Secondary | ICD-10-CM | POA: Diagnosis not present

## 2021-11-17 DIAGNOSIS — R413 Other amnesia: Secondary | ICD-10-CM

## 2021-11-17 DIAGNOSIS — I714 Abdominal aortic aneurysm, without rupture, unspecified: Secondary | ICD-10-CM

## 2021-11-17 DIAGNOSIS — I700 Atherosclerosis of aorta: Secondary | ICD-10-CM | POA: Diagnosis not present

## 2021-11-17 DIAGNOSIS — G912 (Idiopathic) normal pressure hydrocephalus: Secondary | ICD-10-CM | POA: Diagnosis not present

## 2021-11-17 DIAGNOSIS — I480 Paroxysmal atrial fibrillation: Secondary | ICD-10-CM | POA: Diagnosis not present

## 2021-11-17 DIAGNOSIS — E782 Mixed hyperlipidemia: Secondary | ICD-10-CM | POA: Diagnosis not present

## 2021-11-17 MED ORDER — OMEPRAZOLE 20 MG PO CPDR: 20.0000 mg | DELAYED_RELEASE_CAPSULE | Freq: Two times a day (BID) | ORAL | 0 refills | Status: DC

## 2021-11-17 NOTE — Progress Notes (Signed)
Subjective:    Patient ID: Adam Juarez, male    DOB: Sep 22, 1940, 81 y.o.   MRN: [id number]  HPI  Adam Juarez is a very pleasant 81 y.o. male with a history of paroxysmal atrial fibrillation, SVT, AAA, PAD, GERD, normal pressure hydrocephalus, paroxysmal atrial fibrillation, osteoarthritis, prediabetes, memory changes, chronic fatigue, chronic abdominal pain who presents today for follow-up of chronic conditions.  1) Hypertension/PAD/AAA/Hyperlipidemia/Paroxysmal Atrial Fibrillation: Following with cardiology, last office visit was 09/13/2021, Dr. Rockey Situ.  During this visit he was provided a Holter monitor due to questionable seizure-like symptoms and to rule out arrhythmias. Today he reports wearing the holter monitor for 8 days and results showed intermittent bouts of atrial fibrillation. No changes were made to his regimen.  He underwent CT angio abdomen on 07/12/2021 which revealed unchanged abdominal aortic aneurysm.  Repeat scan due in 2024.  Currently managed on apixaban 5 mg twice daily, atorvastatin 20 mg daily, metoprolol succinate 25 mg daily. He denies palpitations, chest pain recently. He does have mild palpitations at times when he's in atrial fibrillation.   2) Cognitive Impairment/Normal Pressure Hydrocephalus: Following with neurology, last office visit was 09/26/2021 with Dr. Manuella Ghazi.  During this visit he was diagnosed with late onset mild dementia.  He was advised to start exercising, participating in hobbies, working on activities to stimulate the brain. Today he endorses a sedentary lifestyle, mostly watches TV during the day, is mildly active around the house. His wife has noticed that he is sleeping less.   During this visit he was concerned about chronic and intermittent symptoms of claustrophobia, feeling tense, feeling panicked.  It was discussed that a potential interaction may be occurring between Eliquis and Lexapro. Today he mentions intermittent symptoms of "zoning out",  increased shortness of breath, feeling faint, feeling claustrophobic. His wife has witnessed these episodes and notices that his head will fall forward and his body will tense up, decreased responsiveness. His last typical episode was this morning at home, lasted for a few minutes.  He did experience mild symptoms in the waiting room today, lasted a few minutes. He will typically experience multiple episodes weekly and then no episodes for 2-3 weeks.   He no longer on Lamictal, took for about 1 week but discontinued as he developed flulike symptoms.  His wife mentions that he is taking 7.5 mg of Lexapro twice daily.  They raise questions today regarding treatment for dementia/Alzheimer's as they have read where there is medication that can slow progression.  They also have several questions regarding new research revealed to diagnose Alzheimer's on CT/MRI.  Following with neurosurgery for normal pressure hydrocephalus, no recent shunt adjustments.  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   3) GERD: Chronic for years. Symptoms include esophageal burning, throat fullness, chest pressure. Symptoms occur after meals. He is taking Tums nearly every day and has been doing so for the last several weeks. He is compliant to his omeprazole 20 mg daily.     Review of Systems  Constitutional:  Positive for fatigue.  Respiratory:  Negative for shortness of breath.   Cardiovascular:  Negative for chest pain and palpitations.  Gastrointestinal:        GERD  Neurological:  Negative for headaches.       See HPI  Psychiatric/Behavioral:  The patient is nervous/anxious.          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 a-fib x 1 episode patient states he "auto corrected" seen at [facility]   GERD (gastroesophageal reflux disease)    Headache    poor posture - none recently    History of kidney stones    Hydrocephalus (HCC)    Hyperlipidemia    Neuropathy    PSVT (paroxysmal supraventricular tachycardia) ([place]) 2009   Controlled with "breathing process"   Renal stone 9/08, 6/15   Wears dentures    full upper    Social History   Socioeconomic History   Marital status: Married    Spouse name: Not on file   Number of children: Not on file   Years of education: Not on file   Highest education level: Not on file  Occupational History   Not on file  Tobacco Use   Smoking status: Former    Packs/day: 0.50    Years: 38.00    Total pack years: 19.00    Types: Cigarettes    Quit date: 07/13/1997    Years since quitting: 24.3   Smokeless tobacco: Never  Vaping Use   Vaping Use: Never used  Substance and Sexual Activity   Alcohol use: No   Drug use: No   Sexual activity: Not on file  Other Topics Concern   Not on file  Social History Narrative   Not on file   Social Determinants of Health   Financial Resource Strain: Low Risk  (05/24/2021)   Overall Financial Resource Strain (CARDIA)    Difficulty of Paying Living Expenses: Not hard at all  Food Insecurity: No Food Insecurity (05/24/2021)   Hunger Vital Sign    Worried About Running Out of Food in the Last Year: Never true    Ran Out of Food in the Last Year: Never true  Transportation Needs: No Transportation Needs (05/24/2021)   PRAPARE - Hydrologist (Medical): No    Lack of Transportation (Non-Medical): No  Physical Activity: Insufficiently Active (05/24/2021)   Exercise Vital Sign    Days of Exercise per Week: 2 days    Minutes of Exercise per Session: 10 min  Stress: No Stress Concern Present (05/24/2021)   [place]    Feeling of Stress : Not at all  Social Connections: [place] (05/24/2021)   Social Connection and Isolation Panel [NHANES]    Frequency of Communication with Friends  and Family: More than three times a week    Frequency of Social Gatherings with Friends and Family: Three times a week    Attends Religious Services: More than 4 times per year    Active Member of Clubs or Organizations: Yes    Attends Archivist Meetings: More than 4 times per year    Marital Status: Married  Human resources officer Violence: Not At Risk (05/24/2021)   Humiliation, Afraid, Rape, and Kick questionnaire    Fear of Current or Ex-Partner: No    Emotionally Abused: No    Physically Abused: No    Sexually Abused: No    Past Surgical History:  Procedure Laterality Date   CARDIAC CATHETERIZATION  2008   [place], [place]   CATARACT EXTRACTION W/PHACO Left 07/27/2015   Procedure: CATARACT EXTRACTION PHACO AND INTRAOCULAR LENS PLACEMENT ([place]);  Surgeon: Leandrew Koyanagi, MD;  Location: [facility];  Service: Ophthalmology;  Laterality: Left;  TORIC  CATARACT EXTRACTION W/PHACO Right 08/24/2015   Procedure: CATARACT EXTRACTION PHACO AND INTRAOCULAR LENS PLACEMENT (I[facility]);  Surgeon: Leandrew Koyanagi, MD;  Location: [facility];  Service: Ophthalmology;  Laterality: Right;  TORIC   COLONOSCOPY WITH PROPOFOL N/A 08/10/2020   Procedure: COLONOSCOPY WITH PROPOFOL;  Surgeon: [place], Benay Pike, MD;  Location: [facility];  Service: Gastroenterology;  Laterality: N/A;   HEMORROIDECTOMY  2010   banding   IR ANGIO INTRA EXTRACRAN SEL COM CAROTID INNOMINATE BILAT MOD SED  10/30/2019   IR ANGIO VERTEBRAL SEL VERTEBRAL UNI R MOD SED  10/30/2019   IR RADIOLOGIST EVAL & MGMT  04/11/2020   IR US GUIDE VASC ACCESS RIGHT  10/30/2019   KIDNEY STONE SURGERY  9/08, 6/15   lithotripsy   LAPAROSCOPIC REVISION VENTRICULAR-PERITONEAL (V-P) SHUNT  01/08/2020   Procedure: LAPAROSCOPIC REVISION VENTRICULAR-PERITONEAL (V-P) SHUNT;  Surgeon: Consuella Lose, MD;  Location: [facility];  Service: Neurosurgery;;   PROSTATE BIOPSY  2007   SKIN CANCER EXCISION     TOENAIL TRIMMING  11/2017    Dr. Elvina Mattes   TONSILLECTOMY     VASECTOMY     VENTRICULOPERITONEAL SHUNT Right 01/08/2020   Procedure: SHUNT INSERTION VENTRICULAR-PERITONEAL;  Surgeon: Consuella Lose, MD;  Location: [facility];  Service: Neurosurgery;  Laterality: Right;    Family History  Problem Relation Age of Onset   AAA (abdominal aortic aneurysm) Father    Parkinson's disease Mother    Arthritis Mother     Allergies  Allergen Reactions   Bee Venom Anaphylaxis and Hives   Other Other (See Comments)    Pistachios - tickles throat  Pistachios - tickles throat   Tramadol Other (See Comments)    Current Outpatient Medications on File Prior to Visit  Medication Sig Dispense Refill   acetaminophen (TYLENOL) 500 MG tablet Take 1,000 mg by mouth every 8 (eight) hours as needed for moderate pain.     ascorbic acid (VITAMIN C) 500 MG tablet Take 500 mg by mouth daily.     atorvastatin (LIPITOR) 20 MG tablet TAKE 1 TABLET DAILY FOR CHOLESTEROL (OFFICE VISIT REQUIRED FOR FURTHER REFILLS) 30 tablet 0   calcium carbonate (TUMS - DOSED IN MG ELEMENTAL CALCIUM) 500 MG chewable tablet Chew 1 tablet by mouth. As needed     diclofenac Sodium (VOLTAREN) 1 % GEL Apply 1 application topically 4 (four) times daily as needed (pain). (Patient not taking: Reported on 07/12/2021)     ELIQUIS 5 MG TABS tablet TAKE 1 TABLET TWICE A DAY 180 tablet 3   EPINEPHRINE 0.3 mg/0.3 mL IJ SOAJ injection INJECT 0.3 ML (0.3 MG TOTAL) INTO THE MUSCLE AS NEEDED FOR ANAPHYLAXIS (Patient not taking: Reported on 08/04/2021) 2 each 1   escitalopram (LEXAPRO) 5 MG tablet Take 5 mg by mouth daily.     hydrocortisone (ANUSOL-HC) 25 MG suppository Place 1 suppository (25 mg total) rectally 2 (two) times daily. 30 suppository 0   Magnesium Cl-Calcium Carbonate (SLOW-MAG PO) Take 1 tablet by mouth daily.      Magnesium Oxide 500 MG TABS Take by mouth.     Melatonin 1 MG CAPS Take 1 mg by mouth at bedtime as needed (sleep).     metoprolol succinate (TOPROL-XL)  25 MG 24 hr tablet Take 1.5 tablets (37.5 mg total) by mouth 2 (two) times daily. 270 tablet 3   Multiple Vitamin (MULTIVITAMIN) tablet Take 1 tablet by mouth daily.     Multiple Vitamins-Minerals (PRESERVISION AREDS 2 PO) Take 1 capsule by mouth 2 (two)  times daily.      Polyethyl Glycol-Propyl Glycol (SYSTANE OP) Apply 1 drop to eye 3 (three) times daily as needed (dry eyes).     Potassium Gluconate 550 MG TABS Take 550 mg by mouth daily.     Probiotic Product (PROBIOTIC DAILY PO) Take 1 capsule by mouth daily.      vitamin B-12 (CYANOCOBALAMIN) 1000 MCG tablet Take 1,000 mcg by mouth daily.     No current facility-administered medications on file prior to visit.    BP 136/80   Pulse (!) 108   Temp 98.6 F (37 C) (Oral)   Ht '5\' 10"'$  (1.778 m)   Wt 199 lb (90.3 kg)   SpO2 99%   BMI 28.55 kg/m  Objective:   Physical Exam Cardiovascular:     Rate and Rhythm: Normal rate and regular rhythm.  Pulmonary:     Effort: Pulmonary effort is normal.     Breath sounds: Normal breath sounds. No wheezing or rales.  Musculoskeletal:     Cervical back: Neck supple.  Skin:    General: Skin is warm and dry.  Neurological:     Mental Status: He is alert and oriented to person, place, and time.     Comments: Answers most questions appropriately.  His wife does correct him a few times during the visit.  Psychiatric:        Mood and Affect: Mood normal.           Assessment & Plan:   Problem List Items Addressed This Visit       Cardiovascular and Mediastinum   Paroxysmal atrial fibrillation (HCC)    Rate and rhythm regular today.  Continue apixaban 5 mg twice daily, metoprolol succinate 25 mg daily.      AAA (abdominal aortic aneurysm) ([place])    Reviewed CT abdominal angio from March 2023. Aneurysm stable and unchanged compared to 2022.  Continue annual monitoring.      Aortic atherosclerosis (HCC)    Continue atorvastatin 20 mg daily.  Lipid panel reviewed from March  2023.  Encouraged daily walking/exercise.        Digestive   Esophageal reflux    Uncontrolled recently.  Increase omeprazole 20 mg twice daily, continue Tums as needed. He will update in a few months.      Relevant Medications   omeprazole (PRILOSEC) 20 MG capsule     Nervous and Auditory   Normal pressure hydrocephalus (HCC)    Following with neurosurgery.  Neurology notes reviewed from care everywhere from May 2023.        Other   Mixed hyperlipidemia    Continue atorvastatin 20 mg daily, lipid panel reviewed from March 2023.      Memory changes    Presumed to be mild dementia according to neurology notes from May 2023.  Reviewed office visit from May 2023 per neurology through care everywhere.  We discussed different medication options for slowing the progression of dementia/Alzheimer's.  They will also discussed with his neurologist.  I strongly advised that he become more active during the day and refrain from watching TV for which she currently does throughout most of his day. We talked about hobbies, puzzles, reading, exercise.          Pleas Koch, NP

## 2021-11-17 NOTE — Assessment & Plan Note (Signed)
Continue atorvastatin 20 mg daily, lipid panel reviewed from March 2023.

## 2021-11-17 NOTE — Assessment & Plan Note (Signed)
Reviewed CT abdominal angio from March 2023. Aneurysm stable and unchanged compared to 2022.  Continue annual monitoring.

## 2021-11-17 NOTE — Assessment & Plan Note (Signed)
Presumed to be mild dementia according to neurology notes from May 2023.  Reviewed office visit from May 2023 per neurology through care everywhere.  We discussed different medication options for slowing the progression of dementia/Alzheimer's.  They will also discussed with his neurologist.  I strongly advised that he become more active during the day and refrain from watching TV for which she currently does throughout most of his day. We talked about hobbies, puzzles, reading, exercise.

## 2021-11-17 NOTE — Assessment & Plan Note (Signed)
Continue atorvastatin 20 mg daily.  Lipid panel reviewed from March 2023.  Encouraged daily walking/exercise.

## 2021-11-17 NOTE — Assessment & Plan Note (Signed)
Rate and rhythm regular today.  Continue apixaban 5 mg twice daily, metoprolol succinate 25 mg daily.

## 2021-11-17 NOTE — Assessment & Plan Note (Signed)
Uncontrolled recently.  Increase omeprazole 20 mg twice daily, continue Tums as needed. He will update in a few months.

## 2021-11-17 NOTE — Patient Instructions (Signed)
We increased the dose of your omeprazole to 20 mg twice daily for heartburn.   We can consider medications such as Aricept or Namenda for memory and slowing progression of dementia.   It was a pleasure to see you today!

## 2021-11-17 NOTE — Assessment & Plan Note (Signed)
Following with neurosurgery.  Neurology notes reviewed from care everywhere from May 2023.

## 2021-11-22 ENCOUNTER — Other Ambulatory Visit: Payer: Self-pay | Admitting: Primary Care

## 2021-11-22 DIAGNOSIS — E782 Mixed hyperlipidemia: Secondary | ICD-10-CM

## 2021-11-30 DIAGNOSIS — I671 Cerebral aneurysm, nonruptured: Secondary | ICD-10-CM | POA: Diagnosis not present

## 2021-11-30 DIAGNOSIS — Z982 Presence of cerebrospinal fluid drainage device: Secondary | ICD-10-CM | POA: Diagnosis not present

## 2021-11-30 DIAGNOSIS — R569 Unspecified convulsions: Secondary | ICD-10-CM | POA: Diagnosis not present

## 2021-11-30 DIAGNOSIS — R200 Anesthesia of skin: Secondary | ICD-10-CM | POA: Diagnosis not present

## 2021-11-30 DIAGNOSIS — R4189 Other symptoms and signs involving cognitive functions and awareness: Secondary | ICD-10-CM | POA: Diagnosis not present

## 2021-11-30 DIAGNOSIS — I480 Paroxysmal atrial fibrillation: Secondary | ICD-10-CM | POA: Diagnosis not present

## 2021-11-30 DIAGNOSIS — G912 (Idiopathic) normal pressure hydrocephalus: Secondary | ICD-10-CM | POA: Diagnosis not present

## 2021-11-30 DIAGNOSIS — F419 Anxiety disorder, unspecified: Secondary | ICD-10-CM | POA: Diagnosis not present

## 2021-12-06 DIAGNOSIS — Z961 Presence of intraocular lens: Secondary | ICD-10-CM | POA: Diagnosis not present

## 2021-12-06 DIAGNOSIS — H26491 Other secondary cataract, right eye: Secondary | ICD-10-CM | POA: Diagnosis not present

## 2021-12-06 DIAGNOSIS — H353132 Nonexudative age-related macular degeneration, bilateral, intermediate dry stage: Secondary | ICD-10-CM | POA: Diagnosis not present

## 2021-12-10 ENCOUNTER — Encounter: Payer: Self-pay | Admitting: Cardiovascular Disease

## 2021-12-11 DIAGNOSIS — M79674 Pain in right toe(s): Secondary | ICD-10-CM | POA: Diagnosis not present

## 2021-12-11 DIAGNOSIS — B351 Tinea unguium: Secondary | ICD-10-CM | POA: Diagnosis not present

## 2021-12-11 DIAGNOSIS — M216X9 Other acquired deformities of unspecified foot: Secondary | ICD-10-CM | POA: Diagnosis not present

## 2021-12-11 DIAGNOSIS — M216X2 Other acquired deformities of left foot: Secondary | ICD-10-CM | POA: Diagnosis not present

## 2021-12-11 DIAGNOSIS — L909 Atrophic disorder of skin, unspecified: Secondary | ICD-10-CM | POA: Diagnosis not present

## 2021-12-11 DIAGNOSIS — M79675 Pain in left toe(s): Secondary | ICD-10-CM | POA: Diagnosis not present

## 2021-12-11 DIAGNOSIS — M2041 Other hammer toe(s) (acquired), right foot: Secondary | ICD-10-CM | POA: Diagnosis not present

## 2021-12-11 DIAGNOSIS — L851 Acquired keratosis [keratoderma] palmaris et plantaris: Secondary | ICD-10-CM | POA: Diagnosis not present

## 2021-12-11 DIAGNOSIS — M792 Neuralgia and neuritis, unspecified: Secondary | ICD-10-CM | POA: Diagnosis not present

## 2021-12-11 DIAGNOSIS — M216X1 Other acquired deformities of right foot: Secondary | ICD-10-CM | POA: Diagnosis not present

## 2021-12-11 DIAGNOSIS — L84 Corns and callosities: Secondary | ICD-10-CM | POA: Diagnosis not present

## 2021-12-11 DIAGNOSIS — M545 Low back pain, unspecified: Secondary | ICD-10-CM | POA: Diagnosis not present

## 2021-12-18 MED ORDER — METOPROLOL SUCCINATE ER 50 MG PO TB24
50.0000 mg | ORAL_TABLET | Freq: Two times a day (BID) | ORAL | 3 refills | Status: DC
Start: 2021-12-18 — End: 2023-05-29

## 2022-01-01 ENCOUNTER — Ambulatory Visit: Payer: Medicare Other | Attending: Neurology | Admitting: Speech Pathology

## 2022-01-01 DIAGNOSIS — R41841 Cognitive communication deficit: Secondary | ICD-10-CM | POA: Diagnosis not present

## 2022-01-01 DIAGNOSIS — R4189 Other symptoms and signs involving cognitive functions and awareness: Secondary | ICD-10-CM | POA: Diagnosis not present

## 2022-01-03 ENCOUNTER — Ambulatory Visit: Payer: Medicare Other | Admitting: Speech Pathology

## 2022-01-03 DIAGNOSIS — R41841 Cognitive communication deficit: Secondary | ICD-10-CM | POA: Diagnosis not present

## 2022-01-03 DIAGNOSIS — R4189 Other symptoms and signs involving cognitive functions and awareness: Secondary | ICD-10-CM | POA: Diagnosis not present

## 2022-01-03 NOTE — Therapy (Unsigned)
OUTPATIENT SPEECH LANGUAGE PATHOLOGY TREATMENT NOTE   Patient Name: Adam Juarez MRN: [id number] DOB:07/17/1940, 81 y.o., male Today's Date: 01/03/2022  PCP: Alma Friendly, NP REFERRING PROVIDER: Jennings Books, MD  END OF SESSION:   End of Session - 01/03/22 1623     Visit Number 2    Number of Visits 25    Date for SLP Re-Evaluation 03/26/22    Authorization Type Medicare A/Medicare B/Tricare for Life    Progress Note Due on Visit 10    SLP Start Time 1300    SLP Stop Time  1408    SLP Time Calculation (min) 68 min    Activity Tolerance Patient tolerated treatment well             Past Medical History:  Diagnosis Date   Arthritis    fingers   Cancer ([place]) 1991, 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 a-fib x 1 episode patient states he "auto corrected" seen at [facility]   GERD (gastroesophageal reflux disease)    Headache    poor posture - none recently   History of kidney stones    Hydrocephalus (HCC)    Hyperlipidemia    Neuropathy    PSVT (paroxysmal supraventricular tachycardia) ([place]) 2009   Controlled with "breathing process"   Renal stone 9/08, 6/15   Wears dentures    full upper   Past Surgical History:  Procedure Laterality Date   CARDIAC CATHETERIZATION  2008   [place], [place]   CATARACT EXTRACTION [facility] Left 07/27/2015   Procedure: CATARACT EXTRACTION PHACO AND INTRAOCULAR LENS PLACEMENT ([place]);  Surgeon: Leandrew Koyanagi, MD;  Location: [place];  Service: Ophthalmology;  Laterality: Left;  TORIC   CATARACT EXTRACTION W/PHACO Right 08/24/2015   Procedure: CATARACT EXTRACTION PHACO AND INTRAOCULAR LENS PLACEMENT (IOC);  Surgeon: Leandrew Koyanagi, MD;  Location: [place];  Service: Ophthalmology;  Laterality: Right;  TORIC   COLONOSCOPY WITH PROPOFOL N/A 08/10/2020   Procedure: COLONOSCOPY WITH PROPOFOL;  Surgeon: Toledo, Benay Pike, MD;  Location: [facility] ENDOSCOPY;   Service: Gastroenterology;  Laterality: N/A;   HEMORROIDECTOMY  2010   banding   IR ANGIO INTRA EXTRACRAN SEL COM CAROTID INNOMINATE BILAT MOD SED  10/30/2019   IR ANGIO VERTEBRAL SEL VERTEBRAL UNI R MOD SED  10/30/2019   IR RADIOLOGIST EVAL & MGMT  04/11/2020   IR US GUIDE VASC ACCESS RIGHT  10/30/2019   KIDNEY STONE SURGERY  9/08, 6/15   lithotripsy   LAPAROSCOPIC REVISION VENTRICULAR-PERITONEAL (V-P) SHUNT  01/08/2020   Procedure: LAPAROSCOPIC REVISION VENTRICULAR-PERITONEAL (V-P) SHUNT;  Surgeon: Consuella Lose, MD;  Location: [place];  Service: Neurosurgery;;   PROSTATE BIOPSY  2007   SKIN CANCER EXCISION     TOENAIL TRIMMING  11/2017   Dr. 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   Excessive daytime sleepiness 10/19/2019   Hip pain 07/15/2019   Medicare annual wellness visit, subsequent 02/06/2019   Memory changes 11/21/2018   Aortic atherosclerosis ([place]) 03/27/2018  PAD (peripheral artery disease) ([place]) 03/27/2018   Smoking history 03/27/2018   Chronic left shoulder pain 03/13/2018   History of systemic reaction to bee sting 01/28/2018   Prediabetes 01/28/2018   History of elevated PSA 01/28/2018   Fall with injury 05/22/2017   AAA (abdominal aortic aneurysm) ([place]) 05/22/2017   Frequent headaches 01/15/2017   Osteoarthritis 07/13/2016   Chronic foot pain 07/13/2016   Encounter for abdominal aortic aneurysm (AAA) screening 02/17/2016   Mixed hyperlipidemia 11/27/2015   Chronic back pain 11/27/2015   Chronic fatigue  11/27/2015   SVT (supraventricular tachycardia) ([place]) 09/16/2015   Paroxysmal atrial fibrillation ([place]) 09/16/2015   Frequent PVCs 09/16/2015   Esophageal reflux 09/16/2015   Preventative health care 09/16/2015    ONSET DATE: date of referral 12/12/2021  REFERRING DIAG: R41.89 (ICD-10-CM) - Cognitive impairment   PERTINENT HISTORY: Normal Pressure Hydrocephalus in a patient with cognitive impairment + minimal gait impairment + bladder issues + panic attacks 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 No typical spell was recorded during this study. No ictal or interictal epileptiform activity was noted.    Routine EEG 02/2021: This is an awake and sleep abnormal EEG due to presence of left temporal theta slowing was noted. No epileptiform discharges noted.   Dec 15, 2021 10:21 AM - audiology Right Score: 100% at 60dB HL Normal Symmetric No prior test for comparison Left Score: 100% at 60dB HL Normal Symmetric No prior test for comparison   Patient is not a hearing aid candidate at this time due to normal hearing from [phone].    THERAPY DIAG:  Cognitive communication deficit  Cognitive impairment  Rationale for Evaluation and Treatment Rehabilitation  SUBJECTIVE: pt reports struggling with dementia  diagnosis  Pt accompanied by: significant other  PAIN:  Are you having pain? No  PATIENT GOALS: Pt states goal of improving memory of names, specifically names of  family members. His wife states goal of helping pt have more interest in life, interacting more with activities.   OBJECTIVE:   TODAY'S TREATMENT: Skilled treatment session focused on pt's cognitive communication goals. Specifically, providing written information regarding cognitive domains, dementia diagnosis, terminology such as late-onset and answering pt and wife's questions to decrease pt's agnosia regarding new dx. Pt with specific questions about depression and potential for PTSD related to [employer]. As such the PHQ-9 was administered.   The PHQ-9 can function as a screening tool, an aid in diagnosis, and as a symptom tracking tool that can help track a patient's overall depression severity as well as track the improvement of specific symptoms with treatment.   See responses below:  In the last 2 weeks, pt reports the following frequency:  1.) Little interest or pleasure in doing things: NEARLY EVERY DAY (3) 2.) Feeling down, depressed or hopeless: SEVERAL DAYS (1) 3.) Trouble falling or staying asleep, or sleeping too much: NEARLY EVERY DAY (3)  4.) Feeling tired or having little energy: NEARLY EVERY DAY (3) 5.) Poor appetite or overeating: SEVERAL DAYS (1) 6.) Feeling bad about yourself- or that you are a failure or have let yourself or your family down: NOT AT ALL (0) 7.) Trouble concentrating on things, such as reading the newspaper or watching television: NOT AT ALL (0) 8.) Moving or speaking so slowly that other people could have noticed. Or the opposite - being so fidgety or restless that you have been moving around a lot more than usual: NOT AT ALL (0) 9.) thoughts that you would be better off dead, or of hurting yourself: NOT AT ALL (0) 10.) If you have checked off any problems, how difficult have these  problems made it for you to do your work, take care of things at home, or get along with other people?: VERY DIFFICULT TOTAL SCORE: 11  DEPRESSION SEVERITY: MODERATE DEPRESSION    PATIENT EDUCATION: Education details: dementia Person educated: Patient and Spouse Education method: Explanation, Verbal cues, and Handouts Education comprehension: verbalized understanding and needs further education  GOALS: Goals reviewed with patient? Yes   SHORT TERM GOALS: Target date: 10 sessions   With moderate assistance, pt will use strategies to improve memory for important information with 75% acc. with minimal assistance (ie., white board, daily planner/calendar, Apps on phone).  Baseline: 25% Goal status: INITIAL   2.  Given a functional problem (ie., social problem, multi-step math word problem, logic puzzle) or scenario from daily life, patient will demonstrate error awareness and correct errors independently with at least 75% acc with moderate assistance.  Baseline: unaware Goal status: INITIAL     LONG TERM GOALS: Target date: 03/26/2022 Given a functional problem a scenario from daily life, patient will demonstrate error awareness and correct errors independently with at least 90% accuracy with minimal assistance.  Baseline: unaware Goal status: INITIAL   2.  Given minimal assistance, pt will use strategies to improve memory for important information with 90% acc. (ie., white board, daily planner/calendar, Apps on phone).  Baseline: 25% Goal status: INITIAL  ASSESSMENT:  CLINICAL IMPRESSION: Pt and his wife are eager to learn about his cognitive communication deficits. This is evidenced by their questions.   OBJECTIVE IMPAIRMENTS include attention, memory, awareness, and executive functioning. These impairments are limiting patient from managing medications, managing appointments, household responsibilities, and ADLs/IADLs. Factors affecting potential to achieve goals and  functional outcome are ability to learn/carryover information, co-morbidities, medical prognosis, and severity of impairments. Patient will benefit from skilled SLP services to address above impairments and improve overall function.  REHAB POTENTIAL: Good  PLAN: SLP FREQUENCY: 1-2x/week  SLP DURATION: 12 weeks  PLANNED INTERVENTIONS: Cognitive reorganization, Internal/external aids, Functional tasks, SLP instruction and feedback, Compensatory strategies, and Patient/family education   Britton Bera B. Rutherford Nail, M.S., CCC-SLP, Mining engineer Certified Brain Injury [place]  [place] Office [phone] Ascom [phone] Fax [phone]

## 2022-01-03 NOTE — Therapy (Signed)
OUTPATIENT SPEECH LANGUAGE PATHOLOGY  COGNITION EVALUATION   Patient Name: Adam Juarez MRN: [id number] DOB:[birth year], 81 y.o., male Today's Date: 01/03/2022  PCP: Alma Friendly, NP REFERRING PROVIDER: Jennings Books, MD   End of Session - 01/03/22 0830     Visit Number 1    Number of Visits 25    Date for SLP Re-Evaluation 03/26/22    Authorization Type Medicare A/Medicare B/Tricare for Life    Progress Note Due on Visit 10    SLP Start Time 1300    SLP Stop Time  1400    SLP Time Calculation (min) 60 min    Activity Tolerance Patient tolerated treatment well             Past Medical History:  Diagnosis Date   Arthritis    fingers   Cancer ([place]) 1991, 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 a-fib x 1 episode patient states he "auto corrected" seen at [facility]   GERD (gastroesophageal reflux disease)    Headache    poor posture - none recently   History of kidney stones    Hydrocephalus (HCC)    Hyperlipidemia    Neuropathy    PSVT (paroxysmal supraventricular tachycardia) ([place]) 2009   Controlled with "breathing process"   Renal stone 9/08, 6/15   Wears dentures    full upper   Past Surgical History:  Procedure Laterality Date   CARDIAC CATHETERIZATION  2008   [place], [place]   CATARACT EXTRACTION [facility] Left 07/27/2015   Procedure: CATARACT EXTRACTION PHACO AND INTRAOCULAR LENS PLACEMENT ([place]);  Surgeon: Leandrew Koyanagi, MD;  Location: [place];  Service: Ophthalmology;  Laterality: Left;  TORIC   CATARACT EXTRACTION W/PHACO Right 08/24/2015   Procedure: CATARACT EXTRACTION PHACO AND INTRAOCULAR LENS PLACEMENT (IOC);  Surgeon: Leandrew Koyanagi, MD;  Location: [place];  Service: Ophthalmology;  Laterality: Right;  TORIC   COLONOSCOPY WITH PROPOFOL N/A 08/10/2020   Procedure: COLONOSCOPY WITH PROPOFOL;  Surgeon: Toledo, Benay Pike, MD;  Location: [facility] ENDOSCOPY;  Service:  Gastroenterology;  Laterality: N/A;   HEMORROIDECTOMY  2010   banding   IR ANGIO INTRA EXTRACRAN SEL COM CAROTID INNOMINATE BILAT MOD SED  10/30/2019   IR ANGIO VERTEBRAL SEL VERTEBRAL UNI R MOD SED  10/30/2019   IR RADIOLOGIST EVAL & MGMT  04/11/2020   IR US GUIDE VASC ACCESS RIGHT  10/30/2019   KIDNEY STONE SURGERY  9/08, 6/15   lithotripsy   LAPAROSCOPIC REVISION VENTRICULAR-PERITONEAL (V-P) SHUNT  01/08/2020   Procedure: LAPAROSCOPIC REVISION VENTRICULAR-PERITONEAL (V-P) SHUNT;  Surgeon: Consuella Lose, MD;  Location: [place];  Service: Neurosurgery;;   PROSTATE BIOPSY  2007   SKIN CANCER EXCISION     TOENAIL TRIMMING  11/2017   Dr. 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   Excessive daytime sleepiness 10/19/2019   Hip pain 07/15/2019   Medicare annual wellness visit, subsequent 02/06/2019   Memory changes 11/21/2018   Aortic atherosclerosis ([place]) 03/27/2018   PAD (peripheral  artery disease) ([place]) 03/27/2018   Smoking history 03/27/2018   Chronic left shoulder pain 03/13/2018   History of systemic reaction to bee sting 01/28/2018   Prediabetes 01/28/2018   History of elevated PSA 01/28/2018   Fall with injury 05/22/2017   AAA (abdominal aortic aneurysm) ([place]) 05/22/2017   Frequent headaches 01/15/2017   Osteoarthritis 07/13/2016   Chronic foot pain 07/13/2016   Encounter for abdominal aortic aneurysm (AAA) screening 02/17/2016   Mixed hyperlipidemia 11/27/2015   Chronic back pain 11/27/2015   Chronic fatigue 11/27/2015    SVT (supraventricular tachycardia) ([place]) 09/16/2015   Paroxysmal atrial fibrillation ([place]) 09/16/2015   Frequent PVCs 09/16/2015   Esophageal reflux 09/16/2015   Preventative health care 09/16/2015    ONSET DATE: date of referral 12/12/2021   REFERRING DIAG: R41.89 (ICD-10-CM) - Cognitive impairment  THERAPY DIAG:  Cognitive communication deficit  Cognitive impairment  Rationale for Evaluation and Treatment Rehabilitation  SUBJECTIVE:   SUBJECTIVE STATEMENT: Pt pleasant, accompanied by his wife Pt accompanied by: significant other  PERTINENT HISTORY: Normal Pressure Hydrocephalus in a patient with cognitive impairment + minimal gait impairment + bladder issues + panic attacks 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 No typical spell was recorded during this study. No ictal or interictal epileptiform activity was noted.   Routine EEG 02/2021: This is an awake and sleep abnormal EEG due to presence of left temporal theta slowing was noted. No epileptiform discharges noted.  Dec 15, 2021 10:21 AM - audiology Right Score: 100% at 60dB HL Normal Symmetric No prior test for comparison Left Score: 100% at 60dB HL Normal Symmetric No prior test for comparison  Patient is not a hearing aid candidate at this time due to  normal hearing from [phone].   PAIN:  Are you having pain? No   FALLS: Has patient fallen in last 6 months?  No  LIVING ENVIRONMENT: Lives with: lives with their spouse Lives in: House/apartment  PLOF:  Level of assistance: Needed assistance with ADLs, Needed assistance with IADLS Employment: Retired   PATIENT GOALS: Pt states goal of improving memory of names, specifically names of  family members. His wife states goal of helping pt have more interest in life, interacting more with activities.   OBJECTIVE:   COGNITIVE COMMUNICATION Overall cognitive status: Impaired Areas of impairment:  Attention: Impaired: Sustained, Selective Memory: Impaired: Immediate Short term Auditory Visual Awareness: Impaired: Emergent and Anticipatory Executive function: Impaired: Problem solving, Organization, Planning, Error awareness, Self-correction, and Slow processing Behavior: agnosia  Auditory comprehension: WFL Verbal expression: WFL Functional communication: WFL Functional deficits: pt's wife manages medication, finances, pt states that he sleeps a lot and primarily watches TV   ORAL MOTOR EXAMINATION Facial : WFL Lingual: WFL Velum: WFL Mandible: WFL Cough: WFL Voice: WFL   STANDARDIZED ASSESSMENTS: TRIAL MAKING TEST (TMT) Parts A & B Both parts of the Trail Making Test consist of 25 circles distributed over a sheet  of paper. In Part A, the circles are numbered 1 - 25, and the patient should draw lines to connect the numbers in ascending order. In Part B, the circles include both numbers (1 - 13) and letters (A - L); as in Part A, the patient draws lines to connect the circles in an ascending pattern, but with the added task of alternating between the numbers and letters (i.e., 1-A-2-B-3-C, etc.).   Results for both TMT A and B are reported as the number of seconds required to complete the task; therefore, higher scores reveal greater impairment.  Results:  Trail A -  WNL - 64 seconds (n=29 seconds; Deficient > 78 seconds) Trail B - WNL - 240 seconds (n= 75 seconds; Deficient > 273 seconds)   and  Addenbrooke's Cognitive Examination - ACE III The Addenbrooke's Cognitive Examination-III (ACE-III) is a brief cognitive test that assesses five cognitive domains. The total score is 100 with higher scores indicating better cognitive functioning. Cut off scores of 88 and 82 are recommended for suspicion of dementia (88 has sensitivity of 1.00 and specificity of 0.96, 82 has sensitivity of 0.93 and specificity of 1.00). American Version A  Attention 17/18  Memory 14/26  Fluency 8/14  Language 25/26  Visuospatial 14/16  TOTAL ACE- III Score 78/100     PATIENT REPORTED OUTCOME MEASURES (PROM):  The Neuro-QOLT Item Bank v2.0-Cognition Function-Short Form is an eight-item test designed to measure difficulties with cognitive functioning (e.g., memory, attention and decision making or in the application of such abilities to everyday tasks (e.g., planning, organizing, calculating, remembering and learning).  Patient reported: T-SCORE: 38.9; 1 SD below mean of 50 Wife reported: T-SCORE: 35; 1.5 SD below mean of 50    PATIENT EDUCATION: Education details: results of this assessment, ST POC Person educated: Patient and Spouse Education method: Explanation Education comprehension: needs further education     GOALS: Goals reviewed with patient? Yes  SHORT TERM GOALS: Target date: 10 sessions  With moderate assistance, pt will use strategies to improve memory for important information with 75% acc. with minimal assistance (ie., white board, daily planner/calendar, Apps on phone).  Baseline: 25% Goal status: INITIAL  2.  Given a functional problem (ie., social problem, multi-step math word problem, logic puzzle) or scenario from daily life, patient will demonstrate error awareness and correct errors independently with at least 75% acc with moderate  assistance.  Baseline: unaware Goal status: INITIAL   LONG TERM GOALS: Target date: 03/26/2022 Given a functional problem a scenario from daily life, patient will demonstrate error awareness and correct errors independently with at least 90% accuracy with minimal assistance.  Baseline: unaware Goal status: INITIAL  2.  Given minimal assistance, pt will use strategies to improve memory for important information with 90% acc. (ie., white board, daily planner/calendar, Apps on phone).  Baseline: 25% Goal status: INITIAL   ASSESSMENT:  CLINICAL IMPRESSION: Patient is a 81 y.o. 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 Pt presents with mild to moderative cognitive impairment c/b primary moderate deficits in selective attention as well as memory. 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    OBJECTIVE IMPAIRMENTS include attention, memory, awareness, and executive functioning. These impairments are limiting patient from managing medications, managing appointments,  household responsibilities, and ADLs/IADLs. Factors affecting potential to achieve goals and functional outcome are ability to learn/carryover information, co-morbidities, medical prognosis, and severity of impairments.. Patient will benefit from skilled SLP services to address above impairments and improve overall function.  REHAB POTENTIAL: Good  PLAN: SLP FREQUENCY: 1-2x/week  SLP DURATION: 12 weeks  PLANNED INTERVENTIONS: Cognitive reorganization, Internal/external aids, Functional tasks, SLP instruction and feedback, Compensatory strategies, and Patient/family education  Teka Chanda B. Rutherford Nail, M.S., CCC-SLP,  Mining engineer Certified Brain Injury [place]  [place] Office [phone] Ascom [phone] Fax [phone]

## 2022-01-04 DIAGNOSIS — D485 Neoplasm of uncertain behavior of skin: Secondary | ICD-10-CM | POA: Diagnosis not present

## 2022-01-04 DIAGNOSIS — D0439 Carcinoma in situ of skin of other parts of face: Secondary | ICD-10-CM | POA: Diagnosis not present

## 2022-01-04 DIAGNOSIS — L820 Inflamed seborrheic keratosis: Secondary | ICD-10-CM | POA: Diagnosis not present

## 2022-01-04 DIAGNOSIS — D0461 Carcinoma in situ of skin of right upper limb, including shoulder: Secondary | ICD-10-CM | POA: Diagnosis not present

## 2022-01-08 ENCOUNTER — Ambulatory Visit: Payer: Medicare Other | Admitting: Speech Pathology

## 2022-01-08 DIAGNOSIS — R4189 Other symptoms and signs involving cognitive functions and awareness: Secondary | ICD-10-CM

## 2022-01-08 DIAGNOSIS — R41841 Cognitive communication deficit: Secondary | ICD-10-CM | POA: Diagnosis not present

## 2022-01-08 DIAGNOSIS — H353132 Nonexudative age-related macular degeneration, bilateral, intermediate dry stage: Secondary | ICD-10-CM | POA: Diagnosis not present

## 2022-01-10 ENCOUNTER — Ambulatory Visit: Payer: Medicare Other | Admitting: Speech Pathology

## 2022-01-10 DIAGNOSIS — R41841 Cognitive communication deficit: Secondary | ICD-10-CM

## 2022-01-10 DIAGNOSIS — R4189 Other symptoms and signs involving cognitive functions and awareness: Secondary | ICD-10-CM

## 2022-01-10 NOTE — Therapy (Signed)
OUTPATIENT SPEECH LANGUAGE PATHOLOGY TREATMENT NOTE   Patient Name: Adam Juarez MRN: [id number] DOB:02-04-1941, 81 y.o., male Today's Date: 01/10/2022  PCP: Alma Friendly, NP REFERRING PROVIDER: Jennings Books, MD  END OF SESSION:   End of Session - 01/10/22 1252     Visit Number 3    Number of Visits 25    Date for SLP Re-Evaluation 03/26/22    Authorization Type Medicare A/Medicare B/Tricare for Life    Progress Note Due on Visit 10    SLP Start Time 1300    SLP Stop Time  1400    SLP Time Calculation (min) 60 min    Activity Tolerance Patient tolerated treatment well             Past Medical History:  Diagnosis Date   Arthritis    fingers   Cancer ([place]) 1991, 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 a-fib x 1 episode patient states he "auto corrected" seen at [facility]   GERD (gastroesophageal reflux disease)    Headache    poor posture - none recently   History of kidney stones    Hydrocephalus (HCC)    Hyperlipidemia    Neuropathy    PSVT (paroxysmal supraventricular tachycardia) ([place]) 2009   Controlled with "breathing process"   Renal stone 9/08, 6/15   Wears dentures    full upper   Past Surgical History:  Procedure Laterality Date   CARDIAC CATHETERIZATION  2008   [place], [place]   CATARACT EXTRACTION [facility] Left 07/27/2015   Procedure: CATARACT EXTRACTION PHACO AND INTRAOCULAR LENS PLACEMENT ([place]);  Surgeon: Leandrew Koyanagi, MD;  Location: [place];  Service: Ophthalmology;  Laterality: Left;  TORIC   CATARACT EXTRACTION W/PHACO Right 08/24/2015   Procedure: CATARACT EXTRACTION PHACO AND INTRAOCULAR LENS PLACEMENT (IOC);  Surgeon: Leandrew Koyanagi, MD;  Location: [place];  Service: Ophthalmology;  Laterality: Right;  TORIC   COLONOSCOPY WITH PROPOFOL N/A 08/10/2020   Procedure: COLONOSCOPY WITH PROPOFOL;  Surgeon: Toledo, Benay Pike, MD;  Location: [facility] ENDOSCOPY;   Service: Gastroenterology;  Laterality: N/A;   HEMORROIDECTOMY  2010   banding   IR ANGIO INTRA EXTRACRAN SEL COM CAROTID INNOMINATE BILAT MOD SED  10/30/2019   IR ANGIO VERTEBRAL SEL VERTEBRAL UNI R MOD SED  10/30/2019   IR RADIOLOGIST EVAL & MGMT  04/11/2020   IR US GUIDE VASC ACCESS RIGHT  10/30/2019   KIDNEY STONE SURGERY  9/08, 6/15   lithotripsy   LAPAROSCOPIC REVISION VENTRICULAR-PERITONEAL (V-P) SHUNT  01/08/2020   Procedure: LAPAROSCOPIC REVISION VENTRICULAR-PERITONEAL (V-P) SHUNT;  Surgeon: Consuella Lose, MD;  Location: [place];  Service: Neurosurgery;;   PROSTATE BIOPSY  2007   SKIN CANCER EXCISION     TOENAIL TRIMMING  11/2017   Dr. 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   Excessive daytime sleepiness 10/19/2019   Hip pain 07/15/2019   Medicare annual wellness visit, subsequent 02/06/2019   Memory changes 11/21/2018   Aortic atherosclerosis ([place]) 03/27/2018  PAD (peripheral artery disease) ([place]) 03/27/2018   Smoking history 03/27/2018   Chronic left shoulder pain 03/13/2018   History of systemic reaction to bee sting 01/28/2018   Prediabetes 01/28/2018   History of elevated PSA 01/28/2018   Fall with injury 05/22/2017   AAA (abdominal aortic aneurysm) ([place]) 05/22/2017   Frequent headaches 01/15/2017   Osteoarthritis 07/13/2016   Chronic foot pain 07/13/2016   Encounter for abdominal aortic aneurysm (AAA) screening 02/17/2016   Mixed hyperlipidemia 11/27/2015   Chronic back pain 11/27/2015   Chronic fatigue  11/27/2015   SVT (supraventricular tachycardia) ([place]) 09/16/2015   Paroxysmal atrial fibrillation ([place]) 09/16/2015   Frequent PVCs 09/16/2015   Esophageal reflux 09/16/2015   Preventative health care 09/16/2015    ONSET DATE: date of referral 12/12/2021  REFERRING DIAG: R41.89 (ICD-10-CM) - Cognitive impairment   PERTINENT HISTORY: Normal Pressure Hydrocephalus in a patient with cognitive impairment + minimal gait impairment + bladder issues + panic attacks 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 No typical spell was recorded during this study. No ictal or interictal epileptiform activity was noted.    Routine EEG 02/2021: This is an awake and sleep abnormal EEG due to presence of left temporal theta slowing was noted. No epileptiform discharges noted.   Dec 15, 2021 10:21 AM - audiology Right Score: 100% at 60dB HL Normal Symmetric No prior test for comparison Left Score: 100% at 60dB HL Normal Symmetric No prior test for comparison   Patient is not a hearing aid candidate at this time due to normal hearing from [phone] Hz.    THERAPY DIAG:  Cognitive communication deficit  Cognitive impairment  Rationale for Evaluation and Treatment Rehabilitation  SUBJECTIVE: pt will be 81 tomorrow  Pt accompanied by:  significant other  PAIN:  Are you having pain? No  PATIENT GOALS: Pt states goal of improving memory of names, specifically names of  family members. His wife states goal of helping pt have more interest in life, interacting more with activities.   OBJECTIVE:   TODAY'S TREATMENT: Skilled treatment session focused on pt's cognition goals. Pt's wife reported feeling somewhat frizzled as pt made several wrong turns when coming to the hospital. 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 Despite multimodal assistance, pt was not able to increase insight into potential hazards of driving with cognitive impairments.      PATIENT EDUCATION: Education details: dementia Person educated: Patient and Spouse Education method: Explanation, Verbal cues, and Handouts Education comprehension: verbalized understanding and needs further education  GOALS: Goals reviewed with patient? Yes   SHORT TERM GOALS: Target date: 10 sessions   With moderate assistance, pt will use strategies to improve memory for important information with 75% acc. with minimal assistance (ie., white board, daily planner/calendar, Apps on phone).  Baseline: 25% Goal status: INITIAL  2.  Given a functional problem (ie., social problem, multi-step math word problem, logic puzzle) or scenario from daily life, patient will demonstrate error awareness and correct errors independently with at least 75% acc with moderate assistance.  Baseline: unaware Goal status: INITIAL     LONG TERM GOALS: Target date: 03/26/2022 Given a functional problem a scenario from daily life, patient will demonstrate error awareness and correct errors independently with at least 90% accuracy with minimal assistance.  Baseline: unaware Goal status: INITIAL   2.  Given minimal assistance, pt will use strategies to improve memory for important information  with 90% acc. (ie., white board, daily planner/calendar, Apps on phone).  Baseline: 25% Goal status: INITIAL  ASSESSMENT:  CLINICAL IMPRESSION: Pt presents with agnosia as evidenced by fact that his errors while driving.   OBJECTIVE IMPAIRMENTS include attention, memory, awareness, and executive functioning. These impairments are limiting patient from managing medications, managing appointments, household responsibilities, and ADLs/IADLs. Factors affecting potential to achieve goals and functional outcome are ability to learn/carryover information, co-morbidities, medical prognosis, and severity of impairments. Patient will benefit from skilled SLP services to address above impairments and improve overall function.  REHAB POTENTIAL: Good  PLAN: SLP FREQUENCY: 1-2x/week  SLP DURATION: 12 weeks  PLANNED INTERVENTIONS: Cognitive reorganization, Internal/external aids, Functional tasks, SLP instruction and feedback, Compensatory strategies, and Patient/family education   Perl Kerney B. Rutherford Nail, M.S., CCC-SLP, Mining engineer Certified Brain Injury [place]  [place] Office [phone] Ascom [phone] Fax [phone]

## 2022-01-11 NOTE — Therapy (Signed)
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 a-fib x 1 episode patient states he "auto corrected" seen at [facility]   GERD (gastroesophageal reflux disease)    Headache    poor posture - none recently   History of kidney stones    Hydrocephalus (HCC)    Hyperlipidemia    Neuropathy    PSVT (paroxysmal supraventricular tachycardia) ([place]) 2009   Controlled with "breathing process"   Renal stone 9/08, 6/15   Wears dentures    full upper   Past Surgical History:  Procedure Laterality Date   CARDIAC CATHETERIZATION  2008   [place], [place]   CATARACT EXTRACTION [facility] Left 07/27/2015   Procedure: CATARACT EXTRACTION PHACO AND INTRAOCULAR LENS PLACEMENT ([place]);  Surgeon: Leandrew Koyanagi, MD;  Location: [place];  Service: Ophthalmology;  Laterality: Left;  TORIC   CATARACT EXTRACTION W/PHACO Right 08/24/2015   Procedure: CATARACT EXTRACTION PHACO AND INTRAOCULAR LENS PLACEMENT (IOC);  Surgeon: Leandrew Koyanagi, MD;  Location: [place];  Service: Ophthalmology;  Laterality: Right;  TORIC   COLONOSCOPY WITH PROPOFOL N/A 08/10/2020   Procedure: COLONOSCOPY WITH PROPOFOL;  Surgeon: Toledo, Benay Pike, MD;  Location: [facility];  Service: Gastroenterology;  Laterality: N/A;   HEMORROIDECTOMY  2010   banding   IR ANGIO INTRA EXTRACRAN SEL COM CAROTID INNOMINATE BILAT MOD SED  10/30/2019   IR ANGIO VERTEBRAL  SEL VERTEBRAL UNI R MOD SED  10/30/2019   IR RADIOLOGIST EVAL & MGMT  04/11/2020   IR US GUIDE VASC ACCESS RIGHT  10/30/2019   KIDNEY STONE SURGERY  9/08, 6/15   lithotripsy   LAPAROSCOPIC REVISION VENTRICULAR-PERITONEAL (V-P) SHUNT  01/08/2020   Procedure: LAPAROSCOPIC REVISION VENTRICULAR-PERITONEAL (V-P) SHUNT;  Surgeon: Consuella Lose, MD;  Location: [place];  Service: Neurosurgery;;   PROSTATE BIOPSY  2007   SKIN CANCER EXCISION     TOENAIL TRIMMING  11/2017   Dr. 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   Excessive daytime sleepiness 10/19/2019   Hip pain 07/15/2019   Medicare annual wellness visit, subsequent 02/06/2019   Memory changes 11/21/2018   Aortic atherosclerosis ([place]) 03/27/2018   PAD (peripheral artery disease) ([place]) 03/27/2018   Smoking history 03/27/2018   Chronic left shoulder pain 03/13/2018   History of systemic reaction to bee sting 01/28/2018   Prediabetes 01/28/2018   History of elevated PSA 01/28/2018  Fall with injury 05/22/2017   AAA (abdominal aortic aneurysm) ([place]) 05/22/2017   Frequent headaches 01/15/2017   Osteoarthritis 07/13/2016   Chronic foot pain 07/13/2016   Encounter for abdominal aortic aneurysm (AAA) screening 02/17/2016   Mixed hyperlipidemia 11/27/2015   Chronic back pain 11/27/2015   Chronic fatigue 11/27/2015   SVT (supraventricular tachycardia) ([place]) 09/16/2015   Paroxysmal atrial fibrillation ([place]) 09/16/2015   Frequent PVCs 09/16/2015   Esophageal reflux 09/16/2015    Preventative health care 09/16/2015    ONSET DATE: date of referral 12/12/2021  REFERRING DIAG: R41.89 (ICD-10-CM) - Cognitive impairment   PERTINENT HISTORY: Normal Pressure Hydrocephalus in a patient with cognitive impairment + minimal gait impairment + bladder issues + panic attacks 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 No typical spell was recorded during this study. No ictal or interictal epileptiform activity was noted.    Routine EEG 02/2021: This is an awake and sleep abnormal EEG due to presence of left temporal theta slowing was noted. No epileptiform discharges noted.   Dec 15, 2021 10:21 AM - audiology Right Score: 100% at 60dB HL Normal Symmetric No prior test for comparison Left Score: 100% at 60dB HL Normal Symmetric No prior test for comparison   Patient is not a hearing aid candidate at this time due to normal hearing from [phone].    THERAPY DIAG:  Cognitive communication deficit  Cognitive impairment  Rationale for Evaluation and Treatment Rehabilitation  SUBJECTIVE: pt had a great birthday with lots of friends coming over to celebrate  Pt accompanied by: significant other  PAIN:  Are you having pain? No  PATIENT GOALS: Pt states goal of improving memory of names, specifically names  of  family members. His wife states goal of helping pt have more interest in life, interacting more with activities.   OBJECTIVE:   TODAY'S TREATMENT: Skilled treatment session focused on pt's cognition goals. SLP facilitated session by providing the following interventions: Moderate assistance to recall rules of previously known game; maximal assistance to use external memory aid such as writing down the information Mild to moderate assistance to execute game Total assistance to increase mental flexibility, awareness of deficits, awareness of goals targeted within session     PATIENT EDUCATION: Education details: dementia Person educated: Patient and Spouse Education method: Explanation, Verbal cues, and Handouts Education comprehension: verbalized understanding and needs further education  GOALS: Goals reviewed with patient? Yes   SHORT TERM GOALS: Target date: 10 sessions   With moderate assistance, pt will use strategies to improve memory for important information with 75% acc. with minimal assistance (ie., white board, daily planner/calendar, Apps on phone).  Baseline: 25% Goal status: INITIAL   2.  Given a functional problem (ie., social problem, multi-step math word problem, logic puzzle) or scenario from daily life, patient will demonstrate error awareness and correct errors independently with at least 75% acc with moderate assistance.  Baseline:  unaware Goal status: INITIAL     LONG TERM GOALS: Target date: 03/26/2022 Given a functional problem a scenario from daily life, patient will demonstrate error awareness and correct errors independently with at least 90% accuracy with minimal assistance.  Baseline: unaware Goal status: INITIAL   2.  Given minimal assistance, pt will use strategies to improve memory for important information with 90% acc. (ie., white board, daily planner/calendar, Apps on phone).  Baseline: 25% Goal status: INITIAL  ASSESSMENT:  CLINICAL  IMPRESSION: Pt presents with mild to moderate cognitive deficits that are further exacerbated by decreased mental flexibility.   OBJECTIVE IMPAIRMENTS include attention, memory, awareness, and executive functioning. These impairments are limiting patient from managing medications, managing appointments, household responsibilities, and ADLs/IADLs. Factors affecting potential to achieve goals and functional outcome are ability to learn/carryover information, co-morbidities, medical prognosis, and severity of impairments. Patient will benefit from skilled SLP services to address above impairments and improve overall function.  REHAB POTENTIAL: Good  PLAN: SLP FREQUENCY: 1-2x/week  SLP DURATION: 12 weeks  PLANNED INTERVENTIONS: Cognitive reorganization, Internal/external aids, Functional tasks, SLP instruction and feedback, Compensatory strategies, and Patient/family education   Sonali Wivell B. Rutherford Nail, M.S., CCC-SLP, Mining engineer Certified Brain Injury [place]  [place] Office [phone] Ascom [phone] Fax [phone]

## 2022-01-14 ENCOUNTER — Emergency Department
Admission: EM | Admit: 2022-01-14 | Discharge: 2022-01-14 | Disposition: A | Discharge status: Home / Self Care | Payer: Medicare Other | Attending: Emergency Medicine | Admitting: Emergency Medicine

## 2022-01-14 ENCOUNTER — Other Ambulatory Visit: Payer: Self-pay

## 2022-01-14 ENCOUNTER — Emergency Department: Payer: Medicare Other

## 2022-01-14 DIAGNOSIS — I10 Essential (primary) hypertension: Secondary | ICD-10-CM | POA: Diagnosis not present

## 2022-01-14 DIAGNOSIS — Z7901 Long term (current) use of anticoagulants: Secondary | ICD-10-CM | POA: Diagnosis not present

## 2022-01-14 DIAGNOSIS — R0789 Other chest pain: Secondary | ICD-10-CM | POA: Diagnosis not present

## 2022-01-14 DIAGNOSIS — R079 Chest pain, unspecified: Secondary | ICD-10-CM | POA: Diagnosis not present

## 2022-01-14 DIAGNOSIS — R072 Precordial pain: Secondary | ICD-10-CM | POA: Insufficient documentation

## 2022-01-14 LAB — COMPREHENSIVE METABOLIC PANEL
ALT: 14 U/L (ref 0–44)
AST: 23 U/L (ref 15–41)
Albumin: 4 g/dL (ref 3.5–5.0)
Alkaline Phosphatase: 65 U/L (ref 38–126)
Anion gap: 6 (ref 5–15)
BUN: 21 mg/dL (ref 8–23)
CO2: 24 mmol/L (ref 22–32)
Calcium: 9.2 mg/dL (ref 8.9–10.3)
Chloride: 110 mmol/L (ref 98–111)
Creatinine, Ser: 0.98 mg/dL (ref 0.61–1.24)
GFR, Estimated: >60 mL/min (ref >60)
Glucose, Bld: 116 mg/dL — ABNORMAL HIGH (ref 70–99)
Potassium: 3.7 mmol/L (ref 3.5–5.1)
Sodium: 140 mmol/L (ref 135–145)
Total Bilirubin: 1 mg/dL (ref 0.3–1.2)
Total Protein: 6.9 g/dL (ref 6.5–8.1)

## 2022-01-14 LAB — CBC WITH DIFFERENTIAL/PLATELET
Abs Immature Granulocytes: 0.01 10*3/uL (ref 0.00–0.07)
Basophils Absolute: 0 10*3/uL (ref 0.0–0.1)
Basophils Relative: 1 %
Eosinophils Absolute: 0.1 10*3/uL (ref 0.0–0.5)
Eosinophils Relative: 3 %
HCT: 40.4 % (ref 39.0–52.0)
Hemoglobin: 13.7 g/dL (ref 13.0–17.0)
Immature Granulocytes: 0 %
Lymphocytes Relative: 21 %
Lymphs Abs: 0.9 10*3/uL (ref 0.7–4.0)
MCH: 33.3 pg (ref 26.0–34.0)
MCHC: 33.9 g/dL (ref 30.0–36.0)
MCV: 98.1 fL (ref 80.0–100.0)
Monocytes Absolute: 0.6 10*3/uL (ref 0.1–1.0)
Monocytes Relative: 16 %
Neutro Abs: 2.4 10*3/uL (ref 1.7–7.7)
Neutrophils Relative %: 59 %
Platelets: 120 10*3/uL — ABNORMAL LOW (ref 150–400)
RBC: 4.12 MIL/uL — ABNORMAL LOW (ref 4.22–5.81)
RDW: 12.6 % (ref 11.5–15.5)
WBC: 4.1 10*3/uL (ref 4.0–10.5)
nRBC: 0 % (ref 0.0–0.2)

## 2022-01-14 LAB — PROTIME-INR
INR: 1.2 (ref 0.8–1.2)
Prothrombin Time: 15.1 seconds (ref 11.4–15.2)

## 2022-01-14 LAB — TROPONIN I (HIGH SENSITIVITY)
Troponin I (High Sensitivity): 6 ng/L (ref <18)
Troponin I (High Sensitivity): 7 ng/L (ref <18)

## 2022-01-14 NOTE — Discharge Instructions (Addendum)

## 2022-01-14 NOTE — ED Provider Notes (Signed)
[facility] Provider Note    Event Date/Time   First MD Initiated Contact with Patient 01/14/22 0425     (approximate)   History   Chest Pain (Patient C/O left-sternal chest pain that radiated to the left shoulder. Pain has resolved at this time. )   HPI  Adam Juarez is a 81 y.o. male who states that he has a medical history of atrial fibrillation for which he takes Eliquis and sees Dr. Rockey Situ.  He presents by EMS for cute onset chest pain.  He reports that he had a normal day yesterday and felt like he was in his normal state of health.  He awoke at approximately 2 AM with sharp pain in the left upper part of his chest.  It got steadily worse over time rather than going away so he called EMS.  He took aspirin prior to arrival.  By the time EMS arrived, the pain was better and now it is almost completely resolved.  He denies having any associated symptoms including fever, shortness of breath, cough, nausea, vomiting, sweating, abdominal pain.  He has not had pain like this before.  No recent history of trauma.  No history of heart disease of which he is aware.     Physical Exam   Triage Vital Signs: ED Triage Vitals  Enc Vitals Group     BP 01/14/22 0420 (!) 150/87     Pulse Rate 01/14/22 0420 61     Resp 01/14/22 0420 18     Temp 01/14/22 0420 98.8 F (37.1 C)     Temp Source 01/14/22 0420 Oral     SpO2 01/14/22 0420 98 %     Weight 01/14/22 0422 90 kg (198 lb 6.6 oz)     Height 01/14/22 0422 1.778 m ('5\' 10"'$ )     Head Circumference --      Peak Flow --      Pain Score 01/14/22 0422 0     Pain Loc --      Pain Edu? --      Excl. in [place]? --     Most recent vital signs: Vitals:   01/14/22 0530 01/14/22 0600  BP: (!) 145/93 (!) 136/90  Pulse: 60 60  Resp: (!) 22 18  Temp:    SpO2: 93% 94%     General: Awake, no distress.  Well-appearing, appears younger than chronological age. CV:  Good peripheral perfusion.  Normal heart sounds.  Regular  rhythm and normal rate. Resp:  Normal effort.  Lungs are clear to auscultation bilaterally. Abd:  No distention.  No tenderness To palpation of his abdomen. Other:  No focal neurological deficits.   ED Results / Procedures / Treatments   Labs (all labs ordered are listed, but only abnormal results are displayed) Labs Reviewed  CBC WITH DIFFERENTIAL/PLATELET - Abnormal; Notable for the following components:      Result Value   RBC 4.12 (*)    Platelets 120 (*)    All other components within normal limits  COMPREHENSIVE METABOLIC PANEL - Abnormal; Notable for the following components:   Glucose, Bld 116 (*)    All other components within normal limits  PROTIME-INR  TROPONIN I (HIGH SENSITIVITY)  TROPONIN I (HIGH SENSITIVITY)     EKG  ED ECG REPORT I, Hinda Kehr, the attending physician, personally viewed and interpreted this ECG.  Date: 01/14/2022 EKG Time: 4:24 AM Rate: 59 Rhythm: Borderline sinus bradycardia QRS Axis: normal Intervals: normal ST/T Wave  abnormalities: Inverted T waves in lead III, otherwise unremarkable Narrative Interpretation: no evidence of acute ischemia    RADIOLOGY I viewed and interpreted the patient's 1 view chest x-ray and I see no evidence of pneumonia, pneumothorax, nor other acute abnormality.  I also read the radiologist's report, which confirmed no acute findings.    PROCEDURES:  Critical Care performed: No  .1-3 Lead EKG Interpretation  Performed by: Hinda Kehr, MD Authorized by: Hinda Kehr, MD     Interpretation: normal     ECG rate:  60   ECG rate assessment: normal     Rhythm: sinus rhythm     Ectopy: none     Conduction: normal      MEDICATIONS ORDERED IN ED: Medications - No data to display   IMPRESSION / MDM / [place] / ED COURSE  I reviewed the triage vital signs and the nursing notes.                              Differential diagnosis includes, but is not limited to, angina, ACS  including unstable angina, musculoskeletal strain, pneumonia, pneumothorax, AAS, PE.  Patient's presentation is most consistent with acute presentation with potential threat to life or bodily function.  Vital signs are generally normal other than some mild hypertension.  Labs/studies ordered: EKG, 1 view chest x-ray, high-sensitivity troponin, pro time-INR, CMP, CBC with differential.  EKG shows no sign of ischemia and sinus rhythm.  Patient's pain has almost completely resolved.  No indication for medical intervention.  I talked to the patient about chest pain rule out and plan for 2 high-sensitivity troponins.  His physical exam is reassuring with no significant or concerning abnormalities.  I anticipate that assuming his troponins remain negative and he remains asymptomatic, he should be appropriate for outpatient follow-up.  He is low risk for ACS based on HEAR score and Wells score for PE is 0, plus he is already on Eliquis.  The patient is on the cardiac monitor to evaluate for evidence of arrhythmia and/or significant heart rate changes.   Clinical Course as of 01/14/22 0740  Sun Jan 14, 2022  0502 CBC and pro time INR are essentially normal [CF]  0534 Troponin I (High Sensitivity): 6 [CF]  0705 Discussed case with Dr. Cherylann Banas who will assume care to follow-up on the troponin and disposition the patient, likely discharge for outpatient cardiology follow-up if the patient remains asymptomatic and has a negative second troponin. [CF]    Clinical Course User Index [CF] Hinda Kehr, MD     FINAL CLINICAL IMPRESSION(S) / ED DIAGNOSES   Final diagnoses:  Chest pain, unspecified type     Rx / DC Orders   ED Discharge Orders          Ordered    Ambulatory referral to Cardiology       Comments: If you have not heard from the Cardiology office within the next 72 hours please call [phone].   01/14/22 0739             Note:  This document was prepared using Dragon  voice recognition software and may include unintentional dictation errors.   Hinda Kehr, MD 01/14/22 [phone]

## 2022-01-16 ENCOUNTER — Encounter: Payer: Medicare Other | Admitting: Speech Pathology

## 2022-01-17 ENCOUNTER — Ambulatory Visit: Payer: Medicare Other | Attending: Neurology | Admitting: Speech Pathology

## 2022-01-17 DIAGNOSIS — R41841 Cognitive communication deficit: Secondary | ICD-10-CM | POA: Diagnosis not present

## 2022-01-17 DIAGNOSIS — R4189 Other symptoms and signs involving cognitive functions and awareness: Secondary | ICD-10-CM | POA: Diagnosis not present

## 2022-01-18 ENCOUNTER — Telehealth: Payer: Self-pay

## 2022-01-18 ENCOUNTER — Ambulatory Visit: Payer: Medicare Other | Admitting: Speech Pathology

## 2022-01-18 DIAGNOSIS — R4189 Other symptoms and signs involving cognitive functions and awareness: Secondary | ICD-10-CM

## 2022-01-18 DIAGNOSIS — R41841 Cognitive communication deficit: Secondary | ICD-10-CM | POA: Diagnosis not present

## 2022-01-18 NOTE — Progress Notes (Addendum)
Chronic Care Management Pharmacy Assistant   Name: Adam Juarez  MRN: [id number] DOB: July 06, 1940  Reason for Encounter: Non-CCM (Hosptial Follow Up)  Medications: Outpatient Encounter Medications as of 01/18/2022  Medication Sig   acetaminophen (TYLENOL) 500 MG tablet Take 1,000 mg by mouth every 8 (eight) hours as needed for moderate pain.   ascorbic acid (VITAMIN C) 500 MG tablet Take 500 mg by mouth daily.   atorvastatin (LIPITOR) 20 MG tablet Take 1 tablet (20 mg total) by mouth daily. for cholesterol.   calcium carbonate (TUMS - DOSED IN MG ELEMENTAL CALCIUM) 500 MG chewable tablet Chew 1 tablet by mouth. As needed   diclofenac Sodium (VOLTAREN) 1 % GEL Apply 1 application topically 4 (four) times daily as needed (pain). (Patient not taking: Reported on 07/12/2021)   ELIQUIS 5 MG TABS tablet TAKE 1 TABLET TWICE A DAY   EPINEPHRINE 0.3 mg/0.3 mL IJ SOAJ injection INJECT 0.3 ML (0.3 MG TOTAL) INTO THE MUSCLE AS NEEDED FOR ANAPHYLAXIS (Patient not taking: Reported on 08/04/2021)   escitalopram (LEXAPRO) 5 MG tablet Take 5 mg by mouth daily.   hydrocortisone (ANUSOL-HC) 25 MG suppository Place 1 suppository (25 mg total) rectally 2 (two) times daily.   Magnesium Cl-Calcium Carbonate (SLOW-MAG PO) Take 1 tablet by mouth daily.    Magnesium Oxide 500 MG TABS Take by mouth.   Melatonin 1 MG CAPS Take 1 mg by mouth at bedtime as needed (sleep).   metoprolol succinate (TOPROL-XL) 50 MG 24 hr tablet Take 1 tablet (50 mg total) by mouth 2 (two) times daily.   Multiple Vitamin (MULTIVITAMIN) tablet Take 1 tablet by mouth daily.   Multiple Vitamins-Minerals (PRESERVISION AREDS 2 PO) Take 1 capsule by mouth 2 (two) times daily.    omeprazole (PRILOSEC) 20 MG capsule Take 1 capsule (20 mg total) by mouth 2 (two) times daily before a meal. For heartburn.   Polyethyl Glycol-Propyl Glycol (SYSTANE OP) Apply 1 drop to eye 3 (three) times daily as needed (dry eyes).   Potassium Gluconate 550 MG TABS Take  550 mg by mouth daily.   Probiotic Product (PROBIOTIC DAILY PO) Take 1 capsule by mouth daily.    vitamin B-12 (CYANOCOBALAMIN) 1000 MCG tablet Take 1,000 mcg by mouth daily.   No facility-administered encounter medications on file as of 01/18/2022.   Reviewed hospital notes for details of recent visit. Patient has been contacted by Transitions of Care team: No  Admitted to the ED on 01/14/2022. Discharge date was 01/14/2022.  Discharged from [facility].   Discharge diagnosis (Principal Problem): Chest Pain Patient was discharged to Home  Brief summary of hospital course: Adam Juarez is a 81 y.o. male who states that he has a medical history of atrial fibrillation for which he takes Eliquis and sees Dr. Rockey Situ.  He presents by EMS for cute onset chest pain.  He reports that he had a normal day yesterday and felt like he was in his normal state of health.  He awoke at approximately 2 AM with sharp pain in the left upper part of his chest.  It got steadily worse over time rather than going away so he called EMS.  He took aspirin prior to arrival.  By the time EMS arrived, the pain was better and now it is almost completely resolved.  He denies having any associated symptoms including fever, shortness of breath, cough, nausea, vomiting, sweating, abdominal pain.  He has not had pain like this before.  No recent history  of trauma.  No history of heart disease of which he is aware.   Patient's presentation is most consistent with acute presentation with potential threat to life or bodily function.   Vital signs are generally normal other than some mild hypertension.  Labs/studies ordered: EKG, 1 view chest x-ray, high-sensitivity troponin, pro time-INR, CMP, CBC with differential.   EKG shows no sign of ischemia and sinus rhythm.   Patient's pain has almost completely resolved.  No indication for medical intervention.  I talked to the patient about chest pain rule out and plan for 2  high-sensitivity troponins.  His physical exam is reassuring with no significant or concerning abnormalities.  I anticipate that assuming his troponins remain negative and he remains asymptomatic, he should be appropriate for outpatient follow-up.  He is low risk for ACS based on HEAR score and Wells score for PE is 0, plus he is already on Eliquis.    New?Medications Started at [facility] Discharge:?? No changes  Medication Changes at Hospital Discharge: No changes  Medications Discontinued at Hospital Discharge: No changes  Medications that remain the same after Hospital Discharge:??  -All other medications will remain the same.    Next CCM appt: Non CCM  Other upcoming appts: No appointments scheduled within the next 30 days.  Charlene Brooke, PharmD notified and will determine if action is needed. Charlene Brooke, CPP notified  Marijean Niemann, [place] Clinical Pharmacy Assistant [phone]

## 2022-01-20 NOTE — Therapy (Signed)
OUTPATIENT SPEECH LANGUAGE PATHOLOGY TREATMENT NOTE   Patient Name: Adam Juarez MRN: [id number] DOB:05-18-1940, 81 y.o., male Today's Date: 01/20/2022  PCP: Alma Friendly, NP REFERRING PROVIDER: Jennings Books, MD  END OF SESSION:   End of Session - 01/20/22 1207     Visit Number 5    Number of Visits 25    Date for SLP Re-Evaluation 03/26/22    Authorization Type Medicare A/Medicare B/Tricare for Life    Progress Note Due on Visit 10    SLP Start Time 1500    SLP Stop Time  1610    SLP Time Calculation (min) 70 min    Activity Tolerance Patient tolerated treatment well             Past Medical History:  Diagnosis Date   Arthritis    fingers   Cancer ([place]) 1991, 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 a-fib x 1 episode patient states he "auto corrected" seen at [facility]   GERD (gastroesophageal reflux disease)    Headache    poor posture - none recently   History of kidney stones    Hydrocephalus (HCC)    Hyperlipidemia    Neuropathy    PSVT (paroxysmal supraventricular tachycardia) ([place]) 2009   Controlled with "breathing process"   Renal stone 9/08, 6/15   Wears dentures    full upper   Past Surgical History:  Procedure Laterality Date   CARDIAC CATHETERIZATION  2008   [place], [place]   CATARACT EXTRACTION [facility] Left 07/27/2015   Procedure: CATARACT EXTRACTION PHACO AND INTRAOCULAR LENS PLACEMENT ([place]);  Surgeon: Leandrew Koyanagi, MD;  Location: [place];  Service: Ophthalmology;  Laterality: Left;  TORIC   CATARACT EXTRACTION W/PHACO Right 08/24/2015   Procedure: CATARACT EXTRACTION PHACO AND INTRAOCULAR LENS PLACEMENT (IOC);  Surgeon: Leandrew Koyanagi, MD;  Location: [place];  Service: Ophthalmology;  Laterality: Right;  TORIC   COLONOSCOPY WITH PROPOFOL N/A 08/10/2020   Procedure: COLONOSCOPY WITH PROPOFOL;  Surgeon: Toledo, Benay Pike, MD;  Location: [facility];   Service: Gastroenterology;  Laterality: N/A;   HEMORROIDECTOMY  2010   banding   IR ANGIO INTRA EXTRACRAN SEL COM CAROTID INNOMINATE BILAT MOD SED  10/30/2019   IR ANGIO VERTEBRAL SEL VERTEBRAL UNI R MOD SED  10/30/2019   IR RADIOLOGIST EVAL & MGMT  04/11/2020   IR US GUIDE VASC ACCESS RIGHT  10/30/2019   KIDNEY STONE SURGERY  9/08, 6/15   lithotripsy   LAPAROSCOPIC REVISION VENTRICULAR-PERITONEAL (V-P) SHUNT  01/08/2020   Procedure: LAPAROSCOPIC REVISION VENTRICULAR-PERITONEAL (V-P) SHUNT;  Surgeon: Consuella Lose, MD;  Location: [place];  Service: Neurosurgery;;   PROSTATE BIOPSY  2007   SKIN CANCER EXCISION     TOENAIL TRIMMING  11/2017   Dr. 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   Excessive daytime sleepiness 10/19/2019   Hip pain 07/15/2019   Medicare annual wellness visit, subsequent 02/06/2019   Memory changes 11/21/2018   Aortic atherosclerosis ([place]) 03/27/2018  PAD (peripheral artery disease) ([place]) 03/27/2018   Smoking history 03/27/2018   Chronic left shoulder pain 03/13/2018   History of systemic reaction to bee sting 01/28/2018   Prediabetes 01/28/2018   History of elevated PSA 01/28/2018   Fall with injury 05/22/2017   AAA (abdominal aortic aneurysm) ([place]) 05/22/2017   Frequent headaches 01/15/2017   Osteoarthritis 07/13/2016   Chronic foot pain 07/13/2016   Encounter for abdominal aortic aneurysm (AAA) screening 02/17/2016   Mixed hyperlipidemia 11/27/2015   Chronic back pain 11/27/2015   Chronic fatigue  11/27/2015   SVT (supraventricular tachycardia) ([place]) 09/16/2015   Paroxysmal atrial fibrillation ([place]) 09/16/2015   Frequent PVCs 09/16/2015   Esophageal reflux 09/16/2015   Preventative health care 09/16/2015    ONSET DATE: date of referral 12/12/2021  REFERRING DIAG: R41.89 (ICD-10-CM) - Cognitive impairment   PERTINENT HISTORY: Normal Pressure Hydrocephalus in a patient with cognitive impairment + minimal gait impairment + bladder issues + panic attacks 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 No typical spell was recorded during this study. No ictal or interictal epileptiform activity was noted.    Routine EEG 02/2021: This is an awake and sleep abnormal EEG due to presence of left temporal theta slowing was noted. No epileptiform discharges noted.   Dec 15, 2021 10:21 AM - audiology Right Score: 100% at 60dB HL Normal Symmetric No prior test for comparison Left Score: 100% at 60dB HL Normal Symmetric No prior test for comparison   Patient is not a hearing aid candidate at this time due to normal hearing from [phone] Hz.    THERAPY DIAG:  Cognitive communication deficit  Cognitive impairment  Rationale for Evaluation and Treatment Rehabilitation  SUBJECTIVE: pt reports not feeling well d/t diverticulitis    Pt accompanied by: significant other  PAIN:  Are you having pain? No  PATIENT GOALS: Pt states goal of improving memory of names, specifically names of  family members. His wife states goal of helping pt have more interest in life, interacting more with activities.   OBJECTIVE:   TODAY'S TREATMENT: Skilled treatment session focused on pt's cognition goals. SLP facilitated session by providing the following interventions:  Moderate assistance required to complete basic 3x3 grid reasoning puzzle. SLP introduced problem solving strategy with increased ability to solve puzzle but moderate cues to utilize problem solving strategy.      PATIENT EDUCATION: Education details: dementia Person educated: Patient and Spouse Education method: Explanation, Verbal cues, and Handouts Education comprehension: verbalized understanding and needs further education  GOALS: Goals reviewed with patient? Yes   SHORT TERM GOALS: Target date: 10 sessions   With moderate assistance, pt will use strategies to improve memory for important information with 75% acc. with minimal assistance (ie., white board, daily planner/calendar, Apps on phone).  Baseline: 25% Goal status: INITIAL   2.  Given a functional problem (ie., social problem, multi-step math word problem, logic puzzle) or  scenario from daily life, patient will demonstrate error awareness and correct errors independently with at least 75% acc with moderate assistance.  Baseline: unaware Goal status: INITIAL     LONG TERM GOALS: Target date: 03/26/2022 Given a functional problem a scenario from daily life, patient will demonstrate error awareness and correct errors independently with at least 90% accuracy with minimal assistance.  Baseline: unaware Goal status: INITIAL   2.  Given minimal assistance, pt will use strategies to improve memory for important information with 90% acc. (ie., white board, daily planner/calendar, Apps on phone).   Baseline: 25% Goal status: INITIAL  ASSESSMENT:  CLINICAL IMPRESSION: Pt presents with mild to moderate cognitive deficits that are further exacerbated by decreased mental flexibility.   OBJECTIVE IMPAIRMENTS include attention, memory, awareness, and executive functioning. These impairments are limiting patient from managing medications, managing appointments, household responsibilities, and ADLs/IADLs. Factors affecting potential to achieve goals and functional outcome are ability to learn/carryover information, co-morbidities, medical prognosis, and severity of impairments. Patient will benefit from skilled SLP services to address above impairments and improve overall function.  REHAB POTENTIAL: Good  PLAN: SLP FREQUENCY: 1-2x/week  SLP DURATION: 12 weeks  PLANNED INTERVENTIONS: Cognitive reorganization, Internal/external aids, Functional tasks, SLP instruction and feedback, Compensatory strategies, and Patient/family education   Jovane Foutz B. Rutherford Nail, M.S., CCC-SLP, Mining engineer Certified Brain Injury [place]  [place] Office [phone] Ascom [phone] Fax [phone]

## 2022-01-20 NOTE — Therapy (Signed)
OUTPATIENT SPEECH LANGUAGE PATHOLOGY TREATMENT NOTE   Patient Name: Adam Juarez MRN: [id number] DOB:[birth year], 81 y.o., male Today's Date: 01/20/2022  PCP: Alma Friendly, NP REFERRING PROVIDER: Jennings Books, MD  END OF SESSION:   End of Session - 01/20/22 1218     Visit Number 6    Number of Visits 25    Date for SLP Re-Evaluation 03/26/22    Authorization Type Medicare A/Medicare B/Tricare for Life    Progress Note Due on Visit 10    SLP Start Time 1315    SLP Stop Time  1400    SLP Time Calculation (min) 45 min    Activity Tolerance Patient tolerated treatment well             Past Medical History:  Diagnosis Date   Arthritis    fingers   Cancer ([place]) 1991, 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 a-fib x 1 episode patient states he "auto corrected" seen at [facility]   GERD (gastroesophageal reflux disease)    Headache    poor posture - none recently   History of kidney stones    Hydrocephalus (HCC)    Hyperlipidemia    Neuropathy    PSVT (paroxysmal supraventricular tachycardia) ([place]) 2009   Controlled with "breathing process"   Renal stone 9/08, 6/15   Wears dentures    full upper   Past Surgical History:  Procedure Laterality Date   CARDIAC CATHETERIZATION  2008   [place], [place]   CATARACT EXTRACTION [facility] Left 07/27/2015   Procedure: CATARACT EXTRACTION PHACO AND INTRAOCULAR LENS PLACEMENT ([place]);  Surgeon: Leandrew Koyanagi, MD;  Location: [place];  Service: Ophthalmology;  Laterality: Left;  TORIC   CATARACT EXTRACTION W/PHACO Right 08/24/2015   Procedure: CATARACT EXTRACTION PHACO AND INTRAOCULAR LENS PLACEMENT (IOC);  Surgeon: Leandrew Koyanagi, MD;  Location: [place];  Service: Ophthalmology;  Laterality: Right;  TORIC   COLONOSCOPY WITH PROPOFOL N/A 08/10/2020   Procedure: COLONOSCOPY WITH PROPOFOL;  Surgeon: Toledo, Benay Pike, MD;  Location: [facility];   Service: Gastroenterology;  Laterality: N/A;   HEMORROIDECTOMY  2010   banding   IR ANGIO INTRA EXTRACRAN SEL COM CAROTID INNOMINATE BILAT MOD SED  10/30/2019   IR ANGIO VERTEBRAL SEL VERTEBRAL UNI R MOD SED  10/30/2019   IR RADIOLOGIST EVAL & MGMT  04/11/2020   IR US GUIDE VASC ACCESS RIGHT  10/30/2019   KIDNEY STONE SURGERY  9/08, 6/15   lithotripsy   LAPAROSCOPIC REVISION VENTRICULAR-PERITONEAL (V-P) SHUNT  01/08/2020   Procedure: LAPAROSCOPIC REVISION VENTRICULAR-PERITONEAL (V-P) SHUNT;  Surgeon: Consuella Lose, MD;  Location: [place];  Service: Neurosurgery;;   PROSTATE BIOPSY  2007   SKIN CANCER EXCISION     TOENAIL TRIMMING  11/2017   Dr. 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   Excessive daytime sleepiness 10/19/2019   Hip pain 07/15/2019   Medicare annual wellness visit, subsequent 02/06/2019   Memory changes 11/21/2018   Aortic atherosclerosis ([place]) 03/27/2018  PAD (peripheral artery disease) ([place]) 03/27/2018   Smoking history 03/27/2018   Chronic left shoulder pain 03/13/2018   History of systemic reaction to bee sting 01/28/2018   Prediabetes 01/28/2018   History of elevated PSA 01/28/2018   Fall with injury 05/22/2017   AAA (abdominal aortic aneurysm) ([place]) 05/22/2017   Frequent headaches 01/15/2017   Osteoarthritis 07/13/2016   Chronic foot pain 07/13/2016   Encounter for abdominal aortic aneurysm (AAA) screening 02/17/2016   Mixed hyperlipidemia 11/27/2015   Chronic back pain 11/27/2015   Chronic fatigue  11/27/2015   SVT (supraventricular tachycardia) ([place]) 09/16/2015   Paroxysmal atrial fibrillation ([place]) 09/16/2015   Frequent PVCs 09/16/2015   Esophageal reflux 09/16/2015   Preventative health care 09/16/2015    ONSET DATE: date of referral 12/12/2021  REFERRING DIAG: R41.89 (ICD-10-CM) - Cognitive impairment   PERTINENT HISTORY: Normal Pressure Hydrocephalus in a patient with cognitive impairment + minimal gait impairment + bladder issues + panic attacks 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 No typical spell was recorded during this study. No ictal or interictal epileptiform activity was noted.    Routine EEG 02/2021: This is an awake and sleep abnormal EEG due to presence of left temporal theta slowing was noted. No epileptiform discharges noted.   Dec 15, 2021 10:21 AM - audiology Right Score: 100% at 60dB HL Normal Symmetric No prior test for comparison Left Score: 100% at 60dB HL Normal Symmetric No prior test for comparison   Patient is not a hearing aid candidate at this time due to normal hearing from [phone].    THERAPY DIAG:  Cognitive communication deficit  Cognitive impairment  Rationale for Evaluation and Treatment Rehabilitation  SUBJECTIVE: pt reports feeling better  Pt accompanied by:  significant other  PAIN:  Are you having pain? No  PATIENT GOALS: Pt states goal of improving memory of names, specifically names of  family members. His wife states goal of helping pt have more interest in life, interacting more with activities.   OBJECTIVE:   TODAY'S TREATMENT: Skilled treatment session focused on pt's cognition goals. SLP facilitated session by providing the following interventions:  Facilitated conversation about ways pt would like to be more organized to promote increased recall of object location and effeciency with task completion. Pt had difficulty understanding concept. Pt with continued agnosia as he intermittently states "well you are a women, you won't understand."    PATIENT EDUCATION: Education details: dementia Person educated: Patient and Spouse Education method: Explanation, Verbal cues, and Handouts Education comprehension: verbalized understanding and needs further education  GOALS: Goals reviewed with patient? Yes   SHORT TERM GOALS: Target date: 10 sessions   With moderate assistance, pt will use strategies to improve memory for important information with 75% acc. with minimal assistance (ie., white board, daily planner/calendar, Apps on phone).  Baseline: 25% Goal status: INITIAL   2.  Given a functional problem (ie.,  social problem, multi-step math word problem, logic puzzle) or scenario from daily life, patient will demonstrate error awareness and correct errors independently with at least 75% acc with moderate assistance.  Baseline: unaware Goal status: INITIAL     LONG TERM GOALS: Target date: 03/26/2022 Given a functional problem a scenario from daily life, patient will demonstrate error awareness and correct errors independently with at least 90% accuracy with minimal assistance.  Baseline: unaware Goal status: INITIAL   2.  Given minimal assistance, pt will use strategies to improve memory for important information with 90% acc.  (ie., white board, daily planner/calendar, Apps on phone).  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 Pt and his wife are eager to learn as they have started walking and have placed a call to [employer] to see if he would be able to [employer]unter.   OBJECTIVE IMPAIRMENTS include attention, memory, awareness, and executive functioning. These impairments are limiting patient from managing medications, managing appointments, household responsibilities, and ADLs/IADLs. Factors affecting potential to achieve goals and functional outcome are ability to learn/carryover information, co-morbidities, medical prognosis, and severity of impairments. Patient will benefit from skilled SLP services to address above impairments and improve overall function.  REHAB POTENTIAL: Good  PLAN: SLP FREQUENCY: 1-2x/week  SLP DURATION: 12 weeks  PLANNED INTERVENTIONS: Cognitive reorganization, Internal/external aids, Functional tasks, SLP instruction and feedback, Compensatory strategies, and Patient/family education   Denasia Venn B. Rutherford Nail, M.S., CCC-SLP, Mining engineer Certified Brain Injury [place]  [place] Office [phone] Ascom [phone] Fax [phone]

## 2022-01-22 ENCOUNTER — Ambulatory Visit: Payer: Medicare Other | Admitting: Speech Pathology

## 2022-01-22 DIAGNOSIS — R4189 Other symptoms and signs involving cognitive functions and awareness: Secondary | ICD-10-CM

## 2022-01-22 DIAGNOSIS — R41841 Cognitive communication deficit: Secondary | ICD-10-CM

## 2022-01-22 NOTE — Therapy (Signed)
OUTPATIENT SPEECH LANGUAGE PATHOLOGY TREATMENT NOTE   Patient Name: Adam Juarez MRN: [id number] DOB:1940/11/30, 81 y.o., male Today's Date: 01/22/2022  PCP: Alma Friendly, NP REFERRING PROVIDER: Jennings Books, MD  END OF SESSION:   End of Session - 01/22/22 1310     Visit Number 7    Number of Visits 25    Date for SLP Re-Evaluation 03/26/22    Authorization Type Medicare A/Medicare B/Tricare for Life    Progress Note Due on Visit 10    SLP Start Time 1300    SLP Stop Time  1400    SLP Time Calculation (min) 60 min    Activity Tolerance Patient tolerated treatment well             Past Medical History:  Diagnosis Date   Arthritis    fingers   Cancer ([place]) 1991, 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 a-fib x 1 episode patient states he "auto corrected" seen at [facility]   GERD (gastroesophageal reflux disease)    Headache    poor posture - none recently   History of kidney stones    Hydrocephalus (HCC)    Hyperlipidemia    Neuropathy    PSVT (paroxysmal supraventricular tachycardia) ([place]) 2009   Controlled with "breathing process"   Renal stone 9/08, 6/15   Wears dentures    full upper   Past Surgical History:  Procedure Laterality Date   CARDIAC CATHETERIZATION  2008   [place], [place]   CATARACT EXTRACTION [facility] Left 07/27/2015   Procedure: CATARACT EXTRACTION PHACO AND INTRAOCULAR LENS PLACEMENT ([place]);  Surgeon: Leandrew Koyanagi, MD;  Location: [place];  Service: Ophthalmology;  Laterality: Left;  TORIC   CATARACT EXTRACTION W/PHACO Right 08/24/2015   Procedure: CATARACT EXTRACTION PHACO AND INTRAOCULAR LENS PLACEMENT (IOC);  Surgeon: Leandrew Koyanagi, MD;  Location: [place];  Service: Ophthalmology;  Laterality: Right;  TORIC   COLONOSCOPY WITH PROPOFOL N/A 08/10/2020   Procedure: COLONOSCOPY WITH PROPOFOL;  Surgeon: Toledo, Benay Pike, MD;  Location: [facility] ENDOS[facility];   Service: Gastroenterology;  Laterality: N/A;   HEMORROIDECTOMY  2010   banding   IR ANGIO INTRA EXTRACRAN SEL COM CAROTID INNOMINATE BILAT MOD SED  10/30/2019   IR ANGIO VERTEBRAL SEL VERTEBRAL UNI R MOD SED  10/30/2019   IR RADIOLOGIST EVAL & MGMT  04/11/2020   IR US GUIDE VASC ACCESS RIGHT  10/30/2019   KIDNEY STONE SURGERY  9/08, 6/15   lithotripsy   LAPAROSCOPIC REVISION VENTRICULAR-PERITONEAL (V-P) SHUNT  01/08/2020   Procedure: LAPAROSCOPIC REVISION VENTRICULAR-PERITONEAL (V-P) SHUNT;  Surgeon: Consuella Lose, MD;  Location: [place];  Service: Neurosurgery;;   PROSTATE BIOPSY  2007   SKIN CANCER EXCISION     TOENAIL TRIMMING  11/2017   Dr. 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   Excessive daytime sleepiness 10/19/2019   Hip pain 07/15/2019   Medicare annual wellness visit, subsequent 02/06/2019   Memory changes 11/21/2018   Aortic atherosclerosis ([place]) 03/27/2018  PAD (peripheral artery disease) ([place]) 03/27/2018   Smoking history 03/27/2018   Chronic left shoulder pain 03/13/2018   History of systemic reaction to bee sting 01/28/2018   Prediabetes 01/28/2018   History of elevated PSA 01/28/2018   Fall with injury 05/22/2017   AAA (abdominal aortic aneurysm) ([place]) 05/22/2017   Frequent headaches 01/15/2017   Osteoarthritis 07/13/2016   Chronic foot pain 07/13/2016   Encounter for abdominal aortic aneurysm (AAA) screening 02/17/2016   Mixed hyperlipidemia 11/27/2015   Chronic back pain 11/27/2015   Chronic fatigue  11/27/2015   SVT (supraventricular tachycardia) ([place]) 09/16/2015   Paroxysmal atrial fibrillation ([place]) 09/16/2015   Frequent PVCs 09/16/2015   Esophageal reflux 09/16/2015   Preventative health care 09/16/2015    ONSET DATE: date of referral 12/12/2021  REFERRING DIAG: R41.89 (ICD-10-CM) - Cognitive impairment   PERTINENT HISTORY: Normal Pressure Hydrocephalus in a patient with cognitive impairment + minimal gait impairment + bladder issues + panic attacks 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 No typical spell was recorded during this study. No ictal or interictal epileptiform activity was noted.    Routine EEG 02/2021: This is an awake and sleep abnormal EEG due to presence of left temporal theta slowing was noted. No epileptiform discharges noted.   Dec 15, 2021 10:21 AM - audiology Right Score: 100% at 60dB HL Normal Symmetric No prior test for comparison Left Score: 100% at 60dB HL Normal Symmetric No prior test for comparison   Patient is not a hearing aid candidate at this time due to normal hearing from [phone].    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 No  PATIENT GOALS: Pt states goal of improving memory of names, specifically names of  family members. His wife states goal of helping pt have more interest in life, interacting more with activities.   OBJECTIVE:   TODAY'S TREATMENT: Skilled treatment session focused on pt's cognition goals. SLP facilitated session by providing the following interventions:  Recommended several "experiments/crafts" that might be fun for pt to utilize during his time with the kids at church  SLP provided recommendations for use of external memory aids. While pt arrived to session with small note pad/pen and he wrote down information appropriately in the notepad, he is not able to execute use of notepad for recall of information because he is unable to find the notepad once he is at home. Pt describes himself as being "disorganized" and isn't interested in becoming more organized. Pt has a mentally inflexible response to most recommendations provided by this writer aimed at improving memory.     PATIENT EDUCATION: Education details: dementia Person educated: Patient and Spouse Education method: Explanation, Verbal cues, and Handouts Education comprehension: verbalized  understanding and needs further education  GOALS: Goals reviewed with patient? Yes   SHORT TERM GOALS: Target date: 10 sessions   With moderate assistance, pt will use strategies to improve memory for important information with 75% acc. with minimal assistance (ie., white board, daily planner/calendar, Apps on phone).  Baseline: 25% Goal status: INITIAL   2.  Given a functional problem (ie., social problem, multi-step math word problem, logic puzzle) or scenario from daily life, patient will demonstrate error awareness and correct errors independently with at least 75% acc with moderate assistance.  Baseline: unaware Goal status: INITIAL     LONG TERM GOALS: Target  date: 03/26/2022 Given a functional problem a scenario from daily life, patient will demonstrate error awareness and correct errors independently with at least 90% accuracy with minimal assistance.  Baseline: unaware Goal status: INITIAL   2.  Given minimal assistance, pt will use strategies to improve memory for important information with 90% acc. (ie., white board, daily planner/calendar, Apps on phone).  Baseline: 25% Goal status: INITIAL  ASSESSMENT:  CLINICAL IMPRESSION: Pt's prognosis is guarded as he continues to struggle with use of compensatory memory aids. He continues to align himself with the thought of "if I try harder, my brain will get better." He is aware that this strategy is not working and views using compensatory strategies as "giving into dementia." Many analogies have been used to compare the "tools" presented in ST sessions to those used to treat his left eye but pt continues to have difficulty equating the two. His wife will reach out to pt's neurologist for possible Namenda prescription. However, pt states that he is unwilling to take more medicine. Will continue ST session for another week and re-evaluate the functional effectiveness of services at that time.   OBJECTIVE IMPAIRMENTS include attention, memory, awareness, and executive functioning. These impairments are limiting patient from managing medications, managing appointments, household responsibilities, and ADLs/IADLs. Factors affecting potential to achieve goals and functional outcome are ability to learn/carryover information, co-morbidities, medical prognosis, and severity of impairments. Patient will benefit from skilled SLP services to address above impairments and improve overall function.  REHAB POTENTIAL: Good  PLAN: SLP FREQUENCY: 1-2x/week  SLP DURATION: 12 weeks  PLANNED INTERVENTIONS: Cognitive reorganization, Internal/external aids, Functional tasks, SLP instruction and feedback, Compensatory  strategies, and Patient/family education   Xitlalic Maslin B. Rutherford Nail, M.S., CCC-SLP, Mining engineer Certified Brain Injury [place]  [address] Office [phone] Ascom [phone] Fax [phone]

## 2022-01-24 ENCOUNTER — Ambulatory Visit: Payer: Medicare Other | Admitting: Speech Pathology

## 2022-01-24 DIAGNOSIS — R41841 Cognitive communication deficit: Secondary | ICD-10-CM

## 2022-01-24 DIAGNOSIS — R4189 Other symptoms and signs involving cognitive functions and awareness: Secondary | ICD-10-CM

## 2022-01-24 NOTE — Therapy (Signed)
OUTPATIENT SPEECH LANGUAGE PATHOLOGY TREATMENT NOTE   Patient Name: Adam Juarez MRN: [id number] DOB:June 20, 1940, 81 y.o., male Today's Date: 01/24/2022  PCP: Alma Friendly, NP REFERRING PROVIDER: Jennings Books, MD  END OF SESSION:   End of Session - 01/24/22 1310     Visit Number 8    Number of Visits 25    Date for SLP Re-Evaluation 03/26/22    Authorization Type Medicare A/Medicare B/Tricare for Life    Progress Note Due on Visit 10    SLP Start Time 1300    SLP Stop Time  1400    SLP Time Calculation (min) 60 min    Activity Tolerance Patient tolerated treatment well             Past Medical History:  Diagnosis Date   Arthritis    fingers   Cancer ([place]) 1991, 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 a-fib x 1 episode patient states he "auto corrected" seen at [facility]   GERD (gastroesophageal reflux disease)    Headache    poor posture - none recently   History of kidney stones    Hydrocephalus (HCC)    Hyperlipidemia    Neuropathy    PSVT (paroxysmal supraventricular tachycardia) ([place]) 2009   Controlled with "breathing process"   Renal stone 9/08, 6/15   Wears dentures    full upper   Past Surgical History:  Procedure Laterality Date   CARDIAC CATHETERIZATION  2008   [place], [place]   CATARACT EXTRACTION [facility] Left 07/27/2015   Procedure: CATARACT EXTRACTION PHACO AND INTRAOCULAR LENS PLACEMENT ([place]);  Surgeon: Leandrew Koyanagi, MD;  Location: [place];  Service: Ophthalmology;  Laterality: Left;  TORIC   CATARACT EXTRACTION W/PHACO Right 08/24/2015   Procedure: CATARACT EXTRACTION PHACO AND INTRAOCULAR LENS PLACEMENT (IOC);  Surgeon: Leandrew Koyanagi, MD;  Location: [place];  Service: Ophthalmology;  Laterality: Right;  TORIC   COLONOSCOPY WITH PROPOFOL N/A 08/10/2020   Procedure: COLONOSCOPY WITH PROPOFOL;  Surgeon: Toledo, Benay Pike, MD;  Location: [facility];   Service: Gastroenterology;  Laterality: N/A;   HEMORROIDECTOMY  2010   banding   IR ANGIO INTRA EXTRACRAN SEL COM CAROTID INNOMINATE BILAT MOD SED  10/30/2019   IR ANGIO VERTEBRAL SEL VERTEBRAL UNI R MOD SED  10/30/2019   IR RADIOLOGIST EVAL & MGMT  04/11/2020   IR US GUIDE VASC ACCESS RIGHT  10/30/2019   KIDNEY STONE SURGERY  9/08, 6/15   lithotripsy   LAPAROSCOPIC REVISION VENTRICULAR-PERITONEAL (V-P) SHUNT  01/08/2020   Procedure: LAPAROSCOPIC REVISION VENTRICULAR-PERITONEAL (V-P) SHUNT;  Surgeon: Consuella Lose, MD;  Location: [place];  Service: Neurosurgery;;   PROSTATE BIOPSY  2007   SKIN CANCER EXCISION     TOENAIL TRIMMING  11/2017   Dr. 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   Excessive daytime sleepiness 10/19/2019   Hip pain 07/15/2019   Medicare annual wellness visit, subsequent 02/06/2019   Memory changes 11/21/2018   Aortic atherosclerosis ([place]) 03/27/2018  PAD (peripheral artery disease) ([place]) 03/27/2018   Smoking history 03/27/2018   Chronic left shoulder pain 03/13/2018   History of systemic reaction to bee sting 01/28/2018   Prediabetes 01/28/2018   History of elevated PSA 01/28/2018   Fall with injury 05/22/2017   AAA (abdominal aortic aneurysm) ([place]) 05/22/2017   Frequent headaches 01/15/2017   Osteoarthritis 07/13/2016   Chronic foot pain 07/13/2016   Encounter for abdominal aortic aneurysm (AAA) screening 02/17/2016   Mixed hyperlipidemia 11/27/2015   Chronic back pain 11/27/2015   Chronic fatigue  11/27/2015   SVT (supraventricular tachycardia) ([place]) 09/16/2015   Paroxysmal atrial fibrillation ([place]) 09/16/2015   Frequent PVCs 09/16/2015   Esophageal reflux 09/16/2015   Preventative health care 09/16/2015    ONSET DATE: date of referral 12/12/2021  REFERRING DIAG: R41.89 (ICD-10-CM) - Cognitive impairment   PERTINENT HISTORY: Normal Pressure Hydrocephalus in a patient with cognitive impairment + minimal gait impairment + bladder issues + panic attacks 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 No typical spell was recorded during this study. No ictal or interictal epileptiform activity was noted.    Routine EEG 02/2021: This is an awake and sleep abnormal EEG due to presence of left temporal theta slowing was noted. No epileptiform discharges noted.   Dec 15, 2021 10:21 AM - audiology Right Score: 100% at 60dB HL Normal Symmetric No prior test for comparison Left Score: 100% at 60dB HL Normal Symmetric No prior test for comparison   Patient is not a hearing aid candidate at this time due to normal hearing from [phone].    THERAPY DIAG:  No diagnosis found.  Rationale for Evaluation and Treatment Rehabilitation  SUBJECTIVE: pt voices concern with dementia diagnosis   Pt accompanied by: significant  other  PAIN:  Are you having pain? No  PATIENT GOALS: Pt states goal of improving memory of names, specifically names of  family members. His wife states goal of helping pt have more interest in life, interacting more with activities.   OBJECTIVE:   TODAY'S TREATMENT: Skilled treatment session focused on pt's cognition goals. SLP facilitated session by providing the following interventions:  Pt with questions as to how dementia diagnosis was made vs age related cognitive changes Initial chart reviewed with pt and his wife, unable to complete chart review as pt has difficulty understanding concepts and requires moderate assistance for comprehension and struggles with redirection    PATIENT EDUCATION: Education details: dementia Person educated: Patient and Spouse Education method: Explanation, Verbal cues, and Handouts Education comprehension: verbalized understanding and needs further education  GOALS: Goals reviewed with patient? Yes   SHORT TERM GOALS: Target date: 10 sessions   With moderate assistance, pt will use strategies to improve memory for important information with 75% acc. with minimal assistance (ie., white board, daily planner/calendar, Apps on phone).  Baseline: 25% Goal status: INITIAL   2.  Given a functional problem (ie.,  social problem, multi-step math word problem, logic puzzle) or scenario from daily life, patient will demonstrate error awareness and correct errors independently with at least 75% acc with moderate assistance.  Baseline: unaware Goal status: INITIAL     LONG TERM GOALS: Target date: 03/26/2022 Given a functional problem a scenario from daily life, patient will demonstrate error awareness and correct errors independently with at least 90% accuracy with minimal assistance.  Baseline: unaware Goal status: INITIAL   2.  Given minimal assistance, pt will use strategies to improve memory for important information with 90% acc. (ie., white  board, daily planner/calendar, Apps on phone).  Baseline: 25% Goal status: INITIAL  ASSESSMENT:  CLINICAL IMPRESSION: Pt's prognosis is guarded as he continues to struggle with dementia diagnosis and cognitive impairment. Will strive to continue providing education in an effort to develop awareness. List of support groups provided to pt and his wife as well.   OBJECTIVE IMPAIRMENTS include attention, memory, awareness, and executive functioning. These impairments are limiting patient from managing medications, managing appointments, household responsibilities, and ADLs/IADLs. Factors affecting potential to achieve goals and functional outcome are ability to learn/carryover information, co-morbidities, medical prognosis, and severity of impairments. Patient will benefit from skilled SLP services to address above impairments and improve overall function.  REHAB POTENTIAL: Good  PLAN: SLP FREQUENCY: 1-2x/week  SLP DURATION: 12 weeks  PLANNED INTERVENTIONS: Cognitive reorganization, Internal/external aids, Functional tasks, SLP instruction and feedback, Compensatory strategies, and Patient/family education  [place] [phone] [email or web address]  Project CARE [facility] [address], [place], [place] [phone] or 6296597005 www.[email or web address] Services Include: caregiver counseling; care consultation; dementia-specific education; caregiver assessments; respite care; community based classes.   [facility] [address], [address], [place], [place] [phone] Services Include: Clinical care and education related to alzheimer's and related disorders (Physician referral required)  Alzheimer's Association Toll Free Helpline: (800) 8504747761 [employer] Services Include: Family support and education; support groups; community education;  public awareness; research funding support  [employer], Positive Approach to Care http://teepasnow.com  [facility] [place] [address], [place], [place], [place] [phone] www.[email or web address] Services Include: Family support and education; support groups; community education; public awareness; research funding support.   [place] [phone] [address], [place], [place] [phone] www.[email or web address] Services Include: Caregiver support Group; Respite; Education; Supplemental Services; Referral assistance; Counseling; Annual [employer].   Association for Frontotemporal [facility]eneration [employer] [address], [place], [place] [address] [phone]  Toll-free Helpline: [phone] Services Include: Caregiver support groups; caregiver education; research promotion and funding; public awareness; advocacy.   Caregiver Navigator Helpline [phone]    [place] Raben B. Rutherford Nail, M.S., CCC-SLP, Mining engineer Certified Brain Injury [place]  [place] Office [phone] Ascom [phone] Fax [phone]

## 2022-01-29 ENCOUNTER — Ambulatory Visit: Payer: Medicare Other | Admitting: Speech Pathology

## 2022-01-29 DIAGNOSIS — R41841 Cognitive communication deficit: Secondary | ICD-10-CM

## 2022-01-29 DIAGNOSIS — R4189 Other symptoms and signs involving cognitive functions and awareness: Secondary | ICD-10-CM

## 2022-01-29 NOTE — Therapy (Signed)
OUTPATIENT SPEECH LANGUAGE PATHOLOGY TREATMENT NOTE   Patient Name: Adam Juarez MRN: [id number] DOB:02-Aug-1940, 81 y.o., male Today's Date: 01/29/2022  PCP: Alma Friendly, NP REFERRING PROVIDER: Jennings Books, MD  END OF SESSION:   End of Session - 01/29/22 1408     Visit Number 9    Number of Visits 25    Date for SLP Re-Evaluation 03/26/22    Authorization Type Medicare A/Medicare B/Tricare for Life    Progress Note Due on Visit 10    SLP Start Time 1300    SLP Stop Time  1408    SLP Time Calculation (min) 68 min    Activity Tolerance Patient tolerated treatment well             Past Medical History:  Diagnosis Date   Arthritis    fingers   Cancer ([place]) 1991, 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 a-fib x 1 episode patient states he "auto corrected" seen at [facility]   GERD (gastroesophageal reflux disease)    Headache    poor posture - none recently   History of kidney stones    Hydrocephalus (HCC)    Hyperlipidemia    Neuropathy    PSVT (paroxysmal supraventricular tachycardia) ([place]) 2009   Controlled with "breathing process"   Renal stone 9/08, 6/15   Wears dentures    full upper   Past Surgical History:  Procedure Laterality Date   CARDIAC CATHETERIZATION  2008   [place], [place]   CATARACT EXTRACTION [facility] Left 07/27/2015   Procedure: CATARACT EXTRACTION PHACO AND INTRAOCULAR LENS PLACEMENT ([place]);  Surgeon: Leandrew Koyanagi, MD;  Location: [place];  Service: Ophthalmology;  Laterality: Left;  TORIC   CATARACT EXTRACTION W/PHACO Right 08/24/2015   Procedure: CATARACT EXTRACTION PHACO AND INTRAOCULAR LENS PLACEMENT (IOC);  Surgeon: Leandrew Koyanagi, MD;  Location: [place];  Service: Ophthalmology;  Laterality: Right;  TORIC   COLONOSCOPY WITH PROPOFOL N/A 08/10/2020   Procedure: COLONOSCOPY WITH PROPOFOL;  Surgeon: Toledo, Benay Pike, MD;  Location: [facility] ENDOSCOPY;   Service: Gastroenterology;  Laterality: N/A;   HEMORROIDECTOMY  2010   banding   IR ANGIO INTRA EXTRACRAN SEL COM CAROTID INNOMINATE BILAT MOD SED  10/30/2019   IR ANGIO VERTEBRAL SEL VERTEBRAL UNI R MOD SED  10/30/2019   IR RADIOLOGIST EVAL & MGMT  04/11/2020   IR US GUIDE VASC ACCESS RIGHT  10/30/2019   KIDNEY STONE SURGERY  9/08, 6/15   lithotripsy   LAPAROSCOPIC REVISION VENTRICULAR-PERITONEAL (V-P) SHUNT  01/08/2020   Procedure: LAPAROSCOPIC REVISION VENTRICULAR-PERITONEAL (V-P) SHUNT;  Surgeon: Consuella Lose, MD;  Location: [place];  Service: Neurosurgery;;   PROSTATE BIOPSY  2007   SKIN CANCER EXCISION     TOENAIL TRIMMING  11/2017   Dr. 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   Excessive daytime sleepiness 10/19/2019   Hip pain 07/15/2019   Medicare annual wellness visit, subsequent 02/06/2019   Memory changes 11/21/2018   Aortic atherosclerosis ([place]) 03/27/2018  PAD (peripheral artery disease) ([place]) 03/27/2018   Smoking history 03/27/2018   Chronic left shoulder pain 03/13/2018   History of systemic reaction to bee sting 01/28/2018   Prediabetes 01/28/2018   History of elevated PSA 01/28/2018   Fall with injury 05/22/2017   AAA (abdominal aortic aneurysm) ([place]) 05/22/2017   Frequent headaches 01/15/2017   Osteoarthritis 07/13/2016   Chronic foot pain 07/13/2016   Encounter for abdominal aortic aneurysm (AAA) screening 02/17/2016   Mixed hyperlipidemia 11/27/2015   Chronic back pain 11/27/2015   Chronic fatigue  11/27/2015   SVT (supraventricular tachycardia) ([place]) 09/16/2015   Paroxysmal atrial fibrillation ([place]) 09/16/2015   Frequent PVCs 09/16/2015   Esophageal reflux 09/16/2015   Preventative health care 09/16/2015    ONSET DATE: date of referral 12/12/2021  REFERRING DIAG: R41.89 (ICD-10-CM) - Cognitive impairment   PERTINENT HISTORY: Normal Pressure Hydrocephalus in a patient with cognitive impairment + minimal gait impairment + bladder issues + panic attacks 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 No typical spell was recorded during this study. No ictal or interictal epileptiform activity was noted.    Routine EEG 02/2021: This is an awake and sleep abnormal EEG due to presence of left temporal theta slowing was noted. No epileptiform discharges noted.   Dec 15, 2021 10:21 AM - audiology Right Score: 100% at 60dB HL Normal Symmetric No prior test for comparison Left Score: 100% at 60dB HL Normal Symmetric No prior test for comparison   Patient is not a hearing aid candidate at this time due to normal hearing from [phone] Hz.    THERAPY DIAG:  Cognitive communication deficit  Cognitive impairment  Rationale for Evaluation and Treatment Rehabilitation  SUBJECTIVE: pt voices concern with dementia diagnosis   Pt  accompanied by: significant other  PAIN:  Are you having pain? No  PATIENT GOALS: Pt states goal of improving memory of names, specifically names of  family members. His wife states goal of helping pt have more interest in life, interacting more with activities.   OBJECTIVE:   TODAY'S TREATMENT: Skilled treatment session focused on pt's cognition goals. SLP facilitated session by providing the following interventions:  Reviewed previous assessments, answered questions about current assessment with skilled goals written down for pt:  Goal - functional recall of medications   - goal is NOT for pt to study list of medications and be completely independent recall   - goal is for pt to be able to independently access the information for recall - such as a list in his wallet, app on his cellphone Pt seemed intrigued with using such strategies    PATIENT EDUCATION: Education details: functional goals Person educated: Patient and Spouse Education method: Explanation, Verbal cues, and Handouts Education comprehension: verbalized understanding and needs further education  GOALS: Goals reviewed with patient? Yes   SHORT TERM GOALS: Target date: 10 sessions   With moderate assistance, pt will use  strategies to improve memory for important information with 75% acc. with minimal assistance (ie., white board, daily planner/calendar, Apps on phone).  Baseline: 25% Goal status: INITIAL   2.  Given a functional problem (ie., social problem, multi-step math word problem, logic puzzle) or scenario from daily life, patient will demonstrate error awareness and correct errors independently with at least 75% acc with moderate assistance.  Baseline: unaware Goal status: INITIAL     LONG TERM GOALS: Target date: 03/26/2022 Given a functional problem a scenario from daily life, patient will demonstrate error awareness and correct errors independently with at least 90% accuracy with minimal assistance.   Baseline: unaware Goal status: INITIAL   2.  Given minimal assistance, pt will use strategies to improve memory for important information with 90% acc. (ie., white board, daily planner/calendar, Apps on phone).  Baseline: 25% Goal status: INITIAL  ASSESSMENT:  CLINICAL IMPRESSION: Pt voiced understanding of need to use compensatory strategies to facilitate recall of information vs independent recall.   OBJECTIVE IMPAIRMENTS include attention, memory, awareness, and executive functioning. These impairments are limiting patient from managing medications, managing appointments, household responsibilities, and ADLs/IADLs. Factors affecting potential to achieve goals and functional outcome are ability to learn/carryover information, co-morbidities, medical prognosis, and severity of impairments. Patient will benefit from skilled SLP services to address above impairments and improve overall function.  REHAB POTENTIAL: Good  PLAN: SLP FREQUENCY: 1-2x/week  SLP DURATION: 12 weeks  PLANNED INTERVENTIONS: Cognitive reorganization, Internal/external aids, Functional tasks, SLP instruction and feedback, Compensatory strategies, and Patient/family education   Adam Juarez B. Rutherford Nail, M.S., CCC-SLP, Mining engineer Certified Brain Injury [place]  [place] Office [phone] Ascom [phone] Fax [phone]

## 2022-01-31 ENCOUNTER — Ambulatory Visit: Payer: Medicare Other | Admitting: Speech Pathology

## 2022-01-31 DIAGNOSIS — R4189 Other symptoms and signs involving cognitive functions and awareness: Secondary | ICD-10-CM | POA: Diagnosis not present

## 2022-01-31 DIAGNOSIS — R41841 Cognitive communication deficit: Secondary | ICD-10-CM

## 2022-02-02 DIAGNOSIS — H353123 Nonexudative age-related macular degeneration, left eye, advanced atrophic without subfoveal involvement: Secondary | ICD-10-CM | POA: Diagnosis not present

## 2022-02-02 DIAGNOSIS — H353112 Nonexudative age-related macular degeneration, right eye, intermediate dry stage: Secondary | ICD-10-CM | POA: Diagnosis not present

## 2022-02-02 DIAGNOSIS — Z961 Presence of intraocular lens: Secondary | ICD-10-CM | POA: Diagnosis not present

## 2022-02-04 IMAGING — RF Imaging study
3 series · 3 of 3 positions shown · non-contrast
Comparison: none

CLINICAL DATA: 78-year-old male with history of normal pressure
hydrocephalus.

[Series 1: fluoro_iodine 2fps_bw · 0.17mm/px · 1 of 1 slices shown]
[im 1/1]
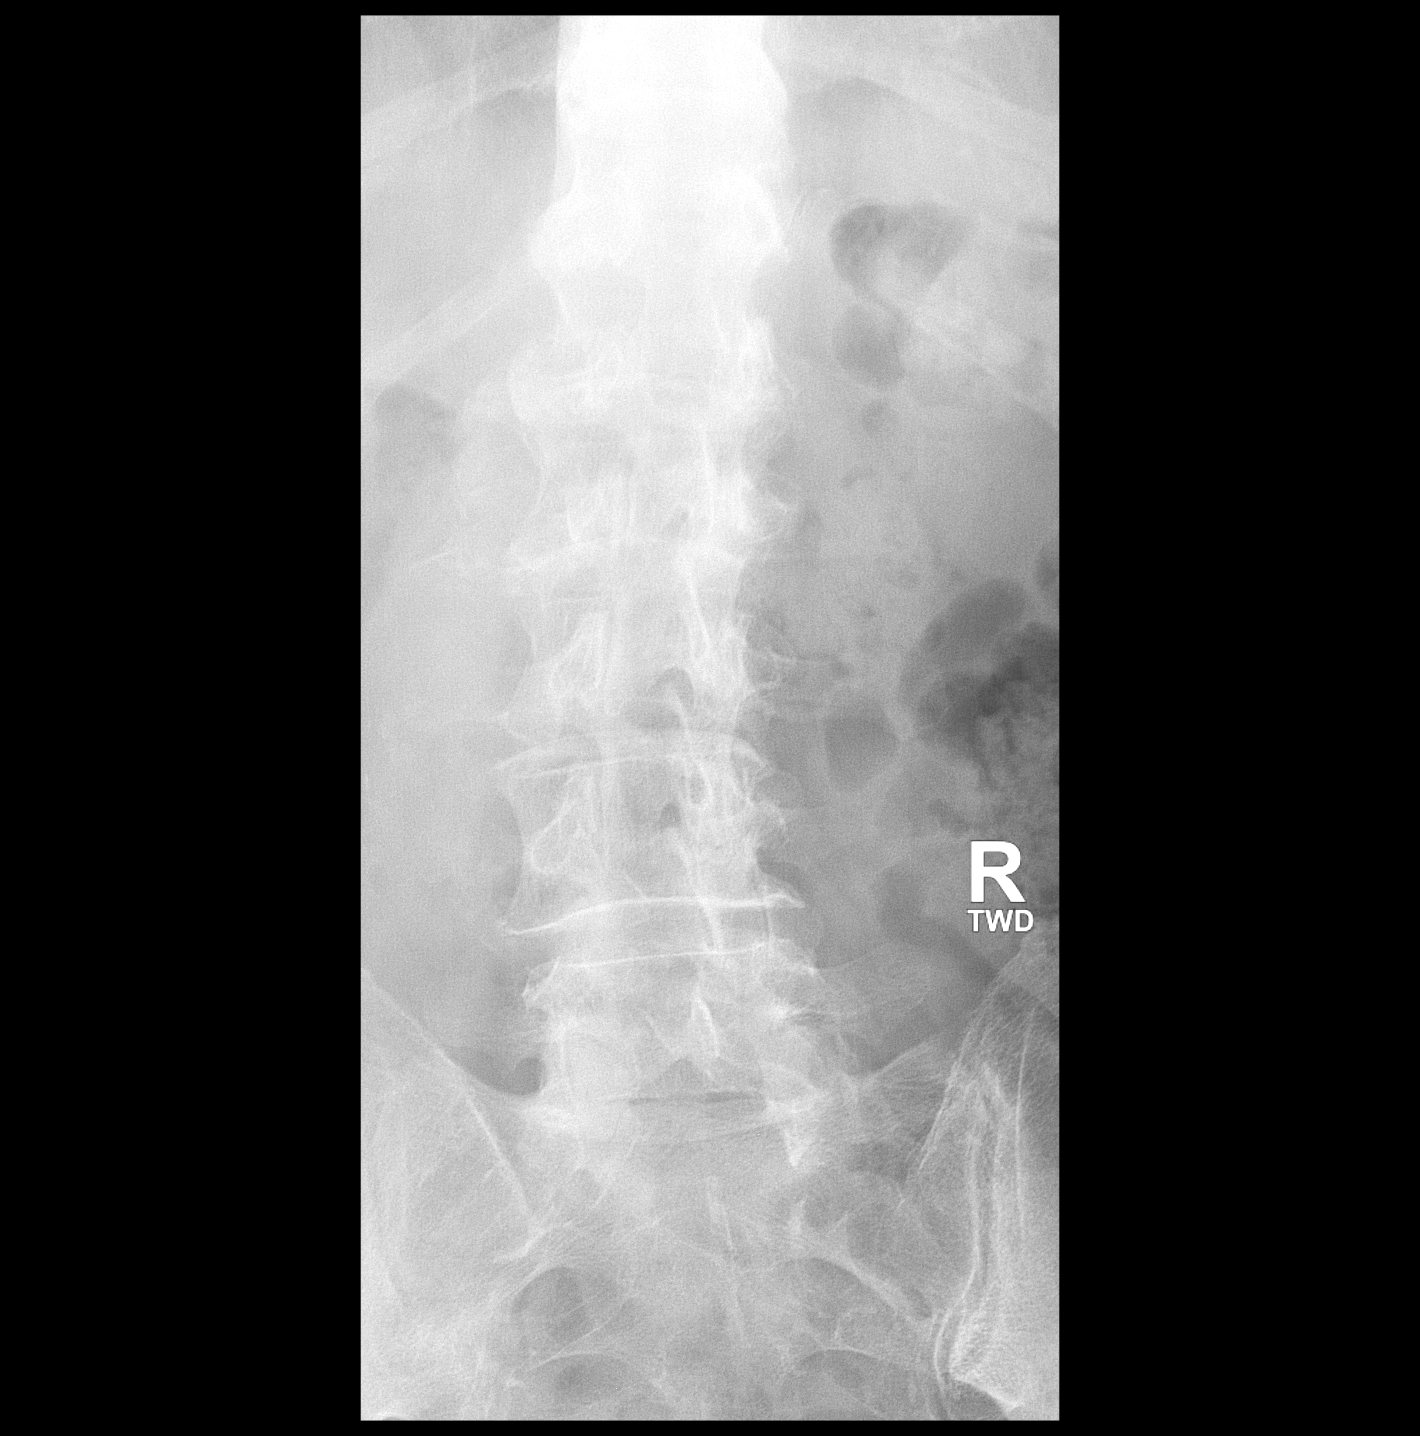

[Series 2: cp_standard · 0.25mm/px · 1 of 1 slices shown (1 of 2)]
[im 1/1]
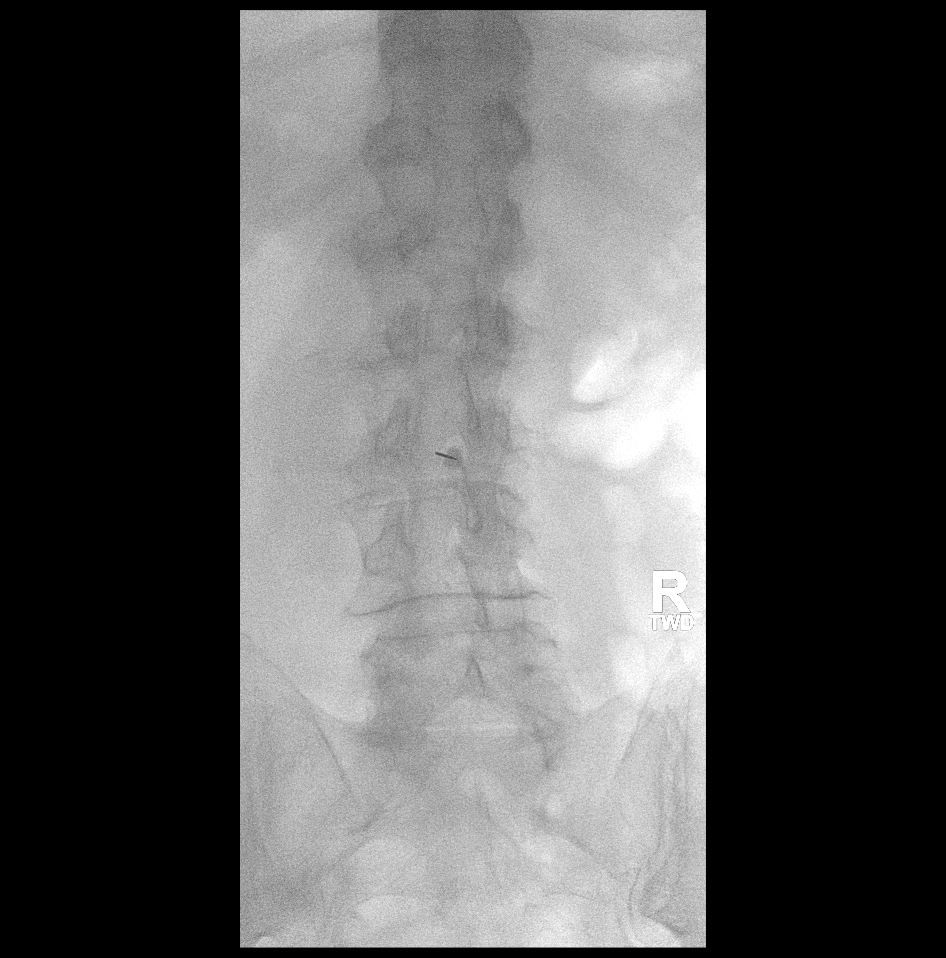

[Series 3: cp_standard · 0.25mm/px · 1 of 1 slices shown (2 of 2)]
[im 1/1]
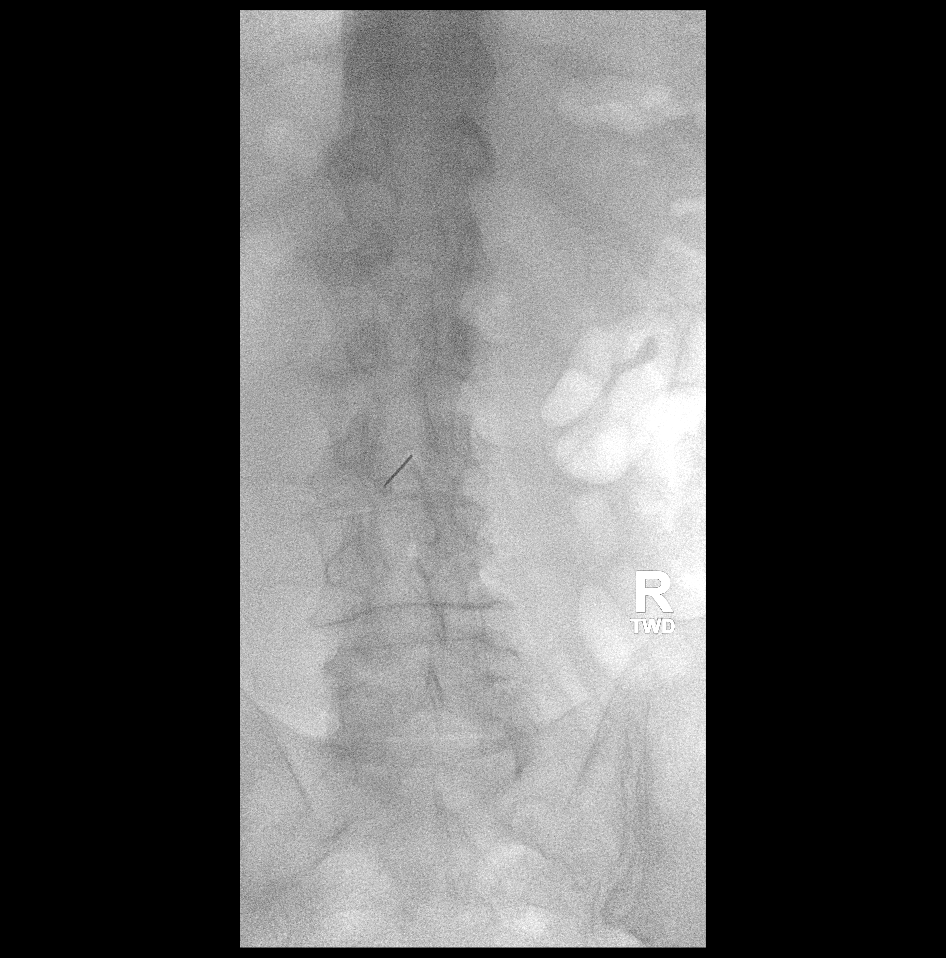

[3 of 3 positions shown; findings below may reference images not displayed]

EXAM:
DIAGNOSTIC LUMBAR PUNCTURE UNDER FLUOROSCOPIC GUIDANCE

FLUOROSCOPY TIME:  Fluoroscopy Time:  42 seconds

Radiation Exposure Index (if provided by the fluoroscopic device):
8.2 mGy

PROCEDURE:
Informed consent was obtained from the patient prior to the
procedure, including potential complications of headache, allergy,
and pain. With the patient prone, the lower back was prepped with
Betadine. 1% Lidocaine was used for local anesthesia. Lumbar
puncture was performed at the L2-L3 level using a 22 gauge needle
with return of clear CSF with an opening pressure of 19 cm water. 31
ml of CSF were obtained for laboratory studies. The patient
tolerated the procedure well and there were no apparent
complications.
IMPRESSION: 1. Successful uncomplicated fluoroscopic guided lumbar puncture, as
above.

## 2022-02-05 ENCOUNTER — Ambulatory Visit: Payer: Medicare Other | Admitting: Speech Pathology

## 2022-02-05 DIAGNOSIS — R4189 Other symptoms and signs involving cognitive functions and awareness: Secondary | ICD-10-CM

## 2022-02-05 DIAGNOSIS — R41841 Cognitive communication deficit: Secondary | ICD-10-CM

## 2022-02-05 NOTE — Therapy (Addendum)
OUTPATIENT SPEECH LANGUAGE PATHOLOGY TREATMENT NOTE 10th VISIT PROGRESS NOTE   Patient Name: Adam Juarez MRN: [id number] DOB:Mar 30, 1941, 81 y.o., male Today's Date: 02/05/2022  PCP: Alma Friendly, NP REFERRING PROVIDER: Jennings Books, MD  END OF SESSION:   End of Session - 02/05/22 0826     Visit Number 10    Number of Visits 25    Date for SLP Re-Evaluation 03/26/22    Authorization Type Medicare A/Medicare B/Tricare for Life    Progress Note Due on Visit 10    SLP Start Time 1300    SLP Stop Time  1400    SLP Time Calculation (min) 60 min    Activity Tolerance Patient tolerated treatment well             Past Medical History:  Diagnosis Date   Arthritis    fingers   Cancer ([place]) 1991, 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 a-fib x 1 episode patient states he "auto corrected" seen at [facility]   GERD (gastroesophageal reflux disease)    Headache    poor posture - none recently   History of kidney stones    Hydrocephalus (HCC)    Hyperlipidemia    Neuropathy    PSVT (paroxysmal supraventricular tachycardia) ([place]) 2009   Controlled with "breathing process"   Renal stone 9/08, 6/15   Wears dentures    full upper   Past Surgical History:  Procedure Laterality Date   CARDIAC CATHETERIZATION  2008   [place], [place]   CATARACT EXTRACTION [facility] Left 07/27/2015   Procedure: CATARACT EXTRACTION PHACO AND INTRAOCULAR LENS PLACEMENT ([place]);  Surgeon: Leandrew Koyanagi, MD;  Location: [place];  Service: Ophthalmology;  Laterality: Left;  TORIC   CATARACT EXTRACTION W/PHACO Right 08/24/2015   Procedure: CATARACT EXTRACTION PHACO AND INTRAOCULAR LENS PLACEMENT (IOC);  Surgeon: Leandrew Koyanagi, MD;  Location: [place];  Service: Ophthalmology;  Laterality: Right;  TORIC   COLONOSCOPY WITH PROPOFOL N/A 08/10/2020   Procedure: COLONOSCOPY WITH PROPOFOL;  Surgeon: Toledo, Benay Pike, MD;   Location: [facility] ENDOSCOPY;  Service: Gastroenterology;  Laterality: N/A;   HEMORROIDECTOMY  2010   banding   IR ANGIO INTRA EXTRACRAN SEL COM CAROTID INNOMINATE BILAT MOD SED  10/30/2019   IR ANGIO VERTEBRAL SEL VERTEBRAL UNI R MOD SED  10/30/2019   IR RADIOLOGIST EVAL & MGMT  04/11/2020   IR US GUIDE VASC ACCESS RIGHT  10/30/2019   KIDNEY STONE SURGERY  9/08, 6/15   lithotripsy   LAPAROSCOPIC REVISION VENTRICULAR-PERITONEAL (V-P) SHUNT  01/08/2020   Procedure: LAPAROSCOPIC REVISION VENTRICULAR-PERITONEAL (V-P) SHUNT;  Surgeon: Consuella Lose, MD;  Location: [place];  Service: Neurosurgery;;   PROSTATE BIOPSY  2007   SKIN CANCER EXCISION     TOENAIL TRIMMING  11/2017   Dr. 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   Excessive daytime sleepiness 10/19/2019   Hip pain 07/15/2019   Medicare annual wellness visit, subsequent 02/06/2019   Memory changes 11/21/2018   Aortic  atherosclerosis ([place]) 03/27/2018   PAD (peripheral artery disease) ([place]) 03/27/2018   Smoking history 03/27/2018   Chronic left shoulder pain 03/13/2018   History of systemic reaction to bee sting 01/28/2018   Prediabetes 01/28/2018   History of elevated PSA 01/28/2018   Fall with injury 05/22/2017   AAA (abdominal aortic aneurysm) ([place]) 05/22/2017   Frequent headaches 01/15/2017   Osteoarthritis 07/13/2016   Chronic foot pain 07/13/2016   Encounter for abdominal aortic aneurysm (AAA) screening 02/17/2016   Mixed hyperlipidemia 11/27/2015   Chronic back pain  11/27/2015   Chronic fatigue 11/27/2015   SVT (supraventricular tachycardia) ([place]) 09/16/2015   Paroxysmal atrial fibrillation ([place]) 09/16/2015   Frequent PVCs 09/16/2015   Esophageal reflux 09/16/2015   Preventative health care 09/16/2015    ONSET DATE: date of referral 12/12/2021  REFERRING DIAG: R41.89 (ICD-10-CM) - Cognitive impairment   PERTINENT HISTORY: Normal Pressure Hydrocephalus in a patient with cognitive impairment + minimal gait impairment + bladder issues + panic attacks 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 No typical spell was recorded during this study. No ictal or interictal epileptiform activity was noted.    Routine EEG 02/2021: This is an awake and sleep abnormal EEG due to presence of left temporal theta slowing was noted. No epileptiform discharges noted.   Dec 15, 2021 10:21 AM - audiology Right Score: 100% at 60dB HL Normal Symmetric No prior test for comparison Left Score: 100% at 60dB HL Normal Symmetric No prior test for comparison   Patient is not a hearing aid candidate at this time due to normal hearing from [phone] Hz.    THERAPY DIAG:  Cognitive communication deficit  Cognitive impairment  Rationale for Evaluation and Treatment Rehabilitation  SUBJECTIVE: pt voices concern  with dementia diagnosis   Pt accompanied by: significant other  PAIN:  Are you having pain? No  PATIENT GOALS: Pt states goal of improving memory of names, specifically names of  family members. His wife states goal of helping pt have more interest in life, interacting more with activities.   OBJECTIVE:   TODAY'S TREATMENT: Skilled treatment session focused on pt's cognition goals. SLP facilitated session by providing the following interventions:  Instruction provided in utilizing apps on cell phone to aid in recall of tasks: instruction provided in setting alarms as reminders to complete activities as well as using the LABEL feature to describe task to remember  Assistance provided in downloading MyChart and setting feature for Face ID    PATIENT EDUCATION: Education details: functional goals Person educated: Patient and Spouse Education method: Explanation, Verbal cues, and Handouts Education comprehension: verbalized understanding and needs further education  GOALS: Goals reviewed with patient? Yes   SHORT TERM GOALS: Target date: 10 sessions    With moderate assistance, pt will use strategies to improve memory for important information with 75% acc. with minimal assistance (ie., white board, daily planner/calendar, Apps on phone).  Baseline: 50% Goal status: ongoing    2.  Given a functional problem (ie., social problem, multi-step math word problem, logic puzzle) or scenario from daily life, patient will demonstrate error awareness and correct errors independently with at least 75% acc with moderate assistance.  Baseline: 50% with moderate assistance Goal status: ongoing     LONG TERM GOALS: Target date: 03/26/2022 Given a functional problem a scenario from daily life, patient will demonstrate error awareness and correct errors independently with at least 90% accuracy with minimal assistance.  Baseline: 50% with moderate assistance Goal status: ongoing   2.  Given  minimal assistance, pt will use strategies to improve memory for important information with 90% acc. (ie., white board, daily planner/calendar, Apps on phone).  Baseline: 50% Goal status: ongoing  ASSESSMENT:  CLINICAL IMPRESSION: Pt continues to use multiple notepads and isn't always able to find the notepads d/t disorganization as well as continued reluctance to using organization strategies. As such, pt's functional progress has been limited.   OBJECTIVE IMPAIRMENTS include attention, memory, awareness, and executive functioning. These impairments are limiting patient from managing medications, managing appointments, household responsibilities, and ADLs/IADLs. Factors affecting potential to achieve goals and functional outcome are ability to learn/carryover information, co-morbidities, medical prognosis, and severity of impairments. Patient will benefit from skilled SLP services to address above impairments and improve overall function.  REHAB POTENTIAL: Good  PLAN: SLP FREQUENCY: 1-2x/week  SLP DURATION: 12 weeks  PLANNED INTERVENTIONS: Cognitive reorganization, Internal/external aids, Functional tasks, SLP instruction and feedback, Compensatory strategies, and Patient/family education   Jada Fass B. Rutherford Nail, M.S., CCC-SLP, Mining engineer Certified Brain Injury [place]  [place] Office [phone] Ascom [phone] Fax [phone]

## 2022-02-05 NOTE — Therapy (Signed)
OUTPATIENT SPEECH LANGUAGE PATHOLOGY TREATMENT NOTE    Patient Name: Adam Juarez MRN: [id number] DOB:Apr 23, 1941, 81 y.o., male Today's Date: 02/05/2022  PCP: Alma Friendly, NP REFERRING PROVIDER: Jennings Books, MD  END OF SESSION:   End of Session - 02/05/22 1306     Visit Number 11    Number of Visits 25    Date for SLP Re-Evaluation 03/26/22    Authorization Type Medicare A/Medicare B/Tricare for Life    Progress Note Due on Visit 10    SLP Start Time 1300    SLP Stop Time  1415    SLP Time Calculation (min) 75 min    Activity Tolerance Patient tolerated treatment well             Past Medical History:  Diagnosis Date   Arthritis    fingers   Cancer ([place]) 1991, 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 a-fib x 1 episode patient states he "auto corrected" seen at [facility]   GERD (gastroesophageal reflux disease)    Headache    poor posture - none recently   History of kidney stones    Hydrocephalus (HCC)    Hyperlipidemia    Neuropathy    PSVT (paroxysmal supraventricular tachycardia) ([place]) 2009   Controlled with "breathing process"   Renal stone 9/08, 6/15   Wears dentures    full upper   Past Surgical History:  Procedure Laterality Date   CARDIAC CATHETERIZATION  2008   [place], [place]   CATARACT EXTRACTION [facility] Left 07/27/2015   Procedure: CATARACT EXTRACTION PHACO AND INTRAOCULAR LENS PLACEMENT ([place]);  Surgeon: Leandrew Koyanagi, MD;  Location: [place];  Service: Ophthalmology;  Laterality: Left;  TORIC   CATARACT EXTRACTION W/PHACO Right 08/24/2015   Procedure: CATARACT EXTRACTION PHACO AND INTRAOCULAR LENS PLACEMENT (IOC);  Surgeon: Leandrew Koyanagi, MD;  Location: [place];  Service: Ophthalmology;  Laterality: Right;  TORIC   COLONOSCOPY WITH PROPOFOL N/A 08/10/2020   Procedure: COLONOSCOPY WITH PROPOFOL;  Surgeon: Toledo, Benay Pike, MD;  Location: [facility];   Service: Gastroenterology;  Laterality: N/A;   HEMORROIDECTOMY  2010   banding   IR ANGIO INTRA EXTRACRAN SEL COM CAROTID INNOMINATE BILAT MOD SED  10/30/2019   IR ANGIO VERTEBRAL SEL VERTEBRAL UNI R MOD SED  10/30/2019   IR RADIOLOGIST EVAL & MGMT  04/11/2020   IR US GUIDE VASC ACCESS RIGHT  10/30/2019   KIDNEY STONE SURGERY  9/08, 6/15   lithotripsy   LAPAROSCOPIC REVISION VENTRICULAR-PERITONEAL (V-P) SHUNT  01/08/2020   Procedure: LAPAROSCOPIC REVISION VENTRICULAR-PERITONEAL (V-P) SHUNT;  Surgeon: Consuella Lose, MD;  Location: [place];  Service: Neurosurgery;;   PROSTATE BIOPSY  2007   SKIN CANCER EXCISION     TOENAIL TRIMMING  11/2017   Dr. 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   Excessive daytime sleepiness 10/19/2019   Hip pain 07/15/2019   Medicare annual wellness visit, subsequent 02/06/2019   Memory changes 11/21/2018   Aortic atherosclerosis ([place]) 03/27/2018  PAD (peripheral artery disease) ([place]) 03/27/2018   Smoking history 03/27/2018   Chronic left shoulder pain 03/13/2018   History of systemic reaction to bee sting 01/28/2018   Prediabetes 01/28/2018   History of elevated PSA 01/28/2018   Fall with injury 05/22/2017   AAA (abdominal aortic aneurysm) ([place]) 05/22/2017   Frequent headaches 01/15/2017   Osteoarthritis 07/13/2016   Chronic foot pain 07/13/2016   Encounter for abdominal aortic aneurysm (AAA) screening 02/17/2016   Mixed hyperlipidemia 11/27/2015   Chronic back pain 11/27/2015   Chronic fatigue  11/27/2015   SVT (supraventricular tachycardia) ([place]) 09/16/2015   Paroxysmal atrial fibrillation ([place]) 09/16/2015   Frequent PVCs 09/16/2015   Esophageal reflux 09/16/2015   Preventative health care 09/16/2015    ONSET DATE: date of referral 12/12/2021  REFERRING DIAG: R41.89 (ICD-10-CM) - Cognitive impairment   PERTINENT HISTORY: Normal Pressure Hydrocephalus in a patient with cognitive impairment + minimal gait impairment + bladder issues + panic attacks 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 No typical spell was recorded during this study. No ictal or interictal epileptiform activity was noted.    Routine EEG 02/2021: This is an awake and sleep abnormal EEG due to presence of left temporal theta slowing was noted. No epileptiform discharges noted.   Dec 15, 2021 10:21 AM - audiology Right Score: 100% at 60dB HL Normal Symmetric No prior test for comparison Left Score: 100% at 60dB HL Normal Symmetric No prior test for comparison   Patient is not a hearing aid candidate at this time due to normal hearing from [id number] Hz.    THERAPY DIAG:  Cognitive communication deficit  Cognitive impairment  Rationale for Evaluation and Treatment Rehabilitation  SUBJECTIVE: Pt states "about the homework" he voices that it  wasn't a priority - she wife further adds, he turned off his reminder alarm and didn't do the task  Pt accompanied by: significant other  PAIN:  Are you having pain? No  PATIENT GOALS: Pt states goal of improving memory of names, specifically names of  family members. His wife states goal of helping pt have more interest in life, interacting more with activities.   OBJECTIVE:   TODAY'S TREATMENT: Skilled treatment session focused on pt's cognition goals. 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 He demonstrates the cognitive ability to successfully set alarms as reminders and the ability to write down notes but he "isn't convinced that he needs too."   Extensive education and teaching provided but pt's wife reports mal-adaptive behavior (since retirement 18 years ago) of becoming frustrated and ceasing tasks as a response. This in combination of memory deficits make it difficult for SLP to impact/motivate pt.     PATIENT EDUCATION: Education details: functional goals Person educated: Patient and Spouse Education method: Explanation, Verbal cues, and Handouts Education  comprehension: verbalized understanding and needs further education  GOALS: Goals reviewed with patient? Yes   SHORT TERM GOALS: Target date: 10 sessions    With moderate assistance, pt will use strategies to improve memory for important information with 75% acc. with minimal assistance (ie., white board, daily planner/calendar, Apps on phone).  Baseline: 50% Goal status: ongoing    2.  Given a functional problem (ie., social problem, multi-step math word problem, logic puzzle) or scenario from daily life, patient will demonstrate error awareness and correct errors independently with at least 75% acc with moderate assistance.  Baseline: 50% with moderate assistance Goal status: ongoing      LONG TERM GOALS: Target date: 03/26/2022 Given a functional problem a scenario from daily life, patient will demonstrate error awareness and correct errors independently with at least 90% accuracy with minimal assistance.  Baseline: 50% with moderate assistance Goal status: ongoing   2.  Given minimal assistance, pt will use strategies to improve memory for important information with 90% acc. (ie., white board, daily planner/calendar, Apps on phone).  Baseline: 50% Goal status: ongoing  ASSESSMENT:  CLINICAL IMPRESSION: Pt continues to mild cognitive impairment with specifically severe memory impairments. This results in overall poor functional ability and reliance on his wife.   In discussion with pt and his wife, would recommend pt cease driving and participate in a formal driving assessment.   Will attempt further ST x 1 session.   OBJECTIVE IMPAIRMENTS include attention, memory, awareness, and executive functioning. These impairments are limiting patient from managing medications, managing appointments, household responsibilities, and ADLs/IADLs. Factors affecting potential to achieve goals and functional outcome are ability to learn/carryover information, co-morbidities, medical prognosis, and severity of impairments. Patient will benefit from skilled SLP services to address above impairments and improve overall function.  REHAB POTENTIAL: Good  PLAN: SLP FREQUENCY: 1-2x/week  SLP DURATION: 12 weeks  PLANNED INTERVENTIONS: Cognitive reorganization, Internal/external aids, Functional tasks, SLP instruction and feedback, Compensatory strategies, and Patient/family education   Imonie Tuch B. Rutherford Nail, M.S., CCC-SLP, Mining engineer Certified Brain Injury [place]  [place] Office [phone] Ascom [phone] Fax [phone]

## 2022-02-07 ENCOUNTER — Ambulatory Visit: Payer: Medicare Other | Admitting: Speech Pathology

## 2022-02-07 DIAGNOSIS — R41841 Cognitive communication deficit: Secondary | ICD-10-CM | POA: Diagnosis not present

## 2022-02-07 DIAGNOSIS — R4189 Other symptoms and signs involving cognitive functions and awareness: Secondary | ICD-10-CM

## 2022-02-07 NOTE — Therapy (Unsigned)
OUTPATIENT SPEECH LANGUAGE PATHOLOGY TREATMENT NOTE DISCHARGE SUMMARY    Patient Name: Adam Juarez MRN: [id number] DOB:[birth year], 81 y.o., male Today's Date: 02/07/2022  PCP: Alma Friendly, NP REFERRING PROVIDER: Jennings Books, MD  END OF SESSION:   End of Session - 02/07/22 1607     Visit Number 12    Number of Visits 25    Date for SLP Re-Evaluation 03/26/22    Authorization Type Medicare A/Medicare B/Tricare for Life    Progress Note Due on Visit 10    SLP Start Time 1100    SLP Stop Time  1210    SLP Time Calculation (min) 70 min    Activity Tolerance Patient tolerated treatment well             Past Medical History:  Diagnosis Date   Arthritis    fingers   Cancer ([place]) 1991, 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 a-fib x 1 episode patient states he "auto corrected" seen at [facility]   GERD (gastroesophageal reflux disease)    Headache    poor posture - none recently   History of kidney stones    Hydrocephalus (HCC)    Hyperlipidemia    Neuropathy    PSVT (paroxysmal supraventricular tachycardia) ([place]) 2009   Controlled with "breathing process"   Renal stone 9/08, 6/15   Wears dentures    full upper   Past Surgical History:  Procedure Laterality Date   CARDIAC CATHETERIZATION  2008   [place], [place]   CATARACT EXTRACTION [facility] Left 07/27/2015   Procedure: CATARACT EXTRACTION PHACO AND INTRAOCULAR LENS PLACEMENT ([place]);  Surgeon: Leandrew Koyanagi, MD;  Location: [place];  Service: Ophthalmology;  Laterality: Left;  TORIC   CATARACT EXTRACTION W/PHACO Right 08/24/2015   Procedure: CATARACT EXTRACTION PHACO AND INTRAOCULAR LENS PLACEMENT (IOC);  Surgeon: Leandrew Koyanagi, MD;  Location: [place];  Service: Ophthalmology;  Laterality: Right;  TORIC   COLONOSCOPY WITH PROPOFOL N/A 08/10/2020   Procedure: COLONOSCOPY WITH PROPOFOL;  Surgeon: Toledo, Benay Pike, MD;  Location:  [facility];  Service: Gastroenterology;  Laterality: N/A;   HEMORROIDECTOMY  2010   banding   IR ANGIO INTRA EXTRACRAN SEL COM CAROTID INNOMINATE BILAT MOD SED  10/30/2019   IR ANGIO VERTEBRAL SEL VERTEBRAL UNI R MOD SED  10/30/2019   IR RADIOLOGIST EVAL & MGMT  04/11/2020   IR US GUIDE VASC ACCESS RIGHT  10/30/2019   KIDNEY STONE SURGERY  9/08, 6/15   lithotripsy   LAPAROSCOPIC REVISION VENTRICULAR-PERITONEAL (V-P) SHUNT  01/08/2020   Procedure: LAPAROSCOPIC REVISION VENTRICULAR-PERITONEAL (V-P) SHUNT;  Surgeon: Consuella Lose, MD;  Location: [place];  Service: Neurosurgery;;   PROSTATE BIOPSY  2007   SKIN CANCER EXCISION     TOENAIL TRIMMING  11/2017   Dr. 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   Excessive daytime sleepiness 10/19/2019   Hip pain 07/15/2019   Medicare annual wellness visit, subsequent 02/06/2019   Memory changes 11/21/2018   Aortic atherosclerosis (  [place]) 03/27/2018   PAD (peripheral artery disease) ([place]) 03/27/2018   Smoking history 03/27/2018   Chronic left shoulder pain 03/13/2018   History of systemic reaction to bee sting 01/28/2018   Prediabetes 01/28/2018   History of elevated PSA 01/28/2018   Fall with injury 05/22/2017   AAA (abdominal aortic aneurysm) ([place]) 05/22/2017   Frequent headaches 01/15/2017   Osteoarthritis 07/13/2016   Chronic foot pain 07/13/2016   Encounter for abdominal aortic aneurysm (AAA) screening 02/17/2016   Mixed hyperlipidemia 11/27/2015   Chronic back pain 11/27/2015    Chronic fatigue 11/27/2015   SVT (supraventricular tachycardia) ([place]) 09/16/2015   Paroxysmal atrial fibrillation ([place]) 09/16/2015   Frequent PVCs 09/16/2015   Esophageal reflux 09/16/2015   Preventative health care 09/16/2015    ONSET DATE: date of referral 12/12/2021  REFERRING DIAG: R41.89 (ICD-10-CM) - Cognitive impairment   PERTINENT HISTORY: Normal Pressure Hydrocephalus in a patient with cognitive impairment + minimal gait impairment + bladder issues + panic attacks 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 No typical spell was recorded during this study. No ictal or interictal epileptiform activity was noted.    Routine EEG 02/2021: This is an awake and sleep abnormal EEG due to presence of left temporal theta slowing was noted. No epileptiform discharges noted.   Dec 15, 2021 10:21 AM - audiology Right Score: 100% at 60dB HL Normal Symmetric No prior test for comparison Left Score: 100% at 60dB HL Normal Symmetric No prior test for comparison   Patient is not a hearing aid candidate at this time due to normal hearing from [phone] Hz.    THERAPY DIAG:  Cognitive communication deficit  Cognitive impairment  Rationale for Evaluation and Treatment Rehabilitation  SUBJECTIVE: Pt with notebook and pen in his  shirt pocket, readily takes out to write down notes  Pt accompanied by: significant other  PAIN:  Are you having pain? No  PATIENT GOALS: Pt states goal of improving memory of names, specifically names of  family members. His wife states goal of helping pt have more interest in life, interacting more with activities.   OBJECTIVE:   TODAY'S TREATMENT: Skilled treatment session focused on pt's cognition goals. SLP facilitated session by providing the following interventions:  Pt was asked to complete the Mutli-factorial Memory Questionnaire as a therapuetic task to provide direct feedback on ways to improve the way pt feels about his memory, memory mistakes and provide potential ideas on how to use more memory strategies to increase functional independence. Pt required more than a reasonable amount of time to understand questions and the responses. Pt's wife was helpful in discussing items with him. Pt's responses frequently didn't appear to make information/concerns in previous sessions. As such, feeling out the [facility] did not accomplish the afore mentioned goals.   SLP further facilitated session by having pt provide list of pros/cons when choosing not to participate in tasks (as mentioned in  previous notes). Pt was not able to complete d/t significant memory deficits and significant difficulty in understanding task. Moderate cues provide for re-direction to task. With increased cues, pt's frustration tolerance decreased.     PATIENT EDUCATION: Education details: decreased task tolerance, significant memory deficits, decreased mental flexibility , agnosia Person educated: Patient and Spouse Education method: Explanation, Verbal cues, and Handouts Education comprehension: verbalized understanding and needs further education  GOALS: Goals reviewed with patient? Yes   SHORT TERM GOALS: Target date: 10 sessions    With moderate assistance, pt will use strategies to improve memory for important  information with 75% acc. with minimal assistance (ie., white board, daily planner/calendar, Apps on phone).  Baseline: 50% Goal status: NOT MET    2.  Given a functional problem (ie., social problem, multi-step math word problem, logic puzzle) or scenario from daily life, patient will demonstrate error awareness and correct errors independently with at least 75% acc with moderate assistance.  Baseline: 50% with moderate assistance Goal status: NOT MET     LONG TERM GOALS: Target date: 03/26/2022 Given a functional problem a scenario from daily life, patient will demonstrate error awareness and correct errors independently with at least 90% accuracy with minimal assistance.  Baseline: 50% with moderate assistance Goal status: NOT MET   2.  Given minimal assistance, pt will use strategies to improve memory for important information with 90% acc. (ie., white board, daily planner/calendar, Apps on phone).  Baseline: 50% Goal status: NOT MET  ASSESSMENT:  CLINICAL IMPRESSION: At this time, pt is no longer a candidate for skilled ST intervention as it is not making any functional difference in pt's life d/t continued emotional response to mild dementia dx, decreased mental flexibility and task tolerance, refusal to perform tasks as well severity of deficits. Information provided on several support groups to help pt and his wife through this process.     Temeka Pore B. Rutherford Nail, M.S., CCC-SLP, Mining engineer Certified Brain Injury [place]  [place] Office [phone] Ascom [phone] Fax [phone]

## 2022-02-08 ENCOUNTER — Telehealth: Payer: Self-pay | Admitting: *Deleted

## 2022-02-08 NOTE — Patient Outreach (Signed)
  Care Coordination   Follow Up Visit Note   02/08/2022 Name: Adam Juarez MRN: [id number] DOB: 26-Jun-1940  Adam Juarez is a 81 y.o. year old male who sees Pleas Koch, NP for primary care. I spoke with  Charlean Merl wife by phone today.  What matters to the patients health and wellness today?  Manage his Atrial Fib.    Goals Addressed             This Visit's Progress    Develop Plan of care for Management of Afib          SDOH assessments and interventions completed:  Yes     Care Coordination Interventions Activated:  Yes  Care Coordination Interventions:  Yes, provided   Follow up plan: Follow up call scheduled for Adam Juarez [phone]     Encounter Outcome:  Pt. Visit Completed ;  [place] [facility] [phone]

## 2022-02-12 ENCOUNTER — Ambulatory Visit: Payer: Medicare Other | Admitting: Speech Pathology

## 2022-02-14 ENCOUNTER — Ambulatory Visit: Payer: Medicare Other | Admitting: Speech Pathology

## 2022-02-14 DIAGNOSIS — Z03818 Encounter for observation for suspected exposure to other biological agents ruled out: Secondary | ICD-10-CM | POA: Diagnosis not present

## 2022-02-15 DIAGNOSIS — D0461 Carcinoma in situ of skin of right upper limb, including shoulder: Secondary | ICD-10-CM | POA: Diagnosis not present

## 2022-02-19 ENCOUNTER — Encounter: Payer: Medicare Other | Admitting: Speech Pathology

## 2022-02-20 ENCOUNTER — Encounter: Payer: Medicare Other | Admitting: Speech Pathology

## 2022-02-21 ENCOUNTER — Encounter: Payer: Medicare Other | Admitting: Speech Pathology

## 2022-02-22 DIAGNOSIS — D0439 Carcinoma in situ of skin of other parts of face: Secondary | ICD-10-CM | POA: Diagnosis not present

## 2022-02-26 ENCOUNTER — Encounter: Payer: Medicare Other | Admitting: Speech Pathology

## 2022-02-26 DIAGNOSIS — H353124 Nonexudative age-related macular degeneration, left eye, advanced atrophic with subfoveal involvement: Secondary | ICD-10-CM | POA: Diagnosis not present

## 2022-02-27 DIAGNOSIS — Z982 Presence of cerebrospinal fluid drainage device: Secondary | ICD-10-CM | POA: Diagnosis not present

## 2022-02-27 DIAGNOSIS — G912 (Idiopathic) normal pressure hydrocephalus: Secondary | ICD-10-CM | POA: Diagnosis not present

## 2022-02-27 DIAGNOSIS — I480 Paroxysmal atrial fibrillation: Secondary | ICD-10-CM | POA: Diagnosis not present

## 2022-02-27 DIAGNOSIS — R2689 Other abnormalities of gait and mobility: Secondary | ICD-10-CM | POA: Diagnosis not present

## 2022-02-27 DIAGNOSIS — F419 Anxiety disorder, unspecified: Secondary | ICD-10-CM | POA: Diagnosis not present

## 2022-02-27 DIAGNOSIS — R4189 Other symptoms and signs involving cognitive functions and awareness: Secondary | ICD-10-CM | POA: Diagnosis not present

## 2022-02-27 DIAGNOSIS — Z8679 Personal history of other diseases of the circulatory system: Secondary | ICD-10-CM | POA: Diagnosis not present

## 2022-02-27 DIAGNOSIS — R4689 Other symptoms and signs involving appearance and behavior: Secondary | ICD-10-CM | POA: Diagnosis not present

## 2022-02-28 ENCOUNTER — Encounter: Payer: Medicare Other | Admitting: Speech Pathology

## 2022-03-01 ENCOUNTER — Encounter: Payer: Medicare Other | Admitting: Speech Pathology

## 2022-03-01 ENCOUNTER — Ambulatory Visit: Payer: Self-pay

## 2022-03-01 DIAGNOSIS — X32XXXA Exposure to sunlight, initial encounter: Secondary | ICD-10-CM | POA: Diagnosis not present

## 2022-03-01 DIAGNOSIS — L570 Actinic keratosis: Secondary | ICD-10-CM | POA: Diagnosis not present

## 2022-03-01 NOTE — Patient Instructions (Signed)
Visit Information  Thank you for taking time to visit with me today. Please don't hesitate to contact me if I can be of assistance to you.   Following are the goals we discussed today:   Goals Addressed             This Visit's Progress    Patient stated:  Management of Afib       Care Coordination Interventions: Counseled on increased risk of stroke due to Afib and benefits of anticoagulation for stroke prevention Assessed for signs/ symptoms of atrial fibrillation Reviewed scheduled/ upcoming provider appointments Reviewed medications and discussed importance of compliance Advised to monitor blood pressure at least 2-3 times per week.            Our next appointment is by telephone on 04/12/22  at 10:30 am  Please call the care guide team at [phone] if you need to cancel or reschedule your appointment.   If you are experiencing a Mental Health or [place] or need someone to talk to, please call 1-800-273-TALK (toll free, 24 hour hotline)  Patient verbalizes understanding of instructions and care plan provided today and agrees to view in [place]. Active MyChart status and patient understanding of how to access instructions and care plan via MyChart confirmed with patient.     Quinn Plowman RN,BSN,CCM [facility] Care Coordination [phone] direct line   Atrial Fibrillation  Atrial fibrillation is a type of heartbeat that is irregular or fast. If you have this condition, your heart beats without any order. This makes it hard for your heart to pump blood in a normal way. Atrial fibrillation may come and go, or it may become a long-lasting problem. If this condition is not treated, it can put you at higher risk for stroke, heart failure, and other heart problems. What are the causes? This condition may be caused by diseases that damage the heart. They include: High blood pressure. Heart failure. Heart valve disease. Heart surgery. Other causes  include: Diabetes. Thyroid disease. Being overweight. Kidney disease. Sometimes the cause is not known. What increases the risk? You are more likely to develop this condition if: You are older. You smoke. You exercise often and very hard. You have a family history of this condition. You are a man. You use drugs. You drink a lot of alcohol. You have lung conditions, such as emphysema, pneumonia, or COPD. You have sleep apnea. What are the signs or symptoms? Common symptoms of this condition include: A feeling that your heart is beating very fast. Chest pain or discomfort. Feeling short of breath. Suddenly feeling light-headed or weak. Getting tired easily during activity. Fainting. Sweating. In some cases, there are no symptoms. How is this treated? Treatment for this condition depends on underlying conditions and how you feel when you have atrial fibrillation. They include: Medicines to: Prevent blood clots. Treat heart rate or heart rhythm problems. Using devices, such as a pacemaker, to correct heart rhythm problems. Doing surgery to remove the part of the heart that sends bad signals. Closing an area where clots can form in the heart (left atrial appendage). In some cases, your doctor will treat other underlying conditions. Follow these instructions at home: Medicines Take over-the-counter and prescription medicines only as told by your doctor. Do not take any new medicines without first talking to your doctor. If you are taking blood thinners: Talk with your doctor before you take any medicines that have aspirin or NSAIDs, such as ibuprofen, in them. Take  your medicine exactly as told by your doctor. Take it at the same time each day. Avoid activities that could hurt or bruise you. Follow instructions about how to prevent falls. Wear a bracelet that says you are taking blood thinners. Or, carry a card that lists what medicines you take. Lifestyle     Do not use  any products that have nicotine or tobacco in them. These include cigarettes, e-cigarettes, and chewing tobacco. If you need help quitting, ask your doctor. Eat heart-healthy foods. Talk with your doctor about the right eating plan for you. Exercise regularly as told by your doctor. Do not drink alcohol. Lose weight if you are overweight. Do not use drugs, including cannabis. General instructions If you have a condition that causes breathing to stop for a short period of time (apnea), treat it as told by your doctor. Keep a healthy weight. Do not use diet pills unless your doctor says they are safe for you. Diet pills may make heart problems worse. Keep all follow-up visits as told by your doctor. This is important. Contact a doctor if: You notice a change in the speed, rhythm, or strength of your heartbeat. You are taking a blood-thinning medicine and you get more bruising. You get tired more easily when you move or exercise. You have a sudden change in weight. Get help right away if:  You have pain in your chest or your belly (abdomen). You have trouble breathing. You have side effects of blood thinners, such as blood in your vomit, poop (stool), or pee (urine), or bleeding that cannot stop. You have any signs of a stroke. "BE FAST" is an easy way to remember the main warning signs: B - Balance. Signs are dizziness, sudden trouble walking, or loss of balance. E - Eyes. Signs are trouble seeing or a change in how you see. F - Face. Signs are sudden weakness or loss of feeling in the face, or the face or eyelid drooping on one side. A - Arms. Signs are weakness or loss of feeling in an arm. This happens suddenly and usually on one side of the body. S - Speech. Signs are sudden trouble speaking, slurred speech, or trouble understanding what people say. T - Time. Time to call emergency services. Write down what time symptoms started. You have other signs of a stroke, such as: A sudden, very  bad headache with no known cause. Feeling like you may vomit (nausea). Vomiting. A seizure. These symptoms may be an emergency. Do not wait to see if the symptoms will go away. Get medical help right away. Call your local emergency services (911 in the [place] Do not drive yourself to the hospital. Summary Atrial fibrillation is a type of heartbeat that is irregular or fast. You are at higher risk of this condition if you smoke, are older, have diabetes, or are overweight. Follow your doctor's instructions about medicines, diet, exercise, and follow-up visits. Get help right away if you have signs or symptoms of a stroke. Get help right away if you cannot catch your breath, or you have chest pain or discomfort. This information is not intended to replace advice given to you by your health care provider. Make sure you discuss any questions you have with your health care provider. Document Revised: 10/22/2018 Document Reviewed: 10/22/2018 Elsevier Patient Education  [employer].

## 2022-03-01 NOTE — Patient Outreach (Addendum)
  Care Coordination   Follow Up Visit Note   03/01/2022 Name: Adam Juarez MRN: [id number] DOB: [birth year]  Adam Juarez is a 81 y.o. year old male who sees Pleas Koch, NP for primary care. I spoke with  [name] by phone today.  What matters to the patients health and wellness today?  Patient reports having atrial fibrillation approximately 7 years.  He states his symptoms are less noticeable.  Denies any recent symptoms.  Patient states he uses the app on his phone to check if he starts having atrial fibrillation symptoms.   Patient reports seeing his neurologist on 02/27/22 and starting on new medication Namenda.    Goals Addressed             This Visit's Progress    Patient stated:  Management of Afib       Care Coordination Interventions: Counseled on increased risk of stroke due to Afib and benefits of anticoagulation for stroke prevention Assessed for signs/ symptoms of atrial fibrillation Reviewed scheduled/ upcoming provider appointments Reviewed medications and discussed importance of compliance Advised to monitor blood pressure at least 2-3 times per week.  Education article sent to patient in [place] on Atrial fibrillation            SDOH assessments and interventions completed:  No     Care Coordination Interventions Activated:  Yes  Care Coordination Interventions:  Yes, provided   Follow up plan: Follow up call scheduled for 04/12/22 at 10:30 am    Encounter Outcome:  Pt. Visit Completed   Quinn Plowman RN,BSN,CCM [place] [phone] direct line

## 2022-03-05 ENCOUNTER — Encounter: Payer: Medicare Other | Admitting: Speech Pathology

## 2022-03-07 ENCOUNTER — Encounter: Payer: Medicare Other | Admitting: Speech Pathology

## 2022-03-12 ENCOUNTER — Encounter (INDEPENDENT_AMBULATORY_CARE_PROVIDER_SITE_OTHER): Payer: Self-pay

## 2022-03-12 ENCOUNTER — Other Ambulatory Visit: Payer: Self-pay | Admitting: Primary Care

## 2022-03-12 ENCOUNTER — Encounter: Payer: Medicare Other | Admitting: Speech Pathology

## 2022-03-12 DIAGNOSIS — K219 Gastro-esophageal reflux disease without esophagitis: Secondary | ICD-10-CM

## 2022-03-12 MED ORDER — OMEPRAZOLE 20 MG PO CPDR: 20.0000 mg | DELAYED_RELEASE_CAPSULE | Freq: Two times a day (BID) | ORAL | 2 refills | Status: DC

## 2022-03-14 ENCOUNTER — Encounter: Payer: Medicare Other | Admitting: Speech Pathology

## 2022-03-19 DIAGNOSIS — B351 Tinea unguium: Secondary | ICD-10-CM | POA: Diagnosis not present

## 2022-03-19 DIAGNOSIS — M545 Low back pain, unspecified: Secondary | ICD-10-CM | POA: Diagnosis not present

## 2022-03-19 DIAGNOSIS — G8929 Other chronic pain: Secondary | ICD-10-CM | POA: Diagnosis not present

## 2022-03-19 DIAGNOSIS — M216X1 Other acquired deformities of right foot: Secondary | ICD-10-CM | POA: Diagnosis not present

## 2022-03-19 DIAGNOSIS — M216X2 Other acquired deformities of left foot: Secondary | ICD-10-CM | POA: Diagnosis not present

## 2022-03-19 DIAGNOSIS — M2041 Other hammer toe(s) (acquired), right foot: Secondary | ICD-10-CM | POA: Diagnosis not present

## 2022-03-19 DIAGNOSIS — M792 Neuralgia and neuritis, unspecified: Secondary | ICD-10-CM | POA: Diagnosis not present

## 2022-03-19 DIAGNOSIS — M79675 Pain in left toe(s): Secondary | ICD-10-CM | POA: Diagnosis not present

## 2022-03-19 DIAGNOSIS — M216X9 Other acquired deformities of unspecified foot: Secondary | ICD-10-CM | POA: Diagnosis not present

## 2022-03-19 DIAGNOSIS — M2042 Other hammer toe(s) (acquired), left foot: Secondary | ICD-10-CM | POA: Diagnosis not present

## 2022-03-19 DIAGNOSIS — M79674 Pain in right toe(s): Secondary | ICD-10-CM | POA: Diagnosis not present

## 2022-03-19 DIAGNOSIS — L909 Atrophic disorder of skin, unspecified: Secondary | ICD-10-CM | POA: Diagnosis not present

## 2022-03-23 DIAGNOSIS — Z23 Encounter for immunization: Secondary | ICD-10-CM | POA: Diagnosis not present

## 2022-04-11 DIAGNOSIS — H903 Sensorineural hearing loss, bilateral: Secondary | ICD-10-CM | POA: Diagnosis not present

## 2022-04-11 DIAGNOSIS — R42 Dizziness and giddiness: Secondary | ICD-10-CM | POA: Diagnosis not present

## 2022-04-12 ENCOUNTER — Ambulatory Visit: Payer: Self-pay

## 2022-04-12 NOTE — Patient Outreach (Signed)
  Care Coordination   Follow Up Visit Note   04/12/2022 Name: Zayne Draheim MRN: [id number] DOB: [birth year]  Huntley Knoop is a 81 y.o. year old male who sees Pleas Koch, NP for primary care. I  spoke with patients wife, Lewayne Pauley  What matters to the patients health and wellness today?  Wife states patient is doing well.  She denies patient reporting any break through symptoms of A-fib.  Wife states patient is having frequent issues with acid reflux and is unsure why symptoms have increased.     Goals Addressed             This Visit's Progress    Patient stated:  Management of Afib       Care Coordination Interventions: Counseled on increased risk of stroke due to Afib and benefits of anticoagulation for stroke prevention Assessed for signs/ symptoms of atrial fibrillation Reviewed scheduled/ upcoming provider appointments Reviewed medications and discussed importance of compliance Advised to notify provider for ongoing symptoms of acid reflux.  Advised to notify provider for break through Atrial fibrillation symptoms.            SDOH assessments and interventions completed:  No     Care Coordination Interventions:  Yes, provided   Follow up plan: Follow up call scheduled for 05/21/21    Encounter Outcome:  Pt. Visit Completed   Quinn Plowman RN,BSN,CCM [place] [phone] direct line

## 2022-04-13 ENCOUNTER — Other Ambulatory Visit: Payer: Self-pay

## 2022-04-13 DIAGNOSIS — K219 Gastro-esophageal reflux disease without esophagitis: Secondary | ICD-10-CM

## 2022-04-13 MED ORDER — OMEPRAZOLE 20 MG PO CPDR: 20.0000 mg | DELAYED_RELEASE_CAPSULE | Freq: Two times a day (BID) | ORAL | 1 refills | Status: DC

## 2022-04-13 NOTE — Telephone Encounter (Signed)
Fax from [facility] for refill on Omeprazole

## 2022-04-27 DIAGNOSIS — H353124 Nonexudative age-related macular degeneration, left eye, advanced atrophic with subfoveal involvement: Secondary | ICD-10-CM | POA: Diagnosis not present

## 2022-05-21 ENCOUNTER — Ambulatory Visit: Payer: Self-pay

## 2022-05-21 NOTE — Patient Outreach (Signed)
  Care Coordination   Follow Up Visit Note   05/21/2022 Name: Adam Juarez MRN: [id number] DOB: 1941-03-28  Adam Juarez is a 82 y.o. year old male who sees Pleas Koch, NP for primary care. I  spoke with patients wife, Adam Juarez today.  What matters to the patients health and wellness today?  Managing health conditions: Atrial fibrillation, dementia, and macular degeneration    Goals Addressed             This Visit's Progress    Patient stated:  Management of Afib       Care Coordination Interventions: Care Coordination Interventions: Evaluation of current treatment plan related to atrial fibrillation and patient's adherence to plan as established by provider:  Wife states patient is doing well related to his atrial fibrillation.  She denies him having or complianing of any break through atrial fibrillation symptoms. 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  She states he has good days and ok days.   Wife states patient is also seeing an ophthalmologist for macular degeneration. She states his left eye has worsened and he is currently undergoing treatment. Wife states sometimes the shot treatments he has causes him to have bleeding in his eye. She states she has made the ophthalmology office aware of this.  Wife states she is concerned due to patient still taking Eliquis. Advised wife to call ophthalmologist office today and report patients ongoing blood tinged eye and remind that patient is taking Eliquis.  Discussed plans with patient for ongoing care management follow up: Wife agreed to next telephone outreach with [facility] for 09/11/22.  Assessed for signs/ symptoms of atrial fibrillation  scheduled/ upcoming provider appointments Reviewed medications and discussed importance of compliance Offered to send education articles in MyChart on dementia and Macular degeneration. Stressed importance of fall safety and routine:  Wife agreed to receive education  information.            SDOH assessments and interventions completed:  No     Care Coordination Interventions:  Yes, provided   Follow up plan: Follow up call scheduled for 09/11/22    Encounter Outcome:  Pt. Visit Completed   Quinn Plowman RN,BSN,CCM [place] [phone] direct line

## 2022-05-21 NOTE — Patient Instructions (Signed)
Visit Information  Thank you for taking time to visit with me today. Please don't hesitate to contact me if I can be of assistance to you.   Following are the goals we discussed today:   Goals Addressed             This Visit's Progress    Patient stated:  Management of Afib       Care Coordination Interventions: Care Coordination Interventions: Evaluation of current treatment plan related to atrial fibrillation and patient's adherence to plan as established by provider:  Wife states patient is doing well related to his atrial fibrillation.  She denies him having or complianing of any break through atrial fibrillation symptoms. 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  She states he has good days and ok days.   Wife states patient is also seeing an ophthalmologist for macular degeneration. She states his left eye has worsened and he is currently undergoing treatment. Wife states sometimes the shot treatments he has causes him to have bleeding in his eye. She states she has made the ophthalmology office aware of this.  Wife states she is concerned due to patient still taking Eliquis. Advised wife to call ophthalmologist office today and report patients ongoing blood tinged eye and remind that patient is taking Eliquis.  Discussed plans with patient for ongoing care management follow up: Wife agreed to next telephone outreach with [facility] for 09/11/22.  Assessed for signs/ symptoms of atrial fibrillation  scheduled/ upcoming provider appointments Reviewed medications and discussed importance of compliance Offered to send education articles in MyChart on dementia and Macular degeneration. Stressed importance of fall safety and routine:  Wife agreed to receive education information.            Our next appointment is by telephone on 09/11/22 at 1:30 pm  Please call the care guide team at [phone] if you need to cancel or reschedule your appointment.   If you are  experiencing a Mental Health or [place] or need someone to talk to, please call the Suicide and Crisis Lifeline: 988 call 1-800-273-TALK (toll free, 24 hour hotline)  Patient verbalizes understanding of instructions and care plan provided today and agrees to view in [place]. Active MyChart status and patient understanding of how to access instructions and care plan via MyChart confirmed with patient.     Quinn Plowman RN,BSN,CCM [facility] Care Coordination [phone] direct line  Dementia Caregiver Guide Dementia is a term used to describe a number of symptoms that affect memory and thinking. The most common symptoms include: Memory loss. Trouble with language and communication. Trouble concentrating. Poor judgment and problems with reasoning. Wandering from home or public places. Extreme anxiety or depression. Being suspicious or having angry outbursts and accusations. Child-like behavior and language. Dementia can be frightening and confusing. And taking care of someone with dementia can be challenging. This guide provides tips to help you when providing care for a person with dementia. How to help manage lifestyle changes Dementia usually gets worse slowly over time. In the early stages, people with dementia can stay independent and safe with some help. In later stages, they need help with daily tasks such as dressing, grooming, and using the bathroom. There are actions you can take to help a person manage his or her life while living with this condition. Communicating When the person is talking or seems frustrated, make eye contact and hold the person's hand. Ask specific questions that need yes or no answers.  Use simple words, short sentences, and a calm voice. Only give one direction at a time. When offering choices, limit the person to just one or two. Avoid correcting the person in a negative way. If the person is struggling to find the right words, gently try to help  him or her. Preventing injury  Keep floors clear of clutter. Remove rugs, magazine racks, and floor lamps. Keep hallways well lit, especially at night. Put a handrail and nonslip mat in the bathtub or shower. Put childproof locks on cabinets that contain dangerous items, such as medicines, alcohol, guns, toxic cleaning items, sharp tools or utensils, matches, and lighters. For doors to the outside of the house, put the locks in places where the person cannot see or reach them easily. This will help ensure that the person does not wander out of the house and get lost. Be prepared for emergencies. Keep a list of emergency phone numbers and addresses in a convenient area. Remove car keys and lock garage doors so that the person does not try to get in the car and drive. Have the person wear a bracelet that tracks locations and identifies the person as having memory problems. This should be worn at all times for safety. Helping with daily life  Keep the person on track with his or her routine. Try to identify areas where the person may need help. Be supportive, patient, calm, and encouraging. Gently remind the person that adjusting to changes takes time. Help with the tasks that the person has asked for help with. Keep the person involved in daily tasks and decisions as much as possible. Encourage conversation, but try not to get frustrated if the person struggles to find words or does not seem to appreciate your help. How to recognize stress Look for signs of stress in yourself and in the person you are caring for. If you notice signs of stress, take steps to manage it. Symptoms of stress include: Feeling anxious, irritable, frustrated, or angry. Denying that the person has dementia or that his or her symptoms will not improve. Feeling depressed, hopeless, or unappreciated. Difficulty sleeping. Difficulty concentrating. Developing stress-related health problems. Feeling like you have too  little time for your own life. Follow these instructions at home: Take care of your health Make sure that you and the person you are caring for: Get regular sleep. Exercise regularly. Eat regular, nutritious meals. Take over-the-counter and prescription medicines only as told by your health care providers. Drink enough fluid to keep your urine pale yellow. Attend all scheduled health care appointments.  General instructions Join a support group with others who are caregivers. Ask about respite care resources. Respite care can provide short-term care for the person so that you can have a regular break from the stress of caregiving. Consider any safety risks and take steps to avoid them. Organize medicines in a pill box for each day of the week. Create a plan to handle any legal or financial matters. Get legal or financial advice if needed. Keep a calendar in a central location to remind the person of appointments or other activities. Where to find support: Many individuals and organizations offer support. These include: Support groups for people with dementia. Support groups for caregivers. Counselors or therapists. Home health care services. Adult day care centers. Where to find more information Centers for Disease Control and Prevention: http://www.wolf.info/ Alzheimer's Association: [place].co.nz Family Caregiver Alliance: www.caregiver.[place]: www.alzfdn.org Contact a health care provider if: The person's health  is rapidly getting worse. You are no longer able to care for the person. Caring for the person is affecting your physical and emotional health. You are feeling depressed or anxious about caring for the person. Get help right away if: The person threatens himself or herself, you, or anyone else. You feel depressed or sad, or feel that you want to harm yourself. If you ever feel like your loved one may hurt himself or herself or others, or if he or  she shares thoughts about taking his or her own life, get help right away. You can go to your nearest emergency department or: Call your local emergency services (911 in the [place] Call a suicide crisis helpline, such as the [place] at 609-832-6133 or 988 in the [place]. This is open 24 hours a day in the [place] Text the Crisis Text Line at 828-429-2862 (in the [place] Summary Dementia is a term used to describe a number of symptoms that affect memory and thinking. Dementia usually gets worse slowly over time. Take steps to reduce the person's risk of injury and to plan for future care. Caregivers need support, relief from caregiving, and time for their own lives. This information is not intended to replace advice given to you by your health care provider. Make sure you discuss any questions you have with your health care provider. Document Revised: 11/23/2020 Document Reviewed: 09/14/2019 Elsevier Patient Education  [place] Degeneration  Age-related macular degeneration (ARMD) is an eye disease related to aging. The disease is a problem of one of the back layers of the eye (retina) causing a loss of central vision. Central vision allows a person to see objects clearly and do daily tasks like reading and driving. There are two main types of ARMD: Dry ARMD. People with this type generally lose their vision slowly. This is the most common type of ARMD. Some people with dry ARMD notice very little change in their vision as they age. Wet ARMD. People with this type can lose their vision quickly. What are the causes? This condition is caused by damage to the part of the retina that provides you with central vision (macula). Dry ARMD happens when deposits in the macula cause light-sensitive cells to slowly break down. Wet ARMD happens when abnormal blood vessels grow under the macula and leak blood and fluid. What increases the risk? You are more  likely to develop this condition if you: Are 41 years old or older, and especially 69 years old or older. Smoke. Are obese. Have a family history of ARMD. Have high cholesterol, high blood pressure, or heart disease. Have been exposed to high levels of ultraviolet (UV) light and blue light. Are white (Caucasian). Are male. What are the signs or symptoms? Common symptoms of this condition include: Blurred vision, especially when reading print material. The blurred vision often improves in brighter light. A blurred or blind spot in the center of your field of vision that is small but growing larger. Bright colors seeming less bright than they used to be. Decreased ability to recognize and see faces. One eye seeing worse than the other. Decreased ability to adapt to dimly lit rooms. Straight lines appearing crooked or wavy. How is this diagnosed? This condition is diagnosed based on your symptoms and an eye exam. During the eye exam: Eye drops will be placed into your eyes to enlarge (dilate) your pupils. This will allow your health care provider to see the back  of your eye. You may be asked to look at an image that looks like a checkerboard (Amsler grid). Early changes in your central vision may cause the grid to appear distorted. During the exam, you may be given one or both of these tests: Intravenous fluorescein angiogram. This test helps determine whether you have dry or wet ARMD. Optical coherence tomography (OCT) test to evaluate deep layers of the retina. How is this treated? There is no cure for this condition, but treatment can help to slow down progression of ARMD. Dry ARMD is often treated with vitamin supplements. Wet ARMD may be treatable with medications placed inside the eye or laser treatments. Treatments include: Supplements, including vitamin C, vitamin E, lutein, zeaxanthin, copper, and zinc. Injections of medicines (including anti-blood vessel growth factors and  steroids) into your eye to slow down the formation of abnormal blood vessels that may leak and decrease fluid accumulation. These injections often need to be repeated on a routine basis. Laser surgery to destroy abnormal blood vessels or leaking blood vessels in your eye. Low vision aids may be very helpful if you have poor vision in both eyes from ARMD. Examples of low vision aids include magnifiers, tablet computers, and large-print products. Follow these instructions at home: Do not use any products that contain nicotine or tobacco. These products include cigarettes, chewing tobacco, and vaping devices, such as e-cigarettes. If you need help quitting, ask your health care provider. Take over-the-counter and prescription medicines only as told by your health care provider. Take vitamins and supplements as told by your health care provider. Ask your health care provider for an Amsler grid. Use it every day to check each eye for vision changes. Get an eye exam as often as told by your health care provider. Make sure to get an eye exam at least once every year. Keep all follow-up visits. This is important. Where to find support Macular Disease Society: www.macularsociety.[place] Academy of Ophthalmology: http://reyes-guerrero.com/ Contact a health care provider if: You notice any changes in your vision. Get help right away if: You suddenly lose vision or develop pain in the eye. Summary Age-related macular degeneration (ARMD) is an eye disease related to aging. There are two types of this condition: dry ARMD and wet ARMD. This condition is caused by damage to the part of the retina that provides you with central vision (macula). While there is no cure for ARMD, treatments can slow down progression. Once diagnosed with ARMD, make sure to get an eye exam every year, take supplements and vitamins as directed, use an Amsler grid at home, and follow up with your health care provider as directed. This  information is not intended to replace advice given to you by your health care provider. Make sure you discuss any questions you have with your health care provider. Document Revised: 12/20/2020 Document Reviewed: 12/20/2020 Elsevier Patient Education  [place].

## 2022-05-25 ENCOUNTER — Ambulatory Visit (INDEPENDENT_AMBULATORY_CARE_PROVIDER_SITE_OTHER): Payer: Medicare Other

## 2022-05-25 VITALS — Ht 70.0 in | Wt 199.0 lb

## 2022-05-25 DIAGNOSIS — H903 Sensorineural hearing loss, bilateral: Secondary | ICD-10-CM | POA: Insufficient documentation

## 2022-05-25 DIAGNOSIS — I479 Paroxysmal tachycardia, unspecified: Secondary | ICD-10-CM | POA: Insufficient documentation

## 2022-05-25 DIAGNOSIS — Z0000 Encounter for general adult medical examination without abnormal findings: Secondary | ICD-10-CM

## 2022-05-25 DIAGNOSIS — K219 Gastro-esophageal reflux disease without esophagitis: Secondary | ICD-10-CM | POA: Insufficient documentation

## 2022-05-25 DIAGNOSIS — Z7189 Other specified counseling: Secondary | ICD-10-CM | POA: Insufficient documentation

## 2022-05-25 DIAGNOSIS — G912 (Idiopathic) normal pressure hydrocephalus: Secondary | ICD-10-CM | POA: Insufficient documentation

## 2022-05-25 NOTE — Progress Notes (Signed)
Virtual Visit via Telephone Note  I connected with  Adam Juarez on 05/25/22 at 11:15 AM EST by telephone and verified that I am speaking with the correct person using two identifiers.  Location: Patient: home Provider: [place] Persons participating in the virtual visit: [place]   I discussed the limitations, risks, security and privacy concerns of performing an evaluation and management service by telephone and the availability of in person appointments. The patient expressed understanding and agreed to proceed.  Interactive audio and video telecommunications were attempted between this nurse and patient, however failed, due to patient having technical difficulties OR patient did not have access to video capability.  We continued and completed visit with audio only.  Some vital signs may be absent or patient reported.   Dionisio David, LPN  Subjective:   Clare Casto is a 82 y.o. male who presents for Medicare Annual/Subsequent preventive examination.  Review of Systems     Cardiac Risk Factors include: advanced age (>12mn, >>13women);dyslipidemia;hypertension;male gender     Objective:    There were no vitals filed for this visit. There is no height or weight on file to calculate BMI.     05/25/2022   11:19 AM 07/12/2021    9:48 AM 05/24/2021   11:21 AM 08/10/2020   10:51 AM 01/05/2020    8:58 AM 12/07/2019    7:48 AM 10/29/2019   10:27 AM  Advanced Directives  Does Patient Have a Medical Advance Directive? Yes Yes Yes Yes Yes Yes Yes  Type of AParamedicof A[place]angeLiving will HBaldwinvilleLiving will HCo[place]Living will HRed BankLiving will HLimaLiving will HBudeLiving will HNatomaLiving will  Does patient want to make changes to medical advance directive? No - Patient declined No - Patient  declined Yes (MAU/Ambulatory/Procedural Areas - Information given)    No - Patient declined  Copy of HM[place] Cityin Chart? Yes - validated most recent copy scanned in chart (See row information) No - copy requested Yes - validated most recent copy scanned in chart (See row information) No - copy requested Yes - validated most recent copy scanned in chart (See row information)  No - copy requested  Would patient like information on creating a medical advance directive?  No - Patient declined         Current Medications (verified) Outpatient Encounter Medications as of 05/25/2022  Medication Sig   acetaminophen (TYLENOL) 500 MG tablet Take 1,000 mg by mouth every 8 (eight) hours as needed for moderate pain.   ascorbic acid (VITAMIN C) 500 MG tablet Take 500 mg by mouth daily.   atorvastatin (LIPITOR) 20 MG tablet Take 1 tablet (20 mg total) by mouth daily. for cholesterol.   calcium carbonate (TUMS - DOSED IN MG ELEMENTAL CALCIUM) 500 MG chewable tablet Chew 1 tablet by mouth. As needed   ELIQUIS 5 MG TABS tablet TAKE 1 TABLET TWICE A DAY   EPINEPHRINE 0.3 mg/0.3 mL IJ SOAJ injection INJECT 0.3 ML (0.3 MG TOTAL) INTO THE MUSCLE AS NEEDED FOR ANAPHYLAXIS   hydrocortisone (ANUSOL-HC) 25 MG suppository Place 1 suppository (25 mg total) rectally 2 (two) times daily.   Magnesium Cl-Calcium Carbonate (SLOW-MAG PO) Take 1 tablet by mouth daily.    Magnesium Oxide 500 MG TABS Take by mouth.   Melatonin 1 MG CAPS Take 1 mg by mouth at bedtime as needed (sleep).  memantine (NAMENDA) 5 MG tablet Take 5 mg by mouth 2 (two) times daily.   metoprolol succinate (TOPROL-XL) 50 MG 24 hr tablet Take 1 tablet (50 mg total) by mouth 2 (two) times daily.   Multiple Vitamin (MULTIVITAMIN) tablet Take 1 tablet by mouth daily.   Multiple Vitamins-Minerals (PRESERVISION AREDS 2 PO) Take 1 capsule by mouth 2 (two) times daily.    omeprazole (PRILOSEC) 20 MG capsule Take 1 capsule (20 mg total) by mouth 2  (two) times daily before a meal. For heartburn.   Polyethyl Glycol-Propyl Glycol (SYSTANE OP) Apply 1 drop to eye 3 (three) times daily as needed (dry eyes).   Potassium Gluconate 550 MG TABS Take 550 mg by mouth daily.   Probiotic Product (PROBIOTIC DAILY PO) Take 1 capsule by mouth daily.    vitamin B-12 (CYANOCOBALAMIN) 1000 MCG tablet Take 1,000 mcg by mouth daily.   diclofenac Sodium (VOLTAREN) 1 % GEL Apply 1 application topically 4 (four) times daily as needed (pain). (Patient not taking: Reported on 07/12/2021)   escitalopram (LEXAPRO) 5 MG tablet Take 5 mg by mouth daily.   No facility-administered encounter medications on file as of 05/25/2022.    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 a-fib x 1 episode patient states he "auto corrected" seen at [facility]   GERD (gastroesophageal reflux disease)    Headache    poor posture - none recently   History of kidney stones    Hydrocephalus (HCC)    Hyperlipidemia    Neuropathy    PSVT (paroxysmal supraventricular tachycardia) 2009   Controlled with "breathing process"   Renal stone 9/08, 6/15   Wears dentures    full upper   Past Surgical History:  Procedure Laterality Date   CARDIAC CATHETERIZATION  2008   [place], [place]   CATARACT EXTRACTION [facility] Left 07/27/2015   Procedure: CATARACT EXTRACTION PHACO AND INTRAOCULAR LENS PLACEMENT ([place]);  Surgeon: Leandrew Koyanagi, MD;  Location: [place];  Service: Ophthalmology;  Laterality: Left;  TORIC   CATARACT EXTRACTION W/PHACO Right 08/24/2015   Procedure: CATARACT EXTRACTION PHACO AND INTRAOCULAR LENS PLACEMENT ([facility]);  Surgeon: Leandrew Koyanagi, MD;  Location: [place];  Service: Ophthalmology;  Laterality: Right;  TORIC   COLONOSCOPY WITH PROPOFOL N/A  08/10/2020   Procedure: COLONOSCOPY WITH PROPOFOL;  Surgeon: Tol[place], Benay Pike, MD;  Location: [facility] ENDOSCOPY;  Service: Gastroenterology;  Laterality: N/A;   HEMORROIDECTOMY  2010   banding   IR ANGIO INTRA EXTRACRAN SEL COM CAROTID INNOMINATE BILAT MOD SED  10/30/2019   IR ANGIO VERTEBRAL SEL VERTEBRAL UNI R MOD SED  10/30/2019   IR RADIOLOGIST EVAL & MGMT  04/11/2020   IR US GUIDE VASC ACCESS RIGHT  10/30/2019   KIDNEY STONE SURGERY  9/08, 6/15   lithotripsy   LAPAROSCOPIC REVISION VENTRICULAR-PERITONEAL (V-P) SHUNT  01/08/2020   Procedure: LAPAROSCOPIC REVISION VENTRICULAR-PERITONEAL (V-P) SHUNT;  Surgeon: Consuella Lose, MD;  Location: [place];  Service: Neurosurgery;;   PROSTATE BIOPSY  2007   SKIN CANCER EXCISION     TOENAIL TRIMMING  11/2017   Dr. Elvina Mattes   TONSILLECTOMY     VASECTOMY     VENTRICULOPERITONEAL SHUNT Right 01/08/2020   Procedure: SHUNT INSERTION VENTRICULAR-PERITONEAL;  Surgeon: Consuella Lose, MD;  Location: [facility];  Service: Neurosurgery;  Laterality: Right;   Family History  Problem Relation Age of Onset   AAA (abdominal aortic aneurysm) Father    Parkinson's disease Mother    Arthritis Mother    Social History   Socioeconomic History   Marital status: Married    Spouse name: Not on file   Number of children: Not on file   Years of education: Not on file   Highest education level: Not on file  Occupational History   Not on file  Tobacco Use   Smoking status: Former    Packs/day: 0.50    Years: 38.00    Total pack years: 19.00    Types: Cigarettes    Quit date: 07/13/1997    Years since quitting: 24.8   Smokeless tobacco: Never  Vaping Use   Vaping Use: Never used  Substance and Sexual Activity   Alcohol use: No   Drug use: No   Sexual activity: Not on file  Other Topics Concern   Not on file  Social History Narrative   Not on file   Social Determinants of Health   Financial Resource Strain: Low Risk  (05/25/2022)   Overall Financial  Resource Strain (CARDIA)    Difficulty of Paying Living Expenses: Not hard at all  Food Insecurity: No Food Insecurity (05/25/2022)   Hunger Vital Sign    Worried About Running Out of Food in the Last Year: Never true    [place] in the Last Year: Never true  Transportation Needs: No Transportation Needs (05/25/2022)   PRAPARE - Hydrologist (Medical): No    Lack of Transportation (Non-Medical): No  Physical Activity: Inactive (05/25/2022)   Exercise Vital Sign    Days of Exercise per Week: 0 days    Minutes of Exercise per Session: 0 min  Stress: No Stress Concern Present (05/25/2022)   [place] of Stress : Not at all  Social Connections: [place] (05/25/2022)   Social Connection and Isolation Panel [NHANES]    Frequency of Communication with Friends and Family: Twice a week    Frequency of Social Gatherings with Friends and Family: Twice a week    Attends Religious Services: More than 4 times per year    Active Member of [employer] or Organizations: Yes    Attends Music therapist: More than 4 times per year    Marital Status: Married    Tobacco Counseling Counseling given: Not Answered   Clinical Intake:  Pre-visit preparation completed: Yes  Pain : No/denies pain     Diabetes: No  How often do you need to have someone help you when you read instructions, pamphlets, or other written materials from your doctor or pharmacy?: 1 - Never  Diabetic?no  Interpreter Needed?: No  Information entered by :: Kirke Shaggy, LPN   Activities of Daily Living    05/25/2022   11:19 AM 05/25/2022   10:30 AM  In your present state of health, do you have any difficulty performing the following activities:  Hearing? 0 0  Vision? 1 1  Difficulty concentrating or making decisions? 1 1  Walking or climbing stairs? 0 0  Dressing or bathing? 0 0  Doing  errands, shopping? 0 0  Preparing Food and eating ? N N  Using the Toilet? N N  In the past six months, have you accidently leaked urine? N  N  Do you have problems with loss of bowel control? N N  Managing your Medications? N N  Managing your Finances? N N  Housekeeping or managing your Housekeeping? N N    Patient Care Team: Pleas Koch, NP as PCP - General (Internal Medicine) Rockey Situ Kathlene November, MD as PCP - Cardiology (Cardiology) Minna Merritts, MD as Consulting Physician (Cardiology) Leandrew Koyanagi, MD as Referring Physician (Ophthalmology) Ccs, Md, MD as Consulting Physician (General Surgery) Caryl Comes, MD as Physician Assistant (Neurosurgery)  Indicate any recent Medical Services you may have received from other than [facility] providers in the past year (date may be approximate).     Assessment:   This is a routine wellness examination for [place].  Hearing/Vision screen Hearing Screening - Comments:: No aids Vision Screening - Comments:: Wears glasses- [facility]  Dietary issues and exercise activities discussed: Current Exercise Habits: The patient does not participate in regular exercise at present   Goals Addressed             This Visit's Progress    DIET - EAT MORE FRUITS AND VEGETABLES         Depression Screen    05/24/2021   11:24 AM 07/21/2020    2:58 PM 02/06/2019    3:43 PM 01/28/2018   11:52 AM 01/10/2017    8:30 AM 03/14/2016    3:23 PM  PHQ 2/9 Scores  PHQ - 2 Score 0 3 0 0 0 0  PHQ- 9 Score  12  0 1     Fall Risk    05/25/2022   11:19 AM 05/25/2022   10:30 AM 05/24/2021   11:22 AM 12/01/2020    7:49 AM 02/06/2019    3:43 PM  Fall Risk   Falls in the past year? 0 0 0 0 0  Number falls in past yr: 0 0 0 0 0  Injury with Fall? 0  0 0 0  Risk for fall due to : No Fall Risks  No Fall Risks History of fall(s)   Follow up Falls prevention discussed;Falls evaluation completed  Falls prevention discussed      FALL RISK PREVENTION  PERTAINING TO THE HOME:  Any stairs in or around the home? No  If so, are there any without handrails? No  Home [place] of loose throw rugs in walkways, pet beds, electrical cords, etc? Yes  Adequate lighting in your home to reduce risk of falls? Yes   ASSISTIVE DEVICES UTILIZED TO PREVENT FALLS:  Life alert? No  Use of a cane, walker or w/c? No  Grab bars in the bathroom? Yes  Shower chair or bench in shower? Yes  Elevated toilet seat or a handicapped toilet? Yes   Cognitive Function:    09/08/2019   10:37 AM 07/15/2019    1:23 PM 01/28/2018   11:53 AM 01/10/2017    8:46 AM  MMSE - Mini Mental State Exam  Orientation to time '5 5 5 5  '$ Orientation to Place '5 5 5 5  '$ Registration '3 3 3 3  '$ Attention/ Calculation 3 5 0 0  Recall '3 3 3 3  '$ Language- name 2 objects 2 2 0 0  Language- repeat '1 1 1 1  '$ Language- follow 3 step command '3 3 3 3  '$ Language- read & follow direction 1 1 0 0  Write a sentence 1 1 0 0  Copy design 1 1 0 0  Total score '28 30 20 '$ 20  05/25/2022   11:24 AM  6CIT Screen  What Year? 0 points  What month? 0 points  What time? 0 points  Count back from 20 0 points  Months in reverse 2 points  Repeat phrase 2 points  Total Score 4 points    Immunizations Immunization History  Administered Date(s) Administered   Fluad Quad(high Dose 65+) 02/09/2020, 03/01/2021   Influenza,inj,Quad PF,6+ Mos 04/12/2017, 01/28/2018, 02/06/2019   Influenza-Unspecified 04/12/2017, 01/28/2018, 02/06/2019   PFIZER Comirnaty(Gray Top)Covid-19 Tri-Sucrose Vaccine 06/10/2019, 07/01/2019   PFIZER(Purple Top)SARS-COV-2 Vaccination 06/10/2019, 07/01/2019, 03/02/2020, 11/23/2020   Pneumococcal Conjugate-13 01/10/2017   Pneumococcal Polysaccharide-23 08/13/2010, 09/01/2010   Tdap 05/27/2011, 06/13/2011   Zoster, Live 09/01/2010    TDAP status: Up to date  Flu Vaccine status: Up to date  Pneumococcal vaccine status: Up to date  Covid-19 vaccine status: Completed  vaccines  Qualifies for Shingles Vaccine? Yes   Zostavax completed Yes   Shingrix Completed?: Yes  Screening Tests Health Maintenance  Topic Date Due   Zoster Vaccines- Shingrix (1 of 2) Never done   DTaP/Tdap/Td (3 - Td or Tdap) 06/12/2021   INFLUENZA VACCINE  12/12/2021   COVID-19 Vaccine (7 - 2023-24 season) 01/12/2022   Medicare Annual Wellness (AWV)  05/26/2023   Pneumonia Vaccine 82+ Years old  Completed   HPV VACCINES  Aged Out    Health Maintenance  Health Maintenance Due  Topic Date Due   Zoster Vaccines- Shingrix (1 of 2) Never done   DTaP/Tdap/Td (3 - Td or Tdap) 06/12/2021   INFLUENZA VACCINE  12/12/2021   COVID-19 Vaccine (7 - 2023-24 season) 01/12/2022    Colorectal cancer screening: Type of screening: Colonoscopy. Completed 05/05/21. Repeat every 5 years  Lung Cancer Screening: (Low Dose CT Chest recommended if Age 55-80 years, 30 pack-year currently smoking OR have quit w/in 15years.) does not qualify.   Additional Screening:  Hepatitis C Screening: does qualify; Completed 11/18/15  Vision Screening: Recommended annual ophthalmology exams for early detection of glaucoma and other disorders of the eye. Is the patient up to date with their annual eye exam?  No  Who is the provider or what is the name of the office in which the patient attends annual eye exams? [place] If pt is not established with a provider, would they like to be referred to a provider to establish care? No .   Dental Screening: Recommended annual dental exams for proper oral hygiene  Community Resource Referral / Chronic Care Management: CRR required this visit?  No   CCM required this visit?  No      Plan:     I have personally reviewed and noted the following in the patient's chart:   Medical and social history Use of alcohol, tobacco or illicit drugs  Current medications and supplements including opioid prescriptions. Patient is not currently taking opioid  prescriptions. Functional ability and status Nutritional status Physical activity Advanced directives List of other physicians Hospitalizations, surgeries, and ER visits in previous 12 months Vitals Screenings to include cognitive, depression, and falls Referrals and appointments  In addition, I have reviewed and discussed with patient certain preventive protocols, quality metrics, and best practice recommendations. A written personalized care plan for preventive services as well as general preventive health recommendations were provided to patient.     Dionisio David, LPN   [id number]   Nurse Notes: none

## 2022-05-25 NOTE — Patient Instructions (Signed)
Adam Juarez , Thank you for taking time to come for your Medicare Wellness Visit. I appreciate your ongoing commitment to your health goals. Please review the following plan we discussed and let me know if I can assist you in the future.   Screening recommendations/referrals: Colonoscopy: aged out Recommended yearly ophthalmology/optometry visit for glaucoma screening and checkup Recommended yearly dental visit for hygiene and checkup  Vaccinations: Influenza vaccine: 01/01/22 Pneumococcal vaccine: 03/01/21 Tdap vaccine: 03/12/12, due if have injury Shingles vaccine: Zostavax 09/01/10   Covid-19: 06/10/19, 07/01/19, 03/02/20, 11/23/20  Advanced directives: yes  Conditions/risks identified: none  Next appointment: Follow up in one year for your annual wellness visit. 05/27/23 @ 10:15 am by phone  Preventive Care 65 Years and Older, Male Preventive care refers to lifestyle choices and visits with your health care provider that can promote health and wellness. What does preventive care include? A yearly physical exam. This is also called an annual well check. Dental exams once or twice a year. Routine eye exams. Ask your health care provider how often you should have your eyes checked. Personal lifestyle choices, including: Daily care of your teeth and gums. Regular physical activity. Eating a healthy diet. Avoiding tobacco and drug use. Limiting alcohol use. Practicing safe sex. Taking low doses of aspirin every day. Taking vitamin and mineral supplements as recommended by your health care provider. What happens during an annual well check? The services and screenings done by your health care provider during your annual well check will depend on your age, overall health, lifestyle risk factors, and family history of disease. Counseling  Your health care provider may ask you questions about your: Alcohol use. Tobacco use. Drug use. Emotional well-being. Home and relationship  well-being. Sexual activity. Eating habits. History of falls. Memory and ability to understand (cognition). Work and work Statistician. Screening  You may have the following tests or measurements: Height, weight, and BMI. Blood pressure. Lipid and cholesterol levels. These may be checked every 5 years, or more frequently if you are over 28 years old. Skin check. Lung cancer screening. You may have this screening every year starting at age 56 if you have a 30-pack-year history of smoking and currently smoke or have quit within the past 15 years. Fecal occult blood test (FOBT) of the stool. You may have this test every year starting at age 10. Flexible sigmoidoscopy or colonoscopy. You may have a sigmoidoscopy every 5 years or a colonoscopy every 10 years starting at age 5. Prostate cancer screening. Recommendations will vary depending on your family history and other risks. Hepatitis C blood test. Hepatitis B blood test. Sexually transmitted disease (STD) testing. Diabetes screening. This is done by checking your blood sugar (glucose) after you have not eaten for a while (fasting). You may have this done every 1-3 years. Abdominal aortic aneurysm (AAA) screening. You may need this if you are a current or former smoker. Osteoporosis. You may be screened starting at age 28 if you are at high risk. Talk with your health care provider about your test results, treatment options, and if necessary, the need for more tests. Vaccines  Your health care provider may recommend certain vaccines, such as: Influenza vaccine. This is recommended every year. Tetanus, diphtheria, and acellular pertussis (Tdap, Td) vaccine. You may need a Td booster every 10 years. Zoster vaccine. You may need this after age 42. Pneumococcal 13-valent conjugate (PCV13) vaccine. One dose is recommended after age 42. Pneumococcal polysaccharide (PPSV23) vaccine. One dose is recommended after  age 97. Talk to your health care  provider about which screenings and vaccines you need and how often you need them. This information is not intended to replace advice given to you by your health care provider. Make sure you discuss any questions you have with your health care provider. Document Released: 05/27/2015 Document Revised: 01/18/2016 Document Reviewed: 03/01/2015 Elsevier Interactive Patient Education  2017 [place] Prevention in the Home Falls can cause injuries. They can happen to people of all ages. There are many things you can do to make your home safe and to help prevent falls. What can I do on the outside of my home? Regularly fix the edges of walkways and driveways and fix any cracks. Remove anything that might make you trip as you walk through a door, such as a raised step or threshold. Trim any bushes or trees on the path to your home. Use bright outdoor lighting. Clear any walking paths of anything that might make someone trip, such as rocks or tools. Regularly check to see if handrails are loose or broken. Make sure that both sides of any steps have handrails. Any raised decks and porches should have guardrails on the edges. Have any leaves, snow, or ice cleared regularly. Use sand or salt on walking paths during winter. Clean up any spills in your garage right away. This includes oil or grease spills. What can I do in the bathroom? Use night lights. Install grab bars by the toilet and in the tub and shower. Do not use towel bars as grab bars. Use non-skid mats or decals in the tub or shower. If you need to sit down in the shower, use a plastic, non-slip stool. Keep the floor dry. Clean up any water that spills on the floor as soon as it happens. Remove soap buildup in the tub or shower regularly. Attach bath mats securely with double-sided non-slip rug tape. Do not have throw rugs and other things on the floor that can make you trip. What can I do in the bedroom? Use night lights. Make  sure that you have a light by your bed that is easy to reach. Do not use any sheets or blankets that are too big for your bed. They should not hang down onto the floor. Have a firm chair that has side arms. You can use this for support while you get dressed. Do not have throw rugs and other things on the floor that can make you trip. What can I do in the kitchen? Clean up any spills right away. Avoid walking on wet floors. Keep items that you use a lot in easy-to-reach places. If you need to reach something above you, use a strong step stool that has a grab bar. Keep electrical cords out of the way. Do not use floor polish or wax that makes floors slippery. If you must use wax, use non-skid floor wax. Do not have throw rugs and other things on the floor that can make you trip. What can I do with my stairs? Do not leave any items on the stairs. Make sure that there are handrails on both sides of the stairs and use them. Fix handrails that are broken or loose. Make sure that handrails are as long as the stairways. Check any carpeting to make sure that it is firmly attached to the stairs. Fix any carpet that is loose or worn. Avoid having throw rugs at the top or bottom of the stairs. If you do  have throw rugs, attach them to the floor with carpet tape. Make sure that you have a light switch at the top of the stairs and the bottom of the stairs. If you do not have them, ask someone to add them for you. What else can I do to help prevent falls? Wear shoes that: Do not have high heels. Have rubber bottoms. Are comfortable and fit you well. Are closed at the toe. Do not wear sandals. If you use a stepladder: Make sure that it is fully opened. Do not climb a closed stepladder. Make sure that both sides of the stepladder are locked into place. Ask someone to hold it for you, if possible. Clearly mark and make sure that you can see: Any grab bars or handrails. First and last steps. Where the  edge of each step is. Use tools that help you move around (mobility aids) if they are needed. These include: Canes. Walkers. Scooters. Crutches. Turn on the lights when you go into a dark area. Replace any light bulbs as soon as they burn out. Set up your furniture so you have a clear path. Avoid moving your furniture around. If any of your floors are uneven, fix them. If there are any pets around you, be aware of where they are. Review your medicines with your doctor. Some medicines can make you feel dizzy. This can increase your chance of falling. Ask your doctor what other things that you can do to help prevent falls. This information is not intended to replace advice given to you by your health care provider. Make sure you discuss any questions you have with your health care provider. Document Released: 02/24/2009 Document Revised: 10/06/2015 Document Reviewed: 06/04/2014 Elsevier Interactive Patient Education  2017 [employer].

## 2022-06-07 DIAGNOSIS — D225 Melanocytic nevi of trunk: Secondary | ICD-10-CM | POA: Diagnosis not present

## 2022-06-07 DIAGNOSIS — D2261 Melanocytic nevi of right upper limb, including shoulder: Secondary | ICD-10-CM | POA: Diagnosis not present

## 2022-06-07 DIAGNOSIS — L821 Other seborrheic keratosis: Secondary | ICD-10-CM | POA: Diagnosis not present

## 2022-06-07 DIAGNOSIS — D2262 Melanocytic nevi of left upper limb, including shoulder: Secondary | ICD-10-CM | POA: Diagnosis not present

## 2022-06-07 DIAGNOSIS — L570 Actinic keratosis: Secondary | ICD-10-CM | POA: Diagnosis not present

## 2022-06-07 DIAGNOSIS — D2272 Melanocytic nevi of left lower limb, including hip: Secondary | ICD-10-CM | POA: Diagnosis not present

## 2022-06-07 DIAGNOSIS — D2271 Melanocytic nevi of right lower limb, including hip: Secondary | ICD-10-CM | POA: Diagnosis not present

## 2022-06-22 ENCOUNTER — Ambulatory Visit (INDEPENDENT_AMBULATORY_CARE_PROVIDER_SITE_OTHER)
Admission: RE | Admit: 2022-06-22 | Discharge: 2022-06-22 | Disposition: A | Discharge status: Home / Self Care | Payer: Medicare Other | Source: Ambulatory Visit | Attending: Primary Care | Admitting: Primary Care

## 2022-06-22 ENCOUNTER — Ambulatory Visit (INDEPENDENT_AMBULATORY_CARE_PROVIDER_SITE_OTHER): Payer: Medicare Other | Admitting: Primary Care

## 2022-06-22 ENCOUNTER — Other Ambulatory Visit: Payer: Self-pay | Admitting: Primary Care

## 2022-06-22 ENCOUNTER — Encounter: Payer: Self-pay | Admitting: Primary Care

## 2022-06-22 VITALS — BP 114/74 | HR 59 | Temp 97.9°F | Ht 70.0 in | Wt 201.0 lb

## 2022-06-22 DIAGNOSIS — R1031 Right lower quadrant pain: Secondary | ICD-10-CM | POA: Insufficient documentation

## 2022-06-22 DIAGNOSIS — R8290 Unspecified abnormal findings in urine: Secondary | ICD-10-CM | POA: Diagnosis not present

## 2022-06-22 DIAGNOSIS — M545 Low back pain, unspecified: Secondary | ICD-10-CM | POA: Diagnosis not present

## 2022-06-22 DIAGNOSIS — K219 Gastro-esophageal reflux disease without esophagitis: Secondary | ICD-10-CM

## 2022-06-22 DIAGNOSIS — G8929 Other chronic pain: Secondary | ICD-10-CM | POA: Diagnosis not present

## 2022-06-22 HISTORY — DX: Unspecified abnormal findings in urine: R82.90

## 2022-06-22 LAB — CBC WITH DIFFERENTIAL/PLATELET
Basophils Absolute: 0 10*3/uL (ref 0.0–0.1)
Basophils Relative: 1 % (ref 0.0–3.0)
Eosinophils Absolute: 0.1 10*3/uL (ref 0.0–0.7)
Eosinophils Relative: 3.7 % (ref 0.0–5.0)
HCT: 42.6 % (ref 39.0–52.0)
Hemoglobin: 14.6 g/dL (ref 13.0–17.0)
Lymphocytes Relative: 16.4 % (ref 12.0–46.0)
Lymphs Abs: 0.7 10*3/uL (ref 0.7–4.0)
MCHC: 34.3 g/dL (ref 30.0–36.0)
MCV: 97.9 fl (ref 78.0–100.0)
Monocytes Absolute: 0.5 10*3/uL (ref 0.1–1.0)
Monocytes Relative: 13.3 % — ABNORMAL HIGH (ref 3.0–12.0)
Neutro Abs: 2.6 10*3/uL (ref 1.4–7.7)
Neutrophils Relative %: 65.6 % (ref 43.0–77.0)
Platelets: 133 10*3/uL — ABNORMAL LOW (ref 150.0–400.0)
RBC: 4.35 Mil/uL (ref 4.22–5.81)
RDW: 13.7 % (ref 11.5–15.5)
WBC: 4 10*3/uL (ref 4.0–10.5)

## 2022-06-22 LAB — BASIC METABOLIC PANEL
BUN: 15 mg/dL (ref 6–23)
CO2: 25 mEq/L (ref 19–32)
Calcium: 9.4 mg/dL (ref 8.4–10.5)
Chloride: 103 mEq/L (ref 96–112)
Creatinine, Ser: 0.9 mg/dL (ref 0.40–1.50)
GFR: 80.7 mL/min (ref >60.00)
Glucose, Bld: 105 mg/dL — ABNORMAL HIGH (ref 70–99)
Potassium: 4.3 mEq/L (ref 3.5–5.1)
Sodium: 138 mEq/L (ref 135–145)

## 2022-06-22 LAB — POC URINALSYSI DIPSTICK (AUTOMATED)
Bilirubin, UA: NEGATIVE
Blood, UA: NEGATIVE
Glucose, UA: NEGATIVE
Ketones, UA: NEGATIVE
Leukocytes, UA: NEGATIVE
Nitrite, UA: NEGATIVE
Protein, UA: NEGATIVE
Spec Grav, UA: 1.025 (ref 1.010–1.025)
Urobilinogen, UA: 0.2 E.U./dL
pH, UA: 5.5 (ref 5.0–8.0)

## 2022-06-22 MED ORDER — AMOXICILLIN-POT CLAVULANATE 875-125 MG PO TABS: 1.0000 | ORAL_TABLET | Freq: Two times a day (BID) | ORAL | 0 refills | Status: DC

## 2022-06-22 NOTE — Progress Notes (Addendum)
Subjective:    Patient ID: Adam Juarez, male    DOB: Sep 03, 1940, 82 y.o.   MRN: [id number]  Heartburn He complains of abdominal pain and heartburn.    Alexandr Lupia is a very pleasant 82 y.o. male with a history of paroxysmal atrial fibrillation, SVT, PVC's, AAA, PAD, chronic back pain, chronic fatigue, memory changes, osteoarthritis, normal pressure hydrocephalus, prediabetes, who presents today to discuss multiple concerns.  His wife joins [place] today.  1) Back Pain:  History of chronic back pain which dates years ago. His current episode is located to the right lower midline back. His pain is intermittent but has a current flare that began a few weeks ago. Typically his flares will last for a few days. His pain does improve some with walking.   He remains sedentary throughout the day. He's taken Tylenol with little improvement.  His wife is asking about physical therapy.  2) Foul Smelling Urine: He's noticed intermittent foul smelling urine, occurs with nearly every urination. Acute for the last 1-2 months. He denies dysuria, hematuria, changes in urinary stream.   3) GERD: Currently managed on omeprazole 20 mg twice daily for chronic GERD. Over the last 1-2 months he will notice esophageal burning with throat fullness. Occurs nearly every night with meals and his evening meds, lasting until he takes Tums which provides relief.   Evening meds consist of omeprazole 20 mg, Eliquis, metoprolol succinate 50 mg, memantine, potassium, magnesium. No changes in medications except for Wellbutrin for which he takes in the morning.   4) Abdominal Pain: Symptom onset three days ago to the right lower abdomen. His pain is a constant dull pain with frequent, intermittent, sharp pains throughout the day.   He has a history of diverticulitis, these symptoms feel similar. He began a [employer] yesterday, hasn't noticed improvement. He had diarrhea today. He denies nausea/vomiting.   BP Readings from Last 3  Encounters:  06/22/22 114/74  01/14/22 137/84  11/17/21 136/80      Review of Systems  Constitutional:  Negative for fever.  Gastrointestinal:  Positive for abdominal pain, diarrhea and heartburn.       GERD  Genitourinary:  Negative for dysuria, flank pain, hematuria and urgency.  Musculoskeletal:  Positive for back pain.  Neurological:  Negative for weakness and numbness.         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 a-fib x 1 episode patient states he "auto corrected" seen at [facility]   GERD (gastroesophageal reflux disease)    Headache    poor posture - none recently   History of kidney stones    Hydrocephalus (HCC)    Hyperlipidemia    Neuropathy    PSVT (paroxysmal supraventricular tachycardia) 2009   Controlled with "breathing process"   Renal stone 9/08, 6/15   Wears dentures    full upper    Social History   Socioeconomic History   Marital status: Married    Spouse name: Not on file   Number of children: Not on file   Years of education: Not on file   Highest education level: Not on file  Occupational History   Not on file  Tobacco Use   Smoking status: Former    Packs/day: 0.50    Years: 38.00    Total pack  years: 19.00    Types: Cigarettes    Quit date: 07/13/1997    Years since quitting: 24.9   Smokeless tobacco: Never  Vaping Use   Vaping Use: Never used  Substance and Sexual Activity   Alcohol use: No   Drug use: No   Sexual activity: Not on file  Other Topics Concern   Not on file  Social History Narrative   Not on file   Social Determinants of Health   Financial Resource Strain: Low Risk  (05/25/2022)   Overall Financial Resource Strain (CARDIA)    Difficulty of Paying Living Expenses: Not hard at all  Food Insecurity: No Food Insecurity (05/25/2022)   Hunger Vital Sign    Worried About  Running Out of Food in the Last Year: Never true    Ran Out of Food in the Last Year: Never true  Transportation Needs: No Transportation Needs (05/25/2022)   PRAPARE - Hydrologist (Medical): No    Lack of Transportation (Non-Medical): No  Physical Activity: Inactive (05/25/2022)   Exercise Vital Sign    Days of Exercise per Week: 0 days    Minutes of Exercise per Session: 0 min  Stress: No Stress Concern Present (05/25/2022)   [place]    Feeling of Stress : Not at all  Social Connections: [place] (05/25/2022)   Social Connection and Isolation Panel [NHANES]    Frequency of Communication with Friends and Family: Twice a week    Frequency of Social Gatherings with Friends and Family: Twice a week    Attends Religious Services: More than 4 times per year    Active Member of [employer] or Organizations: Yes    Attends Archivist Meetings: More than 4 times per year    Marital Status: Married  Human resources officer Violence: Not At Risk (05/25/2022)   Humiliation, Afraid, Rape, and Kick questionnaire    Fear of Current or Ex-Partner: No    Emotionally Abused: No    Physically Abused: No    Sexually Abused: No    Past Surgical History:  Procedure Laterality Date   CARDIAC CATHETERIZATION  2008   [place], [place]   CATARACT EXTRACTION W/PHACO Left 07/27/2015   Procedure: CATARACT EXTRACTION PHACO AND INTRAOCULAR LENS PLACEMENT ([place]);  Surgeon: Leandrew Koyanagi, MD;  Location: [place];  Service: Ophthalmology;  Laterality: Left;  TORIC   CATARACT EXTRACTION W/PHACO Right 08/24/2015   Procedure: CATARACT EXTRACTION PHACO AND INTRAOCULAR LENS PLACEMENT (I[facility]);  Surgeon: Leandrew Koyanagi, MD;  Location: [place];  Service: Ophthalmology;  Laterality: Right;  TORIC   COLONOSCOPY WITH PROPOFOL N/A 08/10/2020   Procedure: COLONOSCOPY WITH PROPOFOL;  Surgeon:  Toledo, Benay Pike, MD;  Location: [facility];  Service: Gastroenterology;  Laterality: N/A;   HEMORROIDECTOMY  2010   banding   IR ANGIO INTRA EXTRACRAN SEL COM CAROTID INNOMINATE BILAT MOD SED  10/30/2019   IR ANGIO VERTEBRAL SEL VERTEBRAL UNI R MOD SED  10/30/2019   IR RADIOLOGIST EVAL & MGMT  04/11/2020   IR US GUIDE VASC ACCESS RIGHT  10/30/2019   KIDNEY STONE SURGERY  9/08, 6/15   lithotripsy   LAPAROSCOPIC REVISION VENTRICULAR-PERITONEAL (V-P) SHUNT  01/08/2020   Procedure: LAPAROSCOPIC REVISION VENTRICULAR-PERITONEAL (V-P) SHUNT;  Surgeon: Consuella Lose, MD;  Location: [place];  Service: Neurosurgery;;   PROSTATE BIOPSY  2007   SKIN CANCER EXCISION     TOENAIL TRIMMING  11/2017  Dr. Elvina Mattes   TONSILLECTOMY     VASECTOMY     VENTRICULOPERITONEAL SHUNT Right 01/08/2020   Procedure: SHUNT INSERTION VENTRICULAR-PERITONEAL;  Surgeon: Consuella Lose, MD;  Location: [place];  Service: Neurosurgery;  Laterality: Right;    Family History  Problem Relation Age of Onset   AAA (abdominal aortic aneurysm) Father    Parkinson's disease Mother    Arthritis Mother     Allergies  Allergen Reactions   Bee Venom Anaphylaxis and Hives   Other Other (See Comments)    Pistachios - tickles throat  Pistachios - tickles throat   Tramadol Other (See Comments)    Current Outpatient Medications on File Prior to Visit  Medication Sig Dispense Refill   acetaminophen (TYLENOL) 500 MG tablet Take 1,000 mg by mouth every 8 (eight) hours as needed for moderate pain.     ascorbic acid (VITAMIN C) 500 MG tablet Take 500 mg by mouth daily.     atorvastatin (LIPITOR) 20 MG tablet Take 1 tablet (20 mg total) by mouth daily. for cholesterol. 90 tablet 3   buPROPion (WELLBUTRIN XL) 150 MG 24 hr tablet Take by mouth.     calcium carbonate (TUMS - DOSED IN MG ELEMENTAL CALCIUM) 500 MG chewable tablet Chew 1 tablet by mouth. As needed     ELIQUIS 5 MG TABS tablet TAKE 1 TABLET TWICE A DAY 180 tablet 3    EPINEPHRINE 0.3 mg/0.3 mL IJ SOAJ injection INJECT 0.3 ML (0.3 MG TOTAL) INTO THE MUSCLE AS NEEDED FOR ANAPHYLAXIS 2 each 1   hydrocortisone (ANUSOL-HC) 25 MG suppository Place 1 suppository (25 mg total) rectally 2 (two) times daily. 30 suppository 0   Magnesium Cl-Calcium Carbonate (SLOW-MAG PO) Take 1 tablet by mouth daily.      Magnesium Oxide 500 MG TABS Take by mouth.     Melatonin 1 MG CAPS Take 1 mg by mouth at bedtime as needed (sleep).     memantine (NAMENDA) 5 MG tablet Take 5 mg by mouth 2 (two) times daily.     metoprolol succinate (TOPROL-XL) 50 MG 24 hr tablet Take 1 tablet (50 mg total) by mouth 2 (two) times daily. 180 tablet 3   Multiple Vitamin (MULTIVITAMIN) tablet Take 1 tablet by mouth daily.     Multiple Vitamins-Minerals (PRESERVISION AREDS 2 PO) Take 1 capsule by mouth 2 (two) times daily.      omeprazole (PRILOSEC) 20 MG capsule Take 1 capsule (20 mg total) by mouth 2 (two) times daily before a meal. For heartburn. 180 capsule 1   Polyethyl Glycol-Propyl Glycol (SYSTANE OP) Apply 1 drop to eye 3 (three) times daily as needed (dry eyes).     Potassium Gluconate 550 MG TABS Take 550 mg by mouth daily.     Probiotic Product (PROBIOTIC DAILY PO) Take 1 capsule by mouth daily.      vitamin B-12 (CYANOCOBALAMIN) 1000 MCG tablet Take 1,000 mcg by mouth daily.     diclofenac Sodium (VOLTAREN) 1 % GEL Apply 1 application topically 4 (four) times daily as needed (pain). (Patient not taking: Reported on 07/12/2021)     No current facility-administered medications on file prior to visit.    BP 114/74   Pulse (!) 59   Temp 97.9 F (36.6 C) (Temporal)   Ht 5' 10"$  (1.778 m)   Wt 201 lb (91.2 kg)   SpO2 98%   BMI 28.84 kg/m  Objective:   Physical Exam Constitutional:      Appearance: He is not  ill-appearing.  Cardiovascular:     Rate and Rhythm: Normal rate.  Pulmonary:     Effort: Pulmonary effort is normal.  Abdominal:     General: Bowel sounds are normal.      Palpations: Abdomen is soft.     Tenderness: There is abdominal tenderness in the right lower quadrant. There is no guarding.    Musculoskeletal:     Cervical back: Neck supple.  Skin:    General: Skin is warm and dry.  Neurological:     Mental Status: He is alert and oriented to person, place, and time.           Assessment & Plan:  Right lower quadrant abdominal pain Assessment & Plan: Exam and presentation today not representative of acute appendicitis. Given his history of diverticulitis, coupled with his presenting symptoms, we will treat for such.  Labs received which show slightly elevated monocytes, otherwise negative.  Start Augmentin 875-125 milligrams twice daily x 7 days. We discussed a clear liquid diet.  He will update me early next week. Strict ED precautions provided to patient and his wife.  Orders: -     CBC with Differential/Platelet -     Basic metabolic panel -     DG Abd 2 Views  Foul smelling urine Assessment & Plan: UA today negative.  Encouraged increased intake of water during the day. Await labs and other work up.  Orders: -     POCT Urinalysis Dipstick (Automated)  Gastroesophageal reflux disease, unspecified whether esophagitis present Assessment & Plan: Deteriorated.  Question if evening meds are causing symptoms.  Will hold evening potassium and magnesium to see if this helps.  Checking CBC to rule out any evidence of anemia.   Consider adding famotidine 20 mg in evening vs taking omeprazole before dinner and evening meds.   His wife will update.   Chronic right-sided low back pain without sciatica Assessment & Plan: No alarm signs on exam.  Do suspect his sedentary lifestyle is contributing.  Discussed to try to walk everyday.   Referral placed for outpatient PT.  Orders: -     Ambulatory referral to Physical Therapy        Pleas Koch, NP

## 2022-06-22 NOTE — Patient Instructions (Addendum)
Stop by the lab and xray prior to leaving today. I will notify you of your results once received.   Try a clear liquid diet for today.  Hold magnesium and potassium for now. Update me regarding your heartburn.   You will either be contacted via phone regarding your referral to physical therapy, or you may receive a letter on your MyChart portal from our referral team with instructions for scheduling an appointment. Please let us know if you have not been contacted by anyone within two weeks.  It was a pleasure to see you today!

## 2022-06-22 NOTE — Assessment & Plan Note (Signed)
Deteriorated.  Question if evening meds are causing symptoms.  Will hold evening potassium and magnesium to see if this helps.  Checking CBC to rule out any evidence of anemia.   Consider adding famotidine 20 mg in evening vs taking omeprazole before dinner and evening meds.   His wife will update.

## 2022-06-22 NOTE — Assessment & Plan Note (Signed)
UA today negative.  Encouraged increased intake of water during the day. Await labs and other work up.

## 2022-06-22 NOTE — Assessment & Plan Note (Signed)
No alarm signs on exam.  Do suspect his sedentary lifestyle is contributing.  Discussed to try to walk everyday.   Referral placed for outpatient PT.

## 2022-06-22 NOTE — Assessment & Plan Note (Signed)
Exam and presentation today not representative of acute appendicitis. Given his history of diverticulitis, coupled with his presenting symptoms, we will treat for such.  Labs received which show slightly elevated monocytes, otherwise negative.  Start Augmentin 875-125 milligrams twice daily x 7 days. We discussed a clear liquid diet.  He will update me early next week. Strict ED precautions provided to patient and his wife.

## 2022-06-26 DIAGNOSIS — H353124 Nonexudative age-related macular degeneration, left eye, advanced atrophic with subfoveal involvement: Secondary | ICD-10-CM | POA: Diagnosis not present

## 2022-06-27 DIAGNOSIS — L851 Acquired keratosis [keratoderma] palmaris et plantaris: Secondary | ICD-10-CM | POA: Diagnosis not present

## 2022-06-27 DIAGNOSIS — B351 Tinea unguium: Secondary | ICD-10-CM | POA: Diagnosis not present

## 2022-06-27 DIAGNOSIS — M79675 Pain in left toe(s): Secondary | ICD-10-CM | POA: Diagnosis not present

## 2022-06-27 DIAGNOSIS — M79674 Pain in right toe(s): Secondary | ICD-10-CM | POA: Diagnosis not present

## 2022-07-16 ENCOUNTER — Encounter: Payer: Self-pay | Admitting: Internal Medicine

## 2022-07-16 ENCOUNTER — Ambulatory Visit (INDEPENDENT_AMBULATORY_CARE_PROVIDER_SITE_OTHER): Payer: Medicare Other | Admitting: Internal Medicine

## 2022-07-16 VITALS — BP 128/78 | HR 64 | Temp 98.2°F | Ht 70.0 in | Wt 202.0 lb

## 2022-07-16 DIAGNOSIS — L03011 Cellulitis of right finger: Secondary | ICD-10-CM

## 2022-07-16 DIAGNOSIS — R7303 Prediabetes: Secondary | ICD-10-CM

## 2022-07-16 MED ORDER — DOXYCYCLINE HYCLATE 100 MG PO TABS: 100.0000 mg | ORAL_TABLET | Freq: Two times a day (BID) | ORAL | 0 refills | Status: DC

## 2022-07-16 NOTE — Patient Instructions (Signed)
Please take all new medication as prescribed - the doxycycline  Please continue all other medications as before, and refills have been done if requested.  Please have the pharmacy call with any other refills you may need.  Please keep your appointments with your specialists as you may have planned

## 2022-07-16 NOTE — Assessment & Plan Note (Signed)
Lab Results  Component Value Date   HGBA1C 5.9 12/01/2020   Stable, pt to continue current medical treatment toprol xl 50 bid

## 2022-07-16 NOTE — Assessment & Plan Note (Signed)
Mild to mod, for antibx course - doxycycline 100 bid x 10 days, consider hand surgury if not imrpoved, to f/u any worsening symptoms or concerns

## 2022-07-16 NOTE — Progress Notes (Signed)
Patient ID: Adam Juarez, male   DOB: 1941-04-14, 82 y.o.   MRN: [id number]        Chief Complaint: follow up right index finger infection, preDM       HPI:  Adam Juarez is a 81 y.o. male here with c/o new onset 3 days red, swelling tender to medial aspect fingernail right index finger that seems to rather quickly becoming pus related, but no fever, chills , red streaks or drainage.  Pt denies chest pain, increased sob or doe, wheezing, orthopnea, PND, increased LE swelling, palpitations, dizziness or syncope.   Pt denies polydipsia, polyuria, or new focal neuro s/s.          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 a-fib x 1 episode patient states he "auto corrected" seen at [facility]   GERD (gastroesophageal reflux disease)    Headache    poor posture - none recently   History of kidney stones    Hydrocephalus (HCC)    Hyperlipidemia    Neuropathy    PSVT (paroxysmal supraventricular tachycardia) 2009   Controlled with "breathing process"   Renal stone 9/08, 6/15   Wears dentures    full upper   Past Surgical History:  Procedure Laterality Date   CARDIAC CATHETERIZATION  2008   [place], [place]   CATARACT EXTRACTION [facility] Left 07/27/2015   Procedure: CATARACT EXTRACTION PHACO AND INTRAOCULAR LENS PLACEMENT ([place]);  Surgeon: Leandrew Koyanagi, MD;  Location: [place];  Service: Ophthalmology;  Laterality: Left;  TORIC   CATARACT EXTRACTION W/PHACO Right 08/24/2015   Procedure: CATARACT EXTRACTION PHACO AND INTRAOCULAR LENS PLACEMENT ([facility]);  Surgeon: Leandrew Koyanagi, MD;  Location: [place];  Service:  Ophthalmology;  Laterality: Right;  TORIC   COLONOSCOPY WITH PROPOFOL N/A 08/10/2020   Procedure: COLONOSCOPY WITH PROPOFOL;  Surgeon: Toledo, Benay Pike, MD;  Location: [facility];  Service: Gastroenterology;  Laterality: N/A;   HEMORROIDECTOMY  2010   banding   IR ANGIO INTRA EXTRACRAN SEL COM CAROTID INNOMINATE BILAT MOD SED  10/30/2019   IR ANGIO VERTEBRAL SEL VERTEBRAL UNI R MOD SED  10/30/2019   IR RADIOLOGIST EVAL & MGMT  04/11/2020   IR US GUIDE VASC ACCESS RIGHT  10/30/2019   KIDNEY STONE SURGERY  9/08, 6/15   lithotripsy   LAPAROSCOPIC REVISION VENTRICULAR-PERITONEAL (V-P) SHUNT  01/08/2020   Procedure: LAPAROSCOPIC REVISION VENTRICULAR-PERITONEAL (V-P) SHUNT;  Surgeon: Consuella Lose, MD;  Location: [place];  Service: Neurosurgery;;   PROSTATE BIOPSY  2007   SKIN CANCER EXCISION     TOENAIL TRIMMING  11/2017   Dr. 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 His smoking use included cigarettes. He has a 19.00 pack-year smoking history. He has never used smokeless tobacco. He reports that he does not  drink alcohol and does not use drugs. family history includes AAA (abdominal aortic aneurysm) in his father; Arthritis in his mother; Parkinson's disease in his mother. Allergies  Allergen Reactions   Bee Venom Anaphylaxis and Hives   Other Other (See Comments)    Pistachios - tickles throat  Pistachios - tickles throat   Tramadol Other (See Comments)   Current Outpatient Medications on File Prior to Visit  Medication Sig Dispense Refill   acetaminophen (TYLENOL) 500 MG tablet Take 1,000 mg by mouth every 8 (eight) hours as needed for moderate pain.     amoxicillin-clavulanate (AUGMENTIN) 875-125 MG tablet Take 1 tablet by mouth 2 (two)  times daily. 14 tablet 0   ascorbic acid (VITAMIN C) 500 MG tablet Take 500 mg by mouth daily.     atorvastatin (LIPITOR) 20 MG tablet Take 1 tablet (20 mg total) by mouth daily. for cholesterol. 90 tablet 3   buPROPion (WELLBUTRIN XL) 150 MG 24 hr tablet Take by mouth.     calcium carbonate (TUMS - DOSED IN MG ELEMENTAL CALCIUM) 500 MG chewable tablet Chew 1 tablet by mouth. As needed     diclofenac Sodium (VOLTAREN) 1 % GEL Apply 1 application  topically 4 (four) times daily as needed (pain).     ELIQUIS 5 MG TABS tablet TAKE 1 TABLET TWICE A DAY 180 tablet 3   EPINEPHRINE 0.3 mg/0.3 mL IJ SOAJ injection INJECT 0.3 ML (0.3 MG TOTAL) INTO THE MUSCLE AS NEEDED FOR ANAPHYLAXIS 2 each 1   hydrocortisone (ANUSOL-HC) 25 MG suppository Place 1 suppository (25 mg total) rectally 2 (two) times daily. 30 suppository 0   Magnesium Cl-Calcium Carbonate (SLOW-MAG PO) Take 1 tablet by mouth daily.      Magnesium Oxide 500 MG TABS Take by mouth.     Melatonin 1 MG CAPS Take 1 mg by mouth at bedtime as needed (sleep).     memantine (NAMENDA) 5 MG tablet Take 5 mg by mouth 2 (two) times daily.     metoprolol succinate (TOPROL-XL) 50 MG 24 hr tablet Take 1 tablet (50 mg total) by mouth 2 (two) times daily. 180 tablet 3   Multiple Vitamin (MULTIVITAMIN) tablet Take 1 tablet by mouth daily.     Multiple Vitamins-Minerals (PRESERVISION AREDS 2 PO) Take 1 capsule by mouth 2 (two) times daily.      omeprazole (PRILOSEC) 20 MG capsule Take 1 capsule (20 mg total) by mouth 2 (two) times daily before a meal. For heartburn. 180 capsule 1   Polyethyl Glycol-Propyl Glycol (SYSTANE OP) Apply 1 drop to eye 3 (three) times daily as needed (dry eyes).     Potassium Gluconate 550 MG TABS Take 550 mg by mouth daily.     Probiotic Product (PROBIOTIC DAILY PO) Take 1 capsule by mouth daily.      vitamin B-12 (CYANOCOBALAMIN) 1000 MCG tablet Take 1,000 mcg by mouth daily.     No current facility-administered medications on file  prior to visit.        ROS:  All others reviewed and negative.  Objective        PE:  BP 128/78 (BP Location: Right Arm, Patient Position: Sitting, Cuff Size: Large)   Pulse 64   Temp 98.2 F (36.8 C) (Oral)   Ht '5\' 10"'$  (1.778 m)   Wt 202 lb (91.6 kg)   SpO2 95%   BMI 28.98 kg/m                 Constitutional:  Pt appears in NAD               HENT: Head: NCAT.                Right Ear: External ear normal.                 Left Ear: External ear normal.                Eyes: . Pupils are equal, round, and reactive to light. Conjunctivae and EOM are normal               Nose: without d/c or deformity               Neck: Neck supple. Gross normal ROM               Cardiovascular: Normal rate and regular rhythm.                 Pulmonary/Chest: Effort normal and breath sounds without rales or wheezing.                Abd:  Soft, NT, ND, + BS, no organomegaly               Neurological: Pt is alert. At baseline orientation, motor grossly intact               Skin:  LE edema - none, medial aspect right index fingernail with tender red swelling with small underlying pus, no drainage               Psychiatric: Pt behavior is normal without agitation   Micro: none  Cardiac tracings I have personally interpreted today:  none  Pertinent Radiological findings (summarize): none   Lab Results  Component Value Date   WBC 4.0 06/22/2022   HGB 14.6 06/22/2022   HCT 42.6 06/22/2022   PLT 133.0 (L) 06/22/2022   GLUCOSE 105 (H) 06/22/2022   CHOL 124 09/13/2021   TRIG 262 (H) 09/13/2021   HDL 32 (L) 09/13/2021   LDLDIRECT 41.0 07/22/2020   LDLCALC 40 09/13/2021   ALT 14 01/14/2022   AST 23 01/14/2022   NA 138 06/22/2022   K 4.3 06/22/2022   CL 103 06/22/2022   CREATININE 0.90 06/22/2022   BUN 15 06/22/2022   CO2 25 06/22/2022   TSH 1.70 07/22/2020   PSA 4.63 (H) 07/22/2020   INR 1.2 01/14/2022   HGBA1C 5.9 12/01/2020   Assessment/Plan:  Adam Juarez is a 82 y.o. White or  Caucasian [1] male with  has a past medical history of Arthritis, Cancer ([place]) (1991, 2011), Chicken pox, Depression, Dysrhythmia (09/2013), GERD (gastroesophageal reflux disease), Headache, History of kidney stones, Hydrocephalus ([place]), Hyperlipidemia, Neuropathy, PSVT (paroxysmal supraventricular tachycardia) (2009), Renal stone (9/08, 6/15), and Wears dentures.  Paronychia of right index finger Mild to mod, for antibx course - doxycycline 100 bid x 10 days, consider hand surgury if not imrpoved, to f/u any worsening symptoms or concerns  Prediabetes Lab Results  Component Value Date   HGBA1C 5.9 12/01/2020   Stable, pt to continue current medical treatment toprol xl 50 bid  Followup: Return if symptoms worsen or fail to improve.  Cathlean Cower, MD 07/16/2022 8:08 PM [facility]

## 2022-07-29 NOTE — Progress Notes (Unsigned)
    Roanna Reaves T. Wilman Tucker, MD, [place] at [facility] [place], [address]  Phone: [phone]  FAX: [phone]  Kaedin Horkey - 82 y.o. male  MRN [id number]  Date of Birth: 12/12/1940  Date: 07/30/2022  PCP: Pleas Koch, NP  Referral: Pleas Koch, NP  No chief complaint on file.  Subjective:   Khang Holeman is a 82 y.o. very pleasant male patient with There is no height or weight on file to calculate BMI. who presents with the following:  He is a pleasant 82 year old patient who I saw once 5 years ago for a trigger finger, and he presents today with some ongoing back pain.    Review of Systems is noted in the HPI, as appropriate  Objective:   There were no vitals taken for this visit.  GEN: No acute distress; alert,appropriate. PULM: Breathing comfortably in no respiratory distress PSYCH: Normally interactive.   Laboratory and Imaging Data:  Assessment and Plan:   ***

## 2022-07-30 ENCOUNTER — Encounter: Payer: Self-pay | Admitting: Family Medicine

## 2022-07-30 ENCOUNTER — Ambulatory Visit (INDEPENDENT_AMBULATORY_CARE_PROVIDER_SITE_OTHER): Payer: Medicare Other | Admitting: Family Medicine

## 2022-07-30 VITALS — BP 90/60 | HR 64 | Temp 97.2°F | Ht 70.0 in | Wt 200.0 lb

## 2022-07-30 DIAGNOSIS — G8929 Other chronic pain: Secondary | ICD-10-CM

## 2022-07-30 DIAGNOSIS — M545 Low back pain, unspecified: Secondary | ICD-10-CM

## 2022-07-30 DIAGNOSIS — M5137 Other intervertebral disc degeneration, lumbosacral region: Secondary | ICD-10-CM

## 2022-07-30 MED ORDER — PREDNISONE 20 MG PO TABS: ORAL_TABLET | ORAL | 0 refills | Status: DC

## 2022-08-07 DIAGNOSIS — H353124 Nonexudative age-related macular degeneration, left eye, advanced atrophic with subfoveal involvement: Secondary | ICD-10-CM | POA: Diagnosis not present

## 2022-08-07 DIAGNOSIS — Z961 Presence of intraocular lens: Secondary | ICD-10-CM | POA: Diagnosis not present

## 2022-08-07 DIAGNOSIS — H353112 Nonexudative age-related macular degeneration, right eye, intermediate dry stage: Secondary | ICD-10-CM | POA: Diagnosis not present

## 2022-08-13 ENCOUNTER — Encounter: Payer: Self-pay | Admitting: Family Medicine

## 2022-08-13 DIAGNOSIS — G8929 Other chronic pain: Secondary | ICD-10-CM

## 2022-08-13 DIAGNOSIS — M5137 Other intervertebral disc degeneration, lumbosacral region: Secondary | ICD-10-CM

## 2022-08-21 ENCOUNTER — Telehealth: Payer: Self-pay | Admitting: Primary Care

## 2022-08-21 NOTE — Telephone Encounter (Signed)
Patient would like to know if referral that was sent out for PT can be sent to [facility] in [place] instead? Fax number: [phone]

## 2022-08-21 NOTE — Telephone Encounter (Signed)
I will change the referral and make the patient aware.

## 2022-08-27 ENCOUNTER — Other Ambulatory Visit: Payer: Self-pay | Admitting: *Deleted

## 2022-08-27 ENCOUNTER — Encounter: Payer: Self-pay | Admitting: Cardiovascular Disease

## 2022-08-27 DIAGNOSIS — I714 Abdominal aortic aneurysm, without rupture, unspecified: Secondary | ICD-10-CM

## 2022-08-27 DIAGNOSIS — I700 Atherosclerosis of aorta: Secondary | ICD-10-CM

## 2022-08-28 DIAGNOSIS — H353124 Nonexudative age-related macular degeneration, left eye, advanced atrophic with subfoveal involvement: Secondary | ICD-10-CM | POA: Diagnosis not present

## 2022-08-31 DIAGNOSIS — R4189 Other symptoms and signs involving cognitive functions and awareness: Secondary | ICD-10-CM | POA: Diagnosis not present

## 2022-08-31 DIAGNOSIS — R569 Unspecified convulsions: Secondary | ICD-10-CM | POA: Diagnosis not present

## 2022-08-31 DIAGNOSIS — I671 Cerebral aneurysm, nonruptured: Secondary | ICD-10-CM | POA: Diagnosis not present

## 2022-08-31 DIAGNOSIS — G912 (Idiopathic) normal pressure hydrocephalus: Secondary | ICD-10-CM | POA: Diagnosis not present

## 2022-08-31 DIAGNOSIS — I480 Paroxysmal atrial fibrillation: Secondary | ICD-10-CM | POA: Diagnosis not present

## 2022-08-31 DIAGNOSIS — Z982 Presence of cerebrospinal fluid drainage device: Secondary | ICD-10-CM | POA: Diagnosis not present

## 2022-09-04 DIAGNOSIS — M545 Low back pain, unspecified: Secondary | ICD-10-CM | POA: Diagnosis not present

## 2022-09-04 DIAGNOSIS — R262 Difficulty in walking, not elsewhere classified: Secondary | ICD-10-CM | POA: Diagnosis not present

## 2022-09-07 DIAGNOSIS — H9311 Tinnitus, right ear: Secondary | ICD-10-CM | POA: Diagnosis not present

## 2022-09-07 DIAGNOSIS — H903 Sensorineural hearing loss, bilateral: Secondary | ICD-10-CM | POA: Diagnosis not present

## 2022-09-13 DIAGNOSIS — R262 Difficulty in walking, not elsewhere classified: Secondary | ICD-10-CM | POA: Diagnosis not present

## 2022-09-13 DIAGNOSIS — M545 Low back pain, unspecified: Secondary | ICD-10-CM | POA: Diagnosis not present

## 2022-09-19 ENCOUNTER — Ambulatory Visit: Payer: Medicare Other | Attending: Cardiovascular Disease

## 2022-09-19 DIAGNOSIS — I714 Abdominal aortic aneurysm, without rupture, unspecified: Secondary | ICD-10-CM | POA: Insufficient documentation

## 2022-09-19 DIAGNOSIS — M545 Low back pain, unspecified: Secondary | ICD-10-CM | POA: Diagnosis not present

## 2022-09-19 DIAGNOSIS — I700 Atherosclerosis of aorta: Secondary | ICD-10-CM | POA: Insufficient documentation

## 2022-09-19 DIAGNOSIS — R262 Difficulty in walking, not elsewhere classified: Secondary | ICD-10-CM | POA: Diagnosis not present

## 2022-09-21 ENCOUNTER — Emergency Department
Admission: EM | Admit: 2022-09-21 | Discharge: 2022-09-21 | Disposition: A | Discharge status: Home / Self Care | Payer: Medicare Other | Attending: Student in an Organized Health Care Education/Training Program | Admitting: Student in an Organized Health Care Education/Training Program

## 2022-09-21 ENCOUNTER — Emergency Department: Payer: Medicare Other

## 2022-09-21 DIAGNOSIS — R1031 Right lower quadrant pain: Secondary | ICD-10-CM | POA: Diagnosis not present

## 2022-09-21 LAB — COMPREHENSIVE METABOLIC PANEL
ALT: 16 U/L (ref 0–44)
AST: 23 U/L (ref 15–41)
Albumin: 4.3 g/dL (ref 3.5–5.0)
Alkaline Phosphatase: 68 U/L (ref 38–126)
Anion gap: 9 (ref 5–15)
BUN: 20 mg/dL (ref 8–23)
CO2: 23 mmol/L (ref 22–32)
Calcium: 9.3 mg/dL (ref 8.9–10.3)
Chloride: 107 mmol/L (ref 98–111)
Creatinine, Ser: 0.81 mg/dL (ref 0.61–1.24)
GFR, Estimated: >60 mL/min (ref >60)
Glucose, Bld: 102 mg/dL — ABNORMAL HIGH (ref 70–99)
Potassium: 4 mmol/L (ref 3.5–5.1)
Sodium: 139 mmol/L (ref 135–145)
Total Bilirubin: 1.5 mg/dL — ABNORMAL HIGH (ref 0.3–1.2)
Total Protein: 7 g/dL (ref 6.5–8.1)

## 2022-09-21 LAB — CBC
HCT: 42.8 % (ref 39.0–52.0)
Hemoglobin: 14.6 g/dL (ref 13.0–17.0)
MCH: 33.6 pg (ref 26.0–34.0)
MCHC: 34.1 g/dL (ref 30.0–36.0)
MCV: 98.4 fL (ref 80.0–100.0)
Platelets: 119 10*3/uL — ABNORMAL LOW (ref 150–400)
RBC: 4.35 MIL/uL (ref 4.22–5.81)
RDW: 13.2 % (ref 11.5–15.5)
WBC: 4.3 10*3/uL (ref 4.0–10.5)
nRBC: 0 % (ref 0.0–0.2)

## 2022-09-21 LAB — URINALYSIS, ROUTINE W REFLEX MICROSCOPIC
Bilirubin Urine: NEGATIVE
Glucose, UA: NEGATIVE mg/dL
Hgb urine dipstick: NEGATIVE
Ketones, ur: NEGATIVE mg/dL
Leukocytes,Ua: NEGATIVE
Nitrite: NEGATIVE
Protein, ur: NEGATIVE mg/dL
Specific Gravity, Urine: 1.023 (ref 1.005–1.030)
pH: 6 (ref 5.0–8.0)

## 2022-09-21 LAB — LIPASE, BLOOD: Lipase: 33 U/L (ref 11–51)

## 2022-09-21 MED ORDER — IOHEXOL 300 MG/ML  SOLN
100.0000 mL | Freq: Once | INTRAMUSCULAR | Status: AC | PRN
  Administered 2022-09-21: 100 mL via INTRAVENOUS

## 2022-09-21 NOTE — ED Notes (Signed)
Patient transported to CT 

## 2022-09-21 NOTE — ED Provider Notes (Signed)
[facility] Provider Note    Event Date/Time   First MD Initiated Contact with Patient 09/21/22 1240     (approximate)   History   Abdominal Pain   HPI  Adam Juarez is a 82 y.o. male history of VP shunt as well as aneurysm presents to the ER for evaluation of intermittent right lower quadrant abdominal pain.  No previous other intra-abdominal surgeries.  No fevers or chills.  States the pain is moderate.     Physical Exam   Triage Vital Signs: ED Triage Vitals  Enc Vitals Group     BP 09/21/22 1118 139/80     Pulse Rate 09/21/22 1118 62     Resp 09/21/22 1118 18     Temp 09/21/22 1118 98.1 F (36.7 C)     Temp Source 09/21/22 1118 Oral     SpO2 09/21/22 1118 95 %     Weight 09/21/22 1119 190 lb (86.2 kg)     Height --      Head Circumference --      Peak Flow --      Pain Score 09/21/22 1119 3     Pain Loc --      Pain Edu? --      Excl. in GC? --     Most recent vital signs: Vitals:   09/21/22 1118  BP: 139/80  Pulse: 62  Resp: 18  Temp: 98.1 F (36.7 C)  SpO2: 95%     Constitutional: Alert  Eyes: Conjunctivae are normal.  Head: Atraumatic. Nose: No congestion/rhinnorhea. Mouth/Throat: Mucous membranes are moist.   Neck: Painless ROM.  Cardiovascular:   Good peripheral circulation. Respiratory: Normal respiratory effort.  No retractions.  Gastrointestinal: Soft and nontender.  Musculoskeletal:  no deformity Neurologic:  MAE spontaneously. No gross focal neurologic deficits are appreciated.  Skin:  Skin is warm, dry and intact. No rash noted. Psychiatric: Mood and affect are normal. Speech and behavior are normal.    ED Results / Procedures / Treatments   Labs (all labs ordered are listed, but only abnormal results are displayed) Labs Reviewed  COMPREHENSIVE METABOLIC PANEL - Abnormal; Notable for the following components:      Result Value   Glucose, Bld 102 (*)    Total Bilirubin 1.5 (*)    All other components  within normal limits  CBC - Abnormal; Notable for the following components:   Platelets 119 (*)    All other components within normal limits  URINALYSIS, ROUTINE W REFLEX MICROSCOPIC - Abnormal; Notable for the following components:   Color, Urine YELLOW (*)    APPearance CLEAR (*)    All other components within normal limits  LIPASE, BLOOD     EKG     RADIOLOGY Please see ED Course for my review and interpretation.  I personally reviewed all radiographic images ordered to evaluate for the above acute complaints and reviewed radiology reports and findings.  These findings were personally discussed with the patient.  Please see medical record for radiology report.    PROCEDURES:  Critical Care performed: No  Procedures   MEDICATIONS ORDERED IN ED: Medications  iohexol (OMNIPAQUE) 300 MG/ML solution 100 mL (100 mLs Intravenous Contrast Given 09/21/22 1328)     IMPRESSION / MDM / ASSESSMENT AND PLAN / ED COURSE  I reviewed the triage vital signs and the nursing notes.  Differential diagnosis includes, but is not limited to, appendicitis, colitis, diverticulitis, VP shunt complication, peritonitis, stone, pyelonephritis, aneurysm  Patient presenting to the ER for evaluation of symptoms as described above.  Based on symptoms, risk factors and considered above differential, this presenting complaint could reflect a potentially life-threatening illness therefore the patient will be placed on continuous pulse oximetry and telemetry for monitoring.  Laboratory evaluation will be sent to evaluate for the above complaints.  Patient clinically very well-appearing but given abdominal pain age and risk factors will order CT to further evaluate.   Clinical Course as of 09/21/22 1423  Fri Sep 21, 2022  1343 CT imaging on my review and interpretation without evidence of perforation.  Appendix looks normal.  Will await formal radiology report [PR]  1421  Patient reassessed.  CT imaging reassuring without acute abnormality.  He is aware of the aneurysm and has annual follow-up for this.  He is pain-free.  Do not feel that further diagnostic testing clinically indicated.  We discussed strict return precautions.  Patient agreeable plan. [PR]    Clinical Course User Index [PR] Willy Eddy, MD     FINAL CLINICAL IMPRESSION(S) / ED DIAGNOSES   Final diagnoses:  Right lower quadrant abdominal pain     Rx / DC Orders   ED Discharge Orders     None        Note:  This document was prepared using Dragon voice recognition software and may include unintentional dictation errors.    Willy Eddy, MD 09/21/22 [phone]

## 2022-09-21 NOTE — ED Triage Notes (Signed)
First nurse note: Brought over from [facility]. RLQ pain X3 days and reports sharpness and bubbling.   History diverticulitis and kidney stones

## 2022-09-21 NOTE — ED Notes (Signed)
Pt provided discharge instructions and prescription information. Pt was given the opportunity to ask questions and questions were answered.   

## 2022-09-21 NOTE — ED Triage Notes (Signed)
Pt sts that he has been having RLQ pain for the last two days. Pt denies any N/V/D. Pt sts that he still has his appendix's.

## 2022-09-24 DIAGNOSIS — M545 Low back pain, unspecified: Secondary | ICD-10-CM | POA: Diagnosis not present

## 2022-09-24 DIAGNOSIS — R262 Difficulty in walking, not elsewhere classified: Secondary | ICD-10-CM | POA: Diagnosis not present

## 2022-09-26 ENCOUNTER — Other Ambulatory Visit: Payer: Self-pay

## 2022-09-26 DIAGNOSIS — L578 Other skin changes due to chronic exposure to nonionizing radiation: Secondary | ICD-10-CM | POA: Diagnosis not present

## 2022-09-26 DIAGNOSIS — L820 Inflamed seborrheic keratosis: Secondary | ICD-10-CM | POA: Diagnosis not present

## 2022-09-26 DIAGNOSIS — I714 Abdominal aortic aneurysm, without rupture, unspecified: Secondary | ICD-10-CM

## 2022-09-26 DIAGNOSIS — L538 Other specified erythematous conditions: Secondary | ICD-10-CM | POA: Diagnosis not present

## 2022-10-01 ENCOUNTER — Telehealth: Payer: Self-pay | Admitting: Pharmacist

## 2022-10-01 DIAGNOSIS — M79674 Pain in right toe(s): Secondary | ICD-10-CM | POA: Diagnosis not present

## 2022-10-01 DIAGNOSIS — I480 Paroxysmal atrial fibrillation: Secondary | ICD-10-CM

## 2022-10-01 DIAGNOSIS — M79675 Pain in left toe(s): Secondary | ICD-10-CM | POA: Diagnosis not present

## 2022-10-01 DIAGNOSIS — I700 Atherosclerosis of aorta: Secondary | ICD-10-CM

## 2022-10-01 DIAGNOSIS — L851 Acquired keratosis [keratoderma] palmaris et plantaris: Secondary | ICD-10-CM | POA: Diagnosis not present

## 2022-10-01 DIAGNOSIS — B351 Tinea unguium: Secondary | ICD-10-CM | POA: Diagnosis not present

## 2022-10-01 NOTE — Telephone Encounter (Signed)
PharmD reviewed patient chart to assess eligibility for [facility] services. Patient was determined to be a good candidate for the program given the complexity of the medication regimen and/or overall risk for hospitalization and increased utilization.  Referral entered in order to outreach patient and offer appointment with PharmD. Referral cosigned to PCP.  

## 2022-10-09 ENCOUNTER — Telehealth: Payer: Self-pay

## 2022-10-09 ENCOUNTER — Ambulatory Visit: Payer: Medicare Other | Admitting: Pharmacist

## 2022-10-09 NOTE — Progress Notes (Unsigned)
Care Management & Coordination Services Pharmacy Note  10/09/2022 Name:  Adam Juarez MRN:  [id number] DOB:  Dec 11, 1940  Summary: ***  Recommendations/Changes made from today's visit: ***  Follow up plan: -Health Concierge will call patient *** -Pharmacist follow up televisit scheduled for ***    Subjective: Adam Juarez is an 82 y.o. year old male who is a primary patient of [name], NP.  The care coordination team was consulted for assistance with disease management and care coordination needs.    Engaged with patient face to face for initial visit. Patient lives at home with his wife.  Recent office visits: 07/30/22 Dr Patsy Lager OV :back pain - rx prednisone x 10 days  06/22/22 NP Mayra Reel OV: RLQ pain - tx as diverticulitis. Rx Augmentin. D/c lexapro (not taking). GERD -  hold evening potassium and Mg. Referral to PT for back pain.  Recent consult visits: 10/01/22 Podiatry - mycotic toenails, neuropathy  08/31/22 Dr Sherryll Burger (Neurology): cognitive impairment, VP shunt. Continue memantine. Episodic spells possible panic attacks vs seizures, did not tolerate lamotrigine.   07/16/22 Dr Jonny Ruiz (IM): finger infection. Rx doxycycline  09/13/21 Dr Mariah Milling (Cardiology): ordered zio monitor to exclude arrhythmia as cause for sz-like symptoms  Hospital visits: 09/21/22 ED visit [facility]): RLQ pain. CT wnl.   Objective:  Lab Results  Component Value Date   CREATININE 0.81 09/21/2022   BUN 20 09/21/2022   GFR 80.70 06/22/2022   GFRNONAA >60 09/21/2022   GFRAA >60 01/09/2020   NA 139 09/21/2022   K 4.0 09/21/2022   CALCIUM 9.3 09/21/2022   CO2 23 09/21/2022   GLUCOSE 102 (H) 09/21/2022    Lab Results  Component Value Date/Time   HGBA1C 5.9 12/01/2020 08:30 AM   HGBA1C 5.9 07/22/2020 08:34 AM   GFR 80.70 06/22/2022 12:11 PM   GFR 82.14 12/01/2020 08:30 AM    Last diabetic Eye exam: No results found for: "HMDIABEYEEXA"  Last diabetic Foot exam: No results found for:  "HMDIABFOOTEX"   Lab Results  Component Value Date   CHOL 124 09/13/2021   HDL 32 (L) 09/13/2021   LDLCALC 40 09/13/2021   LDLDIRECT 41.0 07/22/2020   TRIG 262 (H) 09/13/2021   CHOLHDL 3.9 09/13/2021       Latest Ref Rng & Units 09/21/2022   11:22 AM 01/14/2022    4:28 AM 08/04/2021   12:10 PM  Hepatic Function  Total Protein 6.5 - 8.1 g/dL 7.0  6.9  7.0   Albumin 3.5 - 5.0 g/dL 4.3  4.0  4.5   AST 15 - 41 U/L 23  23  19    ALT 0 - 44 U/L 16  14  15    Alk Phosphatase 38 - 126 U/L 68  65  62   Total Bilirubin 0.3 - 1.2 mg/dL 1.5  1.0  1.3   Bilirubin, Direct 0.0 - 0.3 mg/dL   0.2     Lab Results  Component Value Date/Time   TSH 1.70 07/22/2020 08:34 AM   TSH 1.93 07/15/2019 12:48 PM   FREET4 0.80 03/13/2018 11:11 AM       Latest Ref Rng & Units 09/21/2022   11:22 AM 06/22/2022   12:11 PM 01/14/2022    4:28 AM  CBC  WBC 4.0 - 10.5 K/uL 4.3  4.0  4.1   Hemoglobin 13.0 - 17.0 g/dL 40.9  81.1  91.4   Hematocrit 39.0 - 52.0 % 42.8  42.6  40.4   Platelets 150 - 400 K/uL 119  133.0  120     Lab Results  Component Value Date/Time   VITAMINB12 478 07/15/2019 12:48 PM   VITAMINB12 243 03/13/2018 11:11 AM    Clinical ASCVD: Yes  The ASCVD Risk score (Arnett DK, et al., 2019) failed to calculate for the following reasons:   The 2019 ASCVD risk score is only valid for ages 76 to 30    CHA2DS2/VAS Stroke Risk Points  Current as of 4 days ago     2 >= 2 Points: High Risk       06/22/2022   11:33 AM 05/24/2021   11:24 AM 07/21/2020    2:58 PM  Depression screen PHQ 2/9  Decreased Interest 1 0 0  Down, Depressed, Hopeless 1 0 3  PHQ - 2 Score 2 0 3  Altered sleeping 1  0  Tired, decreased energy 2  3  Change in appetite 0  0  Feeling bad or failure about yourself  0  0  Trouble concentrating 0  3  Moving slowly or fidgety/restless 0  3  Suicidal thoughts 0  0  PHQ-9 Score 5  12  Difficult doing work/chores Not difficult at all  Somewhat difficult       07/21/2020     3:02 PM  GAD 7 : Generalized Anxiety Score  Nervous, Anxious, on Edge 0  Control/stop worrying 0  Worry too much - different things 0  Trouble relaxing 0  Restless 0  Easily annoyed or irritable 1  Afraid - awful might happen 0  Total GAD 7 Score 1     Social History   Tobacco Use  Smoking Status Former   Packs/day: 0.50   Years: 38.00   Additional pack years: 0.00   Total pack years: 19.00   Types: Cigarettes   Quit date: 07/13/1997   Years since quitting: 25.2  Smokeless Tobacco Never   BP Readings from Last 3 Encounters:  09/21/22 134/89  07/30/22 90/60  07/16/22 128/78   Pulse Readings from Last 3 Encounters:  09/21/22 64  07/30/22 64  07/16/22 64   Wt Readings from Last 3 Encounters:  09/21/22 190 lb (86.2 kg)  07/30/22 200 lb (90.7 kg)  07/16/22 202 lb (91.6 kg)   BMI Readings from Last 3 Encounters:  09/21/22 27.26 kg/m  07/30/22 28.70 kg/m  07/16/22 28.98 kg/m    Allergies  Allergen Reactions   Bee Venom Anaphylaxis and Hives   Other Other (See Comments)    Pistachios - tickles throat  Pistachios - tickles throat   Tramadol Other (See Comments)    Medications Reviewed Today     Reviewed by Damita Lack, CMA (Certified Medical Assistant) on 07/30/22 at 1426  Med List Status: <None>   Medication Order Taking? Sig Documenting Provider Last Dose Status Informant  acetaminophen (TYLENOL) 500 MG tablet [id number]  Take 1,000 mg by mouth every 8 (eight) hours as needed for moderate pain. [provider]  Active Self  amoxicillin-clavulanate (AUGMENTIN) 875-125 MG tablet [id number]  Take 1 tablet by mouth 2 (two) times daily. [name], NP  Active   ascorbic acid (VITAMIN C) 500 MG tablet [id number]  Take 500 mg by mouth daily. [provider]  Active Self  atorvastatin (LIPITOR) 20 MG tablet [id number]  Take 1 tablet (20 mg total) by mouth daily. for cholesterol. [name], NP  Active   buPROPion (WELLBUTRIN XL)  150 MG 24 hr tablet [id number]  Take by mouth. [provider]  Active  calcium carbonate (TUMS - DOSED IN MG ELEMENTAL CALCIUM) 500 MG chewable tablet [id number]  Chew 1 tablet by mouth. As needed [provider]  Active   diclofenac Sodium (VOLTAREN) 1 % GEL [id number]  Apply 1 application  topically 4 (four) times daily as needed (pain). [provider]  Active Self  doxycycline (VIBRA-TABS) 100 MG tablet [id number]  Take 1 tablet (100 mg total) by mouth 2 (two) times daily. Corwin Levins, MD  Active   ELIQUIS 5 MG TABS tablet [id number]  TAKE 1 TABLET TWICE A DAY Gollan, Tollie Pizza, MD  Active   EPINEPHRINE 0.3 mg/0.3 mL IJ SOAJ injection [id number]  INJECT 0.3 ML (0.3 MG TOTAL) INTO THE MUSCLE AS NEEDED FOR ANAPHYLAXIS Gweneth Dimitri, MD  Active Self  hydrocortisone (ANUSOL-HC) 25 MG suppository [id number]  Place 1 suppository (25 mg total) rectally 2 (two) times daily. [name], NP  Active Self  Magnesium Cl-Calcium Carbonate (SLOW-MAG PO) [id number]  Take 1 tablet by mouth daily.  [provider]  Active Self  Magnesium Oxide 500 MG TABS [id number]  Take by mouth. [provider]  Active Self  Melatonin 1 MG CAPS [id number]  Take 1 mg by mouth at bedtime as needed (sleep). [provider]  Active Self  memantine (NAMENDA) 5 MG tablet [id number]  Take 5 mg by mouth 2 (two) times daily. [provider]  Active Self  metoprolol succinate (TOPROL-XL) 50 MG 24 hr tablet [id number]  Take 1 tablet (50 mg total) by mouth 2 (two) times daily. Antonieta Iba, MD  Active   Multiple Vitamin (MULTIVITAMIN) tablet [id number]  Take 1 tablet by mouth daily. [provider]  Active Self  Multiple Vitamins-Minerals (PRESERVISION AREDS 2 PO) [id number]  Take 1 capsule by mouth 2 (two) times daily.  [provider]  Active Self  omeprazole (PRILOSEC) 20 MG capsule [id number]  Take 1 capsule (20 mg total) by mouth 2 (two) times daily  before a meal. For heartburn. [name], NP  Active   Polyethyl Glycol-Propyl Glycol (SYSTANE OP) [id number]  Apply 1 drop to eye 3 (three) times daily as needed (dry eyes). [provider]  Active Self  Potassium Gluconate 550 MG TABS [id number]  Take 550 mg by mouth daily. [provider]  Active Self  Probiotic Product (PROBIOTIC DAILY PO) [id number]  Take 1 capsule by mouth daily.  [provider]  Active Self  vitamin B-12 (CYANOCOBALAMIN) 1000 MCG tablet [id number]  Take 1,000 mcg by mouth daily. [provider]  Active Self            SDOH:  (Social Determinants of Health) assessments and interventions performed: {yes/no:20286} SDOH Interventions    Flowsheet Row Clinical Support from 05/25/2022 in [facility] Visit from 11/17/2021 in [facility] Clinical Support from 01/10/2017 in [facility] [place]  SDOH Interventions     Food Insecurity Interventions Intervention Not Indicated -- --  Housing Interventions Intervention Not Indicated -- --  Transportation Interventions Intervention Not Indicated -- --  Utilities Interventions Intervention Not Indicated -- --  Alcohol Usage Interventions Intervention Not Indicated (Score <7) -- --  Depression Interventions/Treatment  -- Medication --  [pt is being referred to PCP for further evaluation]  Financial Strain Interventions Intervention Not Indicated -- --  Physical Activity Interventions Patient Refused -- --  Stress Interventions Intervention Not Indicated -- --  Social Connections Interventions Intervention  Not Indicated -- --       Medication Assistance: {MEDASSISTANCEINFO:25044}  Medication Access: Name and location of current pharmacy:  [facility] - [place], [place] - [address] [place] [address] Phone: [phone] Fax:  [phone]  Within the past 30 days, how often has patient missed a dose of medication? *** Is a pillbox or other method used to improve adherence? {YES/NO:21197} Factors that may affect medication adherence? {CHL DESC; BARRIERS:21522} Are meds synced by current pharmacy? {YES/NO:21197} Are meds delivered by current pharmacy? {YES/NO:21197} Does patient experience delays in picking up medications due to transportation concerns? {YES/NO:21197}  Compliance/Adherence/Medication fill history: Care Gaps: None  Star-Rating Drugs: Atorvastatin - PDC 100%   Assessment/Plan   Atrial Fibrillation (Goal: prevent stroke and major bleeding) -{US controlled/uncontrolled:25276} -CHADSVASC: 2; Zio 09/2021 w 4% Afib burden -Current treatment: Metoprolol succinate 50 mg daily Eliquis 5 mg BID -Medications previously tried: *** -Home BP and HR readings: ***  -Counseled on {CCMAFIBCOUNSELING:25120} -{CCMPHARMDINTERVENTION:25122}  Hyperlipidemia: (LDL goal < 70) -{US controlled/uncontrolled:25276} - LDL 40 (09/2021) -Hx aortic atherosclerosis, PAD -Current treatment: Atorvastatin 20 mg daily -Medications previously tried: ***  -Current dietary patterns: *** -Current exercise habits: *** -Educated on {CCM HLD Counseling:25126} -{CCMPHARMDINTERVENTION:25122}  Cognitive impairment (Goal: ***) -{US controlled/uncontrolled:25276} -Hx normal pressure hydrocephalus, s/p VP shunt. Follows with neurology -Current treatment  Memantine 5 mg BID -Medications previously tried: ***  -{CCMPHARMDINTERVENTION:25122}  Depression/Anxiety (Goal: ***) -{US controlled/uncontrolled:25276} -PHQ9: 5 (06/2022) -GAD7: 1 (06/2022) -Connected with Neurology for mental health support -Current treatment: Bupropion XL 150 mg daily -Medications previously tried/failed: escitalopram -Educated on {CCM mental health counseling:25127} -{CCMPHARMDINTERVENTION:25122}  GERD (Goal: minimize symptoms of reflux ) -{US  controlled/uncontrolled:25276} -Triggering factors: *** -Hx of Bleeds/ulcers: {yes/no:20286} -Current treatment  Omeprazole 20 mg BID -Medications previously tried: none reported  -Counseled on small meals, elevating head, and sleeping on left side -{CCMPHARMDINTERVENTION:25122}  Health Maintenance -Vaccine gaps: Shingrix -Chronic back pain: tylenol -Bee allergy: Epi-Pen on hand -OTC: Vitamin C, Magnesium oxide, Slow-Mag, Potassium, MVA, Preservision, Melatonin   Al Corpus, PharmD, BCACP, CPP Clinical Pharmacist Practitioner [facility] at [facility] [phone]

## 2022-10-09 NOTE — Patient Instructions (Signed)
Visit Information  Phone number for Pharmacist: [phone]  Thank you for meeting with me to discuss your medications! I look forward to working with you to achieve your health care goals. Below is a summary of what we talked about during the visit:  Keep your medications the same for now.  Take omeprazole 30-60 minutes before eating. You can also try Pepcid 10-15 min before a meal that you know will cause heartburn.  I will talk to your neurologist about bupropion.   Al Corpus, PharmD, BCACP Clinical Pharmacist [facility] [phone]

## 2022-10-09 NOTE — Progress Notes (Signed)
Care Management & Coordination Services Pharmacy Team  Reason for Encounter: Appointment Reminder  Contacted patient to confirm in office appointment with Al Corpus , PharmD on 10/09/22 at 9:00. Unsuccessful outreach. Left voicemail for patient to return call. X2  Have you seen any other providers since your last visit with PCP? Yes   podiatry  Chart review:  Recent office visits:  07/30/22-Spencer Coplane,MD(fam med)- back pain, short course oral steroids 2/9/24Natalia Leatherwood Clark,NP-(PCP)- right lower quadrant pain, labs,Consider adding famotidine 20 mg in evening vs taking omeprazole before dinner and evening meds.  Referral for PT   Recent consult visits:  10/01/22- [facility]- mycotic toenails  08/31/22-Hemang Shah,MD(neuro)- f/u memory loss ,f/u 1 year  07/16/22-James John,MD(int med)- finger infection-doxycycline 100mg  bid for 10 days  Hospital visits:  09/21/22- [place] [facility]- right lower quadrant pain- labs- no admission    Star Rating Drugs:  Medication:  Last Fill: [facility] Supply Atorvastatin 20mg  07/27/22 90  [facility]   Care Gaps: Annual wellness visit in last year? Yes    Al Corpus, PharmD notified  Burt Knack, [facility] Clinical Pharmacy Assistant [phone]

## 2022-10-15 ENCOUNTER — Telehealth: Payer: Self-pay | Admitting: Pharmacist

## 2022-10-15 NOTE — Telephone Encounter (Signed)
Noted and agree. 

## 2022-10-15 NOTE — Telephone Encounter (Signed)
Spoke with neurologist Dr Margaretmary Eddy nurse at [facility], he agrees to discontinue bupropion due to risk for lower seizure threshold and lack of benefit for mood/anxiety.  Recommend short taper: 150 mg every other day for 1 week then stop  Contacted patient to discuss, he agreed to taper bupropion as above. He repeated taper plan back to me, he denied any further questions or concerns.

## 2022-10-18 DIAGNOSIS — G4733 Obstructive sleep apnea (adult) (pediatric): Secondary | ICD-10-CM | POA: Diagnosis not present

## 2022-10-23 DIAGNOSIS — H353124 Nonexudative age-related macular degeneration, left eye, advanced atrophic with subfoveal involvement: Secondary | ICD-10-CM | POA: Diagnosis not present

## 2022-12-04 ENCOUNTER — Other Ambulatory Visit: Payer: Self-pay | Admitting: Cardiovascular Disease

## 2022-12-04 DIAGNOSIS — I480 Paroxysmal atrial fibrillation: Secondary | ICD-10-CM

## 2022-12-04 NOTE — Telephone Encounter (Signed)
Refill request

## 2022-12-04 NOTE — Telephone Encounter (Addendum)
Eliquis 5mg  refill request received. Patient is 82 years old, weight-86.2kg, Crea-0.81 on 09/21/22, Diagnosis-Afib, and last seen by Dr. Mariah Milling on 09/13/21-Needs Appt. Dose is appropriate based on dosing criteria.   Pt needs an appt. Message sent to the Schedulers  Schedulers reached out to the pt and has an appointment set up for 12/10/22 with Cadence Furth. Will send in refill at this time.

## 2022-12-07 ENCOUNTER — Ambulatory Visit (INDEPENDENT_AMBULATORY_CARE_PROVIDER_SITE_OTHER)
Admission: RE | Admit: 2022-12-07 | Discharge: 2022-12-07 | Disposition: A | Discharge status: Home / Self Care | Payer: Medicare Other | Source: Ambulatory Visit | Attending: Primary Care | Admitting: Primary Care

## 2022-12-07 ENCOUNTER — Ambulatory Visit (INDEPENDENT_AMBULATORY_CARE_PROVIDER_SITE_OTHER): Payer: Medicare Other | Admitting: Primary Care

## 2022-12-07 ENCOUNTER — Encounter: Payer: Self-pay | Admitting: Primary Care

## 2022-12-07 VITALS — BP 136/72 | HR 65 | Temp 97.9°F | Ht 70.0 in | Wt 201.0 lb

## 2022-12-07 DIAGNOSIS — G8929 Other chronic pain: Secondary | ICD-10-CM

## 2022-12-07 DIAGNOSIS — I7143 Infrarenal abdominal aortic aneurysm, without rupture: Secondary | ICD-10-CM | POA: Diagnosis not present

## 2022-12-07 DIAGNOSIS — Z982 Presence of cerebrospinal fluid drainage device: Secondary | ICD-10-CM | POA: Diagnosis not present

## 2022-12-07 DIAGNOSIS — R2689 Other abnormalities of gait and mobility: Secondary | ICD-10-CM | POA: Diagnosis not present

## 2022-12-07 DIAGNOSIS — M545 Low back pain, unspecified: Secondary | ICD-10-CM

## 2022-12-07 DIAGNOSIS — M47816 Spondylosis without myelopathy or radiculopathy, lumbar region: Secondary | ICD-10-CM | POA: Diagnosis not present

## 2022-12-07 DIAGNOSIS — M549 Dorsalgia, unspecified: Secondary | ICD-10-CM | POA: Diagnosis not present

## 2022-12-07 NOTE — Assessment & Plan Note (Signed)
Secondary to sedentary lifestyle and back pain.  Continue use of cane. Referral placed for outpatient physical therapy.

## 2022-12-07 NOTE — Patient Instructions (Addendum)
You will either be contacted via phone regarding your referral to physical therapy, or you may receive a letter on your MyChart portal from our referral team with instructions for scheduling an appointment. Please let us know if you have not been contacted by anyone within two weeks.  Start walking everyday. Try to walk 5-10 minutes at a time.  Continue Tylenol as needed.  Complete xray(s) prior to leaving today. I will notify you of your results once received.  It was a pleasure to see you today!

## 2022-12-07 NOTE — Assessment & Plan Note (Addendum)
Deteriorated.  Suspect that his sedentary lifestyle is contributing.  Discussed this today.  Encouraged daily walking and stretching.  Checking plain films lumbar spine today. Reviewed MR lumbar spine from 2020.  Continue Tylenol 1000 mg 3 times daily as needed. Referral placed for physical therapy.  Offered orthopedic referral for consideration of injections, he kindly declines for now but will notify if he changes his mind. No alarm signs on exam.

## 2022-12-07 NOTE — Progress Notes (Signed)
Subjective:    Patient ID: Adam Juarez, male    DOB: 10/21/1940, 82 y.o.   MRN: [id number]  Back Pain Pertinent negatives include no dysuria, numbness or weakness.    Adam Juarez is a very pleasant 82 y.o. male with a history of chronic back pain, abdominal aortic aneurysm, chronic fatigue, elevated PSA, normal pressure hydrocephalus with shunt who presents today to discuss back pain.  His wife joins [place] today.  Chronic history of right, intermittent, lower back pain for years. Over the last few months he's noticed constant right sided lower back pain for which he describes as aching and sharp.   He's also noticed imbalance, has begun using a cane. He denies falls.   He denies radiation of pain to his lower extremity, right lower extremity numbness, urinary frequency, dysuria, hematuria, loss of bowel or bladder control.   He spends most of his day sitting in the recliner watching TV. He has begun using a cane due to imbalance and lower back pain.   He is taking Tylenol 1000 mg three times daily with temporary improvement. He completed 4 sessions of PT at [facility], didn't notice an improvement. He's never completed spinal injections.   Review of Systems  Genitourinary:  Negative for dysuria and frequency.       Denies loss of bowel or bladder control  Musculoskeletal:  Positive for back pain.  Neurological:  Negative for weakness and numbness.         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 a-fib x 1 episode patient states he "auto corrected" seen at [facility]   GERD (gastroesophageal reflux disease)    Headache    poor posture - none recently   History of kidney stones    Hydrocephalus (HCC)    Hyperlipidemia    Neuropathy    PSVT (paroxysmal supraventricular tachycardia) 2009   Controlled with "breathing process"   Renal stone  9/08, 6/15   Wears dentures    full upper    Social History   Socioeconomic History   Marital status: Married    Spouse name: Not on file   Number of children: Not on file   Years of education: Not on file   Highest education level: Not on file  Occupational History   Not on file  Tobacco Use   Smoking status: Former    Current packs/day: 0.00    Average packs/day: 0.5 packs/day for 38.0 years (19.0 ttl pk-yrs)    Types: Cigarettes    Start date: 07/14/1959    Quit date: 07/13/1997    Years since quitting: 25.4   Smokeless tobacco: Never  Vaping Use   Vaping status: Never Used  Substance and Sexual Activity   Alcohol use: No   Drug use: No   Sexual activity: Not on file  Other Topics Concern   Not on file  Social History Narrative   Not on file   Social Determinants of Health   Financial Resource Strain: Low Risk  (05/25/2022)   Overall Financial Resource Strain (CARDIA)    Difficulty of Paying Living Expenses: Not hard at all  Food Insecurity: No Food Insecurity (05/25/2022)   Hunger Vital Sign    Worried About Running Out of Food in the Last Year: Never true    Ran Out  of Food in the Last Year: Never true  Transportation Needs: No Transportation Needs (05/25/2022)   PRAPARE - Administrator, Civil Service (Medical): No    Lack of Transportation (Non-Medical): No  Physical Activity: Inactive (05/25/2022)   Exercise Vital Sign    Days of Exercise per Week: 0 days    Minutes of Exercise per Session: 0 min  Stress: No Stress Concern Present (05/25/2022)   [employer] of Occupational Health - Occupational Stress Questionnaire    Feeling of Stress : Not at all  Social Connections: Socially Integrated (05/25/2022)   Social Connection and Isolation Panel [NHANES]    Frequency of Communication with Friends and Family: Twice a week    Frequency of Social Gatherings with Friends and Family: Twice a week    Attends Religious Services: More than 4 times per year     Active Member of [employer] or Organizations: Yes    Attends Banker Meetings: More than 4 times per year    Marital Status: Married  Catering manager Violence: Not At Risk (05/25/2022)   Humiliation, Afraid, Rape, and Kick questionnaire    Fear of Current or Ex-Partner: No    Emotionally Abused: No    Physically Abused: No    Sexually Abused: No    Past Surgical History:  Procedure Laterality Date   CARDIAC CATHETERIZATION  2008   [place], [place]   CATARACT EXTRACTION W/PHACO Left 07/27/2015   Procedure: CATARACT EXTRACTION PHACO AND INTRAOCULAR LENS PLACEMENT (IOC);  Surgeon: Lockie Mola, MD;  [facility];  Service: Ophthalmology;  Laterality: Left;  TORIC   CATARACT EXTRACTION W/PHACO Right 08/24/2015   Procedure: CATARACT EXTRACTION PHACO AND INTRAOCULAR LENS PLACEMENT (IOC);  Surgeon: Lockie Mola, MD;  Location: [facility];  Service: Ophthalmology;  Laterality: Right;  TORIC   COLONOSCOPY WITH PROPOFOL N/A 08/10/2020   Procedure: COLONOSCOPY WITH PROPOFOL;  Surgeon: Toledo, Boykin Nearing, MD;  Location: [facility];  Service: Gastroenterology;  Laterality: N/A;   HEMORROIDECTOMY  2010   banding   IR ANGIO INTRA EXTRACRAN SEL COM CAROTID INNOMINATE BILAT MOD SED  10/30/2019   IR ANGIO VERTEBRAL SEL VERTEBRAL UNI R MOD SED  10/30/2019   IR RADIOLOGIST EVAL & MGMT  04/11/2020   IR US GUIDE VASC ACCESS RIGHT  10/30/2019   KIDNEY STONE SURGERY  9/08, 6/15   lithotripsy   LAPAROSCOPIC REVISION VENTRICULAR-PERITONEAL (V-P) SHUNT  01/08/2020   Procedure: LAPAROSCOPIC REVISION VENTRICULAR-PERITONEAL (V-P) SHUNT;  Surgeon: Lisbeth Renshaw, MD;  Location: [facility];  Service: Neurosurgery;;   PROSTATE BIOPSY  2007   SKIN CANCER EXCISION     TOENAIL TRIMMING  11/2017   Dr. Orland Jarred   TONSILLECTOMY     VASECTOMY     VENTRICULOPERITONEAL SHUNT Right 01/08/2020   Procedure: SHUNT INSERTION VENTRICULAR-PERITONEAL;  Surgeon: Lisbeth Renshaw,  MD;  Location: [facility];  Service: Neurosurgery;  Laterality: Right;    Family History  Problem Relation Age of Onset   AAA (abdominal aortic aneurysm) Father    Parkinson's disease Mother    Arthritis Mother     Allergies  Allergen Reactions   Bee Venom Anaphylaxis and Hives   Other Other (See Comments)    Pistachios - tickles throat  Pistachios - tickles throat   Tramadol Other (See Comments)    Current Outpatient Medications on File Prior to Visit  Medication Sig Dispense Refill   acetaminophen (TYLENOL) 500 MG tablet Take 1,000 mg by mouth every 8 (eight) hours as  needed for moderate pain.     apixaban (ELIQUIS) 5 MG TABS tablet TAKE 1 TABLET TWICE A DAY 180 tablet 1   ascorbic acid (VITAMIN C) 500 MG tablet Take 500 mg by mouth daily.     atorvastatin (LIPITOR) 20 MG tablet Take 1 tablet (20 mg total) by mouth daily. for cholesterol. 90 tablet 3   calcium carbonate (TUMS - DOSED IN MG ELEMENTAL CALCIUM) 500 MG chewable tablet Chew 1 tablet by mouth. As needed     CALCIUM-VITAMIN D PO Take 600-800 mg by mouth daily.     EPINEPHRINE 0.3 mg/0.3 mL IJ SOAJ injection INJECT 0.3 ML (0.3 MG TOTAL) INTO THE MUSCLE AS NEEDED FOR ANAPHYLAXIS 2 each 1   Magnesium Oxide 500 MG TABS Take by mouth.     Melatonin 1 MG CAPS Take 1 mg by mouth at bedtime as needed (sleep).     memantine (NAMENDA) 5 MG tablet Take 5 mg by mouth 2 (two) times daily.     metoprolol succinate (TOPROL-XL) 50 MG 24 hr tablet Take 1 tablet (50 mg total) by mouth 2 (two) times daily. 180 tablet 3   Multiple Vitamin (MULTIVITAMIN) tablet Take 1 tablet by mouth daily.     Multiple Vitamins-Minerals (PRESERVISION AREDS 2 PO) Take 1 capsule by mouth 2 (two) times daily.      omeprazole (PRILOSEC) 20 MG capsule Take 1 capsule (20 mg total) by mouth 2 (two) times daily before a meal. For heartburn. 180 capsule 1   Polyethyl Glycol-Propyl Glycol (SYSTANE OP) Apply 1 drop to eye 3 (three) times daily as needed (dry eyes).      Potassium Gluconate 550 MG TABS Take 550 mg by mouth daily.     Probiotic Product (PROBIOTIC DAILY PO) Take 1 capsule by mouth daily.      vitamin B-12 (CYANOCOBALAMIN) 1000 MCG tablet Take 1,000 mcg by mouth daily.     No current facility-administered medications on file prior to visit.    BP 136/72   Pulse 65   Temp 97.9 F (36.6 C) (Temporal)   Ht 5\' 10"  (1.778 m)   Wt 201 lb (91.2 kg)   SpO2 96%   BMI 28.84 kg/m  Objective:   Physical Exam Cardiovascular:     Rate and Rhythm: Normal rate and regular rhythm.  Pulmonary:     Effort: Pulmonary effort is normal.  Musculoskeletal:     Cervical back: Neck supple.     Lumbar back: Tenderness present. Negative right straight leg raise test and negative left straight leg raise test.       Back:  Skin:    General: Skin is warm and dry.  Neurological:     Mental Status: He is alert and oriented to person, place, and time.           Assessment & Plan:  Chronic right-sided low back pain without sciatica Assessment & Plan: Deteriorated.  Suspect that his sedentary lifestyle is contributing.  Discussed this today.  Encouraged daily walking and stretching.  Checking plain films lumbar spine today. Reviewed MR lumbar spine from 2020.  Continue Tylenol 1000 mg 3 times daily as needed. Referral placed for physical therapy.  Offered orthopedic referral for consideration of injections, he kindly declines for now but will notify if he changes his mind. No alarm signs on exam.  Orders: -     Ambulatory referral to Physical Therapy -     DG Lumbar Spine Complete  Imbalance Assessment & Plan: Secondary to  sedentary lifestyle and back pain.  Continue use of cane. Referral placed for outpatient physical therapy.  Orders: -     Ambulatory referral to Physical Therapy        [name], NP

## 2022-12-10 ENCOUNTER — Ambulatory Visit: Payer: Medicare Other | Attending: Medical | Admitting: Medical

## 2022-12-10 ENCOUNTER — Encounter: Payer: Self-pay | Admitting: Medical

## 2022-12-10 VITALS — BP 130/70 | HR 75 | Ht 70.0 in | Wt 199.2 lb

## 2022-12-10 DIAGNOSIS — I739 Peripheral vascular disease, unspecified: Secondary | ICD-10-CM | POA: Diagnosis not present

## 2022-12-10 DIAGNOSIS — I471 Supraventricular tachycardia, unspecified: Secondary | ICD-10-CM | POA: Insufficient documentation

## 2022-12-10 DIAGNOSIS — R011 Cardiac murmur, unspecified: Secondary | ICD-10-CM | POA: Diagnosis not present

## 2022-12-10 DIAGNOSIS — I700 Atherosclerosis of aorta: Secondary | ICD-10-CM | POA: Insufficient documentation

## 2022-12-10 DIAGNOSIS — I480 Paroxysmal atrial fibrillation: Secondary | ICD-10-CM | POA: Diagnosis not present

## 2022-12-10 NOTE — Progress Notes (Signed)
Cardiology Office Note:    Date:  12/10/2022   ID:  Adam Juarez, DOB 06-05-1940, MRN [id number]  PCP:  [name], NP  [facility] Cardiologist:  Julien Nordmann, MD  [facility] Electrophysiologist:  None   Referring MD: [name], NP   Chief Complaint: 1 year follow-up  History of Present Illness:    Adam Juarez is a 82 y.o. male with a hx of PAD, h/o smoking, moderate aortic atherosclerosis, paroxysmal Afib, PVCs, pSVT, infrarenal abdominal aortic aneurysm who presents for 1 year follow-up.   The patient has a h/o Afib on Eliquis. Heart monitor in 2023 showed NSR with afib 4% burden, PVCs (8% burden). BB was increased.   Stress test in 2020 showed no ischemia, LVEF 55 to 65%, low risk study.  Patient has a history of infrarenal abdominal aortic aneurysm. Most recent CT scan 09/2022 showed 4.1 cm.  Recommended repeat imaging in 12 months.  Today, the patient reports he is overall doing well. He denies chest pain or SOB. Watch may say heart rate is a little abnormal, but he denies palpitations or heart fluttering. No lower leg edema, orthopnea, pnd. EKG today shows PVCs. He denies bleeding issues on Eliquis.    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 a-fib x 1 episode patient states he "auto corrected" seen at [facility]   GERD (gastroesophageal reflux disease)    Headache    poor posture - none recently   History of kidney stones    Hydrocephalus (HCC)    Hyperlipidemia    Neuropathy    PSVT (paroxysmal supraventricular tachycardia) 2009   Controlled with "breathing process"   Renal stone 9/08, 6/15   Wears dentures    full upper    Past Surgical History:  Procedure Laterality Date   CARDIAC CATHETERIZATION  2008   [place], [place]   CATARACT EXTRACTION [facility] Left 07/27/2015   Procedure: CATARACT EXTRACTION  PHACO AND INTRAOCULAR LENS PLACEMENT (IOC);  Surgeon: Lockie Mola, MD;  Location: [facility];  Service: Ophthalmology;  Laterality: Left;  TORIC   CATARACT EXTRACTION W/PHACO Right 08/24/2015   Procedure: CATARACT EXTRACTION PHACO AND INTRAOCULAR LENS PLACEMENT (IOC);  Surgeon: Lockie Mola, MD;  Location: [facility];  Service: Ophthalmology;  Laterality: Right;  TORIC   COLONOSCOPY WITH PROPOFOL N/A 08/10/2020   Procedure: COLONOSCOPY WITH PROPOFOL;  Surgeon: Toledo, Boykin Nearing, MD;  Location: [facility];  Service: Gastroenterology;  Laterality: N/A;   HEMORROIDECTOMY  2010   banding   IR ANGIO INTRA EXTRACRAN SEL COM CAROTID INNOMINATE BILAT MOD SED  10/30/2019   IR ANGIO VERTEBRAL SEL VERTEBRAL UNI R MOD SED  10/30/2019   IR RADIOLOGIST EVAL & MGMT  04/11/2020   IR US GUIDE VASC ACCESS RIGHT  10/30/2019   KIDNEY STONE SURGERY  9/08, 6/15   lithotripsy   LAPAROSCOPIC REVISION VENTRICULAR-PERITONEAL (V-P) SHUNT  01/08/2020   Procedure: LAPAROSCOPIC REVISION VENTRICULAR-PERITONEAL (V-P) SHUNT;  Surgeon: Lisbeth Renshaw, MD;  [facility]: [facility];  Service: Neurosurgery;;   PROSTATE BIOPSY  2007   SKIN CANCER EXCISION     TOENAIL TRIMMING  11/2017   Dr. Orland Jarred   TONSILLECTOMY     VASECTOMY     VENTRICULOPERITONEAL SHUNT Right 01/08/2020   Procedure: SHUNT INSERTION VENTRICULAR-PERITONEAL;  Surgeon: Lisbeth Renshaw, MD;  Location: [facility];  Service: Neurosurgery;  Laterality: Right;    Current Medications: Current Meds  Medication Sig   acetaminophen (TYLENOL) 500 MG tablet Take 1,000 mg by mouth every 8 (eight) hours as needed for moderate pain.   apixaban (ELIQUIS) 5 MG TABS tablet TAKE 1 TABLET TWICE A DAY   ascorbic acid (VITAMIN C) 500 MG tablet Take 500 mg by mouth daily.   atorvastatin (LIPITOR) 20 MG tablet Take 1 tablet (20 mg total) by mouth daily. for cholesterol.   calcium carbonate (TUMS - DOSED IN MG ELEMENTAL CALCIUM) 500 MG chewable tablet  Chew 1 tablet by mouth. As needed   CALCIUM-VITAMIN D PO Take 600-800 mg by mouth daily.   EPINEPHRINE 0.3 mg/0.3 mL IJ SOAJ injection INJECT 0.3 ML (0.3 MG TOTAL) INTO THE MUSCLE AS NEEDED FOR ANAPHYLAXIS   Magnesium Oxide 500 MG TABS Take by mouth.   Melatonin 1 MG CAPS Take 1 mg by mouth at bedtime as needed (sleep).   memantine (NAMENDA) 5 MG tablet Take 5 mg by mouth 2 (two) times daily.   metoprolol succinate (TOPROL-XL) 50 MG 24 hr tablet Take 1 tablet (50 mg total) by mouth 2 (two) times daily.   Multiple Vitamin (MULTIVITAMIN) tablet Take 1 tablet by mouth daily.   Multiple Vitamins-Minerals (PRESERVISION AREDS 2 PO) Take 1 capsule by mouth 2 (two) times daily.    omeprazole (PRILOSEC) 20 MG capsule Take 1 capsule (20 mg total) by mouth 2 (two) times daily before a meal. For heartburn.   Polyethyl Glycol-Propyl Glycol (SYSTANE OP) Apply 1 drop to eye 3 (three) times daily as needed (dry eyes).   Potassium Gluconate 550 MG TABS Take 550 mg by mouth daily.   Probiotic Product (PROBIOTIC DAILY PO) Take 1 capsule by mouth daily.    vitamin B-12 (CYANOCOBALAMIN) 1000 MCG tablet Take 1,000 mcg by mouth daily.     Allergies:   Bee venom, Other, and Tramadol   Social History   Socioeconomic History   Marital status: Married    Spouse name: Not on file   Number of children: Not on file   Years of education: Not on file   Highest education level: Not on file  Occupational History   Not on file  Tobacco Use   Smoking status: Former    Current packs/day: 0.00    Average packs/day: 0.5 packs/day for 38.0 years (19.0 ttl pk-yrs)    Types: Cigarettes    Start date: 07/14/1959    Quit date: 07/13/1997    Years since quitting: 25.4   Smokeless tobacco: Never  Vaping Use   Vaping status: Never Used  Substance and Sexual Activity   Alcohol use: No   Drug use: No   Sexual activity: Not on file  Other Topics Concern   Not on file  Social History Narrative   Not on file   Social  Determinants of Health   Financial Resource Strain: Low Risk  (05/25/2022)   Overall Financial Resource Strain (CARDIA)    Difficulty of Paying Living Expenses: Not hard at all  Food Insecurity: No Food Insecurity (05/25/2022)   Hunger Vital Sign    Worried About Running Out of Food in the Last Year: Never true    Ran Out of Food in the Last Year: Never true  Transportation Needs: No Transportation Needs (05/25/2022)   PRAPARE - Administrator, Civil Service (Medical): No    Lack of Transportation (Non-Medical): No  Physical Activity: Inactive (05/25/2022)  Exercise Vital Sign    Days of Exercise per Week: 0 days    Minutes of Exercise per Session: 0 min  Stress: No Stress Concern Present (05/25/2022)   [employer] of Occupational Health - Occupational Stress Questionnaire    Feeling of Stress : Not at all  Social Connections: Socially Integrated (05/25/2022)   Social Connection and Isolation Panel [NHANES]    Frequency of Communication with Friends and Family: Twice a week    Frequency of Social Gatherings with Friends and Family: Twice a week    Attends Religious Services: More than 4 times per year    Active Member of [employer] or Organizations: Yes    Attends Engineer, structural: More than 4 times per year    Marital Status: Married     Family History: The patient's family history includes AAA (abdominal aortic aneurysm) in his father; Arthritis in his mother; Parkinson's disease in his mother.  ROS:   Please see the history of present illness.     All other systems reviewed and are negative.  EKGs/Labs/Other Studies Reviewed:    The following studies were reviewed today:  Heart monitor 09/2021 Event monitor Patch Wear Time:  8 days and 2 hours (2023-05-05T16:40:56-0400 to 2023-05-13T19:06:03-399)   Normal sinus rhythm Atrial Fibrillation occurred (4% burden), ranging from 53-130 bpm (avg of 78 bpm), the longest lasting 7 hours 58 mins with an avg  rate of 78 bpm. Patient had a min HR of 53 bpm, max HR of 174 bpm, and avg HR of 67 bpm.     2 Ventricular Tachycardia runs occurred, the run with the fastest interval lasting 7 beats with a max rate of 174 bpm (avg 127 bpm); the run with the fastest interval was also the longest.    8 Supraventricular Tachycardia runs occurred, the run with the fastest interval lasting 12 beats with a max rate of 144 bpm, the longest lasting 10 beats with an avg rate of 103 bpm.  Isolated SVEs were rare (<1.0%), SVE Couplets were rare (<1.0%), and SVE Triplets were rare (<1.0%).   Isolated VEs were frequent (8.2%, 63995), VE Couplets were rare (<1.0%, 313), and VE Triplets were rare (<1.0%, 9). Ventricular Bigeminy and Trigeminy were present.    Patient triggered events (4) associated with PVCs Atrial fibrillation was not triggered   Signed, Dossie Arbour, MD, Ph.D [facility]    EKG:  EKG is ordered today.  The ekg ordered today demonstrates NSR 79bpm, PVCs, no significant ST/T wave changes  Recent Labs: 09/21/2022: ALT 16; BUN 20; Creatinine, Ser 0.81; Hemoglobin 14.6; Platelets 119; Potassium 4.0; Sodium 139  Recent Lipid Panel    Component Value Date/Time   CHOL 124 09/13/2021 0859   TRIG 262 (H) 09/13/2021 0859   HDL 32 (L) 09/13/2021 0859   CHOLHDL 3.9 09/13/2021 0859   VLDL 52 (H) 09/13/2021 0859   LDLCALC 40 09/13/2021 0859   LDLDIRECT 41.0 07/22/2020 0834     Physical Exam:    VS:  BP 130/70   Pulse 75   Ht 5\' 10"  (1.778 m)   Wt 199 lb 3.2 oz (90.4 kg)   SpO2 95%   BMI 28.58 kg/m     Wt Readings from Last 3 Encounters:  12/10/22 199 lb 3.2 oz (90.4 kg)  12/07/22 201 lb (91.2 kg)  09/21/22 190 lb (86.2 kg)     GEN:  Well nourished, well developed in no acute distress HEENT: Normal NECK: No JVD; No carotid bruits LYMPHATICS:  No lymphadenopathy CARDIAC: RRR, no murmurs, rubs, gallops RESPIRATORY:  Clear to auscultation without rales, wheezing or rhonchi  ABDOMEN: Soft,  non-tender, non-distended MUSCULOSKELETAL:  No edema; No deformity  SKIN: Warm and dry NEUROLOGIC:  Alert and oriented x 3 PSYCHIATRIC:  Normal affect   ASSESSMENT:    1. Paroxysmal A-fib (HCC)   2. SVT (supraventricular tachycardia)   3. Aortic atherosclerosis (HCC)   4. PAD (peripheral artery disease) (HCC)   5. Murmur    PLAN:    In order of problems listed above:  Paroxysmal Afib Patient is in normal sinus rhythm on EKG today.  Continue Eliquis 5 mg twice a day for stroke prophylaxis.  Continue Toprol 50 mg twice daily for rate control.  SVT Patient denies any palpitations or heart racing.  EKG shows PVCs.  Continue beta-blocker therapy.  Aortic atherosclerosis LDL 48, TG 262, HDL 32, total chol 124.  Continue Lipitor 20 mg daily.  AAA CT abdomen pelvis in May 2024 showed 4.1 cm infrarenal abdominal aortic aneurysm.  Recommended repeat imaging in 36-months.  Murmur Update echo as above.  Disposition: Follow up in 1 year(s) with MD/APP    Signed, Vlasta Baskin David Stall, PA-C  12/10/2022 4:38 PM    [facility]

## 2022-12-10 NOTE — Patient Instructions (Signed)
Medication Instructions:  Your Physician recommend you continue on your current medication as directed.     *If you need a refill on your cardiac medications before your next appointment, please call your pharmacy*   Lab Work: None ordered today   Testing/Procedures: Your physician has requested that you have an echocardiogram. Echocardiography is a painless test that uses sound waves to create images of your heart. It provides your doctor with information about the size and shape of your heart and how well your heart's chambers and valves are working.   You may receive an ultrasound enhancing agent through an IV if needed to better visualize your heart during the echo. This procedure takes approximately one hour.  There are no restrictions for this procedure.  This will take place at [address], [place] [address]    Follow-Up: At [facility], you and your health needs are our priority.  As part of our continuing mission to provide you with exceptional heart care, we have created designated Provider Care Teams.  These Care Teams include your primary Cardiologist (physician) and Advanced Practice Providers (APPs -  Physician Assistants and Nurse Practitioners) who all work together to provide you with the care you need, when you need it.  We recommend signing up for the patient portal called "MyChart".  Sign up information is provided on this After Visit Summary.  MyChart is used to connect with patients for Virtual Visits (Telemedicine).  Patients are able to view lab/test results, encounter notes, upcoming appointments, etc.  Non-urgent messages can be sent to your provider as well.   To learn more about what you can do with MyChart, go to [email or web address].    Your next appointment:   1 year(s)  Provider:   You may see Julien Nordmann, MD or one of the following Advanced Practice Providers on your designated Care Team:    Nicolasa Ducking, NP Eula Listen, PA-C Cadence Fransico Michael, PA-C Charlsie Quest, NP

## 2022-12-17 ENCOUNTER — Other Ambulatory Visit: Payer: Self-pay

## 2022-12-17 ENCOUNTER — Ambulatory Visit: Payer: Self-pay

## 2022-12-17 ENCOUNTER — Telehealth: Payer: Self-pay | Admitting: Medical

## 2022-12-17 DIAGNOSIS — I714 Abdominal aortic aneurysm, without rupture, unspecified: Secondary | ICD-10-CM

## 2022-12-17 NOTE — Patient Outreach (Addendum)
  Care Coordination   Follow Up Visit Note   12/17/2022 Name: Adam Juarez MRN: [id number] DOB: [birth year]  Adam Juarez is a 82 y.o. year old male who sees Adam Nest, NP for primary care. I spoke with  Adam Juarez  and spouse Adam Juarez by phone today.  What matters to the patients health and wellness today?  Wife states patient' has been followed for years regarding his Aortic Aneurysm. She states recently patient was informed by primary care provider that Aortic Aneurysm has increase in size.  Patient states he has not heard back regarding discussed referral to specialist for AA follow up.  Patient states he is concerned of the sudden size increase because his father passed away due to an AA.  Patient reports having chronic back pain. He states his balance has been a little off as well. Denies any falls. Patient states his primary provider is going to refer him for PT. He states he has not heard from PT regarding an appointment.  Patient states he is scheduled for a hearing aid appointment at the [place] this month.     Goals Addressed             This Visit's Progress    Patient stated:  Management of Afib and chronic health conditions.       Interventions Today    Flowsheet Row Most Recent Value  Chronic Disease   Chronic disease during today's visit Other, Atrial Fibrillation (AFib)  [aneurysm, back pain]  General Interventions   General Interventions Discussed/Reviewed General Interventions Reviewed, Doctor Visits, Communication with  [evaluation of current treatment plan related to aneurysm/ Atrial fibrillation/ back pain and patients adherence to plan as established by provider. Discussed echocardiogram referral.]  Doctor Visits Discussed/Reviewed Doctor Visits Reviewed  Adam Juarez upcoming provider visits]  Communication with --  [name] and [name] contacted regarding patient PT referral. Message sent to patients primary provider  regarding AA referral request/ concerns.]  Education Interventions   Education Provided Provided Education  [Patient advised if he does not hear regarding appointment for echocardiogram or PT to notify RN case manager.]  Pharmacy Interventions   Pharmacy Dicussed/Reviewed Pharmacy Topics Reviewed  [medications reviewed. Discussed importance of compliance.]  Safety Interventions   Safety Discussed/Reviewed Fall Risk  [Assessed for falls. Fall prevention education article sent to patient through MyChart.]                 SDOH assessments and interventions completed:  No     Care Coordination Interventions:  Yes, provided   Follow up plan: Follow up call scheduled for 01/23/23    Encounter Outcome:  Pt. Visit Completed   Adam Ina RN,BSN,CCM [facility] Care Coordination [phone] direct line

## 2022-12-17 NOTE — Telephone Encounter (Signed)
New Message:     Patient is concerned about seeing how his Aortic Aneurysm is. He would like for [facility] to please order the test to check it please.

## 2022-12-17 NOTE — Patient Instructions (Addendum)
Visit Information  Thank you for taking time to visit with me today. Please don't hesitate to contact me if I can be of assistance to you.   Following are the goals we discussed today:   Goals Addressed             This Visit's Progress    Patient stated:  Management of Afib and chronic health conditions.       Interventions Today    Flowsheet Row Most Recent Value  Chronic Disease   Chronic disease during today's visit Other, Atrial Fibrillation (AFib)  [aneurysm, back pain]  General Interventions   General Interventions Discussed/Reviewed General Interventions Reviewed, Doctor Visits, Communication with  [evaluation of current treatment plan related to aneurysm/ Atrial fibrillation/ back pain and patients adherence to plan as established by provider. Discussed echocardiogram referral.]  Doctor Visits Discussed/Reviewed Doctor Visits Reviewed  [name] upcoming provider visits]  Communication with --  [facility] and [place] [facility] contacted regarding patient PT referral. Message sent to patients primary provider regarding AA referral request/ concerns.]  Education Interventions   Education Provided Provided Education  [Patient advised if he does not hear regarding appointment for echocardiogram or PT to notify RN case manager.]  Pharmacy Interventions   Pharmacy Dicussed/Reviewed Pharmacy Topics Reviewed  [medications reviewed. Discussed importance of compliance.]  Safety Interventions   Safety Discussed/Reviewed Fall Risk  [Assessed for falls. Fall prevention education article sent to patient through MyChart.]                 Our next appointment is by telephone on 01/23/23 at 2 pm  Please call the care guide team at [phone] if you need to cancel or reschedule your appointment.   If you are experiencing a Mental Health or Behavioral Health Crisis or need someone to talk to, please call the Suicide and Crisis Lifeline: 988 call  1-800-273-TALK (toll free, 24 hour hotline)  Patient verbalizes understanding of instructions and care plan provided today and agrees to view in MyChart. Active MyChart status and patient understanding of how to access instructions and care plan via MyChart confirmed with patient.     Adam Ina RN,BSN,CCM [facility] Care Coordination [phone] direct line  Fall Prevention in the Home, Adult Falls can cause injuries and can happen to people of all ages. There are many things you can do to make your home safer and to help prevent falls. What actions can I take to prevent falls? General information Use good lighting in all rooms. Make sure to: Replace any light bulbs that burn out. Turn on the lights in dark areas and use night-lights. Keep items that you use often in easy-to-reach places. Lower the shelves around your home if needed. Move furniture so that there are clear paths around it. Do not use throw rugs or other things on the floor that can make you trip. If any of your floors are uneven, fix them. Add color or contrast paint or tape to clearly mark and help you see: Grab bars or handrails. First and last steps of staircases. Where the edge of each step is. If you use a ladder or stepladder: Make sure that it is fully opened. Do not climb a closed ladder. Make sure the sides of the ladder are locked in place. Have someone hold the ladder while you use it. Know where your pets are as you move through your home. What can I do in the bathroom?     Keep the floor  dry. Clean up any water on the floor right away. Remove soap buildup in the bathtub or shower. Buildup makes bathtubs and showers slippery. Use non-skid mats or decals on the floor of the bathtub or shower. Attach bath mats securely with double-sided, non-slip rug tape. If you need to sit down in the shower, use a non-slip stool. Install grab bars by the toilet and in the bathtub and shower. Do not use towel bars as  grab bars. What can I do in the bedroom? Make sure that you have a light by your bed that is easy to reach. Do not use any sheets or blankets on your bed that hang to the floor. Have a firm chair or bench with side arms that you can use for support when you get dressed. What can I do in the kitchen? Clean up any spills right away. If you need to reach something above you, use a step stool with a grab bar. Keep electrical cords out of the way. Do not use floor polish or wax that makes floors slippery. What can I do with my stairs? Do not leave anything on the stairs. Make sure that you have a light switch at the top and the bottom of the stairs. Make sure that there are handrails on both sides of the stairs. Fix handrails that are broken or loose. Install non-slip stair treads on all your stairs if they do not have carpet. Avoid having throw rugs at the top or bottom of the stairs. Choose a carpet that does not hide the edge of the steps on the stairs. Make sure that the carpet is firmly attached to the stairs. Fix carpet that is loose or worn. What can I do on the outside of my home? Use bright outdoor lighting. Fix the edges of walkways and driveways and fix any cracks. Clear paths of anything that can make you trip, such as tools or rocks. Add color or contrast paint or tape to clearly mark and help you see anything that might make you trip as you walk through a door, such as a raised step or threshold. Trim any bushes or trees on paths to your home. Check to see if handrails are loose or broken and that both sides of all steps have handrails. Install guardrails along the edges of any raised decks and porches. Have leaves, snow, or ice cleared regularly. Use sand, salt, or ice melter on paths if you live where there is ice and snow during the winter. Clean up any spills in your garage right away. This includes grease or oil spills. What other actions can I take? Review your medicines with  your doctor. Some medicines can cause dizziness or changes in blood pressure, which increase your risk of falling. Wear shoes that: Have a low heel. Do not wear high heels. Have rubber bottoms and are closed at the toe. Feel good on your feet and fit well. Use tools that help you move around if needed. These include: Canes. Walkers. Scooters. Crutches. Ask your doctor what else you can do to help prevent falls. This may include seeing a physical therapist to learn to do exercises to move better and get stronger. Where to find more information Centers for Disease Control and Prevention, [employer]: TonerPromos.no [employer] on Aging: BaseRingTones.pl National Institute on Aging: BaseRingTones.pl Contact a doctor if: You are afraid of falling at home. You feel weak, drowsy, or dizzy at home. You fall at home. Get help right away if you:  Lose consciousness or have trouble moving after a fall. Have a fall that causes a head injury. These symptoms may be an emergency. Get help right away. Call 911. Do not wait to see if the symptoms will go away. Do not drive yourself to the hospital. This information is not intended to replace advice given to you by your health care provider. Make sure you discuss any questions you have with your health care provider. Document Revised: 01/01/2022 Document Reviewed: 01/01/2022 Elsevier Patient Education  2024 [employer].

## 2022-12-18 ENCOUNTER — Telehealth: Payer: Self-pay

## 2022-12-18 ENCOUNTER — Other Ambulatory Visit: Payer: Self-pay | Admitting: Primary Care

## 2022-12-18 DIAGNOSIS — Z961 Presence of intraocular lens: Secondary | ICD-10-CM | POA: Diagnosis not present

## 2022-12-18 DIAGNOSIS — H353124 Nonexudative age-related macular degeneration, left eye, advanced atrophic with subfoveal involvement: Secondary | ICD-10-CM | POA: Diagnosis not present

## 2022-12-18 DIAGNOSIS — I714 Abdominal aortic aneurysm, without rupture, unspecified: Secondary | ICD-10-CM

## 2022-12-18 DIAGNOSIS — H353112 Nonexudative age-related macular degeneration, right eye, intermediate dry stage: Secondary | ICD-10-CM | POA: Diagnosis not present

## 2022-12-18 NOTE — Telephone Encounter (Signed)
This encounter was created in error - please disregard.

## 2022-12-18 NOTE — Patient Outreach (Signed)
  Care Coordination   Follow Up Visit Note   12/18/2022 Name: Adam Juarez MRN: [id number] DOB: 1941/03/23  Adam Juarez is a 82 y.o. year old male who sees Adam Nest, NP for primary care. I spoke with  Adam Juarez by phone today.    Goals Addressed             This Visit's Progress    Patient stated:  Management of Afib and chronic health conditions.       Interventions Today    Flowsheet Row Most Recent Value  Chronic Disease   Chronic disease during today's visit Other  [Aneurysm]  General Interventions   General Interventions Discussed/Reviewed General Interventions Reviewed  [Patient notified primary care provider will send referral to vascular surgeon]                 SDOH assessments and interventions completed:  No     Care Coordination Interventions:  Yes, provided   Follow up plan:  as previously scheduled    Encounter Outcome:  Pt. Visit Completed   Adam Ina RN,BSN,CCM [facility] Care Coordination [phone] direct line

## 2022-12-26 ENCOUNTER — Telehealth: Payer: Self-pay

## 2022-12-26 NOTE — Patient Outreach (Signed)
  Care Coordination   12/26/2022 Name: Adam Juarez MRN: [id number] DOB: May 06, 1941   Care Coordination Outreach Attempts:  An unsuccessful telephone outreach was attempted today to offer the patient information about available care coordination services.  Attempted return call to patient and wife.   Unable to reach. HIPAA compliant message left.   Follow Up Plan:  Additional outreach attempts will be made to offer the patient care coordination information and services.   Encounter Outcome:  No Answer   Care Coordination Interventions:  No, not indicated   [name] [facility] Care Coordination [phone] direct line

## 2022-12-27 ENCOUNTER — Ambulatory Visit: Payer: Medicare Other | Attending: Primary Care

## 2022-12-27 DIAGNOSIS — G8929 Other chronic pain: Secondary | ICD-10-CM | POA: Diagnosis not present

## 2022-12-27 DIAGNOSIS — M5441 Lumbago with sciatica, right side: Secondary | ICD-10-CM | POA: Insufficient documentation

## 2022-12-27 DIAGNOSIS — M545 Low back pain, unspecified: Secondary | ICD-10-CM | POA: Diagnosis not present

## 2022-12-27 DIAGNOSIS — R4189 Other symptoms and signs involving cognitive functions and awareness: Secondary | ICD-10-CM | POA: Diagnosis not present

## 2022-12-27 DIAGNOSIS — M5459 Other low back pain: Secondary | ICD-10-CM | POA: Diagnosis not present

## 2022-12-27 DIAGNOSIS — R2689 Other abnormalities of gait and mobility: Secondary | ICD-10-CM | POA: Diagnosis not present

## 2022-12-27 NOTE — Therapy (Signed)
OUTPATIENT PHYSICAL THERAPY NEURO EVALUATION   Patient Name: Adam Juarez MRN: [id number] DOB:1941-01-14, 82 y.o., male Today's Date: 12/27/2022   PCP: [name], NP  REFERRING PROVIDER: [name], NP  END OF SESSION:  PT End of Session - 12/27/22 1530     Visit Number 1    Number of Visits 24    Date for PT Re-Evaluation 03/21/23    PT Start Time 1525    PT Stop Time 1600    PT Time Calculation (min) 35 min    Equipment Utilized During Treatment Gait belt    Activity Tolerance Patient tolerated treatment well    Behavior During Therapy [facility] for tasks assessed/performed             Past Medical History:  Diagnosis Date   Arthritis    fingers   Cancer (HCC) 1991, 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 a-fib x 1 episode patient states he "auto corrected" seen at [facility]   GERD (gastroesophageal reflux disease)    Headache    poor posture - none recently   History of kidney stones    Hydrocephalus (HCC)    Hyperlipidemia    Neuropathy    PSVT (paroxysmal supraventricular tachycardia) 2009   Controlled with "breathing process"   Renal stone 9/08, 6/15   Wears dentures    full upper   Past Surgical History:  Procedure Laterality Date   CARDIAC CATHETERIZATION  2008   [place], [place]   CATARACT EXTRACTION [facility] Left 07/27/2015   Procedure: CATARACT EXTRACTION PHACO AND INTRAOCULAR LENS PLACEMENT (IOC);  Surgeon: Lockie Mola, MD;  Location: [facility];  Service: Ophthalmology;  Laterality: Left;  TORIC   CATARACT EXTRACTION W/PHACO Right 08/24/2015   Procedure: CATARACT EXTRACTION PHACO AND INTRAOCULAR LENS PLACEMENT (IOC);  Surgeon: Lockie Mola, MD;  Location: [facility];  Service: Ophthalmology;  Laterality: Right;  TORIC   COLONOSCOPY WITH PROPOFOL N/A 08/10/2020   Procedure: COLONOSCOPY WITH PROPOFOL;  Surgeon: Toledo, Boykin Nearing, MD;  Location: [facility]  ENDOSCOPY;  Service: Gastroenterology;  Laterality: N/A;   HEMORROIDECTOMY  2010   banding   IR ANGIO INTRA EXTRACRAN SEL COM CAROTID INNOMINATE BILAT MOD SED  10/30/2019   IR ANGIO VERTEBRAL SEL VERTEBRAL UNI R MOD SED  10/30/2019   IR RADIOLOGIST EVAL & MGMT  04/11/2020   IR US GUIDE VASC ACCESS RIGHT  10/30/2019   KIDNEY STONE SURGERY  9/08, 6/15   lithotripsy   LAPAROSCOPIC REVISION VENTRICULAR-PERITONEAL (V-P) SHUNT  01/08/2020   Procedure: LAPAROSCOPIC REVISION VENTRICULAR-PERITONEAL (V-P) SHUNT;  Surgeon: Lisbeth Renshaw, MD;  Location: [facility] OR;  Service: Neurosurgery;;   PROSTATE BIOPSY  2007   SKIN CANCER EXCISION     TOENAIL TRIMMING  11/2017   Dr. Orland Jarred   TONSILLECTOMY     VASECTOMY     VENTRICULOPERITONEAL SHUNT Right 01/08/2020   Procedure: SHUNT INSERTION VENTRICULAR-PERITONEAL;  Surgeon: Lisbeth Renshaw, MD;  Location: [facility] OR;  Service: Neurosurgery;  Laterality: Right;   Patient Active Problem List   Diagnosis Date Noted   Imbalance 12/07/2022   Paronychia of right index finger 07/16/2022   Foul smelling urine 06/22/2022   Right lower quadrant abdominal pain 06/22/2022   Sensorineural hearing loss, bilateral 05/25/2022   Gastroesophageal reflux disease 05/25/2022   Other specified counseling 05/25/2022   (Idiopathic) normal pressure hydrocephalus (HCC) 05/25/2022   Paroxysmal tachycardia (HCC) 05/25/2022   Elevated  bilirubin 08/04/2021   History of macular degeneration 06/22/2021   Dizziness 12/01/2020   Chronic abdominal pain 11/10/2020   Proctalgia 10/27/2020   Hemorrhoids with complication 10/27/2020   Hemorrhoids 07/21/2020   Colon cancer screening 07/14/2020   Chronic idiopathic constipation 04/21/2020   Prolapsed internal hemorrhoids, grade 3 04/21/2020   VP (ventriculoperitoneal) shunt status 04/21/2020   Bilateral lower abdominal pain 04/21/2020   Abdominal pain, generalized 02/09/2020   Normal pressure hydrocephalus (HCC) 01/08/2020   Excessive  daytime sleepiness 10/19/2019   Hip pain 07/15/2019   Medicare annual wellness visit, subsequent 02/06/2019   Memory changes 11/21/2018   Aortic atherosclerosis (HCC) 03/27/2018   PAD (peripheral artery disease) (HCC) 03/27/2018   Smoking history 03/27/2018   Chronic left shoulder pain 03/13/2018   History of systemic reaction to bee sting 01/28/2018   Prediabetes 01/28/2018   History of elevated PSA 01/28/2018   AAA (abdominal aortic aneurysm) (HCC) 05/22/2017   Osteoarthritis 07/13/2016   Chronic foot pain 07/13/2016   Encounter for abdominal aortic aneurysm (AAA) screening 02/17/2016   Mixed hyperlipidemia 11/27/2015   Chronic back pain 11/27/2015   Chronic fatigue 11/27/2015   SVT (supraventricular tachycardia) 09/16/2015   Paroxysmal atrial fibrillation (HCC) 09/16/2015   Frequent PVCs 09/16/2015   Esophageal reflux 09/16/2015   Preventative health care 09/16/2015   Encounter for general adult medical examination without abnormal findings 09/16/2015   Hypertriglyceridemia 04/21/2015    ONSET DATE: Balance: 3-4 months; Back pain: years  REFERRING DIAG:  M54.50,G89.29 (ICD-10-CM) - Chronic right-sided low back pain without sciatica R26.89 (ICD-10-CM) - Imbalance    THERAPY DIAG:  Imbalance  Rationale for Evaluation and Treatment: Rehabilitation  SUBJECTIVE:                                                                                                                                                                                             SUBJECTIVE STATEMENT:  Pt notes that he has been recently having some increased difficulty with balance over the past 3-4 months.  Pt has been utilizing a cane for the past 2-3 weeks.  Pt notes that he was not using the cane for balance, but due to the pain he was experiencing in the back that shoots into the LE.  Pt states he has progressed to having to use the cane to catch himself when the pain hits.  Pt has neuropathy in his  feet, has been taking medication for it and he believe it is starting to help.   Pt accompanied by: self  PERTINENT HISTORY: see subjective  PAIN:  Are you having pain? Yes: NPRS scale: 1.38/10 Pain location:  low back Pain description: dull, radiating to the L LE Aggravating factors: Getting up from seated position Relieving factors: Standing/walking Best: 0/10 Worst: 9/10, but does not last long  PRECAUTIONS: Other: shunt in the cranial region, for pressure relief  RED FLAGS: Bowel or bladder incontinence: Yes: pt reports due to old age. and Abdominal aneurysm: Yes: first discovered when he retired from the [employer] BEARING RESTRICTIONS: No  FALLS: Has patient fallen in last 6 months? Yes. Number of falls 1  LIVING ENVIRONMENT: Lives with: lives with their spouse Lives in: House/apartment Stairs: No Has following equipment at home: Single point cane, shower chair, and Grab bars  PLOF: Independent  PATIENT GOALS:  Improve the back pain so it does not immobilize him.     OBJECTIVE:   DIAGNOSTIC FINDINGS:   EXAM: LUMBAR SPINE - COMPLETE 4+ VIEW  IMPRESSION: 1. Multilevel degenerative changes of the lumbar spine, most pronounced at L3-L4. 2. Fusiform infrarenal abdominal aortic aneurysm measuring up to approximately 5.0 cm. Recommend follow-up CT/MR every 6 months and vascular consultation. This recommendation follows ACR consensus guidelines: White Paper of the ACR Incidental Findings Committee II on Vascular Findings. J Am Coll Radiol 2013; 16:109-604. 3.  Aortic Atherosclerosis (ICD10-I70.0).   COGNITION: Overall cognitive status: Within functional limits for tasks assessed  SENSATION: Light touch: Impaired   COORDINATION: Pt has good coordination  POSTURE: rounded shoulders, forward head, and decreased lumbar lordosis  LOWER EXTREMITY ROM:     Active  Right Eval Left Eval  Hip flexion    Hip extension    Hip abduction    Hip adduction     Hip internal rotation    Hip external rotation    Knee flexion    Knee extension    Ankle dorsiflexion    Ankle plantarflexion    Ankle inversion    Ankle eversion     (Blank rows = not tested)  LOWER EXTREMITY MMT:    MMT Right Eval Left Eval  Hip flexion 4- 5  Hip extension 4- 4-  Hip abduction 3+ 3+  Hip adduction 3+ 3+  Hip internal rotation    Hip external rotation    Knee flexion 4- 4+  Knee extension 4- 4+  Ankle dorsiflexion 4+ 5  Ankle plantarflexion    Ankle inversion    Ankle eversion    (Blank rows = not tested)   TRANSFERS: Assistive device utilized: None  Sit to stand: Complete Independence Stand to sit: Complete Independence   FUNCTIONAL TESTS:  5 times sit to stand: 30.77 sec Timed up and go (TUG): 13.20 sec 6 minute walk test: to be determined at next visit 10 meter walk test: 11.66 sec  PATIENT SURVEYS:  FOTO 47/59  TODAY'S TREATMENT: DATE: 12/27/22  Eval Only   PATIENT EDUCATION: Education details: Pt educated on role of PT and services provided during current POC, along with prognosis and information about the clinic. Person educated: Patient Education method: Explanation Education comprehension: verbalized understanding  HOME EXERCISE PROGRAM:  To be given at next appointment  GOALS: Goals reviewed with patient? Yes  SHORT TERM GOALS: Target date: 01/24/2023  Pt will be independent with HEP in order to demonstrate increased ability to perform tasks related to occupation/hobbies. Baseline:  To be given to pt at next appointment Goal status: INITIAL   LONG TERM GOALS: Target date: 03/21/2023  1.  Patient (> 56 years old) will complete five times sit to stand test in < 15 seconds indicating  an increased LE strength and improved balance. Baseline: 30.77 sec without use of hands Goal status: INITIAL  2.  Patient will increase FOTO score to equal to or greater than 59 to demonstrate statistically significant improvement in  mobility and quality of life.  Baseline: 12/27/22: 47 Goal status: INITIAL   3.  Patient will reduce timed up and go to <11 seconds to reduce fall risk and demonstrate improved transfer/gait ability. Baseline: 13.20 sec Goal status: INITIAL  4.  Patient will increase 10 meter walk test to >1.39m/s as to improve gait speed for better community ambulation and to reduce fall risk. Baseline: 11.66 sec/8.57 m/s Goal status: INITIAL  5.  Patient will increase six minute walk test distance to >1000 for progression to community ambulator and improve gait ability Baseline: to be performed at next visit Goal status: INITIAL   ASSESSMENT:  CLINICAL IMPRESSION: Patient is a 82 y.o. male who was seen today for physical therapy evaluation and treatment for imbalance and low back pain.  Pt currently stating that he would like to feel more confident in his ability to ambulate and not require the use of the cane if possible.  Pt notes that he also has chronic low back pain that he would like to be addressed as well.  During initial evaluation, balance deficits were assessed and pt advised that lumbar pain would be assessed at a subsequent visit.  Pt agreeable to this current plan and would benefit from increasing overall strength of the LE's as part of POC as well.  Pt demonstrates understanding of this plan of care and agrees with this plan.    OBJECTIVE IMPAIRMENTS: Abnormal gait, decreased activity tolerance, decreased balance, decreased knowledge of use of DME, difficulty walking, decreased ROM, decreased strength, impaired sensation, and pain.   ACTIVITY LIMITATIONS: carrying, lifting, bending, sitting, standing, squatting, and locomotion level  PARTICIPATION LIMITATIONS: cleaning, laundry, shopping, community activity, and yard work  PERSONAL FACTORS: Age, Fitness, Past/current experiences, Time since onset of injury/illness/exacerbation, and 3+ comorbidities: arthritis, cancer, depression, abdominal  aneurism  are also affecting patient's functional outcome.   REHAB POTENTIAL: Good  CLINICAL DECISION MAKING: Stable/uncomplicated  EVALUATION COMPLEXITY: Low  PLAN:  PT FREQUENCY: 1-2x/week  PT DURATION: 12 weeks  PLANNED INTERVENTIONS: Therapeutic exercises, Therapeutic activity, Neuromuscular re-education, Balance training, Gait training, Patient/Family education, Self Care, Joint mobilization, Joint manipulation, Stair training, Vestibular training, Canalith repositioning, DME instructions, Aquatic Therapy, Dry Needling, Spinal manipulation, Spinal mobilization, Cryotherapy, Moist heat, Taping, Ultrasound, Manual therapy, and Re-evaluation  PLAN FOR NEXT SESSION: complete , continue with assessment of lumbar region for pain complications that he is experiencing.    Nolon Bussing, PT, DPT Physical Therapist - [facility]  12/27/22, 3:32 PM

## 2023-01-01 ENCOUNTER — Ambulatory Visit: Payer: Medicare Other

## 2023-01-01 DIAGNOSIS — G8929 Other chronic pain: Secondary | ICD-10-CM

## 2023-01-01 DIAGNOSIS — R2689 Other abnormalities of gait and mobility: Secondary | ICD-10-CM | POA: Diagnosis not present

## 2023-01-01 DIAGNOSIS — M545 Low back pain, unspecified: Secondary | ICD-10-CM | POA: Diagnosis not present

## 2023-01-01 DIAGNOSIS — M5459 Other low back pain: Secondary | ICD-10-CM

## 2023-01-01 DIAGNOSIS — M5441 Lumbago with sciatica, right side: Secondary | ICD-10-CM | POA: Diagnosis not present

## 2023-01-01 NOTE — Therapy (Addendum)
OUTPATIENT PHYSICAL THERAPY Treatment   Patient Name: Adam Juarez MRN: [id number] DOB:1941/03/14, 82 y.o., male Today's Date: 01/01/2023   PCP: [name], NP  REFERRING PROVIDER: [name], NP  END OF SESSION:  PT End of Session - 01/01/23 1503     Visit Number 2    Number of Visits 24    Date for PT Re-Evaluation 03/21/23    PT Start Time 1503    PT Stop Time 1552    PT Time Calculation (min) 49 min    Equipment Utilized During Treatment Gait belt    Activity Tolerance Patient tolerated treatment well    Behavior During Therapy [facility] for tasks assessed/performed              Past Medical History:  Diagnosis Date   Arthritis    fingers   Cancer (HCC) 1991, 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 a-fib x 1 episode patient states he "auto corrected" seen at [facility]   GERD (gastroesophageal reflux disease)    Headache    poor posture - none recently   History of kidney stones    Hydrocephalus (HCC)    Hyperlipidemia    Neuropathy    PSVT (paroxysmal supraventricular tachycardia) 2009   Controlled with "breathing process"   Renal stone 9/08, 6/15   Wears dentures    full upper   Past Surgical History:  Procedure Laterality Date   CARDIAC CATHETERIZATION  2008   [place], [place]   CATARACT EXTRACTION [facility] Left 07/27/2015   Procedure: CATARACT EXTRACTION PHACO AND INTRAOCULAR LENS PLACEMENT (IOC);  Surgeon: Lockie Mola, MD;  Location: [facility];  Service: Ophthalmology;  Laterality: Left;  TORIC   CATARACT EXTRACTION W/PHACO Right 08/24/2015   Procedure: CATARACT EXTRACTION PHACO AND INTRAOCULAR LENS PLACEMENT (IOC);  Surgeon: Lockie Mola, MD;  Location: [facility];  Service: Ophthalmology;  Laterality: Right;  TORIC   COLONOSCOPY WITH PROPOFOL N/A 08/10/2020   Procedure: COLONOSCOPY WITH PROPOFOL;  Surgeon: Toledo, Boykin Nearing, MD;  Location: [facility]  ENDOSCOPY;  Service: Gastroenterology;  Laterality: N/A;   HEMORROIDECTOMY  2010   banding   IR ANGIO INTRA EXTRACRAN SEL COM CAROTID INNOMINATE BILAT MOD SED  10/30/2019   IR ANGIO VERTEBRAL SEL VERTEBRAL UNI R MOD SED  10/30/2019   IR RADIOLOGIST EVAL & MGMT  04/11/2020   IR US GUIDE VASC ACCESS RIGHT  10/30/2019   KIDNEY STONE SURGERY  9/08, 6/15   lithotripsy   LAPAROSCOPIC REVISION VENTRICULAR-PERITONEAL (V-P) SHUNT  01/08/2020   Procedure: LAPAROSCOPIC REVISION VENTRICULAR-PERITONEAL (V-P) SHUNT;  Surgeon: Lisbeth Renshaw, MD;  Location: [facility] OR;  Service: Neurosurgery;;   PROSTATE BIOPSY  2007   SKIN CANCER EXCISION     TOENAIL TRIMMING  11/2017   Dr. Orland Jarred   TONSILLECTOMY     VASECTOMY     VENTRICULOPERITONEAL SHUNT Right 01/08/2020   Procedure: SHUNT INSERTION VENTRICULAR-PERITONEAL;  Surgeon: Lisbeth Renshaw, MD;  Location: [facility] OR;  Service: Neurosurgery;  Laterality: Right;   Patient Active Problem List   Diagnosis Date Noted   Imbalance 12/07/2022   Paronychia of right index finger 07/16/2022   Foul smelling urine 06/22/2022   Right lower quadrant abdominal pain 06/22/2022   Sensorineural hearing loss, bilateral 05/25/2022   Gastroesophageal reflux disease 05/25/2022   Other specified counseling 05/25/2022   (Idiopathic) normal pressure hydrocephalus (HCC) 05/25/2022   Paroxysmal tachycardia (HCC) 05/25/2022   Elevated  bilirubin 08/04/2021   History of macular degeneration 06/22/2021   Dizziness 12/01/2020   Chronic abdominal pain 11/10/2020   Proctalgia 10/27/2020   Hemorrhoids with complication 10/27/2020   Hemorrhoids 07/21/2020   Colon cancer screening 07/14/2020   Chronic idiopathic constipation 04/21/2020   Prolapsed internal hemorrhoids, grade 3 04/21/2020   VP (ventriculoperitoneal) shunt status 04/21/2020   Bilateral lower abdominal pain 04/21/2020   Abdominal pain, generalized 02/09/2020   Normal pressure hydrocephalus (HCC) 01/08/2020   Excessive  daytime sleepiness 10/19/2019   Hip pain 07/15/2019   Medicare annual wellness visit, subsequent 02/06/2019   Memory changes 11/21/2018   Aortic atherosclerosis (HCC) 03/27/2018   PAD (peripheral artery disease) (HCC) 03/27/2018   Smoking history 03/27/2018   Chronic left shoulder pain 03/13/2018   History of systemic reaction to bee sting 01/28/2018   Prediabetes 01/28/2018   History of elevated PSA 01/28/2018   AAA (abdominal aortic aneurysm) (HCC) 05/22/2017   Osteoarthritis 07/13/2016   Chronic foot pain 07/13/2016   Encounter for abdominal aortic aneurysm (AAA) screening 02/17/2016   Mixed hyperlipidemia 11/27/2015   Chronic back pain 11/27/2015   Chronic fatigue 11/27/2015   SVT (supraventricular tachycardia) 09/16/2015   Paroxysmal atrial fibrillation (HCC) 09/16/2015   Frequent PVCs 09/16/2015   Esophageal reflux 09/16/2015   Preventative health care 09/16/2015   Encounter for general adult medical examination without abnormal findings 09/16/2015   Hypertriglyceridemia 04/21/2015    ONSET DATE: Balance: 3-4 months; Back pain: years  REFERRING DIAG:  M54.50,G89.29 (ICD-10-CM) - Chronic right-sided low back pain without sciatica R26.89 (ICD-10-CM) - Imbalance    THERAPY DIAG:  Imbalance  Other low back pain  Chronic midline low back pain with right-sided sciatica  Rationale for Evaluation and Treatment: Rehabilitation  SUBJECTIVE:                                                                                                                                                                                             SUBJECTIVE STATEMENT: No falls for the past 6 months. Back pain is his priority. Feels like it is R sciatica (R posterior hip to posterior mid thigh). Has self diagnosis of neuropathy B plantar foot which bothers him the most currently.  Back pain mainly on R side      Pt accompanied by: self  PERTINENT HISTORY: Pt notes that he has been recently  having some increased difficulty with balance over the past 3-4 months.  Pt has been utilizing a cane for the past 2-3 weeks.  Pt notes that he was not using the cane for balance, but due to the pain he was experiencing in  the back that shoots into the LE.  Pt states he has progressed to having to use the cane to catch himself when the pain hits.  Pt has neuropathy in his feet, has been taking medication for it and he believe it is starting to help.    PAIN:  Are you having pain? Yes: NPRS scale: 1.38/10 Pain location: low back Pain description: dull, radiating to the L LE Aggravating factors: Getting up from seated position Relieving factors: Standing/walking Best: 0/10 Worst: 9/10, but does not last long  PRECAUTIONS: Other: shunt in the cranial region, for pressure relief abdominal aortic aneurysm     RED FLAGS: Bowel or bladder incontinence: Yes: pt reports due to old age. and Abdominal aneurysm: Yes: first discovered when he retired from the [employer] BEARING RESTRICTIONS: No  FALLS: Has patient fallen in last 6 months? Yes. Number of falls 1  LIVING ENVIRONMENT: Lives with: lives with their spouse Lives in: House/apartment Stairs: No Has following equipment at home: Single point cane, shower chair, and Grab bars  PLOF: Independent  PATIENT GOALS:  Improve the back pain so it does not immobilize him.     OBJECTIVE:   DIAGNOSTIC FINDINGS:   EXAM: LUMBAR SPINE - COMPLETE 4+ VIEW  IMPRESSION: 1. Multilevel degenerative changes of the lumbar spine, most pronounced at L3-L4. 2. Fusiform infrarenal abdominal aortic aneurysm measuring up to approximately 5.0 cm. Recommend follow-up CT/MR every 6 months and vascular consultation. This recommendation follows ACR consensus guidelines: White Paper of the ACR Incidental Findings Committee II on Vascular Findings. J Am Coll Radiol 2013; 86:578-469. 3.  Aortic Atherosclerosis (ICD10-I70.0).   COGNITION: Overall  cognitive status: Within functional limits for tasks assessed  SENSATION: Light touch: Impaired   COORDINATION: Pt has good coordination  POSTURE: rounded shoulders, forward head, and decreased lumbar lordosis  LOWER EXTREMITY ROM:     PROM Right 01/01/2023 Left 01/01/2023  Hip flexion    Hip extension    Hip abduction    Hip adduction    Hip internal rotation 27 41  Hip external rotation    Knee flexion    Knee extension    Ankle dorsiflexion    Ankle plantarflexion    Ankle inversion    Ankle eversion     (Blank rows = not tested)  LOWER EXTREMITY MMT:    MMT Right Eval Left Eval  Hip flexion 4- 5  Hip extension 4- 4-  Hip abduction 3+ 3+  Hip adduction 3+ 3+  Hip internal rotation    Hip external rotation    Knee flexion 4- 4+  Knee extension 4- 4+  Ankle dorsiflexion 4+ 5  Ankle plantarflexion    Ankle inversion    Ankle eversion    (Blank rows = not tested)   LUMBAR ROM:   Active  AROM  01/01/2023  Flexion Limited with R low back pain  Extension Limited with reproduction or R low back pain   Right lateral flexion Limited with R low back pain  Left lateral flexion Limited with R low back pain (symptoms start earlier compared to R lateral flexion)  Right rotation WFL with slight R low back symptoms.  Left rotation WFL with slight R low back symptoms   (Blank rows = not tested)      TRANSFERS: Assistive device utilized: None  Sit to stand: Complete Independence Stand to sit: Complete Independence   FUNCTIONAL TESTS:  5 times sit to stand: 30.77 sec Timed up and go (  TUG): 13.20 sec 6 minute walk test: 1072 ft (01/01/2023) 10 meter walk test: 11.66 sec  (+) repeated flexion test with reproduction of symptoms.     PATIENT SURVEYS:  FOTO 47/59  TODAY'S TREATMENT: DATE: 01/01/23   Therapeutic exercise  TTP R low back paraspinal muscle, posterior iliac crest, and R piriformis muscle.   Standing lumbar AROM all planes  Gait x 6  minutes Antalgic, decreased stance R LE  1072 ft  Supine lower trunk rotation 10x3 each LE  Supine hip IR at 90/90 1x each LE  Supine R hip IR with PT 1 minute x 3  Hooklying SKTC   R 10x5 seconds     Improved exercise technique, movement at target joints, use of target muscles after mod verbal, visual, tactile cues.   Fair tolerance to today's session.         PATIENT EDUCATION: Education details: there-ex, HEP Person educated: Patient Education method: Explanation Education comprehension: verbalized understanding  HOME EXERCISE PROGRAM: Access Code: [email or web address] URL: [email or web address] Date: 01/01/2023 Prepared by: Loralyn Freshwater  Exercises - Supine Lower Trunk Rotation  - 1 x daily - 7 x weekly - 3 sets - 10 reps    To be given at next appointment  GOALS: Goals reviewed with patient? Yes  SHORT TERM GOALS: Target date: 01/24/2023  Pt will be independent with HEP in order to demonstrate increased ability to perform tasks related to occupation/hobbies. Baseline:  To be given to pt at next appointment Goal status: INITIAL   LONG TERM GOALS: Target date: 03/21/2023  1.  Patient (> 41 years old) will complete five times sit to stand test in < 15 seconds indicating an increased LE strength and improved balance. Baseline: 30.77 sec without use of hands Goal status: INITIAL  2.  Patient will increase FOTO score to equal to or greater than 59 to demonstrate statistically significant improvement in mobility and quality of life.  Baseline: 12/27/22: 47 Goal status: INITIAL   3.  Patient will reduce timed up and go to <11 seconds to reduce fall risk and demonstrate improved transfer/gait ability. Baseline: 13.20 sec Goal status: INITIAL  4.  Patient will increase 10 meter walk test to >1.28m/s as to improve gait speed for better community ambulation and to reduce fall risk. Baseline: 11.66 sec/8.57 m/s Goal status: INITIAL  5.  Patient will  increase six minute walk test distance to >1000 for progression to community ambulator and improve gait ability Baseline: to be performed at next visit Goal status: INITIAL   ASSESSMENT:  CLINICAL IMPRESSION: Worked on improving trunk mobility, R hip IR rotation ROM, and decreasing extension stress to low back to help decrease low back pain. Fair tolerance to today's session. Pt will benefit from continued skilled physical therapy services to decrease pain, improve strength, balance, and function.      OBJECTIVE IMPAIRMENTS: Abnormal gait, decreased activity tolerance, decreased balance, decreased knowledge of use of DME, difficulty walking, decreased ROM, decreased strength, impaired sensation, and pain.   ACTIVITY LIMITATIONS: carrying, lifting, bending, sitting, standing, squatting, and locomotion level  PARTICIPATION LIMITATIONS: cleaning, laundry, shopping, community activity, and yard work  PERSONAL FACTORS: Age, Fitness, Past/current experiences, Time since onset of injury/illness/exacerbation, and 3+ comorbidities: arthritis, cancer, depression, abdominal aneurism  are also affecting patient's functional outcome.   REHAB POTENTIAL: Good  CLINICAL DECISION MAKING: Stable/uncomplicated  EVALUATION COMPLEXITY: Low  PLAN:  PT FREQUENCY: 1-2x/week  PT DURATION: 12 weeks  PLANNED INTERVENTIONS: Therapeutic exercises, Therapeutic activity, Neuromuscular re-education, Balance training,  Gait training, Patient/Family education, Self Care, Joint mobilization, Joint manipulation, Stair training, Vestibular training, Canalith repositioning, DME instructions, Aquatic Therapy, Dry Needling, Spinal manipulation, Spinal mobilization, Cryotherapy, Moist heat, Taping, Ultrasound, Manual therapy, and Re-evaluation  PLAN FOR NEXT SESSION: complete , continue with assessment of lumbar region for pain complications that he is experiencing.  Loralyn Freshwater PT, DPT Physical Therapist - [facility]  01/01/23, 3:55 PM

## 2023-01-04 ENCOUNTER — Other Ambulatory Visit (INDEPENDENT_AMBULATORY_CARE_PROVIDER_SITE_OTHER): Payer: Self-pay | Admitting: Vascular Surgery

## 2023-01-04 ENCOUNTER — Ambulatory Visit: Payer: Medicare Other

## 2023-01-04 DIAGNOSIS — M545 Low back pain, unspecified: Secondary | ICD-10-CM | POA: Diagnosis not present

## 2023-01-04 DIAGNOSIS — G8929 Other chronic pain: Secondary | ICD-10-CM

## 2023-01-04 DIAGNOSIS — R2689 Other abnormalities of gait and mobility: Secondary | ICD-10-CM

## 2023-01-04 DIAGNOSIS — I7143 Infrarenal abdominal aortic aneurysm, without rupture: Secondary | ICD-10-CM

## 2023-01-04 DIAGNOSIS — M5459 Other low back pain: Secondary | ICD-10-CM | POA: Diagnosis not present

## 2023-01-04 DIAGNOSIS — I723 Aneurysm of iliac artery: Secondary | ICD-10-CM

## 2023-01-04 DIAGNOSIS — M5441 Lumbago with sciatica, right side: Secondary | ICD-10-CM | POA: Diagnosis not present

## 2023-01-04 NOTE — Therapy (Signed)
OUTPATIENT PHYSICAL THERAPY Treatment   Patient Name: Adam Juarez MRN: [id number] DOB:08-15-1940, 82 y.o., male Today's Date: 01/04/2023   PCP: [name], NP  REFERRING PROVIDER: [name], NP  END OF SESSION:  PT End of Session - 01/04/23 0951     Visit Number 3    Number of Visits 24    Date for PT Re-Evaluation 03/21/23    PT Start Time 0951    PT Stop Time 1031    PT Time Calculation (min) 40 min    Equipment Utilized During Treatment --    Activity Tolerance Patient tolerated treatment well    Behavior During Therapy [facility] for tasks assessed/performed               Past Medical History:  Diagnosis Date   Arthritis    fingers   Cancer (HCC) 1991, 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 a-fib x 1 episode patient states he "auto corrected" seen at [facility]   GERD (gastroesophageal reflux disease)    Headache    poor posture - none recently   History of kidney stones    Hydrocephalus (HCC)    Hyperlipidemia    Neuropathy    PSVT (paroxysmal supraventricular tachycardia) 2009   Controlled with "breathing process"   Renal stone 9/08, 6/15   Wears dentures    full upper   Past Surgical History:  Procedure Laterality Date   CARDIAC CATHETERIZATION  2008   [place], [place]   CATARACT EXTRACTION [facility] Left 07/27/2015   Procedure: CATARACT EXTRACTION PHACO AND INTRAOCULAR LENS PLACEMENT (IOC);  Surgeon: Lockie Mola, MD;  Location: [facility];  Service: Ophthalmology;  Laterality: Left;  TORIC   CATARACT EXTRACTION W/PHACO Right 08/24/2015   Procedure: CATARACT EXTRACTION PHACO AND INTRAOCULAR LENS PLACEMENT (IOC);  Surgeon: Lockie Mola, MD;  Location: [facility];  Service: Ophthalmology;  Laterality: Right;  TORIC   COLONOSCOPY WITH PROPOFOL N/A 08/10/2020   Procedure: COLONOSCOPY WITH PROPOFOL;  Surgeon: Toledo, Boykin Nearing, MD;  Location: [facility] END[facility];   Service: Gastroenterology;  Laterality: N/A;   HEMORROIDECTOMY  2010   banding   IR ANGIO INTRA EXTRACRAN SEL COM CAROTID INNOMINATE BILAT MOD SED  10/30/2019   IR ANGIO VERTEBRAL SEL VERTEBRAL UNI R MOD SED  10/30/2019   IR RADIOLOGIST EVAL & MGMT  04/11/2020   IR US GUIDE VASC ACCESS RIGHT  10/30/2019   KIDNEY STONE SURGERY  9/08, 6/15   lithotripsy   LAPAROSCOPIC REVISION VENTRICULAR-PERITONEAL (V-P) SHUNT  01/08/2020   Procedure: LAPAROSCOPIC REVISION VENTRICULAR-PERITONEAL (V-P) SHUNT;  Surgeon: Lisbeth Renshaw, MD;  Location: [facility] OR;  Service: Neurosurgery;;   PROSTATE BIOPSY  2007   SKIN CANCER EXCISION     TOENAIL TRIMMING  11/2017   Dr. Orland Jarred   TONSILLECTOMY     VASECTOMY     VENTRICULOPERITONEAL SHUNT Right 01/08/2020   Procedure: SHUNT INSERTION VENTRICULAR-PERITONEAL;  Surgeon: Lisbeth Renshaw, MD;  Location: [facility] OR;  Service: Neurosurgery;  Laterality: Right;   Patient Active Problem List   Diagnosis Date Noted   Imbalance 12/07/2022   Paronychia of right index finger 07/16/2022   Foul smelling urine 06/22/2022   Right lower quadrant abdominal pain 06/22/2022   Sensorineural hearing loss, bilateral 05/25/2022   Gastroesophageal reflux disease 05/25/2022   Other specified counseling 05/25/2022   (Idiopathic) normal pressure hydrocephalus (HCC) 05/25/2022   Paroxysmal tachycardia (HCC) 05/25/2022   Elevated  bilirubin 08/04/2021   History of macular degeneration 06/22/2021   Dizziness 12/01/2020   Chronic abdominal pain 11/10/2020   Proctalgia 10/27/2020   Hemorrhoids with complication 10/27/2020   Hemorrhoids 07/21/2020   Colon cancer screening 07/14/2020   Chronic idiopathic constipation 04/21/2020   Prolapsed internal hemorrhoids, grade 3 04/21/2020   VP (ventriculoperitoneal) shunt status 04/21/2020   Bilateral lower abdominal pain 04/21/2020   Abdominal pain, generalized 02/09/2020   Normal pressure hydrocephalus (HCC) 01/08/2020   Excessive daytime  sleepiness 10/19/2019   Hip pain 07/15/2019   Medicare annual wellness visit, subsequent 02/06/2019   Memory changes 11/21/2018   Aortic atherosclerosis (HCC) 03/27/2018   PAD (peripheral artery disease) (HCC) 03/27/2018   Smoking history 03/27/2018   Chronic left shoulder pain 03/13/2018   History of systemic reaction to bee sting 01/28/2018   Prediabetes 01/28/2018   History of elevated PSA 01/28/2018   AAA (abdominal aortic aneurysm) (HCC) 05/22/2017   Osteoarthritis 07/13/2016   Chronic foot pain 07/13/2016   Encounter for abdominal aortic aneurysm (AAA) screening 02/17/2016   Mixed hyperlipidemia 11/27/2015   Chronic back pain 11/27/2015   Chronic fatigue 11/27/2015   SVT (supraventricular tachycardia) 09/16/2015   Paroxysmal atrial fibrillation (HCC) 09/16/2015   Frequent PVCs 09/16/2015   Esophageal reflux 09/16/2015   Preventative health care 09/16/2015   Encounter for general adult medical examination without abnormal findings 09/16/2015   Hypertriglyceridemia 04/21/2015    ONSET DATE: Balance: 3-4 months; Back pain: years  REFERRING DIAG:  M54.50,G89.29 (ICD-10-CM) - Chronic right-sided low back pain without sciatica R26.89 (ICD-10-CM) - Imbalance    THERAPY DIAG:  Imbalance  Other low back pain  Chronic midline low back pain with right-sided sciatica  Rationale for Evaluation and Treatment: Rehabilitation  SUBJECTIVE:                                                                                                                                                                                             SUBJECTIVE STATEMENT: Stiff this morning. 4/10 currently when sitting, 7/10 currently when standing and walking.      Pt accompanied by: self  PERTINENT HISTORY: Pt notes that he has been recently having some increased difficulty with balance over the past 3-4 months.  Pt has been utilizing a cane for the past 2-3 weeks.  Pt notes that he was not using  the cane for balance, but due to the pain he was experiencing in the back that shoots into the LE.  Pt states he has progressed to having to use the cane to catch himself when the pain hits.  Pt has neuropathy in his feet,  has been taking medication for it and he believe it is starting to help.    PAIN:  Are you having pain? Yes: NPRS scale: 1.38/10 Pain location: low back Pain description: dull, radiating to the L LE Aggravating factors: Getting up from seated position Relieving factors: Standing/walking Best: 0/10 Worst: 9/10, but does not last long  PRECAUTIONS: Other: shunt in the cranial region, for pressure relief abdominal aortic aneurysm     RED FLAGS: Bowel or bladder incontinence: Yes: pt reports due to old age. and Abdominal aneurysm: Yes: first discovered when he retired from the [employer] BEARING RESTRICTIONS: No  FALLS: Has patient fallen in last 6 months? Yes. Number of falls 1  LIVING ENVIRONMENT: Lives with: lives with their spouse Lives in: House/apartment Stairs: No Has following equipment at home: Single point cane, shower chair, and Grab bars  PLOF: Independent  PATIENT GOALS:  Improve the back pain so it does not immobilize him.     OBJECTIVE:   DIAGNOSTIC FINDINGS:   EXAM: LUMBAR SPINE - COMPLETE 4+ VIEW  IMPRESSION: 1. Multilevel degenerative changes of the lumbar spine, most pronounced at L3-L4. 2. Fusiform infrarenal abdominal aortic aneurysm measuring up to approximately 5.0 cm. Recommend follow-up CT/MR every 6 months and vascular consultation. This recommendation follows ACR consensus guidelines: White Paper of the ACR Incidental Findings Committee II on Vascular Findings. J Am Coll Radiol 2013; 65:784-696. 3.  Aortic Atherosclerosis (ICD10-I70.0).   COGNITION: Overall cognitive status: Within functional limits for tasks assessed  SENSATION: Light touch: Impaired   COORDINATION: Pt has good coordination  POSTURE:  rounded shoulders, forward head, and decreased lumbar lordosis  LOWER EXTREMITY ROM:     PROM Right 01/01/2023 Left 01/01/2023  Hip flexion    Hip extension    Hip abduction    Hip adduction    Hip internal rotation 27 41  Hip external rotation    Knee flexion    Knee extension    Ankle dorsiflexion    Ankle plantarflexion    Ankle inversion    Ankle eversion     (Blank rows = not tested)  LOWER EXTREMITY MMT:    MMT Right Eval Left Eval  Hip flexion 4- 5  Hip extension 4- 4-  Hip abduction 3+ 3+  Hip adduction 3+ 3+  Hip internal rotation    Hip external rotation    Knee flexion 4- 4+  Knee extension 4- 4+  Ankle dorsiflexion 4+ 5  Ankle plantarflexion    Ankle inversion    Ankle eversion    (Blank rows = not tested)   LUMBAR ROM:   Active  AROM  01/01/2023  Flexion Limited with R low back pain  Extension Limited with reproduction or R low back pain   Right lateral flexion Limited with R low back pain  Left lateral flexion Limited with R low back pain (symptoms start earlier compared to R lateral flexion)  Right rotation WFL with slight R low back symptoms.  Left rotation WFL with slight R low back symptoms   (Blank rows = not tested)      TRANSFERS: Assistive device utilized: None  Sit to stand: Complete Independence Stand to sit: Complete Independence   FUNCTIONAL TESTS:  5 times sit to stand: 30.77 sec Timed up and go (TUG): 13.20 sec 6 minute walk test: 1072 ft (01/01/2023) 10 meter walk test: 11.66 sec  (+) repeated flexion test with reproduction of symptoms.     PATIENT SURVEYS:  FOTO  47/59  TODAY'S TREATMENT: DATE: 01/04/23   Therapeutic exercise   Supine R hip IR with PT 1 minute x 3  Supine with R hip in 90/90 position   R hip AAROM 10x3  Decreased R low back pain to 2-3/10 after aforementioned exercises  Seated R hip IR stretch with R foot on step with R lumbar flexion to stretch piriformis muscle on R 30 seconds x  3  Seated hip IR  R 10x3 with 5 second holds   Seated hip adduction isometrics small blue ball squeeze 10x5 seconds for 2 sets   Reclined   Hooklying hip extension with leg straight    R 6x5 seconds   No rear end muscle use felt  Standing mini lunge with contralateral UE assist   R 10x    Improved exercise technique, movement at target joints, use of target muscles after mod verbal, visual, tactile cues.     Decreased R low back pain reported after treatment         PATIENT EDUCATION: Education details: there-ex, HEP Person educated: Patient Education method: Explanation Education comprehension: verbalized understanding  HOME EXERCISE PROGRAM: Access Code: [email or web address] URL: [email or web address] Date: 01/01/2023 Prepared by: Loralyn Freshwater  Exercises - Supine Lower Trunk Rotation  - 1 x daily - 7 x weekly - 3 sets - 10 reps   - Seated Hip Internal Rotation AROM  - 1 x daily - 7 x weekly - 3 sets - 10 reps       GOALS: Goals reviewed with patient? Yes  SHORT TERM GOALS: Target date: 01/24/2023  Pt will be independent with HEP in order to demonstrate increased ability to perform tasks related to occupation/hobbies. Baseline:  To be given to pt at next appointment Goal status: INITIAL   LONG TERM GOALS: Target date: 03/21/2023  1.  Patient (> 96 years old) will complete five times sit to stand test in < 15 seconds indicating an increased LE strength and improved balance. Baseline: 30.77 sec without use of hands Goal status: INITIAL  2.  Patient will increase FOTO score to equal to or greater than 59 to demonstrate statistically significant improvement in mobility and quality of life.  Baseline: 12/27/22: 47 Goal status: INITIAL   3.  Patient will reduce timed up and go to <11 seconds to reduce fall risk and demonstrate improved transfer/gait ability. Baseline: 13.20 sec Goal status: INITIAL  4.  Patient will increase 10 meter walk test  to >1.61m/s as to improve gait speed for better community ambulation and to reduce fall risk. Baseline: 11.66 sec/8.57 m/s Goal status: INITIAL  5.  Patient will increase six minute walk test distance to >1000 for progression to community ambulator and improve gait ability Baseline: to be performed at next visit; 1072 ft (01/01/2023) Goal status: INITIAL   ASSESSMENT:  CLINICAL IMPRESSION:  Decreased R low back pain to 1.25/10 after session with standing and ambulation after treatment to decrease R piriformis muscle tension and improving R hip IR.  Pt will benefit from continued skilled physical therapy services to decrease pain, improve strength, balance, and function.      OBJECTIVE IMPAIRMENTS: Abnormal gait, decreased activity tolerance, decreased balance, decreased knowledge of use of DME, difficulty walking, decreased ROM, decreased strength, impaired sensation, and pain.   ACTIVITY LIMITATIONS: carrying, lifting, bending, sitting, standing, squatting, and locomotion level  PARTICIPATION LIMITATIONS: cleaning, laundry, shopping, community activity, and yard work  PERSONAL FACTORS: Age, Fitness, Past/current experiences, Time since onset of injury/illness/exacerbation, and 3+ comorbidities:  arthritis, cancer, depression, abdominal aneurism  are also affecting patient's functional outcome.   REHAB POTENTIAL: Good  CLINICAL DECISION MAKING: Stable/uncomplicated  EVALUATION COMPLEXITY: Low  PLAN:  PT FREQUENCY: 1-2x/week  PT DURATION: 12 weeks  PLANNED INTERVENTIONS: Therapeutic exercises, Therapeutic activity, Neuromuscular re-education, Balance training, Gait training, Patient/Family education, Self Care, Joint mobilization, Joint manipulation, Stair training, Vestibular training, Canalith repositioning, DME instructions, Aquatic Therapy, Dry Needling, Spinal manipulation, Spinal mobilization, Cryotherapy, Moist heat, Taping, Ultrasound, Manual therapy, and  Re-evaluation  PLAN FOR NEXT SESSION: complete , continue with assessment of lumbar region for pain complications that he is experiencing.  Loralyn Freshwater PT, DPT Physical Therapist - [facility]  01/04/23, 11:35 AM

## 2023-01-07 ENCOUNTER — Ambulatory Visit: Payer: Medicare Other

## 2023-01-07 DIAGNOSIS — M5441 Lumbago with sciatica, right side: Secondary | ICD-10-CM | POA: Diagnosis not present

## 2023-01-07 DIAGNOSIS — R2689 Other abnormalities of gait and mobility: Secondary | ICD-10-CM | POA: Diagnosis not present

## 2023-01-07 DIAGNOSIS — G8929 Other chronic pain: Secondary | ICD-10-CM

## 2023-01-07 DIAGNOSIS — M5459 Other low back pain: Secondary | ICD-10-CM | POA: Diagnosis not present

## 2023-01-07 DIAGNOSIS — M545 Low back pain, unspecified: Secondary | ICD-10-CM | POA: Diagnosis not present

## 2023-01-07 NOTE — Therapy (Signed)
OUTPATIENT PHYSICAL THERAPY Treatment   Patient Name: Adam Juarez MRN: [id number] DOB:[birth year], 82 y.o., male Today's Date: 01/07/2023   PCP: [name], NP  REFERRING PROVIDER: [name], NP  END OF SESSION:  PT End of Session - 01/07/23 1036     Visit Number 4    Number of Visits 24    Date for PT Re-Evaluation 03/21/23    PT Start Time 1036    PT Stop Time 1116    PT Time Calculation (min) 40 min    Activity Tolerance Patient tolerated treatment well    Behavior During Therapy [facility] for tasks assessed/performed                Past Medical History:  Diagnosis Date   Arthritis    fingers   Cancer (HCC) 1991, 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 a-fib x 1 episode patient states he "auto corrected" seen at [facility]   GERD (gastroesophageal reflux disease)    Headache    poor posture - none recently   History of kidney stones    Hydrocephalus (HCC)    Hyperlipidemia    Neuropathy    PSVT (paroxysmal supraventricular tachycardia) 2009   Controlled with "breathing process"   Renal stone 9/08, 6/15   Wears dentures    full upper   Past Surgical History:  Procedure Laterality Date   CARDIAC CATHETERIZATION  2008   [place], [place]   CATARACT EXTRACTION [facility] Left 07/27/2015   Procedure: CATARACT EXTRACTION PHACO AND INTRAOCULAR LENS PLACEMENT (IOC);  Surgeon: Lockie Mola, MD;  Location: [facility];  Service: Ophthalmology;  Laterality: Left;  TORIC   CATARACT EXTRACTION W/PHACO Right 08/24/2015   Procedure: CATARACT EXTRACTION PHACO AND INTRAOCULAR LENS PLACEMENT (IOC);  Surgeon: Lockie Mola, MD;  Location: [facility];  Service: Ophthalmology;  Laterality: Right;  TORIC   COLONOSCOPY WITH PROPOFOL N/A 08/10/2020   Procedure: COLONOSCOPY WITH PROPOFOL;  Surgeon: Toledo, Boykin Nearing, MD;  Location: [facility] ENDOSCOPY;  Service: Gastroenterology;  Laterality:  N/A;   HEMORROIDECTOMY  2010   banding   IR ANGIO INTRA EXTRACRAN SEL COM CAROTID INNOMINATE BILAT MOD SED  10/30/2019   IR ANGIO VERTEBRAL SEL VERTEBRAL UNI R MOD SED  10/30/2019   IR RADIOLOGIST EVAL & MGMT  04/11/2020   IR US GUIDE VASC ACCESS RIGHT  10/30/2019   KIDNEY STONE SURGERY  9/08, 6/15   lithotripsy   LAPAROSCOPIC REVISION VENTRICULAR-PERITONEAL (V-P) SHUNT  01/08/2020   Procedure: LAPAROSCOPIC REVISION VENTRICULAR-PERITONEAL (V-P) SHUNT;  Surgeon: Lisbeth Renshaw, MD;  Location: [facility] OR;  Service: Neurosurgery;;   PROSTATE BIOPSY  2007   SKIN CANCER EXCISION     TOENAIL TRIMMING  11/2017   Dr. Orland Jarred   TONSILLECTOMY     VASECTOMY     VENTRICULOPERITONEAL SHUNT Right 01/08/2020   Procedure: SHUNT INSERTION VENTRICULAR-PERITONEAL;  Surgeon: Lisbeth Renshaw, MD;  Location: [facility] OR;  Service: Neurosurgery;  Laterality: Right;   Patient Active Problem List   Diagnosis Date Noted   Imbalance 12/07/2022   Paronychia of right index finger 07/16/2022   Foul smelling urine 06/22/2022   Right lower quadrant abdominal pain 06/22/2022   Sensorineural hearing loss, bilateral 05/25/2022   Gastroesophageal reflux disease 05/25/2022   Other specified counseling 05/25/2022   (Idiopathic) normal pressure hydrocephalus (HCC) 05/25/2022   Paroxysmal tachycardia (HCC) 05/25/2022   Elevated bilirubin 08/04/2021   History of macular  degeneration 06/22/2021   Dizziness 12/01/2020   Chronic abdominal pain 11/10/2020   Proctalgia 10/27/2020   Hemorrhoids with complication 10/27/2020   Hemorrhoids 07/21/2020   Colon cancer screening 07/14/2020   Chronic idiopathic constipation 04/21/2020   Prolapsed internal hemorrhoids, grade 3 04/21/2020   VP (ventriculoperitoneal) shunt status 04/21/2020   Bilateral lower abdominal pain 04/21/2020   Abdominal pain, generalized 02/09/2020   Normal pressure hydrocephalus (HCC) 01/08/2020   Excessive daytime sleepiness 10/19/2019   Hip pain 07/15/2019    Medicare annual wellness visit, subsequent 02/06/2019   Memory changes 11/21/2018   Aortic atherosclerosis (HCC) 03/27/2018   PAD (peripheral artery disease) (HCC) 03/27/2018   Smoking history 03/27/2018   Chronic left shoulder pain 03/13/2018   History of systemic reaction to bee sting 01/28/2018   Prediabetes 01/28/2018   History of elevated PSA 01/28/2018   AAA (abdominal aortic aneurysm) (HCC) 05/22/2017   Osteoarthritis 07/13/2016   Chronic foot pain 07/13/2016   Encounter for abdominal aortic aneurysm (AAA) screening 02/17/2016   Mixed hyperlipidemia 11/27/2015   Chronic back pain 11/27/2015   Chronic fatigue 11/27/2015   SVT (supraventricular tachycardia) 09/16/2015   Paroxysmal atrial fibrillation (HCC) 09/16/2015   Frequent PVCs 09/16/2015   Esophageal reflux 09/16/2015   Preventative health care 09/16/2015   Encounter for general adult medical examination without abnormal findings 09/16/2015   Hypertriglyceridemia 04/21/2015    ONSET DATE: Balance: 3-4 months; Back pain: years  REFERRING DIAG:  M54.50,G89.29 (ICD-10-CM) - Chronic right-sided low back pain without sciatica R26.89 (ICD-10-CM) - Imbalance    THERAPY DIAG:  Imbalance  Other low back pain  Chronic midline low back pain with right-sided sciatica  Rationale for Evaluation and Treatment: Rehabilitation  SUBJECTIVE:                                                                                                                                                                                             SUBJECTIVE STATEMENT: R low back is up and down. 4/10 R low back pain currently. R low back loosened up a little bit later on after last session.        Pt accompanied by: self  PERTINENT HISTORY: Pt notes that he has been recently having some increased difficulty with balance over the past 3-4 months.  Pt has been utilizing a cane for the past 2-3 weeks.  Pt notes that he was not using the cane  for balance, but due to the pain he was experiencing in the back that shoots into the LE.  Pt states he has progressed to having to use the cane to catch himself when the pain  hits.  Pt has neuropathy in his feet, has been taking medication for it and he believe it is starting to help.    PAIN:  Are you having pain? Yes: NPRS scale: 1.38/10 Pain location: low back Pain description: dull, radiating to the L LE Aggravating factors: Getting up from seated position Relieving factors: Standing/walking Best: 0/10 Worst: 9/10, but does not last long  PRECAUTIONS: Other: shunt in the cranial region, for pressure relief abdominal aortic aneurysm     RED FLAGS: Bowel or bladder incontinence: Yes: pt reports due to old age. and Abdominal aneurysm: Yes: first discovered when he retired from the [employer] BEARING RESTRICTIONS: No  FALLS: Has patient fallen in last 6 months? Yes. Number of falls 1  LIVING ENVIRONMENT: Lives with: lives with their spouse Lives in: House/apartment Stairs: No Has following equipment at home: Single point cane, shower chair, and Grab bars  PLOF: Independent  PATIENT GOALS:  Improve the back pain so it does not immobilize him.     OBJECTIVE:   DIAGNOSTIC FINDINGS:   EXAM: LUMBAR SPINE - COMPLETE 4+ VIEW  IMPRESSION: 1. Multilevel degenerative changes of the lumbar spine, most pronounced at L3-L4. 2. Fusiform infrarenal abdominal aortic aneurysm measuring up to approximately 5.0 cm. Recommend follow-up CT/MR every 6 months and vascular consultation. This recommendation follows ACR consensus guidelines: White Paper of the ACR Incidental Findings Committee II on Vascular Findings. J Am Coll Radiol 2013; 08:657-846. 3.  Aortic Atherosclerosis (ICD10-I70.0).   COGNITION: Overall cognitive status: Within functional limits for tasks assessed  SENSATION: Light touch: Impaired   COORDINATION: Pt has good coordination  POSTURE: rounded  shoulders, forward head, and decreased lumbar lordosis  LOWER EXTREMITY ROM:     PROM Right 01/01/2023 Left 01/01/2023  Hip flexion    Hip extension    Hip abduction    Hip adduction    Hip internal rotation 27 41  Hip external rotation    Knee flexion    Knee extension    Ankle dorsiflexion    Ankle plantarflexion    Ankle inversion    Ankle eversion     (Blank rows = not tested)  LOWER EXTREMITY MMT:    MMT Right Eval Left Eval  Hip flexion 4- 5  Hip extension 4- 4-  Hip abduction 3+ 3+  Hip adduction 3+ 3+  Hip internal rotation    Hip external rotation    Knee flexion 4- 4+  Knee extension 4- 4+  Ankle dorsiflexion 4+ 5  Ankle plantarflexion    Ankle inversion    Ankle eversion    (Blank rows = not tested)   LUMBAR ROM:   Active  AROM  01/01/2023  Flexion Limited with R low back pain  Extension Limited with reproduction or R low back pain   Right lateral flexion Limited with R low back pain  Left lateral flexion Limited with R low back pain (symptoms start earlier compared to R lateral flexion)  Right rotation WFL with slight R low back symptoms.  Left rotation WFL with slight R low back symptoms   (Blank rows = not tested)      TRANSFERS: Assistive device utilized: None  Sit to stand: Complete Independence Stand to sit: Complete Independence   FUNCTIONAL TESTS:  5 times sit to stand: 30.77 sec Timed up and go (TUG): 13.20 sec 6 minute walk test: 1072 ft (01/01/2023) 10 meter walk test: 11.66 sec  (+) repeated flexion test with reproduction of symptoms.  PATIENT SURVEYS:  FOTO 47/59  TODAY'S TREATMENT: DATE: 01/07/23   Therapeutic exercise  Seated R hip IR stretch with R foot on step with R lumbar flexion to stretch piriformis muscle on R 30 seconds x 3  Seated hip IR  R 10x3 with 5 second holds   Seated hip adduction isometrics small blue ball squeeze 10x5 seconds for 2 sets  Single leg dead lift with contralateral UE  assist  R 10x2  Supine R hip IR with PT 30 seconds x6  Supine with R hip in 90/90 position   R hip AAROM 10x3  Reclined   Hooklying hip extension with leg straight    R 10x5 seconds     Improved exercise technique, movement at target joints, use of target muscles after mod verbal, visual, tactile cues.     Decreased R low back pain reported after treatment         PATIENT EDUCATION: Education details: there-ex, HEP Person educated: Patient Education method: Explanation Education comprehension: verbalized understanding  HOME EXERCISE PROGRAM: Access Code: [email or web address] URL: [email or web address] Date: 01/01/2023 Prepared by: Loralyn Freshwater  Exercises - Supine Lower Trunk Rotation  - 1 x daily - 7 x weekly - 3 sets - 10 reps   - Seated Hip Internal Rotation AROM  - 1 x daily - 7 x weekly - 3 sets - 10 reps       GOALS: Goals reviewed with patient? Yes  SHORT TERM GOALS: Target date: 01/24/2023  Pt will be independent with HEP in order to demonstrate increased ability to perform tasks related to occupation/hobbies. Baseline:  To be given to pt at next appointment Goal status: INITIAL   LONG TERM GOALS: Target date: 03/21/2023  1.  Patient (> 66 years old) will complete five times sit to stand test in < 15 seconds indicating an increased LE strength and improved balance. Baseline: 30.77 sec without use of hands Goal status: INITIAL  2.  Patient will increase FOTO score to equal to or greater than 59 to demonstrate statistically significant improvement in mobility and quality of life.  Baseline: 12/27/22: 47 Goal status: INITIAL   3.  Patient will reduce timed up and go to <11 seconds to reduce fall risk and demonstrate improved transfer/gait ability. Baseline: 13.20 sec Goal status: INITIAL  4.  Patient will increase 10 meter walk test to >1.13m/s as to improve gait speed for better community ambulation and to reduce fall risk. Baseline: 11.66  sec/8.57 m/s Goal status: INITIAL  5.  Patient will increase six minute walk test distance to >1000 for progression to community ambulator and improve gait ability Baseline: to be performed at next visit; 1072 ft (01/01/2023) Goal status: INITIAL   ASSESSMENT:  CLINICAL IMPRESSION:  Continued working on improving R hip IR to improve ROM and decrease stress to his low back. Pt tolerated session well without aggravation of symptoms.  Pt will benefit from continued skilled physical therapy services to decrease pain, improve strength, balance, and function.      OBJECTIVE IMPAIRMENTS: Abnormal gait, decreased activity tolerance, decreased balance, decreased knowledge of use of DME, difficulty walking, decreased ROM, decreased strength, impaired sensation, and pain.   ACTIVITY LIMITATIONS: carrying, lifting, bending, sitting, standing, squatting, and locomotion level  PARTICIPATION LIMITATIONS: cleaning, laundry, shopping, community activity, and yard work  PERSONAL FACTORS: Age, Fitness, Past/current experiences, Time since onset of injury/illness/exacerbation, and 3+ comorbidities: arthritis, cancer, depression, abdominal aneurism  are also affecting patient's functional outcome.   REHAB POTENTIAL: Good  CLINICAL DECISION MAKING: Stable/uncomplicated  EVALUATION COMPLEXITY: Low  PLAN:  PT FREQUENCY: 1-2x/week  PT DURATION: 12 weeks  PLANNED INTERVENTIONS: Therapeutic exercises, Therapeutic activity, Neuromuscular re-education, Balance training, Gait training, Patient/Family education, Self Care, Joint mobilization, Joint manipulation, Stair training, Vestibular training, Canalith repositioning, DME instructions, Aquatic Therapy, Dry Needling, Spinal manipulation, Spinal mobilization, Cryotherapy, Moist heat, Taping, Ultrasound, Manual therapy, and Re-evaluation  PLAN FOR NEXT SESSION: complete , continue with assessment of lumbar region for pain complications that he is  experiencing.  Loralyn Freshwater PT, DPT Physical Therapist - [facility]  01/07/23, 12:38 PM

## 2023-01-09 ENCOUNTER — Ambulatory Visit
Admission: RE | Admit: 2023-01-09 | Discharge: 2023-01-09 | Disposition: A | Discharge status: Home / Self Care | Payer: Medicare Other | Source: Ambulatory Visit | Attending: Medical | Admitting: Medical

## 2023-01-09 ENCOUNTER — Other Ambulatory Visit: Payer: Medicare Other | Admitting: Pharmacist

## 2023-01-09 ENCOUNTER — Encounter: Payer: Medicare Other | Admitting: Pharmacist

## 2023-01-09 DIAGNOSIS — I700 Atherosclerosis of aorta: Secondary | ICD-10-CM | POA: Diagnosis not present

## 2023-01-09 DIAGNOSIS — I714 Abdominal aortic aneurysm, without rupture, unspecified: Secondary | ICD-10-CM | POA: Insufficient documentation

## 2023-01-09 MED ORDER — IOHEXOL 350 MG/ML SOLN
75.0000 mL | Freq: Once | INTRAVENOUS | Status: AC | PRN
  Administered 2023-01-09: 75 mL via INTRAVENOUS

## 2023-01-09 NOTE — Progress Notes (Signed)
01/09/2023 Name: Adam Juarez MRN: [id number] DOB: 01/18/1941  Chief Complaint  Patient presents with   Medication Management   Hypertension   Hyperlipidemia    Adam Juarez is a 82 y.o. year old male who presented for a telephone visit.   They were referred to the pharmacist by their PCP for assistance in managing medication review.    Subjective:  Care Team: Primary Care Provider: [name], NP ; Next Scheduled Visit: not scheduled  Medication Access/Adherence  Current Pharmacy:  [facility] - [address] [place] - [address] [place] [address] Phone: [phone] Fax: [phone]  [id number] - [place], [place] - [address] [place] [address] Phone: [phone] Fax: [phone]   Patient reports affordability concerns with their medications: No  Patient reports access/transportation concerns to their pharmacy: No  Patient reports adherence concerns with their medications:  No     Hyperlipidemia/PAD/ASCVD Risk Reduction  Current lipid lowering medications: atorvastatin 20 mg daily   Antiplatelet regimen: none due to DOAC   Atrial Fibrillation:  Current medications:  Rate Control: metoprolol succinate 50 mg QAM, 25 mg QPM Rhythm Control: none  Anticoagulation Regimen: Eliquis 5 mg twice daily    Current blood pressure/heart rate readings: is not checking  Patient denies hypotensive s/sx including dizziness, lightheadedness. Patient denies hypertensive symptoms including headache, chest pain, shortness of breath.   Does report periodic episodes of sweating when he is sitting on the couch, not exerting himself. Sweats normally when exerting, but this happens when not exerting himself. He does not notice an increased heart rate or feel palpitations when these episodes happen. Does not seem to coincide with "panic" episodes either.   GERD: - Current regimen:  omeprazole 20 mg twice daily   Asks if he should cut this back to once daily. Notes he has noticed a decreased appetite during supper, but then snacks before bed.   Cognitive Impairment: - Current regimen: memantine 5 mg twice daily   Reports he feels his memory continues to be a problem. Notes some episodes of panic/anxiety, has been working with neurology. Notes no benefit from escitalopram or bupropion. Is not interested in additional pharmacotherapy at this time. Notes episodes are not becoming any more frequent, but are becoming more intense.    Objective:  Lab Results  Component Value Date   HGBA1C 5.9 12/01/2020    Lab Results  Component Value Date   CREATININE 0.81 09/21/2022   BUN 20 09/21/2022   NA 139 09/21/2022   K 4.0 09/21/2022   CL 107 09/21/2022   CO2 23 09/21/2022    Lab Results  Component Value Date   CHOL 124 09/13/2021   HDL 32 (L) 09/13/2021   LDLCALC 40 09/13/2021   LDLDIRECT 41.0 07/22/2020   TRIG 262 (H) 09/13/2021   CHOLHDL 3.9 09/13/2021    Medications Reviewed Today     Reviewed by Alden Hipp, RPH-CPP (Pharmacist) on 01/09/23 at 0950  Med List Status: <None>   Medication Order Taking? Sig Documenting Provider Last Dose Status Informant  acetaminophen (TYLENOL) 500 MG tablet [id number]  Take 1,000 mg by mouth every 8 (eight) hours as needed for moderate pain. [provider]  Active Self  apixaban (ELIQUIS) 5 MG TABS tablet [id number] Yes TAKE 1 TABLET TWICE A DAY Gollan, Tollie Pizza, MD Taking Active   ascorbic acid (VITAMIN C) 500 MG tablet [id number] Yes Take 500 mg by mouth daily. [provider] Taking Active Self  atorvastatin (LIPITOR) 20 MG tablet [id number] Yes Take 1 tablet (20 mg total) by mouth daily. for cholesterol. [name], NP Taking Active   calcium carbonate (TUMS - DOSED IN MG ELEMENTAL CALCIUM) 500 MG chewable tablet [id number]  Chew 1 tablet by mouth. As needed [provider]  Active    CALCIUM-VITAMIN D PO [id number] Yes Take 600-800 mg by mouth daily. [provider] Taking Active   EPINEPHRINE 0.3 mg/0.3 mL IJ SOAJ injection [id number] No INJECT 0.3 ML (0.3 MG TOTAL) INTO THE MUSCLE AS NEEDED FOR ANAPHYLAXIS  Patient not taking: Reported on 01/09/2023   Gweneth Dimitri, MD Not Taking Active Self  Magnesium Oxide 500 MG TABS [id number] Yes Take 500 mg by mouth daily. [provider] Taking Active Self  Melatonin 1 MG CAPS [id number]  Take 1 mg by mouth at bedtime as needed (sleep). [provider]  Active Self  memantine (NAMENDA) 5 MG tablet [id number] Yes Take 5 mg by mouth 2 (two) times daily. [provider] Taking Active Self  metoprolol succinate (TOPROL-XL) 50 MG 24 hr tablet [id number] Yes Take 1 tablet (50 mg total) by mouth 2 (two) times daily. Antonieta Iba, MD Taking Active            Med Note Clearance Coots, Desma Paganini Jan 09, 2023  9:44 AM) 50 mg QAM, 25 mg QPM  Multiple Vitamin (MULTIVITAMIN) tablet [id number] Yes Take 1 tablet by mouth daily. [provider] Taking Active Self  Multiple Vitamins-Minerals (PRESERVISION AREDS 2 PO) [id number] Yes Take 1 capsule by mouth 2 (two) times daily.  [provider] Taking Active Self  omeprazole (PRILOSEC) 20 MG capsule [id number] Yes Take 1 capsule (20 mg total) by mouth 2 (two) times daily before a meal. For heartburn. [name], NP Taking Active   Polyethyl Glycol-Propyl Glycol (SYSTANE OP) [id number]  Apply 1 drop to eye 3 (three) times daily as needed (dry eyes).  Patient not taking: Reported on 12/17/2022   [provider]  Active Self  Potassium Gluconate 550 MG TABS [id number] Yes Take 550 mg by mouth daily. [provider] Taking Active Self  Probiotic Product (PROBIOTIC DAILY PO) [id number]  Take 1 capsule by mouth daily.  [provider]  Active Self  vitamin B-12 (CYANOCOBALAMIN) 1000 MCG tablet [id number] Yes Take 1,000 mcg by mouth  daily. [provider] Taking Active Self              Assessment/Plan:   Atrial Fibrillation: - Appropriately managed - Unclear if episodes of sweating while at rest are cardiac in nature, related to panic, or unrelated. Will notify cardiology.  - Encouraged to check blood pressure at home periodically, document, and provide at future appointments.   GERD: - Patient reports uncontrolled without omeprazole. Discussed that evidence is unclear surrounding long term impact of PPI, especially given patient age. Discussed that evening snacking could be contributing to symptoms. Patient verbalized understanding  Cognitive Impairment: - Uncontrolled per patient report. Discussed that no clear data exists for most over the counter "memory" aids. Discussed nonpharmacologic strategies. Encouraged to contact PCP or Dr. Sherryll Burger if worsening panic episodes or he would like to consider pharmacotherapy.   Follow Up Plan: follow up with PCP as scheduled  Catie Eppie Gibson, PharmD, BCACP, CPP Clinical Pharmacist [facility] [phone]

## 2023-01-09 NOTE — Patient Instructions (Signed)
Liamjames,   It was great talking to you today!  Check your blood pressure periodically, and any time you have concerning symptoms like headache, chest pain, dizziness, shortness of breath, or vision changes.   Our goal is less than 130/80.  To appropriately check your blood pressure, make sure you do the following:  1) Avoid caffeine, exercise, or tobacco products for 30 minutes before checking. Empty your bladder. 2) Sit with your back supported in a flat-backed chair. Rest your arm on something flat (arm of the chair, table, etc). 3) Sit still with your feet flat on the floor, resting, for at least 5 minutes.  4) Check your blood pressure. Take 1-2 readings.  5) Write down these readings and bring with you to any provider appointments.  Bring your home blood pressure machine with you to a provider's office for accuracy comparison at least once a year.   Make sure you take your blood pressure medications before you come to any office visit, even if you were asked to fast for labs.   Take care!  Catie Eppie Gibson, PharmD, BCACP, CPP Clinical Pharmacist [facility] [phone]

## 2023-01-10 ENCOUNTER — Telehealth: Payer: Self-pay

## 2023-01-10 NOTE — Telephone Encounter (Signed)
Per Cadence, pt need an appointment scheduled after ECHO.  Message sent to scheduling to arrange date and time.

## 2023-01-11 ENCOUNTER — Ambulatory Visit (INDEPENDENT_AMBULATORY_CARE_PROVIDER_SITE_OTHER): Payer: Medicare Other | Admitting: Vascular Surgery

## 2023-01-11 ENCOUNTER — Encounter (INDEPENDENT_AMBULATORY_CARE_PROVIDER_SITE_OTHER): Payer: Self-pay | Admitting: Vascular Surgery

## 2023-01-11 ENCOUNTER — Ambulatory Visit (INDEPENDENT_AMBULATORY_CARE_PROVIDER_SITE_OTHER): Payer: Medicare Other

## 2023-01-11 VITALS — BP 139/89 | HR 64 | Resp 18 | Ht 70.0 in | Wt 199.2 lb

## 2023-01-11 DIAGNOSIS — I723 Aneurysm of iliac artery: Secondary | ICD-10-CM | POA: Diagnosis not present

## 2023-01-11 DIAGNOSIS — R7303 Prediabetes: Secondary | ICD-10-CM | POA: Diagnosis not present

## 2023-01-11 DIAGNOSIS — I714 Abdominal aortic aneurysm, without rupture, unspecified: Secondary | ICD-10-CM | POA: Diagnosis not present

## 2023-01-11 DIAGNOSIS — I7143 Infrarenal abdominal aortic aneurysm, without rupture: Secondary | ICD-10-CM | POA: Diagnosis not present

## 2023-01-11 DIAGNOSIS — G912 (Idiopathic) normal pressure hydrocephalus: Secondary | ICD-10-CM | POA: Diagnosis not present

## 2023-01-11 NOTE — Assessment & Plan Note (Signed)
blood glucose control important in reducing the progression of atherosclerotic disease. Also, involved in wound healing. On appropriate medications.  

## 2023-01-11 NOTE — Progress Notes (Signed)
Patient ID: Adam Juarez, male   DOB: 05/24/1940, 82 y.o.   MRN: [id number]  Chief Complaint  Patient presents with   New Patient (Initial Visit)    Np consult with AAA Rapidly increasing aneurysm since May 2024    HPI Adam Juarez is a 82 y.o. male.  I am asked to see the patient by Vernona Rieger for evaluation of abdominal aortic aneurysm.  He has known about this aneurysm now for few years.  His father passed away from an aneurysm in his late 28s.  Looking back at his studies, his aneurysm has been in the 4.1 to 4.2 cm range on 2 studies over the past year.  He does not have any aneurysm related symptoms. Specifically, the patient denies new back or abdominal pain, or signs of peripheral embolization.  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 a-fib x 1 episode patient states he "auto corrected" seen at [facility]   GERD (gastroesophageal reflux disease)    Headache    poor posture - none recently   History of kidney stones    Hydrocephalus (HCC)    Hyperlipidemia    Neuropathy    PSVT (paroxysmal supraventricular tachycardia) 2009   Controlled with "breathing process"   Renal stone 9/08, 6/15   Wears dentures    full upper    Past Surgical History:  Procedure Laterality Date   CARDIAC CATHETERIZATION  2008   [place], [place]   CATARACT EXTRACTION [facility] Left 07/27/2015   Procedure: CATARACT EXTRACTION PHACO AND INTRAOCULAR LENS PLACEMENT (IOC);  Surgeon: Lockie Mola, MD;  Location: [facility];  Service: Ophthalmology;  Laterality: Left;  TORIC   CATARACT EXTRACTION W/PHACO Right 08/24/2015   Procedure: CATARACT EXTRACTION PHACO AND INTRAOCULAR LENS  PLACEMENT (IOC);  Surgeon: Lockie Mola, MD;  Location: [facility];  Service: Ophthalmology;  Laterality: Right;  TORIC   COLONOSCOPY WITH PROPOFOL N/A 08/10/2020   Procedure: COLONOSCOPY WITH PROPOFOL;  Surgeon: Toledo, Boykin Nearing, MD;  Location: [facility] ENDOSCOPY;  Service: Gastroenterology;  Laterality: N/A;   HEMORROIDECTOMY  2010   banding   IR ANGIO INTRA EXTRACRAN SEL COM CAROTID INNOMINATE BILAT MOD SED  10/30/2019   IR ANGIO VERTEBRAL SEL VERTEBRAL UNI R MOD SED  10/30/2019   IR RADIOLOGIST EVAL & MGMT  04/11/2020   IR US GUIDE VASC ACCESS RIGHT  10/30/2019   KIDNEY STONE SURGERY  9/08, 6/15   lithotripsy   LAPAROSCOPIC REVISION VENTRICULAR-PERITONEAL (V-P) SHUNT  01/08/2020   Procedure: LAPAROSCOPIC REVISION VENTRICULAR-PERITONEAL (V-P) SHUNT;  Surgeon: Lisbeth Renshaw, MD;  Location: [facility] OR;  Service: Neurosurgery;;   PROSTATE BIOPSY  2007   SKIN CANCER EXCISION     TOENAIL TRIMMING  11/2017   Dr. Orland Jarred   TONSILLECTOMY     VASECTOMY     VENTRICULOPERITONEAL SHUNT Right 01/08/2020   Procedure: SHUNT INSERTION VENTRICULAR-PERITONEAL;  Surgeon: Lisbeth Renshaw, MD;  Location: [facility] OR;  Service: Neurosurgery;  Laterality: Right;     Family History  Problem Relation Age of Onset   AAA (abdominal aortic aneurysm) Father    Parkinson's disease Mother    Arthritis Mother  Social History   Tobacco Use   Smoking status: Former    Current packs/day: 0.00    Average packs/day: 0.5 packs/day for 38.0 years (19.0 ttl pk-yrs)    Types: Cigarettes    Start date: 07/14/1959    Quit date: 07/13/1997    Years since quitting: 25.5   Smokeless tobacco: Never  Vaping Use   Vaping status: Never Used  Substance Use Topics   Alcohol use: No   Drug use: No     Allergies  Allergen Reactions   Bee Venom Anaphylaxis and Hives   Other Other (See Comments)    Pistachios - tickles throat  Pistachios - tickles throat   Tramadol Other (See Comments)    Current  Outpatient Medications  Medication Sig Dispense Refill   acetaminophen (TYLENOL) 500 MG tablet Take 1,000 mg by mouth every 8 (eight) hours as needed for moderate pain.     apixaban (ELIQUIS) 5 MG TABS tablet TAKE 1 TABLET TWICE A DAY 180 tablet 1   ascorbic acid (VITAMIN C) 500 MG tablet Take 500 mg by mouth daily.     atorvastatin (LIPITOR) 20 MG tablet Take 1 tablet (20 mg total) by mouth daily. for cholesterol. 90 tablet 3   CALCIUM-VITAMIN D PO Take 600-800 mg by mouth daily.     Magnesium Oxide 500 MG TABS Take 500 mg by mouth daily.     Melatonin 1 MG CAPS Take 1 mg by mouth at bedtime as needed (sleep).     memantine (NAMENDA) 5 MG tablet Take 5 mg by mouth 2 (two) times daily.     metoprolol succinate (TOPROL-XL) 50 MG 24 hr tablet Take 1 tablet (50 mg total) by mouth 2 (two) times daily. 180 tablet 3   Multiple Vitamin (MULTIVITAMIN) tablet Take 1 tablet by mouth daily.     Multiple Vitamins-Minerals (PRESERVISION AREDS 2 PO) Take 1 capsule by mouth 2 (two) times daily.      omeprazole (PRILOSEC) 20 MG capsule Take 1 capsule (20 mg total) by mouth 2 (two) times daily before a meal. For heartburn. 180 capsule 1   Potassium Gluconate 550 MG TABS Take 550 mg by mouth daily.     Probiotic Product (PROBIOTIC DAILY PO) Take 1 capsule by mouth daily.      vitamin B-12 (CYANOCOBALAMIN) 1000 MCG tablet Take 1,000 mcg by mouth daily.     EPINEPHRINE 0.3 mg/0.3 mL IJ SOAJ injection INJECT 0.3 ML (0.3 MG TOTAL) INTO THE MUSCLE AS NEEDED FOR ANAPHYLAXIS (Patient not taking: Reported on 01/09/2023) 2 each 1   Polyethyl Glycol-Propyl Glycol (SYSTANE OP) Apply 1 drop to eye 3 (three) times daily as needed (dry eyes). (Patient not taking: Reported on 12/17/2022)     No current facility-administered medications for this visit.      REVIEW OF SYSTEMS (Negative unless checked)  Constitutional: [] Weight loss  [] Fever  [] Chills Cardiac: [] Chest pain   [] Chest pressure   [] Palpitations   [] Shortness of  breath when laying flat   [] Shortness of breath at rest   [] Shortness of breath with exertion. Vascular:  [] Pain in legs with walking   [] Pain in legs at rest   [] Pain in legs when laying flat   [] Claudication   [] Pain in feet when walking  [] Pain in feet at rest  [] Pain in feet when laying flat   [] History of DVT   [] Phlebitis   [] Swelling in legs   [] Varicose veins   [] Non-healing ulcers Pulmonary:   [] Uses home oxygen   []   Productive cough   [] Hemoptysis   [] Wheeze  [] COPD   [] Asthma Neurologic:  [x] Dizziness  [] Blackouts   [] Seizures   [] History of stroke   [] History of TIA  [] Aphasia   [] Temporary blindness   [] Dysphagia   [] Weakness or numbness in arms   [] Weakness or numbness in legs Musculoskeletal:  [x] Arthritis   [] Joint swelling   [x] Joint pain   [] Low back pain Hematologic:  [] Easy bruising  [] Easy bleeding   [] Hypercoagulable state   [] Anemic  [] Hepatitis Gastrointestinal:  [] Blood in stool   [] Vomiting blood  [x] Gastroesophageal reflux/heartburn   [] Abdominal pain Genitourinary:  [] Chronic kidney disease   [] Difficult urination  [] Frequent urination  [] Burning with urination   [] Hematuria Skin:  [] Rashes   [] Ulcers   [] Wounds Psychological:  [] History of anxiety   []  History of major depression.    Physical Exam BP 139/89 (BP Location: Left Arm)   Pulse 64   Resp 18   Ht 5\' 10"  (1.778 m)   Wt 199 lb 3.2 oz (90.4 kg)   BMI 28.58 kg/m  Gen:  WD/WN, NAD. Appears younger than stated age. Head: [place]/AT, No temporalis wasting.  Ear/Nose/Throat: Hearing grossly intact, nares w/o erythema or drainage, oropharynx w/o Erythema/Exudate Eyes: Conjunctiva clear, sclera non-icteric  Neck: trachea midline.  No JVD.  Pulmonary:  Good air movement, respirations not labored, no use of accessory muscles  Cardiac: RRR, no JVD Vascular:  Vessel Right Left  Radial Palpable Palpable                                   Gastrointestinal:. No masses, surgical incisions, or  scars. Musculoskeletal: M/S 5/5 throughout.  Extremities without ischemic changes.  No deformity or atrophy. No edema. Neurologic: Sensation grossly intact in extremities.  Symmetrical.  Speech is fluent. Motor exam as listed above. Psychiatric: Judgment intact, Mood & affect appropriate for pt's clinical situation. Dermatologic: No rashes or ulcers noted.  No cellulitis or open wounds.    Radiology CT ANGIO CHEST AORTA W/CM & OR WO/CM  Result Date: 01/09/2023 CLINICAL DATA:  Follow-up ectatic aorta on prior echocardiogram, initial encounter EXAM: CT ANGIOGRAPHY CHEST WITH CONTRAST TECHNIQUE: Multidetector CT imaging of the chest was performed using the standard protocol during bolus administration of intravenous contrast. Multiplanar CT image reconstructions and MIPs were obtained to evaluate the vascular anatomy. RADIATION DOSE REDUCTION: This exam was performed according to the departmental dose-optimization program which includes automated exposure control, adjustment of the mA and/or kV according to patient size and/or use of iterative reconstruction technique. CONTRAST:  75mL OMNIPAQUE IOHEXOL 350 MG/ML SOLN COMPARISON:  By report from 2018 echocardiogram FINDINGS: Cardiovascular: Atherosclerotic calcifications of the thoracic aorta are noted. No findings to suggest dissection are seen. Mild dilatation of the ascending aorta 3.8 cm is noted. The pulmonary artery shows a normal branching pattern bilaterally. No intraluminal filling defect to suggest pulmonary embolism is seen. Heart is not significantly enlarged in size. Coronary calcifications are noted. Mediastinum/Nodes: Thoracic inlet is within normal limits. No hilar or mediastinal adenopathy is noted. The esophagus as visualized is within normal limits. Lungs/Pleura: Lungs are well aerated bilaterally. No focal infiltrate or effusion is seen. No sizable parenchymal nodule is noted. Upper Abdomen: Fusiform dilatation of the infrarenal aorta is  noted although incompletely evaluated on this exam. This was better visualized on prior CT from May of 2024. The remainder of the upper abdomen is within normal limits. Musculoskeletal: Acute  bony abnormality is noted. Review of the MIP images confirms the above findings. IMPRESSION: Ectasia of the ascending aorta to 3.8 cm. No further follow-up is recommended at this time. No evidence of pulmonary emboli. Abdominal aortic aneurysm seen better on prior CT from May of 2024. Follow-up as previously described. Aortic Atherosclerosis (ICD10-I70.0). Electronically Signed   By: Alcide Clever M.D.   On: 01/09/2023 22:12    Labs No results found for this or any previous visit (from the past 2160 hour(s)).  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  This is consistent with his CT scan from about 3 to 4 months ago showed a 4.2 cm aneurysm.  This is also consistent with his aneurysm from about a year ago at 4.1 cm. At this time, I would recommend twice yearly surveillance duplex ultrasound.  We discussed that if the aneurysm gets close to 5 cm, a CT scan of the abdomen and pelvis to be performed for further evaluation and surgical planning.  He recently had a CT scan of the chest showing a 3.8 cm ascending aorta which was ectatic but not aneurysmal.  This can be checked every few years with a CT scan.  Blood pressure control and avoidance of tobacco for maintenance.  We also discussed that his children should be checked for an aneurysm as his father had an aneurysm and there is a strong family history.  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  No worrisome symptoms currently.  Prediabetes blood glucose control important in reducing the progression of atherosclerotic disease. Also, involved in wound healing. On appropriate  medications.      Festus Barren 01/11/2023, 9:45 AM   This note was created with Dragon medical transcription system.  Any errors from dictation are unintentional.

## 2023-01-11 NOTE — Assessment & Plan Note (Signed)
VP shunt is in place.  No worrisome symptoms currently.

## 2023-01-11 NOTE — Patient Instructions (Signed)
Abdominal Aortic Aneurysm  An aneurysm is a bulge in an artery. An artery is a blood vessel that carries blood away from your heart. An abdominal aortic aneurysm happens in the main artery (aorta). Most aneurysms do not cause symptoms. But some can cause problems if they burst or tear. This can cause you to bleed inside your body. It is an emergency. It should be treated right away. What are the causes? The exact cause of an aneurysm is not known. What increases the risk? Being male and 60 years of age or older. Being Caucasian. Smoking. Being very overweight. Having: Family members who have had this condition. An injury to your main artery. Hardened arteries. Irritation and swelling of the walls of an artery. Certain genetic problems. An infection in the wall of the main artery. High cholesterol. High blood pressure. What are the signs or symptoms? You may not have any symptoms. If you do, you may have: Very bad pain in your side, back, or belly (abdomen). A feeling of fullness after you eat only a little bit of food. A lump in your belly that throbs. Problems with your feet or toes, like: Pain. Skin turning blue. Sores. Trouble pooping (constipation). Trouble peeing (urinating). If your aneurysm bursts, you may: Feel sudden, very bad pain in your belly, side, or back. Vomit or feel like you may vomit. Feel light-headed. Faint. How is this treated? Treatment depends on: Your age. How big your aneurysm is. How fast it is growing. How likely it is to burst. If the aneurysm is smaller than 2 inches (5 cm), your doctor may: Keep an eye on it to see if it gets bigger. You may need to have testing done every 6-12 months, every year, or every few years. Give you medicines for: High blood pressure. Pain. Infection. If the aneurysm is bigger than 2 inches (5 cm), you may need surgery. Follow these instructions at home: Eating and drinking  Eat foods that are good for your  heart. Eat a lot of: Fresh fruits and vegetables. Whole grains. Low-fat (lean) protein. Low-fat dairy products. Stay away from foods that are high in saturated fat and cholesterol. These include red meat and some dairy products. Lifestyle  Do not smoke or use any products that contain nicotine or tobacco. If you need help quitting, ask your doctor. Check your blood pressure often. Follow instructions from your doctor on how to keep it at a normal level. Have your cholesterol levels checked often. Follow instructions from your doctor on how to keep it at the right levels. Stay active. Get exercise. Ask your doctor how often to exercise. Ask what types of exercise are safe for you. Stay at a healthy weight. Alcohol use Do not drink alcohol if: Your doctor tells you not to drink. You are pregnant, may be pregnant, or plan to become pregnant. If you drink alcohol: Limit how much you have to: 0-1 drink a day if you are male. 0-2 drinks a day if you are male. Know how much alcohol is in your drink. In the [place] one drink is one 12 oz bottle of beer (355 mL), one 5 oz glass of wine (148 mL), or one 1 oz glass of hard liquor (44 mL). General instructions Take over-the-counter and prescription medicines only as told by your doctor. You may have to avoid lifting. Ask your doctor how much you can safely lift. If you can, learn your family's health history. Keep all follow-up visits. Your doctor will need   to keep an eye on your aneurysm and check how fast it is growing. Where to find more information American Heart Association: heart.org Contact a doctor if: Your belly, side, or back hurts. Your belly throbs. Your heart beats fast when you stand. You feel like you may vomit, or you vomit. You have trouble pooping. You have trouble peeing. You have a fever. Get help right away if: You have sudden, bad pain in your belly, side, or back. You feel light-headed, or you faint. You have  sweaty skin that is cold to the touch (clammy). You are short of breath. These symptoms may be an emergency. Get help right away. Call 911. Do not wait to see if the symptoms will go away. Do not drive yourself to the hospital. This information is not intended to replace advice given to you by your health care provider. Make sure you discuss any questions you have with your health care provider. Document Revised: 12/05/2021 Document Reviewed: 12/05/2021 Elsevier Patient Education  2024 [employer].

## 2023-01-11 NOTE — Assessment & Plan Note (Signed)
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  This is consistent with his CT scan from about 3 to 4 months ago showed a 4.2 cm aneurysm.  This is also consistent with his aneurysm from about a year ago at 4.1 cm. At this time, I would recommend twice yearly surveillance duplex ultrasound.  We discussed that if the aneurysm gets close to 5 cm, a CT scan of the abdomen and pelvis to be performed for further evaluation and surgical planning.  He recently had a CT scan of the chest showing a 3.8 cm ascending aorta which was ectatic but not aneurysmal.  This can be checked every few years with a CT scan.  Blood pressure control and avoidance of tobacco for maintenance.  We also discussed that his children should be checked for an aneurysm as his father had an aneurysm and there is a strong family history.

## 2023-01-15 ENCOUNTER — Other Ambulatory Visit: Payer: Medicare Other

## 2023-01-16 ENCOUNTER — Encounter: Payer: Self-pay | Admitting: Cardiovascular Disease

## 2023-01-16 ENCOUNTER — Ambulatory Visit: Payer: Medicare Other

## 2023-01-16 DIAGNOSIS — M216X1 Other acquired deformities of right foot: Secondary | ICD-10-CM | POA: Diagnosis not present

## 2023-01-16 DIAGNOSIS — M545 Low back pain, unspecified: Secondary | ICD-10-CM | POA: Diagnosis not present

## 2023-01-16 DIAGNOSIS — M2041 Other hammer toe(s) (acquired), right foot: Secondary | ICD-10-CM | POA: Diagnosis not present

## 2023-01-16 DIAGNOSIS — B351 Tinea unguium: Secondary | ICD-10-CM | POA: Diagnosis not present

## 2023-01-16 DIAGNOSIS — M216X9 Other acquired deformities of unspecified foot: Secondary | ICD-10-CM | POA: Diagnosis not present

## 2023-01-16 DIAGNOSIS — M79675 Pain in left toe(s): Secondary | ICD-10-CM | POA: Diagnosis not present

## 2023-01-16 DIAGNOSIS — L909 Atrophic disorder of skin, unspecified: Secondary | ICD-10-CM | POA: Diagnosis not present

## 2023-01-16 DIAGNOSIS — M79674 Pain in right toe(s): Secondary | ICD-10-CM | POA: Diagnosis not present

## 2023-01-16 DIAGNOSIS — M2042 Other hammer toe(s) (acquired), left foot: Secondary | ICD-10-CM | POA: Diagnosis not present

## 2023-01-16 DIAGNOSIS — M792 Neuralgia and neuritis, unspecified: Secondary | ICD-10-CM | POA: Diagnosis not present

## 2023-01-16 DIAGNOSIS — L851 Acquired keratosis [keratoderma] palmaris et plantaris: Secondary | ICD-10-CM | POA: Diagnosis not present

## 2023-01-16 DIAGNOSIS — M216X2 Other acquired deformities of left foot: Secondary | ICD-10-CM | POA: Diagnosis not present

## 2023-01-18 ENCOUNTER — Ambulatory Visit: Payer: Medicare Other | Attending: Medical

## 2023-01-18 DIAGNOSIS — R011 Cardiac murmur, unspecified: Secondary | ICD-10-CM | POA: Insufficient documentation

## 2023-01-18 LAB — ECHOCARDIOGRAM COMPLETE
AR max vel: 1.56 cm2
AV Area VTI: 1.52 cm2
AV Area mean vel: 1.45 cm2
AV Mean grad: 9 mmHg
AV Peak grad: 16.2 mmHg
Ao pk vel: 2.01 m/s
Area-P 1/2: 2.66 cm2
S' Lateral: 3.7 cm

## 2023-01-22 ENCOUNTER — Ambulatory Visit: Payer: Medicare Other | Attending: Primary Care

## 2023-01-22 DIAGNOSIS — M5441 Lumbago with sciatica, right side: Secondary | ICD-10-CM | POA: Diagnosis not present

## 2023-01-22 DIAGNOSIS — R2689 Other abnormalities of gait and mobility: Secondary | ICD-10-CM | POA: Diagnosis not present

## 2023-01-22 DIAGNOSIS — M5459 Other low back pain: Secondary | ICD-10-CM | POA: Insufficient documentation

## 2023-01-22 DIAGNOSIS — G8929 Other chronic pain: Secondary | ICD-10-CM | POA: Diagnosis not present

## 2023-01-22 NOTE — Therapy (Signed)
OUTPATIENT PHYSICAL THERAPY Treatment   Patient Name: Adam Juarez MRN: [id number] DOB:[birth year], 82 y.o., male Today's Date: 01/22/2023   PCP: [name], NP  REFERRING PROVIDER: [name], NP  END OF SESSION:  PT End of Session - 01/22/23 1433     Visit Number 5    Number of Visits 24    Date for PT Re-Evaluation 03/21/23    PT Start Time 1433    PT Stop Time 1512    PT Time Calculation (min) 39 min    Activity Tolerance Patient tolerated treatment well    Behavior During Therapy [facility] 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 a-fib x 1 episode patient states he "auto corrected" seen at [facility]   GERD (gastroesophageal reflux disease)    Headache    poor posture - none recently   History of kidney stones    Hydrocephalus (HCC)    Hyperlipidemia    Neuropathy    PSVT (paroxysmal supraventricular tachycardia) 2009   Controlled with "breathing process"   Renal stone 9/08, 6/15   Wears dentures    full upper   Past Surgical History:  Procedure Laterality Date   CARDIAC CATHETERIZATION  2008   [place], [place]   CATARACT EXTRACTION [facility] Left 07/27/2015   Procedure: CATARACT EXTRACTION PHACO AND INTRAOCULAR LENS PLACEMENT (IOC);  Surgeon: Lockie Mola, MD;  Location: [facility];  Service: Ophthalmology;  Laterality: Left;  TORIC   CATARACT EXTRACTION W/PHACO Right 08/24/2015   Procedure: CATARACT EXTRACTION PHACO AND INTRAOCULAR LENS PLACEMENT (IOC);  Surgeon: Lockie Mola, MD;  Location: [facility];  Service: Ophthalmology;  Laterality: Right;  TORIC   COLONOSCOPY WITH PROPOFOL N/A 08/10/2020   Procedure: COLONOSCOPY WITH PROPOFOL;  Surgeon: Toledo, Boykin Nearing, MD;  Location: [facility];  Service: Gastroenterology;  Laterality:  N/A;   HEMORROIDECTOMY  2010   banding   IR ANGIO INTRA EXTRACRAN SEL COM CAROTID INNOMINATE BILAT MOD SED  10/30/2019   IR ANGIO VERTEBRAL SEL VERTEBRAL UNI R MOD SED  10/30/2019   IR RADIOLOGIST EVAL & MGMT  04/11/2020   IR US GUIDE VASC ACCESS RIGHT  10/30/2019   KIDNEY STONE SURGERY  9/08, 6/15   lithotripsy   LAPAROSCOPIC REVISION VENTRICULAR-PERITONEAL (V-P) SHUNT  01/08/2020   Procedure: LAPAROSCOPIC REVISION VENTRICULAR-PERITONEAL (V-P) SHUNT;  Surgeon: Lisbeth Renshaw, MD;  Location: [facility];  Service: Neurosurgery;;   PROSTATE BIOPSY  2007   SKIN CANCER EXCISION     TOENAIL TRIMMING  11/2017   Dr. Orland Jarred   TONSILLECTOMY     VASECTOMY     VENTRICULOPERITONEAL SHUNT Right 01/08/2020   Procedure: SHUNT INSERTION VENTRICULAR-PERITONEAL;  Surgeon: Lisbeth Renshaw, MD;  Location: [facility];  Service: Neurosurgery;  Laterality: Right;   Patient Active Problem List   Diagnosis Date Noted   Imbalance 12/07/2022   Paronychia of right index finger 07/16/2022   Foul smelling urine 06/22/2022   Right lower quadrant abdominal pain 06/22/2022   Sensorineural hearing loss, bilateral 05/25/2022   Gastroesophageal reflux disease 05/25/2022   Other specified counseling 05/25/2022   (Idiopathic) normal pressure hydrocephalus (HCC) 05/25/2022   Paroxysmal tachycardia (HCC) 05/25/2022   Elevated bilirubin 08/04/2021   History of  macular degeneration 06/22/2021   Dizziness 12/01/2020   Chronic abdominal pain 11/10/2020   Proctalgia 10/27/2020   Hemorrhoids with complication 10/27/2020   Hemorrhoids 07/21/2020   Colon cancer screening 07/14/2020   Chronic idiopathic constipation 04/21/2020   Prolapsed internal hemorrhoids, grade 3 04/21/2020   VP (ventriculoperitoneal) shunt status 04/21/2020   Bilateral lower abdominal pain 04/21/2020   Abdominal pain, generalized 02/09/2020   Normal pressure hydrocephalus (HCC) 01/08/2020   Excessive daytime sleepiness 10/19/2019   Hip pain 07/15/2019    Medicare annual wellness visit, subsequent 02/06/2019   Memory changes 11/21/2018   Aortic atherosclerosis (HCC) 03/27/2018   PAD (peripheral artery disease) (HCC) 03/27/2018   Smoking history 03/27/2018   Chronic left shoulder pain 03/13/2018   History of systemic reaction to bee sting 01/28/2018   Prediabetes 01/28/2018   History of elevated PSA 01/28/2018   AAA (abdominal aortic aneurysm) (HCC) 05/22/2017   Osteoarthritis 07/13/2016   Chronic foot pain 07/13/2016   Encounter for abdominal aortic aneurysm (AAA) screening 02/17/2016   Mixed hyperlipidemia 11/27/2015   Chronic back pain 11/27/2015   Chronic fatigue 11/27/2015   SVT (supraventricular tachycardia) 09/16/2015   Paroxysmal atrial fibrillation (HCC) 09/16/2015   Frequent PVCs 09/16/2015   Esophageal reflux 09/16/2015   Preventative health care 09/16/2015   Encounter for general adult medical examination without abnormal findings 09/16/2015   Hypertriglyceridemia 04/21/2015    ONSET DATE: Balance: 3-4 months; Back pain: years  REFERRING DIAG:  M54.50,G89.29 (ICD-10-CM) - Chronic right-sided low back pain without sciatica R26.89 (ICD-10-CM) - Imbalance    THERAPY DIAG:  Imbalance  Other low back pain  Chronic midline low back pain with right-sided sciatica  Rationale for Evaluation and Treatment: Rehabilitation  SUBJECTIVE:                                                                                                                                                                                             SUBJECTIVE STATEMENT: R low back is not so good. Felt sick last week., feeling better from the sickness. 6/10 R low back pain currently in sitting and walking.    Pt accompanied by: self  PERTINENT HISTORY: Pt notes that he has been recently having some increased difficulty with balance over the past 3-4 months.  Pt has been utilizing a cane for the past 2-3 weeks.  Pt notes that he was not using the  cane for balance, but due to the pain he was experiencing in the back that shoots into the LE.  Pt states he has progressed to having to use the cane to catch himself when the pain hits.  Pt  has neuropathy in his feet, has been taking medication for it and he believe it is starting to help.    PAIN:  Are you having pain? Yes: NPRS scale: 1.38/10 Pain location: low back Pain description: dull, radiating to the L LE Aggravating factors: Getting up from seated position Relieving factors: Standing/walking Best: 0/10 Worst: 9/10, but does not last long  PRECAUTIONS: Other: shunt in the cranial region, for pressure relief abdominal aortic aneurysm     RED FLAGS: Bowel [place] bladder incontinence: Yes: pt reports due to old age. and Abdominal aneurysm: Yes: first discovered when he retired from the [employer] BEARING RESTRICTIONS: No  FALLS: Has patient fallen in last 6 months? Yes. Number of falls 1  LIVING ENVIRONMENT: Lives with: lives with their spouse Lives in: House/apartment Stairs: No Has following equipment at home: Single point cane, shower chair, and Grab bars  PLOF: Independent  PATIENT GOALS:  Improve the back pain so it does not immobilize him.     OBJECTIVE:   DIAGNOSTIC FINDINGS:   EXAM: LUMBAR SPINE - COMPLETE 4+ VIEW  IMPRESSION: 1. Multilevel degenerative changes of the lumbar spine, most pronounced at L3-L4. 2. Fusiform infrarenal abdominal aortic aneurysm measuring up to approximately 5.0 cm. Recommend follow-up CT/MR every 6 months and vascular consultation. This recommendation follows ACR consensus guidelines: White Paper of the ACR Incidental Findings Committee II on Vascular Findings. J Am Coll Radiol 2013; 40:981-191. 3.  Aortic Atherosclerosis (ICD10-I70.0).   COGNITION: Overall cognitive status: Within functional limits for tasks assessed  SENSATION: Light touch: Impaired   COORDINATION: Pt has good coordination  POSTURE: rounded  shoulders, forward head, and decreased lumbar lordosis  LOWER EXTREMITY ROM:     PROM Right 01/01/2023 Left 01/01/2023  Hip flexion    Hip extension    Hip abduction    Hip adduction    Hip internal rotation 27 41  Hip external rotation    Knee flexion    Knee extension    Ankle dorsiflexion    Ankle plantarflexion    Ankle inversion    Ankle eversion     (Blank rows = not tested)  LOWER EXTREMITY MMT:    MMT Right Eval Left Eval  Hip flexion 4- 5  Hip extension 4- 4-  Hip abduction 3+ 3+  Hip adduction 3+ 3+  Hip internal rotation    Hip external rotation    Knee flexion 4- 4+  Knee extension 4- 4+  Ankle dorsiflexion 4+ 5  Ankle plantarflexion    Ankle inversion    Ankle eversion    (Blank rows = not tested)   LUMBAR ROM:   Active  AROM  01/01/2023  Flexion Limited with R low back pain  Extension Limited with reproduction Adam R low back pain   Right lateral flexion Limited with R low back pain  Left lateral flexion Limited with R low back pain (symptoms start earlier compared to R lateral flexion)  Right rotation WFL with slight R low back symptoms.  Left rotation WFL with slight R low back symptoms   (Blank rows = not tested)      TRANSFERS: Assistive device utilized: None  Sit to stand: Complete Independence Stand to sit: Complete Independence   FUNCTIONAL TESTS:  5 times sit to stand: 30.77 sec Timed up and go (TUG): 13.20 sec 6 minute walk test: 1072 ft (01/01/2023) 10 meter walk test: 11.66 sec  (+) repeated flexion test with reproduction of symptoms.  PATIENT SURVEYS:  FOTO 47/59  TODAY'S TREATMENT: DATE: 01/22/23   Therapeutic exercise  Seated B scapular retraction to promote thoracic extension to decrease lumbar extension stress 10x5 seconds for 2 sets  Static standing mini lunge  With one UE assist    R 4x   R low back discomfort  Standing bent over hip extension over table   R 10x5 seconds  Static standing single leg  dead lift with contralateral UE assist   R 10x3  L 10x3  Seated hip IR  R 10x2 with 5 second holds   Seated hip adduction isometrics small blue ball squeeze 10x5 seconds for 2 sets    Improved exercise technique, movement at target joints, use of target muscles after mod verbal, visual, tactile cues.     Decreased R low back pain reported after treatment         PATIENT EDUCATION: Education details: there-ex, HEP Person educated: Patient Education method: Explanation Education comprehension: verbalized understanding  HOME EXERCISE PROGRAM: Access Code: [email or web address] URL: [email or web address] Date: 01/01/2023 Prepared by: Loralyn Freshwater  Exercises - Supine Lower Trunk Rotation  - 1 x daily - 7 x weekly - 3 sets - 10 reps   - Seated Hip Internal Rotation AROM  - 1 x daily - 7 x weekly - 3 sets - 10 reps   - Forward T with Counter Support  - 1 x daily - 7 x weekly - 3 sets - 10 reps      GOALS: Goals reviewed with patient? Yes  SHORT TERM GOALS: Target date: 01/24/2023  Pt will be independent with HEP in order to demonstrate increased ability to perform tasks related to occupation/hobbies. Baseline:  To be given to pt at next appointment Goal status: INITIAL   LONG TERM GOALS: Target date: 03/21/2023  1.  Patient (> 78 years old) will complete five times sit to stand test in < 15 seconds indicating an increased LE strength and improved balance. Baseline: 30.77 sec without use of hands Goal status: INITIAL  2.  Patient will increase FOTO score to equal to Adam greater than 59 to demonstrate statistically significant improvement in mobility and quality of life.  Baseline: 12/27/22: 47 Goal status: INITIAL   3.  Patient will reduce timed up and go to <11 seconds to reduce fall risk and demonstrate improved transfer/gait ability. Baseline: 13.20 sec Goal status: INITIAL  4.  Patient will increase 10 meter walk test to >1.109m/s as to improve gait  speed for better community ambulation and to reduce fall risk. Baseline: 11.66 sec/8.57 m/s Goal status: INITIAL  5.  Patient will increase six minute walk test distance to >1000 for progression to community ambulator and improve gait ability Baseline: to be performed at next visit; 1072 ft (01/01/2023) Goal status: INITIAL   ASSESSMENT:  CLINICAL IMPRESSION:  Worked on thoracic extension, glute strength to decrease stress to low back. Decreased low back pain in sitting and during gait reported after session.  Pt will benefit from continued skilled physical therapy services to decrease pain, improve strength, balance, and function.      OBJECTIVE IMPAIRMENTS: Abnormal gait, decreased activity tolerance, decreased balance, decreased knowledge of use of DME, difficulty walking, decreased ROM, decreased strength, impaired sensation, and pain.   ACTIVITY LIMITATIONS: carrying, lifting, bending, sitting, standing, squatting, and locomotion level  PARTICIPATION LIMITATIONS: cleaning, laundry, shopping, community activity, and yard work  PERSONAL FACTORS: Age, Fitness, Past/current experiences, Time since onset of injury/illness/exacerbation, and 3+ comorbidities: arthritis, cancer, depression, abdominal  aneurism  are also affecting patient's functional outcome.   REHAB POTENTIAL: Good  CLINICAL DECISION MAKING: Stable/uncomplicated  EVALUATION COMPLEXITY: Low  PLAN:  PT FREQUENCY: 1-2x/week  PT DURATION: 12 weeks  PLANNED INTERVENTIONS: Therapeutic exercises, Therapeutic activity, Neuromuscular re-education, Balance training, Gait training, Patient/Family education, Self Care, Joint mobilization, Joint manipulation, Stair training, Vestibular training, Canalith repositioning, DME instructions, Aquatic Therapy, Dry Needling, Spinal manipulation, Spinal mobilization, Cryotherapy, Moist heat, Taping, Ultrasound, Manual therapy, and Re-evaluation  PLAN FOR NEXT SESSION: complete ,  continue with assessment of lumbar region for pain complications that he is experiencing.  Loralyn Freshwater PT, DPT Physical Therapist - [facility]  01/22/23, 3:20 PM

## 2023-01-23 ENCOUNTER — Ambulatory Visit: Payer: Self-pay

## 2023-01-23 NOTE — Patient Instructions (Signed)
Visit Information  Thank you for taking time to visit with me today. Please don't hesitate to contact me if I can be of assistance to you.   Following are the goals we discussed today:   Goals Addressed             This Visit's Progress    COMPLETED: Patient stated:  Management of Afib and chronic health conditions.       Interventions Today    Flowsheet Row Most Recent Value  Chronic Disease   Chronic disease during today's visit Atrial Fibrillation (AFib), Other  [abdominal aortic aneurysm]  General Interventions   General Interventions Discussed/Reviewed General Interventions Reviewed, Doctor Visits  [evaluation of current treatment plan related to atrial fibrillation, and abdominal aneurysm and patients adherence to plan as established by provider]  Doctor Visits Discussed/Reviewed Doctor Visits Reviewed  [name] upcoming provider visits.  Advised to keep follow up visits with provider as recommended. Advised to call provider or RN case manager if care coodination services in the future]  Education Interventions   Education Provided Provided Education  [Reviewed signs of atrial fibrillation. Advised to notify provider for mild/ moderate symptoms and call 911 for severe symptoms.]  Pharmacy Interventions   Pharmacy Dicussed/Reviewed Pharmacy Topics Reviewed  [Reviewed medications and discussed compliance. Advised to let provider know of signs of bleeding due to taking Eliquis.]                 Please contact your primary care provider if care coordination services needed in the future.  If you are experiencing a Mental Health or Behavioral Health Crisis or need someone to talk to, please call the Suicide and Crisis Lifeline: 988 call 1-800-273-TALK (toll free, 24 hour hotline)  Patient verbalizes understanding of instructions and care plan provided today and agrees to view in MyChart. Active MyChart status and patient understanding of how to access instructions and care  plan via MyChart confirmed with patient.     [name] RN,BSN,CCM [facility] Care Coordination [phone] direct line

## 2023-01-23 NOTE — Patient Outreach (Signed)
  Care Coordination   Follow Up Visit Note   01/23/2023 Name: Adam Juarez MRN: [id number] DOB: [birth year]  Adam Juarez is a 82 y.o. year old male who sees [name], NP for primary care. I spoke with  [name] by phone today.  What matters to the patients health and wellness today?  Patient states he is doing well. Denies having atrial fibrillation symptoms. He states he is aware of atrial fibrillation symptoms and when to notify his doctor for concerns.  Patient reports having follow up visit with vascular surgeon.  He states vascular surgeon will monitor his aneurysm for no. No treatment/ surgery needed.     Goals Addressed             This Visit's Progress    COMPLETED: Patient stated:  Management of Afib and chronic health conditions.       Interventions Today    Flowsheet Row Most Recent Value  Chronic Disease   Chronic disease during today's visit Atrial Fibrillation (AFib), Other  [abdominal aortic aneurysm]  General Interventions   General Interventions Discussed/Reviewed General Interventions Reviewed, Doctor Visits  [evaluation of current treatment plan related to atrial fibrillation, and abdominal aneurysm and patients adherence to plan as established by provider]  Doctor Visits Discussed/Reviewed Doctor Visits Reviewed  [name] upcoming provider visits.  Advised to keep follow up visits with provider as recommended. Advised to call provider or RN case manager if care coodination services in the future]  Education Interventions   Education Provided Provided Education  [Reviewed signs of atrial fibrillation. Advised to notify provider for mild/ moderate symptoms and call 911 for severe symptoms.]  Pharmacy Interventions   Pharmacy Dicussed/Reviewed Pharmacy Topics Reviewed  [Reviewed medications and discussed compliance. Advised to let provider know of signs of bleeding due to taking Eliquis.]                 SDOH assessments and interventions  completed:  No     Care Coordination Interventions:  Yes, provided   Follow up plan: No further intervention required.   Encounter Outcome:  Patient Visit Completed   [name] [facility] Care Coordination [phone] direct line

## 2023-01-24 ENCOUNTER — Ambulatory Visit: Payer: Medicare Other

## 2023-01-24 DIAGNOSIS — G8929 Other chronic pain: Secondary | ICD-10-CM | POA: Diagnosis not present

## 2023-01-24 DIAGNOSIS — M5459 Other low back pain: Secondary | ICD-10-CM | POA: Diagnosis not present

## 2023-01-24 DIAGNOSIS — M5441 Lumbago with sciatica, right side: Secondary | ICD-10-CM | POA: Diagnosis not present

## 2023-01-24 DIAGNOSIS — R2689 Other abnormalities of gait and mobility: Secondary | ICD-10-CM | POA: Diagnosis not present

## 2023-01-24 NOTE — Therapy (Signed)
OUTPATIENT PHYSICAL THERAPY Treatment   Patient Name: Adam Juarez MRN: [id number] DOB:1941-02-13, 82 y.o., male Today's Date: 01/24/2023   PCP: [name], NP  REFERRING PROVIDER: [name], NP  END OF SESSION:  PT End of Session - 01/24/23 1033     Visit Number 6    Number of Visits 24    Date for PT Re-Evaluation 03/21/23    PT Start Time 1033    PT Stop Time 1113    PT Time Calculation (min) 40 min    Activity Tolerance Patient tolerated treatment well    Behavior During Therapy [facility] for tasks assessed/performed                  Past Medical History:  Diagnosis Date   Arthritis    fingers   Cancer (HCC) 1991, 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 a-fib x 1 episode patient states he "auto corrected" seen at [facility]   GERD (gastroesophageal reflux disease)    Headache    poor posture - none recently   History of kidney stones    Hydrocephalus (HCC)    Hyperlipidemia    Neuropathy    PSVT (paroxysmal supraventricular tachycardia) 2009   Controlled with "breathing process"   Renal stone 9/08, 6/15   Wears dentures    full upper   Past Surgical History:  Procedure Laterality Date   CARDIAC CATHETERIZATION  2008   [place], [place]   CATARACT EXTRACTION [facility] Left 07/27/2015   Procedure: CATARACT EXTRACTION PHACO AND INTRAOCULAR LENS PLACEMENT (IOC);  Surgeon: Lockie Mola, MD;  Location: [facility];  Service: Ophthalmology;  Laterality: Left;  TORIC   CATARACT EXTRACTION W/PHACO Right 08/24/2015   Procedure: CATARACT EXTRACTION PHACO AND INTRAOCULAR LENS PLACEMENT (IOC);  Surgeon: Lockie Mola, MD;  Location: [facility];  Service: Ophthalmology;  Laterality: Right;  TORIC   COLONOSCOPY WITH PROPOFOL N/A 08/10/2020   Procedure: COLONOSCOPY WITH PROPOFOL;  Surgeon: Toledo, Boykin Nearing, MD;  Location: [facility] ENDOSCOPY;  Service: Gastroenterology;   Laterality: N/A;   HEMORROIDECTOMY  2010   banding   IR ANGIO INTRA EXTRACRAN SEL COM CAROTID INNOMINATE BILAT MOD SED  10/30/2019   IR ANGIO VERTEBRAL SEL VERTEBRAL UNI R MOD SED  10/30/2019   IR RADIOLOGIST EVAL & MGMT  04/11/2020   IR US GUIDE VASC ACCESS RIGHT  10/30/2019   KIDNEY STONE SURGERY  9/08, 6/15   lithotripsy   LAPAROSCOPIC REVISION VENTRICULAR-PERITONEAL (V-P) SHUNT  01/08/2020   Procedure: LAPAROSCOPIC REVISION VENTRICULAR-PERITONEAL (V-P) SHUNT;  Surgeon: Lisbeth Renshaw, MD;  Location: [facility] OR;  Service: Neurosurgery;;   PROSTATE BIOPSY  2007   SKIN CANCER EXCISION     TOENAIL TRIMMING  11/2017   Dr. Orland Jarred   TONSILLECTOMY     VASECTOMY     VENTRICULOPERITONEAL SHUNT Right 01/08/2020   Procedure: SHUNT INSERTION VENTRICULAR-PERITONEAL;  Surgeon: Lisbeth Renshaw, MD;  Location: [facility] OR;  Service: Neurosurgery;  Laterality: Right;   Patient Active Problem List   Diagnosis Date Noted   Imbalance 12/07/2022   Paronychia of right index finger 07/16/2022   Foul smelling urine 06/22/2022   Right lower quadrant abdominal pain 06/22/2022   Sensorineural hearing loss, bilateral 05/25/2022   Gastroesophageal reflux disease 05/25/2022   Other specified counseling 05/25/2022   (Idiopathic) normal pressure hydrocephalus (HCC) 05/25/2022   Paroxysmal tachycardia (HCC) 05/25/2022   Elevated bilirubin 08/04/2021   History  of macular degeneration 06/22/2021   Dizziness 12/01/2020   Chronic abdominal pain 11/10/2020   Proctalgia 10/27/2020   Hemorrhoids with complication 10/27/2020   Hemorrhoids 07/21/2020   Colon cancer screening 07/14/2020   Chronic idiopathic constipation 04/21/2020   Prolapsed internal hemorrhoids, grade 3 04/21/2020   VP (ventriculoperitoneal) shunt status 04/21/2020   Bilateral lower abdominal pain 04/21/2020   Abdominal pain, generalized 02/09/2020   Normal pressure hydrocephalus (HCC) 01/08/2020   Excessive daytime sleepiness 10/19/2019   Hip pain  07/15/2019   Medicare annual wellness visit, subsequent 02/06/2019   Memory changes 11/21/2018   Aortic atherosclerosis (HCC) 03/27/2018   PAD (peripheral artery disease) (HCC) 03/27/2018   Smoking history 03/27/2018   Chronic left shoulder pain 03/13/2018   History of systemic reaction to bee sting 01/28/2018   Prediabetes 01/28/2018   History of elevated PSA 01/28/2018   AAA (abdominal aortic aneurysm) (HCC) 05/22/2017   Osteoarthritis 07/13/2016   Chronic foot pain 07/13/2016   Encounter for abdominal aortic aneurysm (AAA) screening 02/17/2016   Mixed hyperlipidemia 11/27/2015   Chronic back pain 11/27/2015   Chronic fatigue 11/27/2015   SVT (supraventricular tachycardia) 09/16/2015   Paroxysmal atrial fibrillation (HCC) 09/16/2015   Frequent PVCs 09/16/2015   Esophageal reflux 09/16/2015   Preventative health care 09/16/2015   Encounter for general adult medical examination without abnormal findings 09/16/2015   Hypertriglyceridemia 04/21/2015    ONSET DATE: Balance: 3-4 months; Back pain: years  REFERRING DIAG:  M54.50,G89.29 (ICD-10-CM) - Chronic right-sided low back pain without sciatica R26.89 (ICD-10-CM) - Imbalance    THERAPY DIAG:  Imbalance  Other low back pain  Chronic midline low back pain with right-sided sciatica  Rationale for Evaluation and Treatment: Rehabilitation  SUBJECTIVE:                                                                                                                                                                                             SUBJECTIVE STATEMENT: R low back is 3/10 currently.    Pt accompanied by: self  PERTINENT HISTORY: Pt notes that he has been recently having some increased difficulty with balance over the past 3-4 months.  Pt has been utilizing a cane for the past 2-3 weeks.  Pt notes that he was not using the cane for balance, but due to the pain he was experiencing in the back that shoots into the LE.   Pt states he has progressed to having to use the cane to catch himself when the pain hits.  Pt has neuropathy in his feet, has been taking medication for it and he believe it is starting to help.  PAIN:  Are you having pain? Yes: NPRS scale: 1.38/10 Pain location: low back Pain description: dull, radiating to the L LE Aggravating factors: Getting up from seated position Relieving factors: Standing/walking Best: 0/10 Worst: 9/10, but does not last long  PRECAUTIONS: Other: shunt in the cranial region, for pressure relief abdominal aortic aneurysm     RED FLAGS: Bowel or bladder incontinence: Yes: pt reports due to old age. and Abdominal aneurysm: Yes: first discovered when he retired from the [employer] BEARING RESTRICTIONS: No  FALLS: Has patient fallen in last 6 months? Yes. Number of falls 1  LIVING ENVIRONMENT: Lives with: lives with their spouse Lives in: House/apartment Stairs: No Has following equipment at home: Single point cane, shower chair, and Grab bars  PLOF: Independent  PATIENT GOALS:  Improve the back pain so it does not immobilize him.     OBJECTIVE:   DIAGNOSTIC FINDINGS:   EXAM: LUMBAR SPINE - COMPLETE 4+ VIEW  IMPRESSION: 1. Multilevel degenerative changes of the lumbar spine, most pronounced at L3-L4. 2. Fusiform infrarenal abdominal aortic aneurysm measuring up to approximately 5.0 cm. Recommend follow-up CT/MR every 6 months and vascular consultation. This recommendation follows ACR consensus guidelines: White Paper of the ACR Incidental Findings Committee II on Vascular Findings. J Am Coll Radiol 2013; 11:914-782. 3.  Aortic Atherosclerosis (ICD10-I70.0).   COGNITION: Overall cognitive status: Within functional limits for tasks assessed  SENSATION: Light touch: Impaired   COORDINATION: Pt has good coordination  POSTURE: rounded shoulders, forward head, and decreased lumbar lordosis  LOWER EXTREMITY ROM:     PROM  Right 01/01/2023 Left 01/01/2023  Hip flexion    Hip extension    Hip abduction    Hip adduction    Hip internal rotation 27 41  Hip external rotation    Knee flexion    Knee extension    Ankle dorsiflexion    Ankle plantarflexion    Ankle inversion    Ankle eversion     (Blank rows = not tested)  LOWER EXTREMITY MMT:    MMT Right Eval Left Eval  Hip flexion 4- 5  Hip extension 4- 4-  Hip abduction 3+ 3+  Hip adduction 3+ 3+  Hip internal rotation    Hip external rotation    Knee flexion 4- 4+  Knee extension 4- 4+  Ankle dorsiflexion 4+ 5  Ankle plantarflexion    Ankle inversion    Ankle eversion    (Blank rows = not tested)   LUMBAR ROM:   Active  AROM  01/01/2023  Flexion Limited with R low back pain  Extension Limited with reproduction or R low back pain   Right lateral flexion Limited with R low back pain  Left lateral flexion Limited with R low back pain (symptoms start earlier compared to R lateral flexion)  Right rotation WFL with slight R low back symptoms.  Left rotation WFL with slight R low back symptoms   (Blank rows = not tested)      TRANSFERS: Assistive device utilized: None  Sit to stand: Complete Independence Stand to sit: Complete Independence   FUNCTIONAL TESTS:  5 times sit to stand: 30.77 sec Timed up and go (TUG): 13.20 sec 6 minute walk test: 1072 ft (01/01/2023) 10 meter walk test: 11.66 sec  (+) repeated flexion test with reproduction of symptoms.     PATIENT SURVEYS:  FOTO 47/59  TODAY'S TREATMENT: DATE: 01/24/23   Therapeutic exercise  Standing bent over hip extension over  table   R 10x5 seconds for 2 sets  L 10x5 seconds for 2 sets  Seated thoracic extension over chair 10x5 seconds for 3 sets  Static standing single leg dead lift with contralateral UE assist   R 10x3  L 6x. R low back discomfort, eases with rest  Pt education with hip extension, glute max strength, decreasing lumbar extension and therefore  stress to low back.   Standing hip flexor stretch with B UE assist   R 30 seconds x 3  L 30 seconds x 3   Seated hip adduction isometrics small blue ball squeeze 10x5 seconds    Improved exercise technique, movement at target joints, use of target muscles after mod verbal, visual, tactile cues.     Decreased R low back pain reported after treatment         PATIENT EDUCATION: Education details: there-ex, HEP Person educated: Patient Education method: Explanation Education comprehension: verbalized understanding  HOME EXERCISE PROGRAM: Access Code: [email or web address] URL: [email or web address] Date: 01/01/2023 Prepared by: Loralyn Freshwater  Exercises - Supine Lower Trunk Rotation  - 1 x daily - 7 x weekly - 3 sets - 10 reps   - Seated Hip Internal Rotation AROM  - 1 x daily - 7 x weekly - 3 sets - 10 reps   - Forward T with Counter Support  - 1 x daily - 7 x weekly - 3 sets - 10 reps   - Seated Thoracic Lumbar Extension  - 1 x daily - 7 x weekly - 3 sets - 10 reps - 5 seconds hold  - Standing Hip Flexor Stretch  - 3 x daily - 7 x weekly - 1 sets - 3 reps - 30 seconds hold  GOALS: Goals reviewed with patient? Yes  SHORT TERM GOALS: Target date: 01/24/2023  Pt will be independent with HEP in order to demonstrate increased ability to perform tasks related to occupation/hobbies. Baseline:  To be given to pt at next appointment Goal status: INITIAL   LONG TERM GOALS: Target date: 03/21/2023  1.  Patient (> 46 years old) will complete five times sit to stand test in < 15 seconds indicating an increased LE strength and improved balance. Baseline: 30.77 sec without use of hands Goal status: INITIAL  2.  Patient will increase FOTO score to equal to or greater than 59 to demonstrate statistically significant improvement in mobility and quality of life.  Baseline: 12/27/22: 47 Goal status: INITIAL   3.  Patient will reduce timed up and go to <11 seconds to reduce  fall risk and demonstrate improved transfer/gait ability. Baseline: 13.20 sec Goal status: INITIAL  4.  Patient will increase 10 meter walk test to >1.42m/s as to improve gait speed for better community ambulation and to reduce fall risk. Baseline: 11.66 sec/8.57 m/s Goal status: INITIAL  5.  Patient will increase six minute walk test distance to >1000 for progression to community ambulator and improve gait ability Baseline: to be performed at next visit; 1072 ft (01/01/2023) Goal status: INITIAL   ASSESSMENT:  CLINICAL IMPRESSION:  Continued working on  thoracic extension, and glute strength to decrease stress to low back. No back pain reported after session. Pt will benefit from continued skilled physical therapy services to decrease pain, improve strength, balance, and function.      OBJECTIVE IMPAIRMENTS: Abnormal gait, decreased activity tolerance, decreased balance, decreased knowledge of use of DME, difficulty walking, decreased ROM, decreased strength, impaired sensation, and pain.   ACTIVITY LIMITATIONS: carrying, lifting,  bending, sitting, standing, squatting, and locomotion level  PARTICIPATION LIMITATIONS: cleaning, laundry, shopping, community activity, and yard work  PERSONAL FACTORS: Age, Fitness, Past/current experiences, Time since onset of injury/illness/exacerbation, and 3+ comorbidities: arthritis, cancer, depression, abdominal aneurism  are also affecting patient's functional outcome.   REHAB POTENTIAL: Good  CLINICAL DECISION MAKING: Stable/uncomplicated  EVALUATION COMPLEXITY: Low  PLAN:  PT FREQUENCY: 1-2x/week  PT DURATION: 12 weeks  PLANNED INTERVENTIONS: Therapeutic exercises, Therapeutic activity, Neuromuscular re-education, Balance training, Gait training, Patient/Family education, Self Care, Joint mobilization, Joint manipulation, Stair training, Vestibular training, Canalith repositioning, DME instructions, Aquatic Therapy, Dry Needling, Spinal  manipulation, Spinal mobilization, Cryotherapy, Moist heat, Taping, Ultrasound, Manual therapy, and Re-evaluation  PLAN FOR NEXT SESSION: complete , continue with assessment of lumbar region for pain complications that he is experiencing.  Loralyn Freshwater PT, DPT Physical Therapist - [facility]  01/24/23, 11:18 AM

## 2023-01-29 ENCOUNTER — Ambulatory Visit: Payer: Medicare Other

## 2023-01-29 DIAGNOSIS — M5459 Other low back pain: Secondary | ICD-10-CM

## 2023-01-29 DIAGNOSIS — G8929 Other chronic pain: Secondary | ICD-10-CM | POA: Diagnosis not present

## 2023-01-29 DIAGNOSIS — R2689 Other abnormalities of gait and mobility: Secondary | ICD-10-CM

## 2023-01-29 DIAGNOSIS — M5441 Lumbago with sciatica, right side: Secondary | ICD-10-CM | POA: Diagnosis not present

## 2023-01-29 NOTE — Therapy (Signed)
OUTPATIENT PHYSICAL THERAPY Treatment   Patient Name: Adam Juarez MRN: [id number] DOB:1941/02/16, 82 y.o., male Today's Date: 01/29/2023   PCP: [name], NP  REFERRING PROVIDER: [name], NP  END OF SESSION:  PT End of Session - 01/29/23 1434     Visit Number 7    Number of Visits 24    Date for PT Re-Evaluation 03/21/23    PT Start Time 1434    PT Stop Time 1516    PT Time Calculation (min) 42 min    Activity Tolerance Patient tolerated treatment well    Behavior During Therapy [facility] for tasks assessed/performed                   Past Medical History:  Diagnosis Date   Arthritis    fingers   Cancer (HCC) 1991, 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 a-fib x 1 episode patient states he "auto corrected" seen at [facility]   GERD (gastroesophageal reflux disease)    Headache    poor posture - none recently   History of kidney stones    Hydrocephalus (HCC)    Hyperlipidemia    Neuropathy    PSVT (paroxysmal supraventricular tachycardia) 2009   Controlled with "breathing process"   Renal stone 9/08, 6/15   Wears dentures    full upper   Past Surgical History:  Procedure Laterality Date   CARDIAC CATHETERIZATION  2008   [place], [place]   CATARACT EXTRACTION [facility] Left 07/27/2015   Procedure: CATARACT EXTRACTION PHACO AND INTRAOCULAR LENS PLACEMENT (IOC);  Surgeon: Lockie Mola, MD;  Location[facility];  Service: Ophthalmology;  Laterality: Left;  TORIC   CATARACT EXTRACTION W/PHACO Right 08/24/2015   Procedure: CATARACT EXTRACTION PHACO AND INTRAOCULAR LENS PLACEMENT (IOC);  Surgeon: Lockie Mola, MD;  Location: [facility];  Service: Ophthalmology;  Laterality: Right;  TORIC   COLONOSCOPY WITH PROPOFOL N/A 08/10/2020   Procedure: COLONOSCOPY WITH PROPOFOL;  Surgeon: Toledo, Boykin Nearing, MD;  Location: [facility];  Service: Gastroenterology;   Laterality: N/A;   HEMORROIDECTOMY  2010   banding   IR ANGIO INTRA EXTRACRAN SEL COM CAROTID INNOMINATE BILAT MOD SED  10/30/2019   IR ANGIO VERTEBRAL SEL VERTEBRAL UNI R MOD SED  10/30/2019   IR RADIOLOGIST EVAL & MGMT  04/11/2020   IR US GUIDE VASC ACCESS RIGHT  10/30/2019   KIDNEY STONE SURGERY  9/08, 6/15   lithotripsy   LAPAROSCOPIC REVISION VENTRICULAR-PERITONEAL (V-P) SHUNT  01/08/2020   Procedure: LAPAROSCOPIC REVISION VENTRICULAR-PERITONEAL (V-P) SHUNT;  Surgeon: Lisbeth Renshaw, MD;  Location: [facility] OR;  Service: Neurosurgery;;   PROSTATE BIOPSY  2007   SKIN CANCER EXCISION     TOENAIL TRIMMING  11/2017   Dr. Orland Jarred   TONSILLECTOMY     VASECTOMY     VENTRICULOPERITONEAL SHUNT Right 01/08/2020   Procedure: SHUNT INSERTION VENTRICULAR-PERITONEAL;  Surgeon: Lisbeth Renshaw, MD;  Location: [facility] OR;  Service: Neurosurgery;  Laterality: Right;   Patient Active Problem List   Diagnosis Date Noted   Imbalance 12/07/2022   Paronychia of right index finger 07/16/2022   Foul smelling urine 06/22/2022   Right lower quadrant abdominal pain 06/22/2022   Sensorineural hearing loss, bilateral 05/25/2022   Gastroesophageal reflux disease 05/25/2022   Other specified counseling 05/25/2022   (Idiopathic) normal pressure hydrocephalus (HCC) 05/25/2022   Paroxysmal tachycardia (HCC) 05/25/2022   Elevated bilirubin 08/04/2021  History of macular degeneration 06/22/2021   Dizziness 12/01/2020   Chronic abdominal pain 11/10/2020   Proctalgia 10/27/2020   Hemorrhoids with complication 10/27/2020   Hemorrhoids 07/21/2020   Colon cancer screening 07/14/2020   Chronic idiopathic constipation 04/21/2020   Prolapsed internal hemorrhoids, grade 3 04/21/2020   VP (ventriculoperitoneal) shunt status 04/21/2020   Bilateral lower abdominal pain 04/21/2020   Abdominal pain, generalized 02/09/2020   Normal pressure hydrocephalus (HCC) 01/08/2020   Excessive daytime sleepiness 10/19/2019   Hip pain  07/15/2019   Medicare annual wellness visit, subsequent 02/06/2019   Memory changes 11/21/2018   Aortic atherosclerosis (HCC) 03/27/2018   PAD (peripheral artery disease) (HCC) 03/27/2018   Smoking history 03/27/2018   Chronic left shoulder pain 03/13/2018   History of systemic reaction to bee sting 01/28/2018   Prediabetes 01/28/2018   History of elevated PSA 01/28/2018   AAA (abdominal aortic aneurysm) (HCC) 05/22/2017   Osteoarthritis 07/13/2016   Chronic foot pain 07/13/2016   Encounter for abdominal aortic aneurysm (AAA) screening 02/17/2016   Mixed hyperlipidemia 11/27/2015   Chronic back pain 11/27/2015   Chronic fatigue 11/27/2015   SVT (supraventricular tachycardia) 09/16/2015   Paroxysmal atrial fibrillation (HCC) 09/16/2015   Frequent PVCs 09/16/2015   Esophageal reflux 09/16/2015   Preventative health care 09/16/2015   Encounter for general adult medical examination without abnormal findings 09/16/2015   Hypertriglyceridemia 04/21/2015    ONSET DATE: Balance: 3-4 months; Back pain: years  REFERRING DIAG:  M54.50,G89.29 (ICD-10-CM) - Chronic right-sided low back pain without sciatica R26.89 (ICD-10-CM) - Imbalance    THERAPY DIAG:  Imbalance  Other low back pain  Chronic midline low back pain with right-sided sciatica  Rationale for Evaluation and Treatment: Rehabilitation  SUBJECTIVE:                                                                                                                                                                                             SUBJECTIVE STATEMENT: Does not feel like he is making much progress. Back hurts just as bad.  Felt loose after last session but woke up the next day with R low back pain. 4/10 R low back pain currently, and 8-9/10 at worst for the past 7 days (Friday morning when pt woke up). Using his SPC less often. Back pain began years ago, about 20 years at least ago.    Pt accompanied by:  self  PERTINENT HISTORY: Pt notes that he has been recently having some increased difficulty with balance over the past 3-4 months.  Pt has been utilizing a cane for the past 2-3 weeks.  Pt notes that he  was not using the cane for balance, but due to the pain he was experiencing in the back that shoots into the LE.  Pt states he has progressed to having to use the cane to catch himself when the pain hits.  Pt has neuropathy in his feet, has been taking medication for it and he believe it is starting to help.    PAIN:  Are you having pain? Yes: NPRS scale: 1.38/10 Pain location: low back Pain description: dull, radiating to the L LE Aggravating factors: Getting up from seated position after watching TV for 30 minutes,  Relieving factors: Standing/walking Best: 0/10 Worst: 9/10, but does not last long  PRECAUTIONS: Other: shunt in the cranial region, for pressure relief abdominal aortic aneurysm     RED FLAGS: Bowel or bladder incontinence: Yes: pt reports due to old age. and Abdominal aneurysm: Yes: first discovered when he retired from the [employer] BEARING RESTRICTIONS: No  FALLS: Has patient fallen in last 6 months? Yes. Number of falls 1  LIVING ENVIRONMENT: Lives with: lives with their spouse Lives in: House/apartment Stairs: No Has following equipment at home: Single point cane, shower chair, and Grab bars  PLOF: Independent  PATIENT GOALS:  Improve the back pain so it does not immobilize him.     OBJECTIVE:   DIAGNOSTIC FINDINGS:   EXAM: LUMBAR SPINE - COMPLETE 4+ VIEW  IMPRESSION: 1. Multilevel degenerative changes of the lumbar spine, most pronounced at L3-L4. 2. Fusiform infrarenal abdominal aortic aneurysm measuring up to approximately 5.0 cm. Recommend follow-up CT/MR every 6 months and vascular consultation. This recommendation follows ACR consensus guidelines: White Paper of the ACR Incidental Findings Committee II on Vascular Findings. J Am  Coll Radiol 2013; 29:562-130. 3.  Aortic Atherosclerosis (ICD10-I70.0).   COGNITION: Overall cognitive status: Within functional limits for tasks assessed  SENSATION: Light touch: Impaired   COORDINATION: Pt has good coordination  POSTURE: rounded shoulders, forward head, and decreased lumbar lordosis  LOWER EXTREMITY ROM:     PROM Right 01/01/2023 Left 01/01/2023  Hip flexion    Hip extension    Hip abduction    Hip adduction    Hip internal rotation 27 41  Hip external rotation    Knee flexion    Knee extension    Ankle dorsiflexion    Ankle plantarflexion    Ankle inversion    Ankle eversion     (Blank rows = not tested)  LOWER EXTREMITY MMT:    MMT Right Eval Left Eval  Hip flexion 4- 5  Hip extension 4- 4-  Hip abduction 3+ 3+  Hip adduction 3+ 3+  Hip internal rotation    Hip external rotation    Knee flexion 4- 4+  Knee extension 4- 4+  Ankle dorsiflexion 4+ 5  Ankle plantarflexion    Ankle inversion    Ankle eversion    (Blank rows = not tested)   LUMBAR ROM:   Active  AROM  01/01/2023  Flexion Limited with R low back pain  Extension Limited with reproduction or R low back pain   Right lateral flexion Limited with R low back pain  Left lateral flexion Limited with R low back pain (symptoms start earlier compared to R lateral flexion)  Right rotation WFL with slight R low back symptoms.  Left rotation WFL with slight R low back symptoms   (Blank rows = not tested)      TRANSFERS: Assistive device utilized: None  Sit to stand:  Complete Independence Stand to sit: Complete Independence   FUNCTIONAL TESTS:  5 times sit to stand: 30.77 sec Timed up and go (TUG): 13.20 sec 6 minute walk test: 1072 ft (01/01/2023) 10 meter walk test: 11.66 sec  (+) repeated flexion test with reproduction of symptoms.     PATIENT SURVEYS:  FOTO 47/59  TODAY'S TREATMENT: DATE: 01/29/23  Manual therapy   Seated STM R lumbar paraspinal muscles to  decrease tension   TTP R lumbar paraspinal muscle   Decreased discomfort.     Therapeutic exercise  Time taken to listen to pt subjective.    Sit <> stand without UE assist 5x at regular chair height  Gait x 10 meters  Standing up from a chair, walking 10 ft forward, then returning 10 ft, then sitting back onto chair 3x  Seated transversus abdominis contraction 10x5 seconds   Then with glute max contraction 10x5 seconds     Improved exercise technique, movement at target joints, use of target muscles after mod verbal, visual, tactile cues.    Back feels better reported after treatment        PATIENT EDUCATION: Education details: there-ex, HEP Person educated: Patient Education method: Explanation Education comprehension: verbalized understanding  HOME EXERCISE PROGRAM: Access Code: [email or web address] URL: [email or web address] Date: 01/01/2023 Prepared by: Loralyn Freshwater  Exercises - Supine Lower Trunk Rotation  - 1 x daily - 7 x weekly - 3 sets - 10 reps   - Seated Hip Internal Rotation AROM  - 1 x daily - 7 x weekly - 3 sets - 10 reps   - Forward T with Counter Support  - 1 x daily - 7 x weekly - 3 sets - 10 reps   - Seated Thoracic Lumbar Extension  - 1 x daily - 7 x weekly - 3 sets - 10 reps - 5 seconds hold  - Standing Hip Flexor Stretch  - 3 x daily - 7 x weekly - 1 sets - 3 reps - 30 seconds hold  - Seated Transversus Abdominis Bracing  - 3 x daily - 7 x weekly - 3 sets - 10 reps - 5 seconds hold    GOALS: Goals reviewed with patient? Yes  SHORT TERM GOALS: Target date: 01/24/2023  Pt will be independent with HEP in order to demonstrate increased ability to perform tasks related to occupation/hobbies. Baseline:  To be given to pt at next appointment Goal status: INITIAL   LONG TERM GOALS: Target date: 03/21/2023  1.  Patient (> 16 years old) will complete five times sit to stand test in < 15 seconds indicating an increased LE strength  and improved balance. Baseline: 30.77 sec without use of hands; 27.24 seconds (01/29/2023) Goal status: Ongoing  2.  Patient will increase FOTO score to equal to or greater than 59 to demonstrate statistically significant improvement in mobility and quality of life.  Baseline: 12/27/22: 47 Goal status: INITIAL   3.  Patient will reduce timed up and go to <11 seconds to reduce fall risk and demonstrate improved transfer/gait ability. Baseline: 13.20 sec; 7.67 seconds average Goal status: met  4.  Patient will increase 10 meter walk test to >1.90m/s as to improve gait speed for better community ambulation and to reduce fall risk. Baseline: 11.66 sec/8.57 m/s; 8.5 seconds  (1.18 m/second average) Goal status: Met  5.  Patient will increase six minute walk test distance to >1000 for progression to community ambulator and improve gait ability Baseline: to be performed at next visit;  1072 ft (01/01/2023) Goal status: INITIAL   ASSESSMENT:  CLINICAL IMPRESSION:   Held off standing hip flexor stretch, single leg dead lift, and bent over hip extension secondary to aforementioned exercises might be placing increased stress to his low back. Worked on transversus abdominis muscle activation as well as manual therapy to decrease R lumbar paraspinal muscle tension to decrease stress to R low back. Back feels better after session reported. Pt also demonstrates improved Five Times Sit <> Stand suggesting improved functional strength, improved TUG times suggesting improved functional mobility and balance as well as improved 10 meter walk test suggesting improved gait speed for better community ambulation.  Pt will benefit from continued skilled physical therapy services to decrease pain, improve strength, balance, and function.      OBJECTIVE IMPAIRMENTS: Abnormal gait, decreased activity tolerance, decreased balance, decreased knowledge of use of DME, difficulty walking, decreased ROM, decreased strength,  impaired sensation, and pain.   ACTIVITY LIMITATIONS: carrying, lifting, bending, sitting, standing, squatting, and locomotion level  PARTICIPATION LIMITATIONS: cleaning, laundry, shopping, community activity, and yard work  PERSONAL FACTORS: Age, Fitness, Past/current experiences, Time since onset of injury/illness/exacerbation, and 3+ comorbidities: arthritis, cancer, depression, abdominal aneurism  are also affecting patient's functional outcome.   REHAB POTENTIAL: Good  CLINICAL DECISION MAKING: Stable/uncomplicated  EVALUATION COMPLEXITY: Low  PLAN:  PT FREQUENCY: 1-2x/week  PT DURATION: 12 weeks  PLANNED INTERVENTIONS: Therapeutic exercises, Therapeutic activity, Neuromuscular re-education, Balance training, Gait training, Patient/Family education, Self Care, Joint mobilization, Joint manipulation, Stair training, Vestibular training, Canalith repositioning, DME instructions, Aquatic Therapy, Dry Needling, Spinal manipulation, Spinal mobilization, Cryotherapy, Moist heat, Taping, Ultrasound, Manual therapy, and Re-evaluation  PLAN FOR NEXT SESSION: complete , continue with assessment of lumbar region for pain complications that he is experiencing.  Loralyn Freshwater PT, DPT Physical Therapist - [facility]  01/29/23, 7:19 PM

## 2023-02-01 ENCOUNTER — Ambulatory Visit: Payer: Medicare Other

## 2023-02-01 DIAGNOSIS — G8929 Other chronic pain: Secondary | ICD-10-CM

## 2023-02-01 DIAGNOSIS — R2689 Other abnormalities of gait and mobility: Secondary | ICD-10-CM

## 2023-02-01 DIAGNOSIS — M5459 Other low back pain: Secondary | ICD-10-CM

## 2023-02-01 NOTE — Therapy (Signed)
OUTPATIENT PHYSICAL THERAPY Discharge Summary   Patient Name: Adam Juarez MRN: [id number] DOB:06/25/1940, 82 y.o., male Today's Date: 02/01/2023   PCP: [name], NP  REFERRING PROVIDER: [name], NP  END OF SESSION:  PT End of Session - 02/01/23 1031     Visit Number 8    Number of Visits 24    Date for PT Re-Evaluation 03/21/23    PT Start Time 1031    PT Stop Time 1041    PT Time Calculation (min) 10 min    Activity Tolerance Patient tolerated treatment well    Behavior During Therapy [facility] for tasks assessed/performed                    Past Medical History:  Diagnosis Date   Arthritis    fingers   Cancer (HCC) 1991, 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 a-fib x 1 episode patient states he "auto corrected" seen at [facility]   GERD (gastroesophageal reflux disease)    Headache    poor posture - none recently   History of kidney stones    Hydrocephalus (HCC)    Hyperlipidemia    Neuropathy    PSVT (paroxysmal supraventricular tachycardia) 2009   Controlled with "breathing process"   Renal stone 9/08, 6/15   Wears dentures    full upper   Past Surgical History:  Procedure Laterality Date   CARDIAC CATHETERIZATION  2008   [place], [place]   CATARACT EXTRACTION [facility] Left 07/27/2015   Procedure: CATARACT EXTRACTION PHACO AND INTRAOCULAR LENS PLACEMENT (IOC);  Surgeon: Lockie Mola, MD;  Location: [facility];  Service: Ophthalmology;  Laterality: Left;  TORIC   CATARACT EXTRACTION W/PHACO Right 08/24/2015   Procedure: CATARACT EXTRACTION PHACO AND INTRAOCULAR LENS PLACEMENT (IOC);  Surgeon: Lockie Mola, MD;  Location: [facility];  Service: Ophthalmology;  Laterality: Right;  TORIC   COLONOSCOPY WITH PROPOFOL N/A 08/10/2020   Procedure: COLONOSCOPY WITH PROPOFOL;  Surgeon: Toledo, Boykin Nearing, MD;  Location: [facility];  Service: Gastroenterology;   Laterality: N/A;   HEMORROIDECTOMY  2010   banding   IR ANGIO INTRA EXTRACRAN SEL COM CAROTID INNOMINATE BILAT MOD SED  10/30/2019   IR ANGIO VERTEBRAL SEL VERTEBRAL UNI R MOD SED  10/30/2019   IR RADIOLOGIST EVAL & MGMT  04/11/2020   IR US GUIDE VASC ACCESS RIGHT  10/30/2019   KIDNEY STONE SURGERY  9/08, 6/15   lithotripsy   LAPAROSCOPIC REVISION VENTRICULAR-PERITONEAL (V-P) SHUNT  01/08/2020   Procedure: LAPAROSCOPIC REVISION VENTRICULAR-PERITONEAL (V-P) SHUNT;  Surgeon: Lisbeth Renshaw, MD;  Location: [facility] OR;  Service: Neurosurgery;;   PROSTATE BIOPSY  2007   SKIN CANCER EXCISION     TOENAIL TRIMMING  11/2017   Dr. Orland Jarred   TONSILLECTOMY     VASECTOMY     VENTRICULOPERITONEAL SHUNT Right 01/08/2020   Procedure: SHUNT INSERTION VENTRICULAR-PERITONEAL;  Surgeon: Lisbeth Renshaw, MD;  Location: [facility] OR;  Service: Neurosurgery;  Laterality: Right;   Patient Active Problem List   Diagnosis Date Noted   Imbalance 12/07/2022   Paronychia of right index finger 07/16/2022   Foul smelling urine 06/22/2022   Right lower quadrant abdominal pain 06/22/2022   Sensorineural hearing loss, bilateral 05/25/2022   Gastroesophageal reflux disease 05/25/2022   Other specified counseling 05/25/2022   (Idiopathic) normal pressure hydrocephalus (HCC) 05/25/2022   Paroxysmal tachycardia (HCC) 05/25/2022   Elevated bilirubin 08/04/2021  History of macular degeneration 06/22/2021   Dizziness 12/01/2020   Chronic abdominal pain 11/10/2020   Proctalgia 10/27/2020   Hemorrhoids with complication 10/27/2020   Hemorrhoids 07/21/2020   Colon cancer screening 07/14/2020   Chronic idiopathic constipation 04/21/2020   Prolapsed internal hemorrhoids, grade 3 04/21/2020   VP (ventriculoperitoneal) shunt status 04/21/2020   Bilateral lower abdominal pain 04/21/2020   Abdominal pain, generalized 02/09/2020   Normal pressure hydrocephalus (HCC) 01/08/2020   Excessive daytime sleepiness 10/19/2019   Hip pain  07/15/2019   Medicare annual wellness visit, subsequent 02/06/2019   Memory changes 11/21/2018   Aortic atherosclerosis (HCC) 03/27/2018   PAD (peripheral artery disease) (HCC) 03/27/2018   Smoking history 03/27/2018   Chronic left shoulder pain 03/13/2018   History of systemic reaction to bee sting 01/28/2018   Prediabetes 01/28/2018   History of elevated PSA 01/28/2018   AAA (abdominal aortic aneurysm) (HCC) 05/22/2017   Osteoarthritis 07/13/2016   Chronic foot pain 07/13/2016   Encounter for abdominal aortic aneurysm (AAA) screening 02/17/2016   Mixed hyperlipidemia 11/27/2015   Chronic back pain 11/27/2015   Chronic fatigue 11/27/2015   SVT (supraventricular tachycardia) 09/16/2015   Paroxysmal atrial fibrillation (HCC) 09/16/2015   Frequent PVCs 09/16/2015   Esophageal reflux 09/16/2015   Preventative health care 09/16/2015   Encounter for general adult medical examination without abnormal findings 09/16/2015   Hypertriglyceridemia 04/21/2015    ONSET DATE: Balance: 3-4 months; Back pain: years  REFERRING DIAG:  M54.50,G89.29 (ICD-10-CM) - Chronic right-sided low back pain without sciatica R26.89 (ICD-10-CM) - Imbalance    THERAPY DIAG:  Imbalance  Other low back pain  Chronic midline low back pain with right-sided sciatica  Rationale for Evaluation and Treatment: Rehabilitation  SUBJECTIVE:                                                                                                                                                                                             SUBJECTIVE STATEMENT: Does not feel like he is making progress with back pain and wants to hold off PT.     Pt accompanied by: self  PERTINENT HISTORY: Pt notes that he has been recently having some increased difficulty with balance over the past 3-4 months.  Pt has been utilizing a cane for the past 2-3 weeks.  Pt notes that he was not using the cane for balance, but due to the pain he  was experiencing in the back that shoots into the LE.  Pt states he has progressed to having to use the cane to catch himself when the pain hits.  Pt has neuropathy in his feet, has  been taking medication for it and he believe it is starting to help.    PAIN:  Are you having pain? Yes: NPRS scale: 1.38/10 Pain location: low back Pain description: dull, radiating to the L LE Aggravating factors: Getting up from seated position after watching TV for 30 minutes,  Relieving factors: Standing/walking Best: 0/10 Worst: 9/10, but does not last long  PRECAUTIONS: Other: shunt in the cranial region, for pressure relief abdominal aortic aneurysm     RED FLAGS: Bowel or bladder incontinence: Yes: pt reports due to old age. and Abdominal aneurysm: Yes: first discovered when he retired from the [employer] BEARING RESTRICTIONS: No  FALLS: Has patient fallen in last 6 months? Yes. Number of falls 1  LIVING ENVIRONMENT: Lives with: lives with their spouse Lives in: House/apartment Stairs: No Has following equipment at home: Single point cane, shower chair, and Grab bars  PLOF: Independent  PATIENT GOALS:  Improve the back pain so it does not immobilize him.     OBJECTIVE:   DIAGNOSTIC FINDINGS:   EXAM: LUMBAR SPINE - COMPLETE 4+ VIEW  IMPRESSION: 1. Multilevel degenerative changes of the lumbar spine, most pronounced at L3-L4. 2. Fusiform infrarenal abdominal aortic aneurysm measuring up to approximately 5.0 cm. Recommend follow-up CT/MR every 6 months and vascular consultation. This recommendation follows ACR consensus guidelines: White Paper of the ACR Incidental Findings Committee II on Vascular Findings. J Am Coll Radiol 2013; 81:191-478. 3.  Aortic Atherosclerosis (ICD10-I70.0).   COGNITION: Overall cognitive status: Within functional limits for tasks assessed  SENSATION: Light touch: Impaired   COORDINATION: Pt has good coordination  POSTURE: rounded  shoulders, forward head, and decreased lumbar lordosis  LOWER EXTREMITY ROM:     PROM Right 01/01/2023 Left 01/01/2023  Hip flexion    Hip extension    Hip abduction    Hip adduction    Hip internal rotation 27 41  Hip external rotation    Knee flexion    Knee extension    Ankle dorsiflexion    Ankle plantarflexion    Ankle inversion    Ankle eversion     (Blank rows = not tested)  LOWER EXTREMITY MMT:    MMT Right Eval Left Eval  Hip flexion 4- 5  Hip extension 4- 4-  Hip abduction 3+ 3+  Hip adduction 3+ 3+  Hip internal rotation    Hip external rotation    Knee flexion 4- 4+  Knee extension 4- 4+  Ankle dorsiflexion 4+ 5  Ankle plantarflexion    Ankle inversion    Ankle eversion    (Blank rows = not tested)   LUMBAR ROM:   Active  AROM  01/01/2023  Flexion Limited with R low back pain  Extension Limited with reproduction or R low back pain   Right lateral flexion Limited with R low back pain  Left lateral flexion Limited with R low back pain (symptoms start earlier compared to R lateral flexion)  Right rotation WFL with slight R low back symptoms.  Left rotation WFL with slight R low back symptoms   (Blank rows = not tested)      TRANSFERS: Assistive device utilized: None  Sit to stand: Complete Independence Stand to sit: Complete Independence   FUNCTIONAL TESTS:  5 times sit to stand: 30.77 sec Timed up and go (TUG): 13.20 sec 6 minute walk test: 1072 ft (01/01/2023) 10 meter walk test: 11.66 sec  (+) repeated flexion test with reproduction of symptoms.  PATIENT SURVEYS:  FOTO 47/59  TODAY'S TREATMENT: DATE: 02/01/23  None today   PATIENT EDUCATION: Education details: there-ex, HEP Person educated: Patient Education method: Explanation Education comprehension: verbalized understanding  HOME EXERCISE PROGRAM: Access Code: [email or web address] URL: [email or web address] Date: 01/01/2023 Prepared by: Loralyn Freshwater  Exercises - Supine Lower Trunk Rotation  - 1 x daily - 7 x weekly - 3 sets - 10 reps   - Seated Hip Internal Rotation AROM  - 1 x daily - 7 x weekly - 3 sets - 10 reps   - Forward T with Counter Support  - 1 x daily - 7 x weekly - 3 sets - 10 reps   - Seated Thoracic Lumbar Extension  - 1 x daily - 7 x weekly - 3 sets - 10 reps - 5 seconds hold  - Standing Hip Flexor Stretch  - 3 x daily - 7 x weekly - 1 sets - 3 reps - 30 seconds hold  - Seated Transversus Abdominis Bracing  - 3 x daily - 7 x weekly - 3 sets - 10 reps - 5 seconds hold    GOALS: Goals reviewed with patient? Yes  SHORT TERM GOALS: Target date: 01/24/2023  Pt will be independent with HEP in order to demonstrate increased ability to perform tasks related to occupation/hobbies. Baseline:  To be given to pt at next appointment Goal status: INITIAL   LONG TERM GOALS: Target date: 03/21/2023  1.  Patient (> 57 years old) will complete five times sit to stand test in < 15 seconds indicating an increased LE strength and improved balance. Baseline: 30.77 sec without use of hands; 27.24 seconds (01/29/2023) Goal status: Ongoing  2.  Patient will increase FOTO score to equal to or greater than 59 to demonstrate statistically significant improvement in mobility and quality of life.  Baseline: 12/27/22: 47 Goal status: INITIAL   3.  Patient will reduce timed up and go to <11 seconds to reduce fall risk and demonstrate improved transfer/gait ability. Baseline: 13.20 sec; 7.67 seconds average Goal status: met  4.  Patient will increase 10 meter walk test to >1.17m/s as to improve gait speed for better community ambulation and to reduce fall risk. Baseline: 11.66 sec/8.57 m/s; 8.5 seconds  (1.18 m/second average) Goal status: Met  5.  Patient will increase six minute walk test distance to >1000 for progression to community ambulator and improve gait ability Baseline: to be performed at next visit; 1072 ft  (01/01/2023) Goal status: INITIAL   ASSESSMENT:  CLINICAL IMPRESSION: Pt demonstrates improved Five Times Sit <> Stand suggesting improved functional strength, improved TUG times suggesting improved functional mobility and balance as well as improved 10 meter walk test suggesting improved gait speed for better community ambulation. Pt however reports no improvement in low back pain and wants to stop PT at this time. Skilled physical therapy services discharged secondary to pt request.        OBJECTIVE IMPAIRMENTS: Abnormal gait, decreased activity tolerance, decreased balance, decreased knowledge of use of DME, difficulty walking, decreased ROM, decreased strength, impaired sensation, and pain.   ACTIVITY LIMITATIONS: carrying, lifting, bending, sitting, standing, squatting, and locomotion level  PARTICIPATION LIMITATIONS: cleaning, laundry, shopping, community activity, and yard work  PERSONAL FACTORS: Age, Fitness, Past/current experiences, Time since onset of injury/illness/exacerbation, and 3+ comorbidities: arthritis, cancer, depression, abdominal aneurism  are also affecting patient's functional outcome.   REHAB POTENTIAL: Good  CLINICAL DECISION MAKING: Stable/uncomplicated  EVALUATION COMPLEXITY: Low  PLAN:  PT FREQUENCY: 1-2x/week  PT DURATION: 12 weeks  PLANNED INTERVENTIONS: Therapeutic exercises, Therapeutic activity, Neuromuscular re-education, Balance training, Gait training, Patient/Family education, Self Care, Joint mobilization, Joint manipulation, Stair training, Vestibular training, Canalith repositioning, DME instructions, Aquatic Therapy, Dry Needling, Spinal manipulation, Spinal mobilization, Cryotherapy, Moist heat, Taping, Ultrasound, Manual therapy, and Re-evaluation  PLAN FOR NEXT SESSION: complete , continue with assessment of lumbar region for pain complications that he is experiencing.    Thank you for your referral.  Loralyn Freshwater PT,  DPT Physical Therapist - [facility]  02/01/23, 10:49 AM

## 2023-02-05 ENCOUNTER — Ambulatory Visit: Payer: Medicare Other

## 2023-02-12 ENCOUNTER — Ambulatory Visit: Payer: Medicare Other | Attending: Medical | Admitting: Medical

## 2023-02-12 ENCOUNTER — Encounter: Payer: Self-pay | Admitting: Medical

## 2023-02-12 VITALS — BP 114/72 | HR 67 | Ht 70.0 in | Wt 192.2 lb

## 2023-02-12 DIAGNOSIS — I471 Supraventricular tachycardia, unspecified: Secondary | ICD-10-CM | POA: Insufficient documentation

## 2023-02-12 DIAGNOSIS — E782 Mixed hyperlipidemia: Secondary | ICD-10-CM | POA: Insufficient documentation

## 2023-02-12 DIAGNOSIS — I700 Atherosclerosis of aorta: Secondary | ICD-10-CM | POA: Insufficient documentation

## 2023-02-12 DIAGNOSIS — I714 Abdominal aortic aneurysm, without rupture, unspecified: Secondary | ICD-10-CM | POA: Diagnosis not present

## 2023-02-12 DIAGNOSIS — I480 Paroxysmal atrial fibrillation: Secondary | ICD-10-CM | POA: Insufficient documentation

## 2023-02-12 NOTE — Progress Notes (Signed)
Cardiology Office Note:    Date:  02/12/2023   ID:  Adam Juarez, DOB 08-Jan-1941, MRN [id number]  PCP:  [name], NP  [facility] Cardiologist:  Julien Nordmann, MD  [facility] Electrophysiologist:  None   Referring MD: [name], NP   Chief Complaint: 3 month follow-up  History of Present Illness:    Adam Juarez is a 82 y.o. male with a hx of PAD h/o smoking, moderate aortic atherosclerosis, paroxysmal Afib, PVCs, pSVT, infrarenal abdominal aortic aneurysm who presents for 3 month follow-up.    The patient has a h/o Afib on Eliquis. Heart monitor in 2023 showed NSR with afib 4% burden, PVCs (8% burden). BB was increased.    Stress test in 2020 showed no ischemia, LVEF 55 to 65%, low risk study.   Patient has a history of infrarenal abdominal aortic aneurysm followed by vascular surgery. 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 Plan to follow-up with US duplex every 6 months.    Patient was last seen 12/10/2022 and was overall doing well from a cardiac perspective. Echo for murmur was ordered. Echo 01/18/23 showed LVEF 50-55%, G1DD, mild MR, mild AI, mild aortic dilation of aorta.   Today, the echo was reviewed in detail. He saw vascular surgery in August for AAA. He denies chest pain, SOB and lower leg edema. He reports panic attacks every 3-5 weeks.They can occur at any time with no inciting event. He feels claustrophobia and he gets SOB. Feels like an out of body experience. Also has diaphoresis. He tried anti-anxiety meds. Symptoms may occur a couple days and then he is back to normal. He saw neurology and ruled out for seizure. He has no cardiac symptoms during these events.   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 a-fib x 1 episode patient  states he "auto corrected" seen at [facility]   GERD (gastroesophageal reflux disease)    Headache    poor posture - none recently   History of kidney stones    Hydrocephalus (HCC)    Hyperlipidemia    Neuropathy    PSVT (paroxysmal supraventricular tachycardia) (HCC) 2009   Controlled with "breathing process"   Renal stone 9/08, 6/15   Wears dentures    full upper    Past Surgical History:  Procedure Laterality Date   CARDIAC CATHETERIZATION  2008   [place], [place]   CATARACT EXTRACTION [facility] Left 07/27/2015   Procedure: CATARACT EXTRACTION PHACO AND INTRAOCULAR LENS PLACEMENT (IOC);  Surgeon: Lockie Mola, MD;  Location: [facility];  Service: Ophthalmology;  Laterality: Left;  TORIC   CATARACT EXTRACTION W/PHACO Right 08/24/2015   Procedure: CATARACT EXTRACTION PHACO AND INTRAOCULAR LENS PLACEMENT (IOC);  Surgeon: Lockie Mola, MD;  Location: [facility];  Service: Ophthalmology;  Laterality: Right;  TORIC   COLONOSCOPY WITH PROPOFOL N/A 08/10/2020   Procedure: COLONOSCOPY WITH PROPOFOL;  Surgeon: Toledo, Boykin Nearing, MD;  Location: [facility];  Service: Gastroenterology;  Laterality: N/A;   HEMORROIDECTOMY  2010   banding   IR ANGIO INTRA EXTRACRAN SEL COM CAROTID INNOMINATE BILAT MOD SED  10/30/2019   IR ANGIO VERTEBRAL SEL VERTEBRAL UNI R MOD SED  10/30/2019   IR RADIOLOGIST EVAL & MGMT  04/11/2020  IR US GUIDE VASC ACCESS RIGHT  10/30/2019   KIDNEY STONE SURGERY  9/08, 6/15   lithotripsy   LAPAROSCOPIC REVISION VENTRICULAR-PERITONEAL (V-P) SHUNT  01/08/2020   Procedure: LAPAROSCOPIC REVISION VENTRICULAR-PERITONEAL (V-P) SHUNT;  Surgeon: Lisbeth Renshaw, MD;  Location: [facility] OR;  Service: Neurosurgery;;   PROSTATE BIOPSY  2007   SKIN CANCER EXCISION     TOENAIL TRIMMING  11/2017   Dr. Orland Jarred   TONSILLECTOMY     VASECTOMY     VENTRICULOPERITONEAL SHUNT Right 01/08/2020   Procedure: SHUNT INSERTION VENTRICULAR-PERITONEAL;  Surgeon:  Lisbeth Renshaw, MD;  Location: [facility] OR;  Service: Neurosurgery;  Laterality: Right;    Current Medications: Current Meds  Medication Sig   acetaminophen (TYLENOL) 500 MG tablet Take 1,000 mg by mouth every 8 (eight) hours as needed for moderate pain.   apixaban (ELIQUIS) 5 MG TABS tablet TAKE 1 TABLET TWICE A DAY   ascorbic acid (VITAMIN C) 500 MG tablet Take 500 mg by mouth daily.   atorvastatin (LIPITOR) 20 MG tablet Take 1 tablet (20 mg total) by mouth daily. for cholesterol.   CALCIUM-VITAMIN D PO Take 600-800 mg by mouth daily.   EPINEPHRINE 0.3 mg/0.3 mL IJ SOAJ injection INJECT 0.3 ML (0.3 MG TOTAL) INTO THE MUSCLE AS NEEDED FOR ANAPHYLAXIS   Magnesium Oxide 500 MG TABS Take 500 mg by mouth daily.   Melatonin 1 MG CAPS Take 1 mg by mouth at bedtime as needed (sleep).   memantine (NAMENDA) 5 MG tablet Take 5 mg by mouth 2 (two) times daily.   metoprolol succinate (TOPROL-XL) 50 MG 24 hr tablet Take 1 tablet (50 mg total) by mouth 2 (two) times daily.   Multiple Vitamin (MULTIVITAMIN) tablet Take 1 tablet by mouth daily.   Multiple Vitamins-Minerals (PRESERVISION AREDS 2 PO) Take 1 capsule by mouth 2 (two) times daily.    omeprazole (PRILOSEC) 20 MG capsule Take 1 capsule (20 mg total) by mouth 2 (two) times daily before a meal. For heartburn.   Polyethyl Glycol-Propyl Glycol (SYSTANE OP) Apply 1 drop to eye 3 (three) times daily as needed (dry eyes).   Potassium Gluconate 550 MG TABS Take 550 mg by mouth daily.   Probiotic Product (PROBIOTIC DAILY PO) Take 1 capsule by mouth daily.    vitamin B-12 (CYANOCOBALAMIN) 1000 MCG tablet Take 1,000 mcg by mouth daily.     Allergies:   Bee venom, Other, and Tramadol   Social History   Socioeconomic History   Marital status: Married    Spouse name: Not on file   Number of children: Not on file   Years of education: Not on file   Highest education level: Not on file  Occupational History   Not on file  Tobacco Use   Smoking status:  Former    Current packs/day: 0.00    Average packs/day: 0.5 packs/day for 38.0 years (19.0 ttl pk-yrs)    Types: Cigarettes    Start date: 07/14/1959    Quit date: 07/13/1997    Years since quitting: 25.6   Smokeless tobacco: Never  Vaping Use   Vaping status: Never Used  Substance and Sexual Activity   Alcohol use: No   Drug use: No   Sexual activity: Not on file  Other Topics Concern   Not on file  Social History Narrative   Not on file   Social Determinants of Health   Financial Resource Strain: Low Risk  (05/25/2022)   Overall Financial Resource Strain (CARDIA)    Difficulty of  Paying Living Expenses: Not hard at all  Food Insecurity: No Food Insecurity (05/25/2022)   Hunger Vital Sign    Worried About Running Out of Food in the Last Year: Never true    Ran Out of Food in the Last Year: Never true  Transportation Needs: No Transportation Needs (05/25/2022)   PRAPARE - Administrator, Civil Service (Medical): No    Lack of Transportation (Non-Medical): No  Physical Activity: Inactive (05/25/2022)   Exercise Vital Sign    Days of Exercise per Week: 0 days    Minutes of Exercise per Session: 0 min  Stress: No Stress Concern Present (05/25/2022)   [employer] of Occupational Health - Occupational Stress Questionnaire    Feeling of Stress : Not at all  Social Connections: Socially Integrated (05/25/2022)   Social Connection and Isolation Panel [NHANES]    Frequency of Communication with Friends and Family: Twice a week    Frequency of Social Gatherings with Friends and Family: Twice a week    Attends Religious Services: More than 4 times per year    Active Member of [employer] or Organizations: Yes    Attends Engineer, structural: More than 4 times per year    Marital Status: Married     Family History: The patient's family history includes AAA (abdominal aortic aneurysm) in his father; Arthritis in his mother; Parkinson's disease in his mother.  ROS:    Please see the history of present illness.     All other systems reviewed and are negative.  EKGs/Labs/Other Studies Reviewed:    The following studies were reviewed today:  Echo 01/2023 1. Left ventricular ejection fraction, by estimation, is 50 to 55%. The  left ventricle has low normal function. The left ventricle has no regional  wall motion abnormalities. Left ventricular diastolic parameters are  consistent with Grade I diastolic  dysfunction (impaired relaxation). The average left ventricular global  longitudinal strain is -17.4 %. The global longitudinal strain is normal.   2. Right ventricular systolic function is normal. The right ventricular  size is normal.   3. Right atrial size was mildly dilated.   4. The mitral valve is normal in structure. Mild mitral valve  regurgitation.   5. The aortic valve is tricuspid. Aortic valve regurgitation is mild.   6. Aortic dilatation noted. There is mild dilatation of the ascending  aorta, measuring 39 mm.   7. The inferior vena cava is normal in size with greater than 50%  respiratory variability, suggesting right atrial pressure of 3 mmHg.   Heart monitor 09/2021 Event monitor Patch Wear Time:  8 days and 2 hours (2023-05-05T16:40:56-0400 to 2023-05-13T19:06:03-399)   Normal sinus rhythm Atrial Fibrillation occurred (4% burden), ranging from 53-130 bpm (avg of 78 bpm), the longest lasting 7 hours 58 mins with an avg rate of 78 bpm. Patient had a min HR of 53 bpm, max HR of 174 bpm, and avg HR of 67 bpm.     2 Ventricular Tachycardia runs occurred, the run with the fastest interval lasting 7 beats with a max rate of 174 bpm (avg 127 bpm); the run with the fastest interval was also the longest.    8 Supraventricular Tachycardia runs occurred, the run with the fastest interval lasting 12 beats with a max rate of 144 bpm, the longest lasting 10 beats with an avg rate of 103 bpm.  Isolated SVEs were rare (<1.0%), SVE Couplets were  rare (<1.0%), and SVE Triplets were rare (<  1.0%).   Isolated VEs were frequent (8.2%, 63995), VE Couplets were rare (<1.0%, 313), and VE Triplets were rare (<1.0%, 9). Ventricular Bigeminy and Trigeminy were present.    Patient triggered events (4) associated with PVCs Atrial fibrillation was not triggered   Signed, Dossie Arbour, MD, Ph.D [facility]  Echo 2018 Study Conclusions   - Left ventricle: The cavity size was normal. There was mild    concentric hypertrophy. Systolic function was normal. The    estimated ejection fraction was in the range of 55% to 60%. Wall    motion was normal; there were no regional wall motion    abnormalities. Doppler parameters are consistent with abnormal    left ventricular relaxation (grade 1 diastolic dysfunction).  - Aortic valve: There was very mild stenosis. Mean gradient (S): 6    mm Hg. Valve area (VTI): 2.32 cm^2.  - Aorta: Aortic arch diameter: 37 mm.  - Left atrium: The atrium was mildly dilated.    EKG:  EKG is ordered today.  The ekg ordered today demonstrates NSR 67bpm, TWI III, PAC  Recent Labs: 09/21/2022: ALT 16; BUN 20; Creatinine, Ser 0.81; Hemoglobin 14.6; Platelets 119; Potassium 4.0; Sodium 139  Recent Lipid Panel    Component Value Date/Time   CHOL 124 09/13/2021 0859   TRIG 262 (H) 09/13/2021 0859   HDL 32 (L) 09/13/2021 0859   CHOLHDL 3.9 09/13/2021 0859   VLDL 52 (H) 09/13/2021 0859   LDLCALC 40 09/13/2021 0859   LDLDIRECT 41.0 07/22/2020 0834    Physical Exam:    VS:  BP 114/72 (BP Location: Left Arm, Patient Position: Sitting, Cuff Size: Normal)   Pulse 67   Ht 5\' 10"  (1.778 m)   Wt 192 lb 3.2 oz (87.2 kg)   SpO2 97%   BMI 27.58 kg/m     Wt Readings from Last 3 Encounters:  02/12/23 192 lb 3.2 oz (87.2 kg)  01/11/23 199 lb 3.2 oz (90.4 kg)  12/10/22 199 lb 3.2 oz (90.4 kg)     GEN:  Well nourished, well developed in no acute distress HEENT: Normal NECK: No JVD; No carotid bruits LYMPHATICS: No  lymphadenopathy CARDIAC: RRR, no murmurs, rubs, gallops RESPIRATORY:  Clear to auscultation without rales, wheezing or rhonchi  ABDOMEN: Soft, non-tender, non-distended MUSCULOSKELETAL:  No edema; No deformity  SKIN: Warm and dry NEUROLOGIC:  Alert and oriented x 3 PSYCHIATRIC:  Normal affect   ASSESSMENT:    1. Paroxysmal A-fib (HCC)   2. SVT (supraventricular tachycardia) (HCC)   3. Aortic atherosclerosis (HCC)   4. Hyperlipidemia, mixed   5. Abdominal aortic aneurysm (AAA) without rupture, unspecified part (HCC)    PLAN:    In order of problems listed above:  Paroxysmal Afib No further afib episodes per patient report. Continue Eliquis 5mg  BID for stroke ppx. Continue Toprol 50mg  BID for rate control.   SVT Patient reports occasional palpitations. Continue BB therapy.  Aortic atherosclerosis HLD LDL 48, HDL 32, TG 262, total chol 124. Continue Lipitor daily.   AAA Most recent duplex showed 4.3cm. He is following with vascular surgery. Plan to repeat US every 6 months.   Disposition: Follow up in 6 month(s) with MD/APP    Signed, Natonya Finstad David Stall, PA-C  02/12/2023 9:40 AM    [facility]

## 2023-02-12 NOTE — Patient Instructions (Signed)
Medication Instructions:  Your Physician recommend you continue on your current medication as directed.    *If you need a refill on your cardiac medications before your next appointment, please call your pharmacy*   Follow-Up: At [facility], you and your health needs are our priority.  As part of our continuing mission to provide you with exceptional heart care, we have created designated Provider Care Teams.  These Care Teams include your primary Cardiologist (physician) and Advanced Practice Providers (APPs -  Physician Assistants and Nurse Practitioners) who all work together to provide you with the care you need, when you need it.  We recommend signing up for the patient portal called "MyChart".  Sign up information is provided on this After Visit Summary.  MyChart is used to connect with patients for Virtual Visits (Telemedicine).  Patients are able to view lab/test results, encounter notes, upcoming appointments, etc.  Non-urgent messages can be sent to your provider as well.   To learn more about what you can do with MyChart, go to [email or web address].    Your next appointment:   6 month(s)  Provider:   You may see Julien Nordmann, MD or one of the following Advanced Practice Providers on your designated Care Team:   Cadence [place], [place]

## 2023-02-13 ENCOUNTER — Other Ambulatory Visit: Payer: Self-pay | Admitting: Primary Care

## 2023-02-13 DIAGNOSIS — K219 Gastro-esophageal reflux disease without esophagitis: Secondary | ICD-10-CM

## 2023-02-13 NOTE — Telephone Encounter (Signed)
Patient is due for follow up, can we get him scheduled for November? Please schedule, thank you!

## 2023-02-13 NOTE — Telephone Encounter (Signed)
Patient has been scheduled

## 2023-02-18 DIAGNOSIS — H353124 Nonexudative age-related macular degeneration, left eye, advanced atrophic with subfoveal involvement: Secondary | ICD-10-CM | POA: Diagnosis not present

## 2023-02-27 DIAGNOSIS — R2689 Other abnormalities of gait and mobility: Secondary | ICD-10-CM

## 2023-02-27 DIAGNOSIS — M545 Low back pain, unspecified: Secondary | ICD-10-CM

## 2023-03-04 ENCOUNTER — Encounter: Payer: Self-pay | Admitting: Family Medicine

## 2023-03-04 ENCOUNTER — Ambulatory Visit (INDEPENDENT_AMBULATORY_CARE_PROVIDER_SITE_OTHER): Payer: Medicare Other | Admitting: Family Medicine

## 2023-03-04 VITALS — BP 100/62 | HR 70 | Temp 97.7°F | Ht 70.0 in | Wt 197.1 lb

## 2023-03-04 DIAGNOSIS — G8929 Other chronic pain: Secondary | ICD-10-CM

## 2023-03-04 DIAGNOSIS — M51371 Other intervertebral disc degeneration, lumbosacral region with lower extremity pain only: Secondary | ICD-10-CM | POA: Diagnosis not present

## 2023-03-04 DIAGNOSIS — M545 Low back pain, unspecified: Secondary | ICD-10-CM

## 2023-03-04 MED ORDER — PREDNISONE 20 MG PO TABS: ORAL_TABLET | ORAL | 0 refills | Status: DC

## 2023-03-04 NOTE — Progress Notes (Signed)
Adam Juarez T. Runa Whittingham, MD, CAQ Sports Medicine [facility] at [facility] [address] [place], [address]  Phone: [phone]  FAX: [phone]  Adam Juarez - 82 y.o. male  MRN [id number]  Date of Birth: March 15, 1941  Date: 03/04/2023  PCP: [name], NP  Referral: [name], NP  Chief Complaint  Patient presents with   Back Pain    Lower   Subjective:   Adam Juarez is a 82 y.o. very pleasant male patient with Body mass index is 28.28 kg/m. who presents with the following:  F/u multi-level DDD thoracolumbar spine with chronic back pain off and on for years.   He is on Eliquis.  Namenda  This time in particular he has been having symptoms for about 2 weeks with worsening pain in the low back on the right and some referred pain into the buttocks.  I did pull up independently the patient's CT angiogram of the chest and CT abdomen and pelvis, and he has multilevel degenerative disc disease from essentially the entirety of the cervical, thoracic, and lumbar spine.  Off and on would have some back pain that would come and go. Now it has stayed and down on the lower back to the r side.  Ongoing now for a couple of weeks.   Tylenol TID right now.   Worried that it could be cancer.   B lower leg numbness -this has been long standing.  PT referral New PT location -  - [facility]  Review of Systems is noted in the HPI, as appropriate  Objective:   BP 100/62 (BP Location: Left Arm, Patient Position: Sitting, Cuff Size: Large)   Pulse 70   Temp 97.7 F (36.5 C) (Temporal)   Ht 5\' 10"  (1.778 m)   Wt 197 lb 2 oz (89.4 kg)   SpO2 96%   BMI 28.28 kg/m   GEN: No acute distress; alert,appropriate. PULM: Breathing comfortably in no respiratory distress PSYCH: Normally interactive.   Bilateral lower extremity sensory exam shows decreased sensation throughout much of the bilateral lower extremities as well as the entirety of the  feet bilaterally Strength is preserved Does have some tenderness in posterior paraspinous musculature and in the posterior pelvis He was examined in a seated position  Laboratory and Imaging Data:  Assessment and Plan:     ICD-10-CM   1. Chronic right-sided low back pain without sciatica  M54.50    G89.29     2. 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  Hopefully, he will have relief of symptoms after some prednisone and physical therapy.  His primary care doctor referred him to physical therapy few days ago.  He request that he have physical therapy at [facility].  Medication Management during today's office visit: Meds ordered this encounter  Medications   predniSONE (DELTASONE) 20 MG tablet    Sig: 2 tabs po daily for 5 days, then 1 tab po daily for 5 days    Dispense:  15 tablet    Refill:  0   There are no discontinued medications.  Orders placed today for conditions managed today: No orders of the defined types were placed in this encounter.   Disposition: No follow-ups on file.  Dragon [employer] One speech-to-text software was used for transcription  in this dictation.  Possible transcriptional errors can occur using Animal nutritionist.   Signed,  Elpidio Galea. Vishal Sandlin, MD   Outpatient Encounter Medications as of 03/04/2023  Medication Sig   acetaminophen (TYLENOL) 500 MG tablet Take 1,000 mg by mouth every 8 (eight) hours as needed for moderate pain.   apixaban (ELIQUIS) 5 MG TABS tablet TAKE 1 TABLET TWICE A DAY   ascorbic acid (VITAMIN C) 500 MG tablet Take 500 mg by mouth daily.   atorvastatin (LIPITOR) 20 MG tablet Take 1 tablet (20 mg total) by mouth daily. for cholesterol.   CALCIUM-VITAMIN D PO Take 600-800 mg by mouth daily.   EPINEPHRINE 0.3 mg/0.3 mL IJ  SOAJ injection INJECT 0.3 ML (0.3 MG TOTAL) INTO THE MUSCLE AS NEEDED FOR ANAPHYLAXIS   Magnesium Oxide 500 MG TABS Take 500 mg by mouth daily.   Melatonin 1 MG CAPS Take 1 mg by mouth at bedtime as needed (sleep).   memantine (NAMENDA) 5 MG tablet Take 5 mg by mouth 2 (two) times daily.   metoprolol succinate (TOPROL-XL) 50 MG 24 hr tablet Take 1 tablet (50 mg total) by mouth 2 (two) times daily.   Multiple Vitamin (MULTIVITAMIN) tablet Take 1 tablet by mouth daily.   Multiple Vitamins-Minerals (PRESERVISION AREDS 2 PO) Take 1 capsule by mouth 2 (two) times daily.    omeprazole (PRILOSEC) 20 MG capsule TAKE 1 CAPSULE (20 MG TOTAL) BY MOUTH 2 (TWO) TIMES DAILY BEFORE A MEAL. FOR HEARTBURN.   Polyethyl Glycol-Propyl Glycol (SYSTANE OP) Apply 1 drop to eye 3 (three) times daily as needed (dry eyes).   Potassium Gluconate 550 MG TABS Take 550 mg by mouth daily.   predniSONE (DELTASONE) 20 MG tablet 2 tabs po daily for 5 days, then 1 tab po daily for 5 days   Probiotic Product (PROBIOTIC DAILY PO) Take 1 capsule by mouth daily.    vitamin B-12 (CYANOCOBALAMIN) 1000 MCG tablet Take 1,000 mcg by mouth daily.   No facility-administered encounter medications on file as of 03/04/2023.

## 2023-03-11 DIAGNOSIS — G912 (Idiopathic) normal pressure hydrocephalus: Secondary | ICD-10-CM | POA: Diagnosis not present

## 2023-03-20 ENCOUNTER — Other Ambulatory Visit: Payer: Self-pay

## 2023-03-20 ENCOUNTER — Telehealth: Payer: Self-pay | Admitting: Cardiovascular Disease

## 2023-03-20 DIAGNOSIS — I480 Paroxysmal atrial fibrillation: Secondary | ICD-10-CM

## 2023-03-20 MED ORDER — APIXABAN 5 MG PO TABS: 5.0000 mg | ORAL_TABLET | Freq: Two times a day (BID) | ORAL | 1 refills | Status: DC

## 2023-03-20 NOTE — Telephone Encounter (Signed)
Prescription refill request for Eliquis received. Indication:afib Last office visit:10/24 Scr:0.81  5/24 Age: 82 Weight:89.4  kg  Prescription refilled

## 2023-03-20 NOTE — Telephone Encounter (Signed)
Please review

## 2023-03-20 NOTE — Telephone Encounter (Signed)
The patient's wife called questioning what the Atorvastatin was and its purpose. She has been educated on the reasoning for the patient taking the medication. She wants to know if this is absolutely necessary for an "82 year old" man.

## 2023-03-20 NOTE — Telephone Encounter (Signed)
Pt c/o medication issue:  1. Name of Medication: atorvastatin (LIPITOR) 20 MG tablet   2. How are you currently taking this medication (dosage and times per day)? Take 1 tablet (20 mg total) by mouth daily. for cholesterol.   3. Are you having a reaction (difficulty breathing--STAT)? No  4. What is your medication issue? Patient's wife would like to know if the patient is supposed to continue taking this medication. Please advise.

## 2023-03-20 NOTE — Telephone Encounter (Signed)
*  STAT* If patient is at the pharmacy, call can be transferred to refill team.   1. Which medications need to be refilled? (please list name of each medication and dose if known) apixaban (ELIQUIS) 5 MG TABS tablet    2. Would you like to learn more about the convenience, safety, & potential cost savings by using the [facility]? No   3. Are you open to using the [facility] No  4. Which pharmacy/location (including street and city if local pharmacy) is medication to be sent to? [id number] [address], [place] [phone]    5. Do they need a 30 day or 90 day supply? 90 Day Supply

## 2023-03-21 DIAGNOSIS — L820 Inflamed seborrheic keratosis: Secondary | ICD-10-CM | POA: Diagnosis not present

## 2023-03-21 DIAGNOSIS — D2262 Melanocytic nevi of left upper limb, including shoulder: Secondary | ICD-10-CM | POA: Diagnosis not present

## 2023-03-21 DIAGNOSIS — D225 Melanocytic nevi of trunk: Secondary | ICD-10-CM | POA: Diagnosis not present

## 2023-03-21 DIAGNOSIS — D2261 Melanocytic nevi of right upper limb, including shoulder: Secondary | ICD-10-CM | POA: Diagnosis not present

## 2023-03-21 DIAGNOSIS — L538 Other specified erythematous conditions: Secondary | ICD-10-CM | POA: Diagnosis not present

## 2023-03-21 DIAGNOSIS — Z85828 Personal history of other malignant neoplasm of skin: Secondary | ICD-10-CM | POA: Diagnosis not present

## 2023-03-21 DIAGNOSIS — L570 Actinic keratosis: Secondary | ICD-10-CM | POA: Diagnosis not present

## 2023-03-21 DIAGNOSIS — D2272 Melanocytic nevi of left lower limb, including hip: Secondary | ICD-10-CM | POA: Diagnosis not present

## 2023-03-21 DIAGNOSIS — X32XXXA Exposure to sunlight, initial encounter: Secondary | ICD-10-CM | POA: Diagnosis not present

## 2023-03-21 DIAGNOSIS — L2989 Other pruritus: Secondary | ICD-10-CM | POA: Diagnosis not present

## 2023-03-22 NOTE — Telephone Encounter (Signed)
Called patient and left a message for a call back.  

## 2023-03-26 ENCOUNTER — Other Ambulatory Visit: Payer: Self-pay | Admitting: Primary Care

## 2023-03-26 ENCOUNTER — Ambulatory Visit (INDEPENDENT_AMBULATORY_CARE_PROVIDER_SITE_OTHER): Payer: Medicare Other | Admitting: Primary Care

## 2023-03-26 ENCOUNTER — Encounter: Payer: Self-pay | Admitting: Primary Care

## 2023-03-26 VITALS — BP 144/80 | HR 65 | Temp 97.6°F | Ht 70.0 in | Wt 196.0 lb

## 2023-03-26 DIAGNOSIS — E782 Mixed hyperlipidemia: Secondary | ICD-10-CM | POA: Diagnosis not present

## 2023-03-26 DIAGNOSIS — K219 Gastro-esophageal reflux disease without esophagitis: Secondary | ICD-10-CM

## 2023-03-26 DIAGNOSIS — I700 Atherosclerosis of aorta: Secondary | ICD-10-CM

## 2023-03-26 DIAGNOSIS — I714 Abdominal aortic aneurysm, without rupture, unspecified: Secondary | ICD-10-CM

## 2023-03-26 DIAGNOSIS — R7303 Prediabetes: Secondary | ICD-10-CM | POA: Diagnosis not present

## 2023-03-26 DIAGNOSIS — Z125 Encounter for screening for malignant neoplasm of prostate: Secondary | ICD-10-CM | POA: Diagnosis not present

## 2023-03-26 DIAGNOSIS — R7989 Other specified abnormal findings of blood chemistry: Secondary | ICD-10-CM

## 2023-03-26 DIAGNOSIS — R4189 Other symptoms and signs involving cognitive functions and awareness: Secondary | ICD-10-CM | POA: Diagnosis not present

## 2023-03-26 DIAGNOSIS — G912 (Idiopathic) normal pressure hydrocephalus: Secondary | ICD-10-CM | POA: Diagnosis not present

## 2023-03-26 DIAGNOSIS — I480 Paroxysmal atrial fibrillation: Secondary | ICD-10-CM | POA: Diagnosis not present

## 2023-03-26 DIAGNOSIS — M545 Low back pain, unspecified: Secondary | ICD-10-CM | POA: Diagnosis not present

## 2023-03-26 DIAGNOSIS — G8929 Other chronic pain: Secondary | ICD-10-CM

## 2023-03-26 LAB — CBC
HCT: 43 % (ref 39.0–52.0)
Hemoglobin: 14.7 g/dL (ref 13.0–17.0)
MCHC: 34.1 g/dL (ref 30.0–36.0)
MCV: 99.5 fL (ref 78.0–100.0)
Platelets: 127 10*3/uL — ABNORMAL LOW (ref 150.0–400.0)
RBC: 4.32 Mil/uL (ref 4.22–5.81)
RDW: 13.6 % (ref 11.5–15.5)
WBC: 3.3 10*3/uL — ABNORMAL LOW (ref 4.0–10.5)

## 2023-03-26 LAB — COMPREHENSIVE METABOLIC PANEL
ALT: 13 U/L (ref 0–53)
AST: 16 U/L (ref 0–37)
Albumin: 4.4 g/dL (ref 3.5–5.2)
Alkaline Phosphatase: 70 U/L (ref 39–117)
BUN: 20 mg/dL (ref 6–23)
CO2: 26 meq/L (ref 19–32)
Calcium: 9.4 mg/dL (ref 8.4–10.5)
Chloride: 105 meq/L (ref 96–112)
Creatinine, Ser: 0.9 mg/dL (ref 0.40–1.50)
GFR: 80.27 mL/min (ref >60.00)
Glucose, Bld: 108 mg/dL — ABNORMAL HIGH (ref 70–99)
Potassium: 4.2 meq/L (ref 3.5–5.1)
Sodium: 141 meq/L (ref 135–145)
Total Bilirubin: 1 mg/dL (ref 0.2–1.2)
Total Protein: 6.7 g/dL (ref 6.0–8.3)

## 2023-03-26 LAB — PSA, MEDICARE: PSA: 7.84 ng/mL — ABNORMAL HIGH (ref 0.10–4.00)

## 2023-03-26 LAB — LIPID PANEL
Cholesterol: 160 mg/dL (ref 0–200)
HDL: 35.3 mg/dL — ABNORMAL LOW (ref >39.00)
LDL Cholesterol: 63 mg/dL (ref 0–99)
NonHDL: 124.36
Total CHOL/HDL Ratio: 5
Triglycerides: 305 mg/dL — ABNORMAL HIGH (ref 0.0–149.0)
VLDL: 61 mg/dL — ABNORMAL HIGH (ref 0.0–40.0)

## 2023-03-26 LAB — VITAMIN B12: Vitamin B-12: 831 pg/mL (ref 211–911)

## 2023-03-26 LAB — HEMOGLOBIN A1C: Hgb A1c MFr Bld: 6.1 % (ref 4.6–6.5)

## 2023-03-26 NOTE — Assessment & Plan Note (Signed)
Well-controlled.  Discussed a trial of reducing omeprazole to once daily. He will update.

## 2023-03-26 NOTE — Assessment & Plan Note (Signed)
Continue atorvastatin 20 mg daily. Lipid panel pending.

## 2023-03-26 NOTE — Assessment & Plan Note (Signed)
Chronic and continued.  Strongly advised that he increase his activity level during the day.  Referral from physical therapy printed, instructed patient to call to set up an appointment. Referral placed to orthopedics for evaluation of steroid spinal injection.

## 2023-03-26 NOTE — Assessment & Plan Note (Signed)
Following with neurology and vascular services. VP shunt in place.

## 2023-03-26 NOTE — Patient Instructions (Signed)
Stop by the lab prior to leaving today. I will notify you of your results once received.   Call physical therapy to schedule an appointment.  You will either be contacted via phone regarding your referral to orthopedics, or you may receive a letter on your MyChart portal from our referral team with instructions for scheduling an appointment. Please let us know if you have not been contacted by anyone within two weeks.  Reduce your omeprazole to once daily for heartburn.  Okay to increase back up to twice daily if needed.  It was a pleasure to see you today!

## 2023-03-26 NOTE — Assessment & Plan Note (Signed)
Following with neurology, office notes reviewed from October 2023 through Care Everywhere.  Continue Namenda 5 mg twice daily.  Offered to resume Lexapro, he currently declines.

## 2023-03-26 NOTE — Assessment & Plan Note (Signed)
Following with vascular surgery, office notes reviewed from August 2024.  Repeat duplex study around February 2025.

## 2023-03-26 NOTE — Progress Notes (Signed)
Subjective:    Patient ID: Adam Juarez, male    DOB: [birth year], 82 y.o.   MRN: [id number]  HPI  Adam Juarez is a very pleasant 82 y.o. male with a history of PAD, paroxysmal tachycardia, paroxysmal atrial fibrillation, chronic constipation, normal pressure hydrocephalus, osteoarthritis, chronic back pain, hyperlipidemia, chronic fatigue, elevated PSA, memory changes, chronic abdominal pain, prediabetes who presents today for follow-up of chronic conditions.  1) PAD/Hyperlipidemia/AAA/Paroxysmal Atrial Fibrillation: Currently managed on metoprolol succinate 50 mg daily, apixaban 5 mg twice daily, atorvastatin 20 mg daily.  Following with cardiology, last office visit was in October 2024, no changes made to his regimen.  Following with vascular services for monitoring of AAA, last office visit was in August 2024.  Duplex study during that visit revealed stable infrarenal abdominal aortic aneurysm of 4.3 cm.  He will repeat this again in late February 2025.  He denies chest pain, shortness of breath.   2) Cognitive Impairment/Normal Pressure Hydrocephalus: Following with neurology, last office visit was October 2023.  Currently managed on Namenda 5 mg twice daily.  Previously managed on Lexapro 10 mg daily.  He doesn't recall why he discontinued the Lexapro. He denies feeling overly anxious, worrying. He does get irritable at times at home, has to apologize to his wife on occasion.   He denies headaches, dizziness.   3) Chronic Back Pain: Chronic for years.  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  Evaluated by sports medicine a few weeks ago who prescribed prednisone and encouraged physical therapy.  He continues to spend most of his day in his recliner watching TV. He feels better since taking prednisone, wants to take permanently but understands that he cannot do so. He takes 650 mg of Tylenol three times daily. He's never undergone steroid injections  to his back.   He has never seen orthopedics for steroid injections. He has yet to schedule an appointment with physical therapy.    Review of Systems  Eyes:  Negative for visual disturbance.  Respiratory:  Negative for shortness of breath.   Cardiovascular:  Negative for chest pain.  Neurological:  Negative for dizziness and headaches.  Psychiatric/Behavioral:  The patient is not nervous/anxious.          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 a-fib x 1 episode patient states he "auto corrected" seen at [facility]   Foul smelling urine 06/22/2022   GERD (gastroesophageal reflux disease)    Headache    poor posture - none recently   History of kidney stones    Hydrocephalus (HCC)    Hyperlipidemia    Neuropathy    PSVT (paroxysmal supraventricular tachycardia) (HCC) 2009   Controlled with "breathing process"   Renal stone 9/08, 6/15   Wears dentures    full upper    Social History   Socioeconomic History   Marital status: Married    Spouse name: Not on file   Number of children: Not on file   Years of education: Not on file   Highest education level: Not on file  Occupational History   Not on file  Tobacco Use   Smoking status: Former    Current packs/day: 0.00    Average packs/day: 0.5 packs/day for 38.0 years (19.0 ttl pk-yrs)    Types: Cigarettes  Start date: 07/14/1959    Quit date: 07/13/1997    Years since quitting: 25.7   Smokeless tobacco: Never  Vaping Use   Vaping status: Never Used  Substance and Sexual Activity   Alcohol use: No   Drug use: No   Sexual activity: Not on file  Other Topics Concern   Not on file  Social History Narrative   Not on file   Social Determinants of Health   Financial Resource Strain: Low Risk  (05/25/2022)   Overall Financial Resource Strain (CARDIA)    Difficulty of Paying  Living Expenses: Not hard at all  Food Insecurity: No Food Insecurity (05/25/2022)   Hunger Vital Sign    Worried About Running Out of Food in the Last Year: Never true    Ran Out of Food in the Last Year: Never true  Transportation Needs: No Transportation Needs (05/25/2022)   PRAPARE - Administrator, Civil Service (Medical): No    Lack of Transportation (Non-Medical): No  Physical Activity: Inactive (05/25/2022)   Exercise Vital Sign    Days of Exercise per Week: 0 days    Minutes of Exercise per Session: 0 min  Stress: No Stress Concern Present (05/25/2022)   [employer] of Occupational Health - Occupational Stress Questionnaire    Feeling of Stress : Not at all  Social Connections: Socially Integrated (05/25/2022)   Social Connection and Isolation Panel [NHANES]    Frequency of Communication with Friends and Family: Twice a week    Frequency of Social Gatherings with Friends and Family: Twice a week    Attends Religious Services: More than 4 times per year    Active Member of [employer] or Organizations: Yes    Attends Banker Meetings: More than 4 times per year    Marital Status: Married  Catering manager Violence: Not At Risk (05/25/2022)   Humiliation, Afraid, Rape, and Kick questionnaire    Fear of Current or Ex-Partner: No    Emotionally Abused: No    Physically Abused: No    Sexually Abused: No    Past Surgical History:  Procedure Laterality Date   CARDIAC CATHETERIZATION  2008   [place], [place]   CATARACT EXTRACTION W/PHACO Left 07/27/2015   Procedure: CATARACT EXTRACTION PHACO AND INTRAOCULAR LENS PLACEMENT (IOC);  Surgeon: Lockie Mola, MD;  Location: [facility];  Service: Ophthalmology;  Laterality: Left;  TORIC   CATARACT EXTRACTION W/PHACO Right 08/24/2015   Procedure: CATARACT EXTRACTION PHACO AND INTRAOCULAR LENS PLACEMENT (IOC);  Surgeon: Lockie Mola, MD;  Location: [facility];  Service: Ophthalmology;   Laterality: Right;  TORIC   COLONOSCOPY WITH PROPOFOL N/A 08/10/2020   Procedure: COLONOSCOPY WITH PROPOFOL;  Surgeon: Toledo, Boykin Nearing, MD;  Location: [facility];  Service: Gastroenterology;  Laterality: N/A;   HEMORROIDECTOMY  2010   banding   IR ANGIO INTRA EXTRACRAN SEL COM CAROTID INNOMINATE BILAT MOD SED  10/30/2019   IR ANGIO VERTEBRAL SEL VERTEBRAL UNI R MOD SED  10/30/2019   IR RADIOLOGIST EVAL & MGMT  04/11/2020   IR US GUIDE VASC ACCESS RIGHT  10/30/2019   KIDNEY STONE SURGERY  9/08, 6/15   lithotripsy   LAPAROSCOPIC REVISION VENTRICULAR-PERITONEAL (V-P) SHUNT  01/08/2020   Procedure: LAPAROSCOPIC REVISION VENTRICULAR-PERITONEAL (V-P) SHUNT;  Surgeon: Lisbeth Renshaw, MD;  Location: [facility];  Service: Neurosurgery;;   PROSTATE BIOPSY  2007   SKIN CANCER EXCISION     TOENAIL TRIMMING  11/2017   Dr. Orland Jarred  TONSILLECTOMY     VASECTOMY     VENTRICULOPERITONEAL SHUNT Right 01/08/2020   Procedure: SHUNT INSERTION VENTRICULAR-PERITONEAL;  Surgeon: Lisbeth Renshaw, MD;  Location: [facility];  Service: Neurosurgery;  Laterality: Right;    Family History  Problem Relation Age of Onset   AAA (abdominal aortic aneurysm) Father    Parkinson's disease Mother    Arthritis Mother     Allergies  Allergen Reactions   Bee Venom Anaphylaxis and Hives   Other Other (See Comments)    Pistachios - tickles throat  Pistachios - tickles throat   Tramadol Other (See Comments)    Current Outpatient Medications on File Prior to Visit  Medication Sig Dispense Refill   acetaminophen (TYLENOL) 500 MG tablet Take 1,000 mg by mouth every 8 (eight) hours as needed for moderate pain.     apixaban (ELIQUIS) 5 MG TABS tablet Take 1 tablet (5 mg total) by mouth 2 (two) times daily. 180 tablet 1   ascorbic acid (VITAMIN C) 500 MG tablet Take 500 mg by mouth daily.     atorvastatin (LIPITOR) 20 MG tablet Take 1 tablet (20 mg total) by mouth daily. for cholesterol. 90 tablet 3   CALCIUM-VITAMIN D PO  Take 600-800 mg by mouth daily.     EPINEPHRINE 0.3 mg/0.3 mL IJ SOAJ injection INJECT 0.3 ML (0.3 MG TOTAL) INTO THE MUSCLE AS NEEDED FOR ANAPHYLAXIS 2 each 1   Magnesium Oxide 500 MG TABS Take 500 mg by mouth daily.     Melatonin 1 MG CAPS Take 1 mg by mouth at bedtime as needed (sleep).     memantine (NAMENDA) 5 MG tablet Take 5 mg by mouth 2 (two) times daily.     metoprolol succinate (TOPROL-XL) 50 MG 24 hr tablet Take 1 tablet (50 mg total) by mouth 2 (two) times daily. 180 tablet 3   Multiple Vitamin (MULTIVITAMIN) tablet Take 1 tablet by mouth daily.     Multiple Vitamins-Minerals (PRESERVISION AREDS 2 PO) Take 1 capsule by mouth 2 (two) times daily.      omeprazole (PRILOSEC) 20 MG capsule TAKE 1 CAPSULE (20 MG TOTAL) BY MOUTH 2 (TWO) TIMES DAILY BEFORE A MEAL. FOR HEARTBURN. 180 capsule 0   Polyethyl Glycol-Propyl Glycol (SYSTANE OP) Apply 1 drop to eye 3 (three) times daily as needed (dry eyes).     Potassium Gluconate 550 MG TABS Take 550 mg by mouth daily.     Probiotic Product (PROBIOTIC DAILY PO) Take 1 capsule by mouth daily.      vitamin B-12 (CYANOCOBALAMIN) 1000 MCG tablet Take 1,000 mcg by mouth daily.     No current facility-administered medications on file prior to visit.    BP (!) 144/80   Pulse 65   Temp 97.6 F (36.4 C) (Temporal)   Ht 5\' 10"  (1.778 m)   Wt 196 lb (88.9 kg)   SpO2 97%   BMI 28.12 kg/m  Objective:   Physical Exam Cardiovascular:     Rate and Rhythm: Normal rate and regular rhythm.  Pulmonary:     Effort: Pulmonary effort is normal.     Breath sounds: Normal breath sounds.  Abdominal:     Palpations: Abdomen is soft.     Tenderness: There is no abdominal tenderness.  Musculoskeletal:     Cervical back: Neck supple.  Skin:    General: Skin is warm and dry.  Neurological:     Mental Status: He is alert and oriented to person, place, and time.  Psychiatric:  Mood and Affect: Mood normal.           Assessment & Plan:   Abdominal aortic aneurysm (AAA) without rupture, unspecified part [facility]) Assessment & Plan: Following with vascular surgery, office notes reviewed from August 2024.  Repeat duplex study around February 2025.   Chronic right-sided low back pain without sciatica Assessment & Plan: Chronic and continued.  Strongly advised that he increase his activity level during the day.  Referral from physical therapy printed, instructed patient to call to set up an appointment. Referral placed to orthopedics for evaluation of steroid spinal injection.    Orders: -     Ambulatory referral to Orthopedic Surgery  Aortic atherosclerosis (HCC) Assessment & Plan: Continue atorvastatin 20 mg daily. Repeat lipid panel pending.   Paroxysmal atrial fibrillation (HCC) Assessment & Plan: Rate and rhythm regular today.  Following with cardiology, office notes reviewed from October 2024.  Continue metoprolol succinate 50 mg daily for rate control and apixaban 5 mg twice daily for anticoagulation.   Gastroesophageal reflux disease, unspecified whether esophagitis present Assessment & Plan: Well-controlled.  Discussed a trial of reducing omeprazole to once daily. He will update.   Normal pressure hydrocephalus (HCC) Assessment & Plan: Following with neurology and vascular services. VP shunt in place.   Cognitive impairment Assessment & Plan: Following with neurology, office notes reviewed from October 2023 through [facility].  Continue Namenda 5 mg twice daily.  Offered to resume Lexapro, he currently declines.  Orders: -     Vitamin B12  Mixed hyperlipidemia Assessment & Plan: Continue atorvastatin 20 mg daily. Lipid panel pending.  Orders: -     Lipid panel -     Comprehensive metabolic panel  Prediabetes Assessment & Plan: Repeat A1c pending.  Recommended to increase physical activity during the day.  Orders: -     Hemoglobin A1c -     CBC  Screening for  prostate cancer -     PSA, Medicare        [name], NP

## 2023-03-26 NOTE — Assessment & Plan Note (Signed)
Rate and rhythm regular today.  Following with cardiology, office notes reviewed from October 2024.  Continue metoprolol succinate 50 mg daily for rate control and apixaban 5 mg twice daily for anticoagulation.

## 2023-03-26 NOTE — Assessment & Plan Note (Signed)
Continue atorvastatin 20 mg daily. Repeat lipid panel pending.

## 2023-03-26 NOTE — Assessment & Plan Note (Signed)
Repeat A1c pending.  Recommended to increase physical activity during the day.

## 2023-03-27 ENCOUNTER — Ambulatory Visit: Payer: Medicare Other

## 2023-03-27 DIAGNOSIS — R7989 Other specified abnormal findings of blood chemistry: Secondary | ICD-10-CM

## 2023-03-28 LAB — PATHOLOGIST SMEAR REVIEW

## 2023-03-29 ENCOUNTER — Encounter: Payer: Self-pay | Admitting: *Deleted

## 2023-03-29 NOTE — Telephone Encounter (Signed)
Called and spoke with patient. Notified him of the following from Dr. Mariah Milling.  Yes I would continue Lipitor 20 Helps slow any progression of aortic aneurysm, plaque in the aorta, can decrease risk of heart attack and stroke Thx TGollan    Patient verbalizes understanding.

## 2023-04-01 ENCOUNTER — Other Ambulatory Visit: Payer: Self-pay | Admitting: Primary Care

## 2023-04-01 DIAGNOSIS — R972 Elevated prostate specific antigen [PSA]: Secondary | ICD-10-CM

## 2023-04-01 DIAGNOSIS — R7989 Other specified abnormal findings of blood chemistry: Secondary | ICD-10-CM

## 2023-04-02 DIAGNOSIS — M533 Sacrococcygeal disorders, not elsewhere classified: Secondary | ICD-10-CM | POA: Diagnosis not present

## 2023-04-19 DIAGNOSIS — M533 Sacrococcygeal disorders, not elsewhere classified: Secondary | ICD-10-CM | POA: Diagnosis not present

## 2023-04-23 ENCOUNTER — Other Ambulatory Visit: Payer: Self-pay

## 2023-04-23 DIAGNOSIS — I480 Paroxysmal atrial fibrillation: Secondary | ICD-10-CM

## 2023-04-23 MED ORDER — APIXABAN 5 MG PO TABS: 5.0000 mg | ORAL_TABLET | Freq: Two times a day (BID) | ORAL | 1 refills | Status: DC

## 2023-04-29 DIAGNOSIS — M79675 Pain in left toe(s): Secondary | ICD-10-CM | POA: Diagnosis not present

## 2023-04-29 DIAGNOSIS — B351 Tinea unguium: Secondary | ICD-10-CM | POA: Diagnosis not present

## 2023-04-29 DIAGNOSIS — L851 Acquired keratosis [keratoderma] palmaris et plantaris: Secondary | ICD-10-CM | POA: Diagnosis not present

## 2023-04-29 DIAGNOSIS — M79674 Pain in right toe(s): Secondary | ICD-10-CM | POA: Diagnosis not present

## 2023-05-01 ENCOUNTER — Other Ambulatory Visit (INDEPENDENT_AMBULATORY_CARE_PROVIDER_SITE_OTHER): Payer: Medicare Other

## 2023-05-01 DIAGNOSIS — R7989 Other specified abnormal findings of blood chemistry: Secondary | ICD-10-CM | POA: Diagnosis not present

## 2023-05-01 LAB — CBC WITH DIFFERENTIAL/PLATELET
Basophils Absolute: 0 10*3/uL (ref 0.0–0.1)
Basophils Relative: 0.9 % (ref 0.0–3.0)
Eosinophils Absolute: 0.1 10*3/uL (ref 0.0–0.7)
Eosinophils Relative: 1.4 % (ref 0.0–5.0)
HCT: 44.5 % (ref 39.0–52.0)
Hemoglobin: 15.1 g/dL (ref 13.0–17.0)
Lymphocytes Relative: 17.2 % (ref 12.0–46.0)
Lymphs Abs: 0.9 10*3/uL (ref 0.7–4.0)
MCHC: 34 g/dL (ref 30.0–36.0)
MCV: 99.7 fL (ref 78.0–100.0)
Monocytes Absolute: 0.8 10*3/uL (ref 0.1–1.0)
Monocytes Relative: 15.5 % — ABNORMAL HIGH (ref 3.0–12.0)
Neutro Abs: 3.5 10*3/uL (ref 1.4–7.7)
Neutrophils Relative %: 65 % (ref 43.0–77.0)
Platelets: 150 10*3/uL (ref 150.0–400.0)
RBC: 4.46 Mil/uL (ref 4.22–5.81)
RDW: 13.9 % (ref 11.5–15.5)
WBC: 5.4 10*3/uL (ref 4.0–10.5)

## 2023-05-03 DIAGNOSIS — M545 Low back pain, unspecified: Secondary | ICD-10-CM | POA: Diagnosis not present

## 2023-05-11 ENCOUNTER — Other Ambulatory Visit: Payer: Self-pay | Admitting: Primary Care

## 2023-05-11 DIAGNOSIS — K219 Gastro-esophageal reflux disease without esophagitis: Secondary | ICD-10-CM

## 2023-05-13 DIAGNOSIS — H353112 Nonexudative age-related macular degeneration, right eye, intermediate dry stage: Secondary | ICD-10-CM | POA: Diagnosis not present

## 2023-05-13 DIAGNOSIS — Z961 Presence of intraocular lens: Secondary | ICD-10-CM | POA: Diagnosis not present

## 2023-05-13 DIAGNOSIS — H353124 Nonexudative age-related macular degeneration, left eye, advanced atrophic with subfoveal involvement: Secondary | ICD-10-CM | POA: Diagnosis not present

## 2023-05-24 ENCOUNTER — Other Ambulatory Visit: Payer: Self-pay

## 2023-05-24 DIAGNOSIS — I480 Paroxysmal atrial fibrillation: Secondary | ICD-10-CM

## 2023-05-24 MED ORDER — APIXABAN 5 MG PO TABS: 5.0000 mg | ORAL_TABLET | Freq: Two times a day (BID) | ORAL | 1 refills | Status: DC

## 2023-05-24 NOTE — Telephone Encounter (Signed)
 Prescription refill request for Eliquis received. Indication:afib Last office visit:10/24 Scr:0.90  11/24 Age: 83 Weight:88.9  kg  Prescription refilled

## 2023-05-27 ENCOUNTER — Encounter: Payer: Self-pay | Admitting: *Deleted

## 2023-05-29 ENCOUNTER — Ambulatory Visit: Payer: Medicare Other | Admitting: Urology

## 2023-05-29 VITALS — BP 126/81 | HR 76 | Ht 70.0 in | Wt 190.0 lb

## 2023-05-29 DIAGNOSIS — R972 Elevated prostate specific antigen [PSA]: Secondary | ICD-10-CM

## 2023-05-29 NOTE — Progress Notes (Signed)
I, Maysun Anabel Bene, acting as a scribe for Riki Altes, MD., have documented all relevant documentation on the behalf of Riki Altes, MD, as directed by Riki Altes, MD while in the presence of Riki Altes, MD.  05/29/2023 8:04 PM   [name] [birth year] [phone]  Referring provider: [name], NP [address] [place],  [place] [address]  Chief Complaint  Patient presents with   Elevated PSA    HPI: Adam Juarez is a 83 y.o. male referred for evaluation of an elevated PSA. He presents today with his wife.   PSA 03/26/23 was 7.84. Prior PSA was 4.63 07/22/2020. It was 6.54 April 2019 and repeated September 2019 and was 5.26 He has mild lower urinary tract symptoms, which are not bothersome. He is on chronic anticoagulant therapy with Eliquis for atrial fibrillation. He had a prostate biopsy while living in [place] in 2007, which was benign. He does not remember his PSA level when this biopsy was performed. Prior history of stone disease and has had ESL x2, last procedure June 2015.   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 a-fib x 1 episode patient states he "auto corrected" seen at [facility]   Foul smelling urine 06/22/2022   GERD (gastroesophageal reflux disease)    Headache    poor posture - none recently   History of kidney stones    Hydrocephalus (HCC)    Hyperlipidemia    Neuropathy    PSVT (paroxysmal supraventricular tachycardia) (HCC) 2009   Controlled with "breathing process"   Renal stone 9/08, 6/15   Wears dentures    full upper    Surgical History: Past Surgical History:  Procedure Laterality Date   CARDIAC CATHETERIZATION  2008   [place], [place]   CATARACT EXTRACTION [facility] Left 07/27/2015   Procedure: CATARACT EXTRACTION PHACO AND INTRAOCULAR LENS PLACEMENT (IOC);  Surgeon:  Lockie Mola, MD;  Location: [facility];  Service: Ophthalmology;  Laterality: Left;  TORIC   CATARACT EXTRACTION W/PHACO Right 08/24/2015   Procedure: CATARACT EXTRACTION PHACO AND INTRAOCULAR LENS PLACEMENT (IOC);  Surgeon: Lockie Mola, MD;  Location: [facility];  Service: Ophthalmology;  Laterality: Right;  TORIC   COLONOSCOPY WITH PROPOFOL N/A 08/10/2020   Procedure: COLONOSCOPY WITH PROPOFOL;  Surgeon: Toledo, Boykin Nearing, MD;  Location: [facility];  Service: Gastroenterology;  Laterality: N/A;   HEMORROIDECTOMY  2010   banding   IR ANGIO INTRA EXTRACRAN SEL COM CAROTID INNOMINATE BILAT MOD SED  10/30/2019   IR ANGIO VERTEBRAL SEL VERTEBRAL UNI R MOD SED  10/30/2019   IR RADIOLOGIST EVAL & MGMT  04/11/2020   IR US GUIDE VASC ACCESS RIGHT  10/30/2019   KIDNEY STONE SURGERY  9/08, 6/15   lithotripsy   LAPAROSCOPIC REVISION VENTRICULAR-PERITONEAL (V-P) SHUNT  01/08/2020   Procedure: LAPAROSCOPIC REVISION VENTRICULAR-PERITONEAL (V-P) SHUNT;  Surgeon: Lisbeth Renshaw, MD;  Location: [facility];  Service: Neurosurgery;;   PROSTATE BIOPSY  2007   SKIN CANCER EXCISION     TOENAIL TRIMMING  11/2017   Dr. Orland Jarred   TONSILLECTOMY     VASECTOMY     VENTRICULOPERITONEAL SHUNT Right 01/08/2020   Procedure: SHUNT INSERTION VENTRICULAR-PERITONEAL;  Surgeon: Lisbeth Renshaw, MD;  Location: [facility];  Service: Neurosurgery;  Laterality: Right;  Home Medications:  Allergies as of 05/29/2023       Reactions   Bee Venom Anaphylaxis, Hives   Other Other (See Comments)   Pistachios - tickles throat Pistachios - tickles throat   Tramadol Other (See Comments)        Medication List        Accurate as of May 29, 2023  8:04 PM. If you have any questions, ask your nurse or doctor.          STOP taking these medications    apixaban 5 MG Tabs tablet Commonly known as: Eliquis   atorvastatin 20 MG tablet Commonly known as: LIPITOR   EPINEPHrine 0.3 mg/0.3  mL Soaj injection Commonly known as: EPI-PEN   metoprolol succinate 50 MG 24 hr tablet Commonly known as: TOPROL-XL       TAKE these medications    acetaminophen 500 MG tablet Commonly known as: TYLENOL Take 1,000 mg by mouth every 8 (eight) hours as needed for moderate pain.   ascorbic acid 500 MG tablet Commonly known as: VITAMIN C Take 500 mg by mouth daily.   CALCIUM-VITAMIN D PO Take 600-800 mg by mouth daily.   cyanocobalamin 1000 MCG tablet Commonly known as: VITAMIN B12 Take 1,000 mcg by mouth daily.   Magnesium Oxide -Mg Supplement 500 MG Tabs Take 500 mg by mouth daily.   Melatonin 1 MG Caps Take 1 mg by mouth at bedtime as needed (sleep).   memantine 5 MG tablet Commonly known as: NAMENDA Take 5 mg by mouth 2 (two) times daily.   multivitamin tablet Take 1 tablet by mouth daily.   omeprazole 20 MG capsule Commonly known as: PRILOSEC TAKE 1 CAPSULE (20 MG TOTAL) BY MOUTH 2 (TWO) TIMES DAILY BEFORE A MEAL. FOR HEARTBURN.   Potassium Gluconate 550 MG Tabs Take 550 mg by mouth daily.   PRESERVISION AREDS 2 PO Take 1 capsule by mouth 2 (two) times daily.   PROBIOTIC DAILY PO Take 1 capsule by mouth daily.   SYSTANE OP Apply 1 drop to eye 3 (three) times daily as needed (dry eyes).        Allergies:  Allergies  Allergen Reactions   Bee Venom Anaphylaxis and Hives   Other Other (See Comments)    Pistachios - tickles throat  Pistachios - tickles throat   Tramadol Other (See Comments)    Family History: Family History  Problem Relation Age of Onset   AAA (abdominal aortic aneurysm) Father    Parkinson's disease Mother    Arthritis Mother     Social History:  reports that he quit smoking about 25 years ago. His smoking use included cigarettes. He started smoking about 63 years ago. He has a 19 pack-year smoking history. He has never used smokeless tobacco. He reports that he does not drink alcohol and does not use drugs.   Physical  Exam: BP 126/81   Pulse 76   Ht 5\' 10"  (1.778 m)   Wt 190 lb (86.2 kg)   BMI 27.26 kg/m   Constitutional:  Alert and oriented, No acute distress. HEENT: [place] AT Respiratory: Normal respiratory effort, no increased work of breathing. GU: Prostate 60 grams, smooth without nodules.  Psychiatric: Normal mood and affect.   Assessment & Plan:    1. Elevated PSA We discussed based on current guidelines, it is recommended prostate cancer screening be discontinued between age 46-75. His most recent PSA is slightly above age-specific guidelines, (6.5) and his DRE is benign. We discussed  options of repeat prostate biopsy, prostate MRI and repeating his PSA as the bump over baseline could be secondary to transient inflammation. He has elected to have his PSA repeated May 2025. If his PSA returns to baseline, would recommend discontinuing prostate cancer screening based on current guidelines.  I have reviewed the above documentation for accuracy and completeness, and I agree with the above.   Riki Altes, MD  [facility] [address], [address] [place], [place] [address] [phone]

## 2023-05-30 ENCOUNTER — Encounter: Payer: Self-pay | Admitting: Urology

## 2023-05-31 ENCOUNTER — Telehealth: Payer: Self-pay | Admitting: Primary Care

## 2023-05-31 ENCOUNTER — Ambulatory Visit (INDEPENDENT_AMBULATORY_CARE_PROVIDER_SITE_OTHER): Payer: Medicare Other | Admitting: Nurse Practitioner

## 2023-05-31 ENCOUNTER — Encounter: Payer: Self-pay | Admitting: Nurse Practitioner

## 2023-05-31 VITALS — BP 112/70 | HR 65 | Temp 97.9°F | Ht 70.0 in | Wt 196.0 lb

## 2023-05-31 DIAGNOSIS — R1031 Right lower quadrant pain: Secondary | ICD-10-CM | POA: Diagnosis not present

## 2023-05-31 LAB — CBC
HCT: 43.8 % (ref 39.0–52.0)
Hemoglobin: 14.9 g/dL (ref 13.0–17.0)
MCHC: 34 g/dL (ref 30.0–36.0)
MCV: 99.1 fL (ref 78.0–100.0)
Platelets: 150 10*3/uL (ref 150.0–400.0)
RBC: 4.42 Mil/uL (ref 4.22–5.81)
RDW: 13.4 % (ref 11.5–15.5)
WBC: 4.9 10*3/uL (ref 4.0–10.5)

## 2023-05-31 LAB — COMPREHENSIVE METABOLIC PANEL
ALT: 14 U/L (ref 0–53)
AST: 18 U/L (ref 0–37)
Albumin: 4.4 g/dL (ref 3.5–5.2)
Alkaline Phosphatase: 78 U/L (ref 39–117)
BUN: 16 mg/dL (ref 6–23)
CO2: 28 meq/L (ref 19–32)
Calcium: 9.7 mg/dL (ref 8.4–10.5)
Chloride: 103 meq/L (ref 96–112)
Creatinine, Ser: 0.83 mg/dL (ref 0.40–1.50)
GFR: 82.15 mL/min (ref >60.00)
Glucose, Bld: 91 mg/dL (ref 70–99)
Potassium: 4.5 meq/L (ref 3.5–5.1)
Sodium: 139 meq/L (ref 135–145)
Total Bilirubin: 1.2 mg/dL (ref 0.2–1.2)
Total Protein: 7.4 g/dL (ref 6.0–8.3)

## 2023-05-31 LAB — POCT URINALYSIS DIPSTICK
Bilirubin, UA: NEGATIVE
Blood, UA: NEGATIVE
Glucose, UA: NEGATIVE
Ketones, UA: NEGATIVE
Leukocytes, UA: NEGATIVE
Nitrite, UA: NEGATIVE
Protein, UA: NEGATIVE
Spec Grav, UA: 1.02 (ref 1.010–1.025)
Urobilinogen, UA: 0.2 U/dL
pH, UA: 6 (ref 5.0–8.0)

## 2023-05-31 MED ORDER — AMOXICILLIN-POT CLAVULANATE 875-125 MG PO TABS: 1.0000 | ORAL_TABLET | Freq: Two times a day (BID) | ORAL | 0 refills | Status: AC

## 2023-05-31 NOTE — Patient Instructions (Addendum)
Nice to see you today I have sent in antibiotics to the pharmacy  Follow up if you do not improve

## 2023-05-31 NOTE — Assessment & Plan Note (Addendum)
Patient has a history of the same.  Abdominal exam benign no rebound tenderness or concern for appendicitis.  With history of diverticulitis treat for same.  Will do Augmentin 875-125 mg twice daily for 7 days.  Patient to start with clear liquid diet with up to brat diet and advance as tolerated.  Strict signs and symptoms reviewed as when to be reevaluated.  UA was negative in office

## 2023-05-31 NOTE — Telephone Encounter (Signed)
Called and scheduled patient appt for evaluation with Audria Nine, NP today at 12:00pm.

## 2023-05-31 NOTE — Telephone Encounter (Signed)
Copied from CRM [id number]. Topic: Clinical - Medication Question >> May 31, 2023  9:50 AM Adam Juarez wrote: Reason for CRM: Patient called in wanting to know if he can have a refill on his antibiotics due to having an Diverticulitis episode, patient could not remember name of medication / If needed, please call [phone]

## 2023-05-31 NOTE — Progress Notes (Signed)
Established Patient Office Visit  Subjective   Patient ID: Adam Juarez, male    DOB: [birth year]  Age: 83 y.o. MRN: [id number]  Chief Complaint  Patient presents with   Diverticulitis    Pt complains of pain in lower abdomen on right side. Pt requests antibiotic. States that he is going out of town.     HPI  Abdominal pain:  states that it started Monday and got worse. States that it was intermittent and a sharp. States that food and fluid does not make it wrose. He does not drink alcohol use  Last BM today that was normal per his report.  Patient has a history of diverticulitis.  States it is present this way normally.  He has been treated by antibiotics in the past with good result.    Review of Systems  Constitutional:  Negative for chills and fever.  Respiratory:  Negative for shortness of breath.   Cardiovascular:  Negative for chest pain.  Gastrointestinal:  Positive for abdominal pain. Negative for nausea and vomiting.  Genitourinary:  Negative for dysuria, frequency and hematuria.  Neurological:  Negative for dizziness and headaches.      Objective:     BP 112/70   Pulse 65   Temp 97.9 F (36.6 C) (Oral)   Ht 5\' 10"  (1.778 m)   Wt 196 lb (88.9 kg)   SpO2 98%   BMI 28.12 kg/m    Physical Exam Vitals and nursing note reviewed.  Constitutional:      Appearance: Normal appearance.  Cardiovascular:     Rate and Rhythm: Normal rate and regular rhythm.     Heart sounds: Normal heart sounds.  Pulmonary:     Effort: Pulmonary effort is normal.     Breath sounds: Normal breath sounds.  Abdominal:     General: Bowel sounds are normal. There is no distension.     Palpations: There is no mass.     Tenderness: There is abdominal tenderness in the right lower quadrant. There is no guarding or rebound. Negative signs include McBurney's sign.     Hernia: A hernia (umbilical) is present.  Neurological:     Mental Status: He is alert.      Results for orders  placed or performed in visit on 05/31/23  POCT urinalysis dipstick  Result Value Ref Range   Color, UA yellow    Clarity, UA clear    Glucose, UA Negative Negative   Bilirubin, UA neg    Ketones, UA neg    Spec Grav, UA 1.020 1.010 - 1.025   Blood, UA neg    pH, UA 6.0 5.0 - 8.0   Protein, UA Negative Negative   Urobilinogen, UA 0.2 0.2 or 1.0 E.U./dL   Nitrite, UA neg    Leukocytes, UA Negative Negative   Appearance     Odor        The ASCVD Risk score (Arnett DK, et al., 2019) failed to calculate for the following reasons:   The 2019 ASCVD risk score is only valid for ages 63 to 78    Assessment & Plan:   Problem List Items Addressed This Visit       Other   RLQ abdominal pain - Primary   Patient has a history of the same.  Abdominal exam benign no rebound tenderness or concern for appendicitis.  With history of diverticulitis treat for same.  Will do Augmentin 875-125 mg twice daily for 7 days.  Patient to start with  clear liquid diet with up to brat diet and advance as tolerated.  Strict signs and symptoms reviewed as when to be reevaluated.  UA was negative in office      Relevant Medications   amoxicillin-clavulanate (AUGMENTIN) 875-125 MG tablet   Other Relevant Orders   CBC   Comprehensive metabolic panel   POCT urinalysis dipstick (Completed)    Return if symptoms worsen or fail to improve.    Audria Nine, NP

## 2023-06-03 ENCOUNTER — Telehealth: Payer: Self-pay | Admitting: Nurse Practitioner

## 2023-06-03 NOTE — Telephone Encounter (Signed)
Can we call the patient and see how his abdominal pain is doing while being on the antibiotics.

## 2023-06-03 NOTE — Telephone Encounter (Signed)
-----   Message from [facility] sent at 05/31/2023 12:18 PM EST ----- Regarding: Diverticulitis Call and see how stomach pain is with antibiotics

## 2023-06-04 ENCOUNTER — Encounter: Payer: Self-pay | Admitting: Nurse Practitioner

## 2023-06-04 NOTE — Telephone Encounter (Signed)
Contacted pt regarding abdominal pain. Complains of slowly going back to normal, pt has changed his diet, has been taking antibiotics as prescribed and states he has been doing well.

## 2023-06-13 ENCOUNTER — Ambulatory Visit (INDEPENDENT_AMBULATORY_CARE_PROVIDER_SITE_OTHER): Payer: Medicare Other

## 2023-06-13 VITALS — Ht 70.0 in | Wt 192.0 lb

## 2023-06-13 DIAGNOSIS — Z0000 Encounter for general adult medical examination without abnormal findings: Secondary | ICD-10-CM

## 2023-06-13 NOTE — Progress Notes (Signed)
Subjective:   Adam Juarez is a 83 y.o. male who presents for Medicare Annual/Subsequent preventive examination.  Visit Complete: Virtual I connected with  [name] on 06/13/23 by a audio enabled telemedicine application and verified that I am speaking with the correct person using two identifiers.  Patient Location: Home  Provider Location: Office/Clinic  I discussed the limitations of evaluation and management by telemedicine. The patient expressed understanding and agreed to proceed.  Vital Signs: Because this visit was a virtual/telehealth visit, some criteria may be missing or patient reported. Any vitals not documented were not able to be obtained and vitals that have been documented are patient reported.  Patient Medicare AWV questionnaire was completed by the patient on (not done); I have confirmed that all information answered by patient is correct and no changes since this date.  Cardiac Risk Factors include: advanced age (>10men, >86 women);sedentary lifestyle;male gender;dyslipidemia    Objective:    Today's Vitals   06/13/23 1346 06/13/23 1347  Weight: 192 lb (87.1 kg)   Height: 5\' 10"  (1.778 m)   PainSc:  2    Body mass index is 27.55 kg/m.     06/13/2023    2:05 PM 12/27/2022    3:28 PM 05/25/2022   11:19 AM 07/12/2021    9:48 AM 05/24/2021   11:21 AM 08/10/2020   10:51 AM 01/05/2020    8:58 AM  Advanced Directives  Does Patient Have a Medical Advance Directive? Yes Yes Yes Yes Yes Yes Yes  Type of Estate agent of [place];Living will Living will Healthcare Power of [place];Living will Healthcare Power of [place];Living will Healthcare Power of [place];Living will Healthcare Power of [place];Living will Healthcare Power of [place];Living will  Does patient want to make changes to medical advance directive?  No - Patient declined No - Patient declined No - Patient declined Yes (MAU/Ambulatory/Procedural Areas - Information given)     Copy of Healthcare Power of Attorney in Chart? Yes - validated most recent copy scanned in chart (See row information)  Yes - validated most recent copy scanned in chart (See row information) No - copy requested Yes - validated most recent copy scanned in chart (See row information) No - copy requested Yes - validated most recent copy scanned in chart (See row information)  Would patient like information on creating a medical advance directive?    No - Patient declined       Current Medications (verified) Outpatient Encounter Medications as of 06/13/2023  Medication Sig   acetaminophen (TYLENOL) 500 MG tablet Take 1,000 mg by mouth every 8 (eight) hours as needed for moderate pain.   ascorbic acid (VITAMIN C) 500 MG tablet Take 500 mg by mouth daily.   CALCIUM-VITAMIN D PO Take 600-800 mg by mouth daily.   Magnesium Oxide 500 MG TABS Take 500 mg by mouth daily.   Melatonin 1 MG CAPS Take 1 mg by mouth at bedtime as needed (sleep).   memantine (NAMENDA) 5 MG tablet Take 5 mg by mouth 2 (two) times daily.   Multiple Vitamin (MULTIVITAMIN) tablet Take 1 tablet by mouth daily.   Multiple Vitamins-Minerals (PRESERVISION AREDS 2 PO) Take 1 capsule by mouth 2 (two) times daily.    omeprazole (PRILOSEC) 20 MG capsule TAKE 1 CAPSULE (20 MG TOTAL) BY MOUTH 2 (TWO) TIMES DAILY BEFORE A MEAL. FOR HEARTBURN.   Polyethyl Glycol-Propyl Glycol (SYSTANE OP) Apply 1 drop to eye 3 (three) times daily as needed (dry eyes).   Potassium Gluconate  550 MG TABS Take 550 mg by mouth daily.   Probiotic Product (PROBIOTIC DAILY PO) Take 1 capsule by mouth daily.    vitamin B-12 (CYANOCOBALAMIN) 1000 MCG tablet Take 1,000 mcg by mouth daily.   No facility-administered encounter medications on file as of 06/13/2023.    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 a-fib x 1 episode patient states he "auto corrected" seen at [facility]   Foul smelling urine 06/22/2022   GERD (gastroesophageal reflux disease)    Headache    poor posture - none recently   History of kidney stones    Hydrocephalus (HCC)    Hyperlipidemia    Neuropathy    PSVT (paroxysmal supraventricular tachycardia) (HCC) 2009   Controlled with "breathing process"   Renal stone 9/08, 6/15   Wears dentures    full upper   Past Surgical History:  Procedure Laterality Date   CARDIAC CATHETERIZATION  2008   [place], [place]   CATARACT EXTRACTION [facility] Left 07/27/2015   Procedure: CATARACT EXTRACTION PHACO AND INTRAOCULAR LENS PLACEMENT (IOC);  Surgeon: Lockie Mola, MD;  Location: [facility];  Service: Ophthalmology;  Laterality: Left;  TORIC   CATARACT EXTRACTION W/PHACO Right 08/24/2015   Procedure: CATARACT EXTRACTION PHACO AND INTRAOCULAR LENS PLACEMENT (IOC);  Surgeon: Lockie Mola, MD;  Location: [facility];  Service: Ophthalmology;  Laterality: Right;  TORIC   COLONOSCOPY WITH PROPOFOL N/A 08/10/2020   Procedure: COLONOSCOPY WITH PROPOFOL;  Surgeon: Toledo, Boykin Nearing, MD;  Location: [facility];  Service: Gastroenterology;  Laterality: N/A;   HEMORROIDECTOMY  2010   banding   IR ANGIO INTRA EXTRACRAN SEL COM CAROTID INNOMINATE BILAT MOD SED  10/30/2019   IR ANGIO VERTEBRAL SEL VERTEBRAL UNI R MOD SED  10/30/2019   IR RADIOLOGIST EVAL & MGMT  04/11/2020   IR US GUIDE VASC ACCESS RIGHT  10/30/2019   KIDNEY STONE SURGERY  9/08, 6/15   lithotripsy   LAPAROSCOPIC REVISION VENTRICULAR-PERITONEAL (V-P) SHUNT  01/08/2020   Procedure: LAPAROSCOPIC REVISION VENTRICULAR-PERITONEAL (V-P) SHUNT;  Surgeon: Lisbeth Renshaw, MD;  Location: [facility] OR;  Service: Neurosurgery;;   PROSTATE BIOPSY  2007   SKIN CANCER EXCISION     TOENAIL TRIMMING  11/2017   Dr. Orland Jarred   TONSILLECTOMY     VASECTOMY      VENTRICULOPERITONEAL SHUNT Right 01/08/2020   Procedure: SHUNT INSERTION VENTRICULAR-PERITONEAL;  Surgeon: Lisbeth Renshaw, MD;  Location: [facility] OR;  Service: Neurosurgery;  Laterality: Right;   Family History  Problem Relation Age of Onset   AAA (abdominal aortic aneurysm) Father    Parkinson's disease Mother    Arthritis Mother    Social History   Socioeconomic History   Marital status: Married    Spouse name: Not on file   Number of children: Not on file   Years of education: Not on file   Highest education level: Not on file  Occupational History   Not on file  Tobacco Use   Smoking status: Former    Current packs/day: 0.00    Average packs/day: 0.5 packs/day for 38.0 years (19.0 ttl pk-yrs)    Types: Cigarettes    Start date: 07/14/1959    Quit date: 07/13/1997    Years since quitting: 25.9  Smokeless tobacco: Never  Vaping Use   Vaping status: Never Used  Substance and Sexual Activity   Alcohol use: No   Drug use: No   Sexual activity: Not on file  Other Topics Concern   Not on file  Social History Narrative   Not on file   Social Drivers of Health   Financial Resource Strain: Low Risk  (06/13/2023)   Overall Financial Resource Strain (CARDIA)    Difficulty of Paying Living Expenses: Not hard at all  Food Insecurity: No Food Insecurity (06/13/2023)   Hunger Vital Sign    Worried About Running Out of Food in the Last Year: Never true    Ran Out of Food in the Last Year: Never true  Transportation Needs: No Transportation Needs (06/13/2023)   PRAPARE - Administrator, Civil Service (Medical): No    Lack of Transportation (Non-Medical): No  Physical Activity: Inactive (06/13/2023)   Exercise Vital Sign    Days of Exercise per Week: 0 days    Minutes of Exercise per Session: 0 min  Stress: No Stress Concern Present (06/13/2023)   [employer] of Occupational Health - Occupational Stress Questionnaire    Feeling of Stress : Not at all  Social  Connections: Socially Integrated (06/13/2023)   Social Connection and Isolation Panel [NHANES]    Frequency of Communication with Friends and Family: Once a week    Frequency of Social Gatherings with Friends and Family: Twice a week    Attends Religious Services: More than 4 times per year    Active Member of [employer] or Organizations: Yes    Attends Engineer, structural: More than 4 times per year    Marital Status: Married    Tobacco Counseling Counseling given: Not Answered  Clinical Intake:  Pre-visit preparation completed: No  Pain : 0-10 Pain Score: 2  Pain Type: Acute pain Pain Location: Head Pain Descriptors / Indicators: Aching Pain Onset: In the past 7 days Pain Frequency: Intermittent Pain Relieving Factors: tylenol  Pain Relieving Factors: tylenol  BMI - recorded: 27.55 Nutritional Status: BMI 25 -29 Overweight Nutritional Risks: None Diabetes: No  How often do you need to have someone help you when you read instructions, pamphlets, or other written materials from your doctor or pharmacy?: 1 - Never  Interpreter Needed?: No  Comments: lives with wife Information entered by :: B.Nabila Albarracin,LPN   Activities of Daily Living    06/13/2023    2:06 PM  In your present state of health, do you have any difficulty performing the following activities:  Hearing? 0  Vision? 1  Difficulty concentrating or making decisions? 1  Walking or climbing stairs? 0  Dressing or bathing? 0  Doing errands, shopping? 1  Comment does not drive  Preparing Food and eating ? N  Using the Toilet? N  In the past six months, have you accidently leaked urine? N  Do you have problems with loss of bowel control? N  Managing your Medications? N  Managing your Finances? N  Housekeeping or managing your Housekeeping? N    Patient Care Team: [name], NP as PCP - General (Internal Medicine) Antonieta Iba, MD as PCP - Cardiology (Cardiology) Antonieta Iba, MD  as Consulting Physician (Cardiology) Lockie Mola, MD as Referring Physician (Ophthalmology) Ccs, Md, MD as Consulting Physician (General Surgery) Prescott Parma, MD as Physician Assistant (Neurosurgery)  Indicate any recent Medical Services you may have received from other than [facility] providers in the  past year (date may be approximate).     Assessment:   This is a routine wellness examination for [place].  Hearing/Vision screen Hearing Screening - Comments:: Pt says his hearing is good with hearing aids Vision Screening - Comments:: Pt says his vision is not too good:AMD affects left eye;rt eye alright [facility]   Goals Addressed             This Visit's Progress    DIET - EAT MORE FRUITS AND VEGETABLES   Not on track    COMPLETED: DIET - INCREASE WATER INTAKE   Not on track    Starting 01/28/2018, I will attempt to drink at least 4 glasses of water daily.      COMPLETED: Patient Stated   Not on track    Would like to increase exercise and walk more       Depression Screen    06/13/2023    1:57 PM 05/31/2023   11:59 AM 03/26/2023   11:02 AM 06/22/2022   11:33 AM 05/24/2021   11:24 AM 07/21/2020    2:58 PM 02/06/2019    3:43 PM  PHQ 2/9 Scores  PHQ - 2 Score 0 0 1 2 0 3 0  PHQ- 9 Score  2 4 5  12      Fall Risk    06/13/2023    1:51 PM 03/26/2023   10:15 AM 12/07/2022    2:54 PM 06/22/2022   11:32 AM 05/25/2022   11:19 AM  Fall Risk   Falls in the past year? 0 0 0 1 0  Number falls in past yr: 0 0 0 0 0  Injury with Fall? 0 0 0 0 0  Risk for fall due to : No Fall Risks No Fall Risks No Fall Risks History of fall(s) No Fall Risks  Follow up Education provided;Falls prevention discussed Falls evaluation completed Falls evaluation completed Falls evaluation completed Falls prevention discussed;Falls evaluation completed    MEDICARE RISK AT HOME: Medicare Risk at Home Any stairs in or around the home?: No If so, are there any without handrails?: No Home free of  loose throw rugs in walkways, pet beds, electrical cords, etc?: Yes Adequate lighting in your home to reduce risk of falls?: Yes Life alert?: No Use of a cane, walker or w/c?: Yes (cane occassionally) Grab bars in the bathroom?: Yes Shower chair or bench in shower?: No Elevated toilet seat or a handicapped toilet?: Yes  TIMED UP AND GO:  Was the test performed?  No    Cognitive Function:    09/08/2019   10:37 AM 07/15/2019    1:23 PM 01/28/2018   11:53 AM 01/10/2017    8:46 AM  MMSE - Mini Mental State Exam  Orientation to time 5 5 5 5   Orientation to Place 5 5 5 5   Registration 3 3 3 3   Attention/ Calculation 3 5 0 0  Recall 3 3 3 3   Language- name 2 objects 2 2 0 0  Language- repeat 1 1 1 1   Language- follow 3 step command 3 3 3 3   Language- read & follow direction 1 1 0 0  Write a sentence 1 1 0 0  Copy design 1 1 0 0  Total score 28 30 20 20         06/13/2023    2:11 PM 05/25/2022   11:24 AM  6CIT Screen  What Year? 0 points 0 points  What month? 0 points 0 points  What  time? 0 points 0 points  Count back from 20 0 points 0 points  Months in reverse 2 points 2 points  Repeat phrase 0 points 2 points  Total Score 2 points 4 points    Immunizations Immunization History  Administered Date(s) Administered   Fluad Quad(high Dose 65+) 02/09/2020, 03/01/2021   Influenza,inj,Quad PF,6+ Mos 04/12/2017, 01/28/2018, 02/06/2019   Influenza-Unspecified 04/12/2017, 01/28/2018, 02/06/2019   PFIZER Comirnaty(Gray Top)Covid-19 Tri-Sucrose Vaccine 06/10/2019, 07/01/2019   PFIZER(Purple Top)SARS-COV-2 Vaccination 06/10/2019, 07/01/2019, 03/02/2020, 11/23/2020   PNEUMOCOCCAL CONJUGATE-20 11/22/2022   Pfizer(Comirnaty)Fall Seasonal Vaccine 12 years and older 03/23/2022   Pneumococcal Conjugate-13 01/10/2017   Pneumococcal Polysaccharide-23 08/13/2010, 09/01/2010   Tdap 05/27/2011, 06/13/2011, 11/22/2022   Zoster, Live 09/01/2010    TDAP status: Up to date  Flu Vaccine  status: Up to date  Pneumococcal vaccine status: Up to date  Covid-19 vaccine status: Completed vaccines  Qualifies for Shingles Vaccine? Yes   Zostavax completed No   Shingrix Completed?: No.    Education has been provided regarding the importance of this vaccine. Patient has been advised to call insurance company to determine out of pocket expense if they have not yet received this vaccine. Advised may also receive vaccine at local pharmacy or Health Dept. Verbalized acceptance and understanding.  Screening Tests Health Maintenance  Topic Date Due   Zoster Vaccines- Shingrix (1 of 2) 01/09/1960   COVID-19 Vaccine (8 - 2024-25 season) 01/13/2023   Medicare Annual Wellness (AWV)  06/12/2024   Pneumonia Vaccine 70+ Years old  Completed   INFLUENZA VACCINE  Completed   HPV VACCINES  Aged Out   DTaP/Tdap/Td  Discontinued   Hepatitis C Screening  Discontinued    Health Maintenance  Health Maintenance Due  Topic Date Due   Zoster Vaccines- Shingrix (1 of 2) 01/09/1960   COVID-19 Vaccine (8 - 2024-25 season) 01/13/2023    Colorectal cancer screening: No longer required.   Lung Cancer Screening: (Low Dose CT Chest recommended if Age 41-80 years, 20 pack-year currently smoking OR have quit w/in 15years.) does not qualify.   Lung Cancer Screening Referral: no  Additional Screening:  Hepatitis C Screening: does not qualify; Completed 12/02/2019  Vision Screening: Recommended annual ophthalmology exams for early detection of glaucoma and other disorders of the eye. Is the patient up to date with their annual eye exam?  Yes  Who is the provider or what is the name of the office in which the patient attends annual eye exams? Dr Inez Pilgrim  If pt is not established with a provider, would they like to be referred to a provider to establish care? No .   Dental Screening: Recommended annual dental exams for proper oral hygiene  Diabetic Foot Exam:   Community Resource Referral /  Chronic Care Management: CRR required this visit?  No   CCM required this visit?  Appt scheduled with PCP     Plan:     I have personally reviewed and noted the following in the patient's chart:   Medical and social history Use of alcohol, tobacco or illicit drugs  Current medications and supplements including opioid prescriptions. Patient is not currently taking opioid prescriptions. Functional ability and status Nutritional status Physical activity Advanced directives List of other physicians Hospitalizations, surgeries, and ER visits in previous 12 months Vitals Screenings to include cognitive, depression, and falls Referrals and appointments  In addition, I have reviewed and discussed with patient certain preventive protocols, quality metrics, and best practice recommendations. A written personalized care plan for  preventive services as well as general preventive health recommendations were provided to patient.    Sue Lush, LPN   08/20/8117   After Visit Summary: (MyChart) Due to this being a telephonic visit, the after visit summary with patients personalized plan was offered to patient via MyChart   Nurse Notes: The pt wife was on call today for AWV and relays they stopped the Lipitor as it gave the pt "bad leg cramps". He reports his legs still swell (bottom near ankles).

## 2023-06-13 NOTE — Patient Instructions (Signed)
Mr. Salsgiver , Thank you for taking time to come for your Medicare Wellness Visit. I appreciate your ongoing commitment to your health goals. Please review the following plan we discussed and let me know if I can assist you in the future.   Referrals/Orders/Follow-Ups/Clinician Recommendations: none  This is a list of the screening recommended for you and due dates:  Health Maintenance  Topic Date Due   Zoster (Shingles) Vaccine (1 of 2) 01/09/1960   COVID-19 Vaccine (8 - 2024-25 season) 01/13/2023   Medicare Annual Wellness Visit  06/12/2024   Pneumonia Vaccine  Completed   Flu Shot  Completed   HPV Vaccine  Aged Out   DTaP/Tdap/Td vaccine  Discontinued   Hepatitis C Screening  Discontinued    Advanced directives: (In Chart) A copy of your advanced directives are scanned into your chart should your provider ever need it.  Next Medicare Annual Wellness Visit scheduled for next year: Yes 06/13/23 @ 1:40pm televisit

## 2023-07-03 ENCOUNTER — Encounter: Payer: Self-pay | Admitting: Cardiovascular Disease

## 2023-07-04 ENCOUNTER — Ambulatory Visit: Payer: TRICARE For Life (TFL) | Admitting: Primary Care

## 2023-07-04 MED ORDER — METOPROLOL SUCCINATE ER 50 MG PO TB24: 50.0000 mg | ORAL_TABLET | Freq: Two times a day (BID) | ORAL | 3 refills | Status: DC

## 2023-07-04 NOTE — Telephone Encounter (Signed)
Can you please review -- these medications are nor on patients medication list.

## 2023-07-08 DIAGNOSIS — H353112 Nonexudative age-related macular degeneration, right eye, intermediate dry stage: Secondary | ICD-10-CM | POA: Diagnosis not present

## 2023-07-08 DIAGNOSIS — Z961 Presence of intraocular lens: Secondary | ICD-10-CM | POA: Diagnosis not present

## 2023-07-08 DIAGNOSIS — H353221 Exudative age-related macular degeneration, left eye, with active choroidal neovascularization: Secondary | ICD-10-CM | POA: Diagnosis not present

## 2023-07-12 ENCOUNTER — Ambulatory Visit (INDEPENDENT_AMBULATORY_CARE_PROVIDER_SITE_OTHER): Payer: Medicare Other | Admitting: Vascular Surgery

## 2023-07-12 ENCOUNTER — Ambulatory Visit (INDEPENDENT_AMBULATORY_CARE_PROVIDER_SITE_OTHER): Payer: Medicare Other

## 2023-07-12 VITALS — BP 117/78 | HR 60 | Resp 16 | Wt 195.2 lb

## 2023-07-12 DIAGNOSIS — G912 (Idiopathic) normal pressure hydrocephalus: Secondary | ICD-10-CM

## 2023-07-12 DIAGNOSIS — R7303 Prediabetes: Secondary | ICD-10-CM | POA: Diagnosis not present

## 2023-07-12 DIAGNOSIS — I714 Abdominal aortic aneurysm, without rupture, unspecified: Secondary | ICD-10-CM

## 2023-07-12 NOTE — Progress Notes (Signed)
 MRN : [id number]  Adam Juarez is a 83 y.o. ([birth year]) male who presents with chief complaint of  Chief Complaint  Patient presents with   Follow-up    6 month AAA follow up  .  History of Present Illness: Patient returns today in follow up of his AAA. He is doing well with no aneurysm related symptoms. Specifically, the patient denies new back or abdominal pain, or signs of peripheral embolization.  His duplex today measured the AAA at 3.8 cm in maximal diameter.  This is smaller than his previous CT scan study 6 months ago, but clearly has not progressed or enlarged.  Duplex tends to underestimate slightly from CT scan findings.  Current Outpatient Medications  Medication Sig Dispense Refill   acetaminophen (TYLENOL) 500 MG tablet Take 1,000 mg by mouth every 8 (eight) hours as needed for moderate pain.     ascorbic acid (VITAMIN C) 500 MG tablet Take 500 mg by mouth daily.     CALCIUM-VITAMIN D PO Take 600-800 mg by mouth daily.     ELIQUIS 5 MG TABS tablet Take 5 mg by mouth 2 (two) times daily.     Magnesium Oxide 500 MG TABS Take 500 mg by mouth daily.     Melatonin 1 MG CAPS Take 1 mg by mouth at bedtime as needed (sleep).     memantine (NAMENDA) 5 MG tablet Take 5 mg by mouth 2 (two) times daily.     metoprolol succinate (TOPROL-XL) 50 MG 24 hr tablet Take 1 tablet (50 mg total) by mouth in the morning and at bedtime. Take with or immediately following a meal. 180 tablet 3   Multiple Vitamin (MULTIVITAMIN) tablet Take 1 tablet by mouth daily.     Multiple Vitamins-Minerals (PRESERVISION AREDS 2 PO) Take 1 capsule by mouth 2 (two) times daily.      omeprazole (PRILOSEC) 20 MG capsule TAKE 1 CAPSULE (20 MG TOTAL) BY MOUTH 2 (TWO) TIMES DAILY BEFORE A MEAL. FOR HEARTBURN. 180 capsule 2   Polyethyl Glycol-Propyl Glycol (SYSTANE OP) Apply 1 drop to eye 3 (three) times daily as needed (dry eyes).     Potassium Gluconate 550 MG TABS Take 550 mg by mouth daily.     Probiotic Product  (PROBIOTIC DAILY PO) Take 1 capsule by mouth daily.      vitamin B-12 (CYANOCOBALAMIN) 1000 MCG tablet Take 1,000 mcg by mouth daily.     No current facility-administered medications for this visit.    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 a-fib x 1 episode patient states he "auto corrected" seen at [facility]   Foul smelling urine 06/22/2022   GERD (gastroesophageal reflux disease)    Headache    poor posture - none recently   History of kidney stones    Hydrocephalus (HCC)    Hyperlipidemia    Neuropathy    PSVT (paroxysmal supraventricular tachycardia) (HCC) 2009   Controlled with "breathing process"   Renal stone 9/08, 6/15   Wears dentures    full upper    Past Surgical History:  Procedure Laterality Date   CARDIAC CATHETERIZATION  2008   [place], [place]   CATARACT EXTRACTION [facility] Left 07/27/2015   Procedure: CATARACT EXTRACTION PHACO AND INTRAOCULAR LENS PLACEMENT (IOC);  Surgeon: Lockie Mola, MD;  Location:  [facility];  Service: Ophthalmology;  Laterality: Left;  TORIC   CATARACT EXTRACTION W/PHACO Right 08/24/2015   Procedure: CATARACT EXTRACTION PHACO AND INTRAOCULAR LENS PLACEMENT (IOC);  Surgeon: Lockie Mola, MD;  Location: [facility];  Service: Ophthalmology;  Laterality: Right;  TORIC   COLONOSCOPY WITH PROPOFOL N/A 08/10/2020   Procedure: COLONOSCOPY WITH PROPOFOL;  Surgeon: Toledo, Boykin Nearing, MD;  Location: [facility];  Service: Gastroenterology;  Laterality: N/A;   HEMORROIDECTOMY  2010   banding   IR ANGIO INTRA EXTRACRAN SEL COM CAROTID INNOMINATE BILAT MOD SED  10/30/2019   IR ANGIO VERTEBRAL SEL VERTEBRAL UNI R MOD SED  10/30/2019   IR RADIOLOGIST EVAL & MGMT  04/11/2020   IR US GUIDE VASC ACCESS RIGHT  10/30/2019   KIDNEY STONE SURGERY  9/08, 6/15   lithotripsy   LAPAROSCOPIC  REVISION VENTRICULAR-PERITONEAL (V-P) SHUNT  01/08/2020   Procedure: LAPAROSCOPIC REVISION VENTRICULAR-PERITONEAL (V-P) SHUNT;  Surgeon: Lisbeth Renshaw, MD;  Location: [facility];  Service: Neurosurgery;;   PROSTATE BIOPSY  2007   SKIN CANCER EXCISION     TOENAIL TRIMMING  11/2017   Dr. Orland Jarred   TONSILLECTOMY     VASECTOMY     VENTRICULOPERITONEAL SHUNT Right 01/08/2020   Procedure: SHUNT INSERTION VENTRICULAR-PERITONEAL;  Surgeon: Lisbeth Renshaw, MD;  Location: [facility];  Service: Neurosurgery;  Laterality: Right;     Social History   Tobacco Use   Smoking status: Former    Current packs/day: 0.00    Average packs/day: 0.5 packs/day for 38.0 years (19.0 ttl pk-yrs)    Types: Cigarettes    Start date: 07/14/1959    Quit date: 07/13/1997    Years since quitting: 26.0   Smokeless tobacco: Never  Vaping Use   Vaping status: Never Used  Substance Use Topics   Alcohol use: No   Drug use: No      Family History  Problem Relation Age of Onset   AAA (abdominal aortic aneurysm) Father    Parkinson's disease Mother    Arthritis Mother     Allergies  Allergen Reactions   Bee Venom Anaphylaxis and Hives   Other Other (See Comments)    Pistachios - tickles throat  Pistachios - tickles throat   Tramadol Other (See Comments)    REVIEW OF SYSTEMS (Negative unless checked)   Constitutional: [] Weight loss  [] Fever  [] Chills Cardiac: [] Chest pain   [] Chest pressure   [] Palpitations   [] Shortness of breath when laying flat   [] Shortness of breath at rest   [] Shortness of breath with exertion. Vascular:  [] Pain in legs with walking   [] Pain in legs at rest   [] Pain in legs when laying flat   [] Claudication   [] Pain in feet when walking  [] Pain in feet at rest  [] Pain in feet when laying flat   [] History of DVT   [] Phlebitis   [] Swelling in legs   [] Varicose veins   [] Non-healing ulcers Pulmonary:   [] Uses home oxygen   [] Productive cough   [] Hemoptysis   [] Wheeze  [] COPD    [] Asthma Neurologic:  [x] Dizziness  [] Blackouts   [] Seizures   [] History of stroke   [] History of TIA  [] Aphasia   [] Temporary blindness   [] Dysphagia   [] Weakness or numbness in arms   [] Weakness or numbness in legs Musculoskeletal:  [x] Arthritis   [] Joint swelling   [x] Joint pain   [] Low back pain Hematologic:  [] Easy bruising  [] Easy bleeding   [] Hypercoagulable state   [] Anemic  [] Hepatitis Gastrointestinal:  []   Blood in stool   [] Vomiting blood  [x] Gastroesophageal reflux/heartburn   [] Abdominal pain Genitourinary:  [] Chronic kidney disease   [] Difficult urination  [] Frequent urination  [] Burning with urination   [] Hematuria Skin:  [] Rashes   [] Ulcers   [] Wounds Psychological:  [] History of anxiety   []  History of major depression.  Physical Examination  BP 117/78   Pulse 60   Resp 16   Wt 195 lb 3.2 oz (88.5 kg)   BMI 28.01 kg/m  Gen:  WD/WN, NAD. Appears younger than stated age. Head: [place]/AT, No temporalis wasting. Ear/Nose/Throat: Hearing grossly intact, nares w/o erythema or drainage Eyes: Conjunctiva clear. Sclera non-icteric Neck: Supple.  Trachea midline Pulmonary:  Good air movement, no use of accessory muscles.  Cardiac: RRR, no JVD Vascular:  Vessel Right Left  Radial Palpable Palpable                                   Gastrointestinal: soft, non-tender/non-distended. No guarding/reflex.  Musculoskeletal: M/S 5/5 throughout.  No deformity or atrophy. No edema. Neurologic: Sensation grossly intact in extremities.  Symmetrical.  Speech is fluent.  Psychiatric: Judgment intact, Mood & affect appropriate for pt's clinical situation. Dermatologic: No rashes or ulcers noted.  No cellulitis or open wounds.      Labs Recent Results (from the past 2160 hours)  CBC with Differential/Platelet     Status: Abnormal   Collection Time: 05/01/23 11:08 AM  Result Value Ref Range   WBC 5.4 4.0 - 10.5 K/uL   RBC 4.46 4.22 - 5.81 Mil/uL   Hemoglobin 15.1 13.0 - 17.0 g/dL    HCT 91.4 78.2 - 95.6 %   MCV 99.7 78.0 - 100.0 fl   MCHC 34.0 30.0 - 36.0 g/dL   RDW 21.3 08.6 - 57.8 %   Platelets 150.0 150.0 - 400.0 K/uL   Neutrophils Relative % 65.0 43.0 - 77.0 %   Lymphocytes Relative 17.2 12.0 - 46.0 %   Monocytes Relative 15.5 (H) 3.0 - 12.0 %   Eosinophils Relative 1.4 0.0 - 5.0 %   Basophils Relative 0.9 0.0 - 3.0 %   Neutro Abs 3.5 1.4 - 7.7 K/uL   Lymphs Abs 0.9 0.7 - 4.0 K/uL   Monocytes Absolute 0.8 0.1 - 1.0 K/uL   Eosinophils Absolute 0.1 0.0 - 0.7 K/uL   Basophils Absolute 0.0 0.0 - 0.1 K/uL  CBC     Status: None   Collection Time: 05/31/23 12:25 PM  Result Value Ref Range   WBC 4.9 4.0 - 10.5 K/uL   RBC 4.42 4.22 - 5.81 Mil/uL   Platelets 150.0 150.0 - 400.0 K/uL   Hemoglobin 14.9 13.0 - 17.0 g/dL   HCT 46.9 62.9 - 52.8 %   MCV 99.1 78.0 - 100.0 fl   MCHC 34.0 30.0 - 36.0 g/dL   RDW 41.3 24.4 - 01.0 %  Comprehensive metabolic panel     Status: None   Collection Time: 05/31/23 12:25 PM  Result Value Ref Range   Sodium 139 135 - 145 mEq/L   Potassium 4.5 3.5 - 5.1 mEq/L   Chloride 103 96 - 112 mEq/L   CO2 28 19 - 32 mEq/L   Glucose, Bld 91 70 - 99 mg/dL   BUN 16 6 - 23 mg/dL   Creatinine, Ser 2.72 0.40 - 1.50 mg/dL   Total Bilirubin 1.2 0.2 - 1.2 mg/dL   Alkaline Phosphatase 78 39 - 117  U/L   AST 18 0 - 37 U/L   ALT 14 0 - 53 U/L   Total Protein 7.4 6.0 - 8.3 g/dL   Albumin 4.4 3.5 - 5.2 g/dL   GFR 16.10 >96.04 mL/min    Comment: Calculated using the CKD-EPI Creatinine Equation (2021)   Calcium 9.7 8.4 - 10.5 mg/dL  POCT urinalysis dipstick     Status: None   Collection Time: 05/31/23 12:48 PM  Result Value Ref Range   Color, UA yellow    Clarity, UA clear    Glucose, UA Negative Negative   Bilirubin, UA neg    Ketones, UA neg    Spec Grav, UA 1.020 1.010 - 1.025   Blood, UA neg    pH, UA 6.0 5.0 - 8.0   Protein, UA Negative Negative   Urobilinogen, UA 0.2 0.2 or 1.0 E.U./dL   Nitrite, UA neg    Leukocytes, UA Negative  Negative   Appearance     Odor      Radiology No results found.  Assessment/Plan  AAA (abdominal aortic aneurysm) (HCC) His duplex today measured the AAA at 3.8 cm in maximal diameter.  This is smaller than his previous CT scan study 6 months ago, but clearly has not progressed or enlarged.  Duplex tends to underestimate slightly from CT scan findings.  No role for intervention at this level.  Continue to follow this on 64-month intervals with duplex.  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  No worrisome symptoms currently.   Prediabetes blood glucose control important in reducing the progression of atherosclerotic disease. Also, involved in wound healing. On appropriate medications.  Festus Barren, MD  07/12/2023 10:50 AM    This note was created with Dragon medical transcription system.  Any errors from dictation are purely unintentional

## 2023-07-12 NOTE — Assessment & Plan Note (Signed)
 His duplex today measured the AAA at 3.8 cm in maximal diameter.  This is smaller than his previous CT scan study 6 months ago, but clearly has not progressed or enlarged.  Duplex tends to underestimate slightly from CT scan findings.  No role for intervention at this level.  Continue to follow this on 63-month intervals with duplex.

## 2023-07-19 DIAGNOSIS — G912 (Idiopathic) normal pressure hydrocephalus: Secondary | ICD-10-CM | POA: Diagnosis not present

## 2023-07-26 ENCOUNTER — Other Ambulatory Visit: Payer: Self-pay | Admitting: Neurosurgery

## 2023-07-26 DIAGNOSIS — G912 (Idiopathic) normal pressure hydrocephalus: Secondary | ICD-10-CM

## 2023-07-31 ENCOUNTER — Ambulatory Visit
Admission: RE | Admit: 2023-07-31 | Discharge: 2023-07-31 | Disposition: A | Discharge status: Home / Self Care | Source: Ambulatory Visit | Attending: Neurosurgery | Admitting: Neurosurgery

## 2023-07-31 DIAGNOSIS — Z982 Presence of cerebrospinal fluid drainage device: Secondary | ICD-10-CM | POA: Diagnosis not present

## 2023-07-31 DIAGNOSIS — G912 (Idiopathic) normal pressure hydrocephalus: Secondary | ICD-10-CM

## 2023-08-05 NOTE — Progress Notes (Unsigned)
 Date:  08/06/2023   ID:  [name], DOB 1941-02-01, MRN [id number]  Patient Location:  [address] [place] [address]   Provider location:   [facility], [place] office  PCP:  [name], NP  Cardiologist:  Hubbard Robinson [facility]  Chief Complaint  Patient presents with   6 month follow up     "Doing well."     History of Present Illness:    Adam Juarez is a 83 y.o. male past medical history of Prior smoking history for 35-40 years, quit age 16 PAD,  Moderate aortic atherosclerosis of aorta and iliac vessels on CT scan in 2015  paroxysmal atrial fibrillation,  prior Holter monitor August 2013 showing frequent PVCs,  frequent short runs of supraventricular tachycardia,  stress test no ischemia September 2013,  4.2 cm infrarenal abdominal aortic aneurysm who presents for follow-up of his paroxysmal atrial fibrillation, AAA  LOV 5/23 Seen by one of our providers October 2024  Walks at home, has a track near his house No rhythm issues, uses his watch to monitor for atrial fibrillation On Eliquis and metoprolol Denies any bleeding, no falls  AAA stable followed by vascular  Labs reviewed A1C 6.1 Total chol 160, LDL 63  EKG personally reviewed by myself on todays visit EKG Interpretation Date/Time:  Tuesday August 06 2023 10:36:28 EDT Ventricular Rate:  66 PR Interval:  174 QRS Duration:  94 QT Interval:  386 QTC Calculation: 404 R Axis:   2  Text Interpretation: Normal sinus rhythm Normal ECG When compared with ECG of 12-Feb-2023 08:42, Premature atrial complexes are no longer Present Confirmed by Julien Nordmann [phone]) on 08/06/2023 10:55:43 AM    Er for abdominal pain, CT ABD performed showing stable AAA 4.2 cm Stable on CT compared to 2020  Seizure like episodes, followed by Dr. Sherryll Burger We will have clustering, started on lamotrigine When he has these spells, will glaze over,  brief episodes of mental status change  Stress  test end of 2020 no ischemia  In the ER with back pain, 03/12/19  CT AB 4.1 cm infrarenal abdominal aortic aneurysm without complicating features.  Descending and proximal sigmoid diverticulosis. 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 Marcell Barlow    Stress test:03/02/2019 reviewed with him There was no ST segment deviation noted during stress. No T wave inversion was noted during stress. Defect 1: There is a medium defect of mild severity present in the basal inferior and mid inferior location. This is likely due to diaphragm attenuation. The study is normal. This is a low risk study. The left ventricular ejection fraction is normal (55-65%).   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 a-fib x 1 episode patient states he "auto corrected" seen at [facility]   Foul smelling urine 06/22/2022   GERD (gastroesophageal reflux disease)    Headache    poor posture - none recently   History of kidney stones    Hydrocephalus (HCC)    Hyperlipidemia    Neuropathy    PSVT (paroxysmal supraventricular tachycardia) (HCC) 2009   Controlled with "breathing process"   Renal stone 9/08, 6/15   Wears dentures    full upper   Past Surgical History:  Procedure Laterality  Date   CARDIAC CATHETERIZATION  2008   [place], [place]   CATARACT EXTRACTION [facility] Left 07/27/2015   Procedure: CATARACT EXTRACTION PHACO AND INTRAOCULAR LENS PLACEMENT (IOC);  Surgeon: Lockie Mola, MD;  Location: [facility];  Service: Ophthalmology;  Laterality: Left;  TORIC   CATARACT EXTRACTION W/PHACO Right 08/24/2015   Procedure: CATARACT EXTRACTION PHACO AND INTRAOCULAR LENS PLACEMENT (IOC);  Surgeon: Lockie Mola, MD;  Location: [facility];  Service: Ophthalmology;  Laterality: Right;  TORIC   COLONOSCOPY WITH  PROPOFOL N/A 08/10/2020   Procedure: COLONOSCOPY WITH PROPOFOL;  Surgeon: Toledo, Boykin Nearing, MD;  Location: [facility];  Service: Gastroenterology;  Laterality: N/A;   HEMORROIDECTOMY  2010   banding   IR ANGIO INTRA EXTRACRAN SEL COM CAROTID INNOMINATE BILAT MOD SED  10/30/2019   IR ANGIO VERTEBRAL SEL VERTEBRAL UNI R MOD SED  10/30/2019   IR RADIOLOGIST EVAL & MGMT  04/11/2020   IR US GUIDE VASC ACCESS RIGHT  10/30/2019   KIDNEY STONE SURGERY  9/08, 6/15   lithotripsy   LAPAROSCOPIC REVISION VENTRICULAR-PERITONEAL (V-P) SHUNT  01/08/2020   Procedure: LAPAROSCOPIC REVISION VENTRICULAR-PERITONEAL (V-P) SHUNT;  Surgeon: Lisbeth Renshaw, MD;  Location: [facility];  Service: Neurosurgery;;   PROSTATE BIOPSY  2007   SKIN CANCER EXCISION     TOENAIL TRIMMING  11/2017   Dr. Orland Jarred   TONSILLECTOMY     VASECTOMY     VENTRICULOPERITONEAL SHUNT Right 01/08/2020   Procedure: SHUNT INSERTION VENTRICULAR-PERITONEAL;  Surgeon: Lisbeth Renshaw, MD;  Location: [facility];  Service: Neurosurgery;  Laterality: Right;     Current Meds  Medication Sig   acetaminophen (TYLENOL) 500 MG tablet Take 1,000 mg by mouth every 8 (eight) hours as needed for moderate pain.   ascorbic acid (VITAMIN C) 500 MG tablet Take 500 mg by mouth daily.   atorvastatin (LIPITOR) 20 MG tablet Take 20 mg by mouth daily.   CALCIUM-VITAMIN D PO Take 600-800 mg by mouth daily.   ELIQUIS 5 MG TABS tablet Take 5 mg by mouth 2 (two) times daily.   Magnesium Oxide 500 MG TABS Take 500 mg by mouth daily.   memantine (NAMENDA) 5 MG tablet Take 5 mg by mouth 2 (two) times daily.   metoprolol succinate (TOPROL-XL) 50 MG 24 hr tablet Take 50 mg in the morning, and 25 mg in the evening.   Multiple Vitamins-Minerals (PRESERVISION AREDS 2 PO) Take 1 capsule by mouth 2 (two) times daily.    omeprazole (PRILOSEC) 20 MG capsule Take 20 mg by mouth daily.   Potassium Gluconate 550 MG TABS Take 550 mg by mouth daily.   Probiotic Product (PROBIOTIC  DAILY PO) Take 1 capsule by mouth daily.    vitamin B-12 (CYANOCOBALAMIN) 1000 MCG tablet Take 1,000 mcg by mouth daily.     Allergies:   Bee venom, Other, and Tramadol   Social History   Tobacco Use   Smoking status: Former    Current packs/day: 0.00    Average packs/day: 0.5 packs/day for 38.0 years (19.0 ttl pk-yrs)    Types: Cigarettes    Start date: 07/14/1959    Quit date: 07/13/1997    Years since quitting: 26.0   Smokeless tobacco: Never  Vaping Use   Vaping status: Never Used  Substance Use Topics   Alcohol use: No   Drug use: No     Family Hx: The patient's family history includes AAA (abdominal aortic aneurysm) in his father; Arthritis in his mother; Parkinson's disease in his  mother.  ROS:   Please see the history of present illness.    Review of Systems  Constitutional: Negative.   HENT: Negative.    Respiratory: Negative.    Cardiovascular: Negative.   Gastrointestinal: Negative.   Musculoskeletal: Negative.   Neurological: Negative.   Psychiatric/Behavioral: Negative.    All other systems reviewed and are negative.    Labs/Other Tests and Data Reviewed:    Recent Labs: 05/31/2023: ALT 14; BUN 16; Creatinine, Ser 0.83; Hemoglobin 14.9; Platelets 150.0; Potassium 4.5; Sodium 139   Recent Lipid Panel Lab Results  Component Value Date/Time   CHOL 160 03/26/2023 10:47 AM   TRIG 305.0 (H) 03/26/2023 10:47 AM   HDL 35.30 (L) 03/26/2023 10:47 AM   CHOLHDL 5 03/26/2023 10:47 AM   LDLCALC 63 03/26/2023 10:47 AM   LDLDIRECT 41.0 07/22/2020 08:34 AM    Wt Readings from Last 3 Encounters:  08/06/23 196 lb 4 oz (89 kg)  07/12/23 195 lb 3.2 oz (88.5 kg)  06/13/23 192 lb (87.1 kg)     Exam:    Vital Signs: Vital signs may also be detailed in the HPI BP 120/62 (BP Location: Left Arm, Patient Position: Sitting, Cuff Size: Normal)   Pulse 66   Ht 5\' 10"  (1.778 m)   Wt 196 lb 4 oz (89 kg)   SpO2 95%   BMI 28.16 kg/m   Constitutional:  oriented to person,  place, and time. No distress.  HENT:  Head: Grossly normal Eyes:  no discharge. No scleral icterus.  Neck: No JVD, no carotid bruits  Cardiovascular: Regular rate and rhythm, no murmurs appreciated Pulmonary/Chest: Clear to auscultation bilaterally, no wheezes or rails Abdominal: Soft.  no distension.  no tenderness.  Musculoskeletal: Normal range of motion Neurological:  normal muscle tone. Coordination normal. No atrophy Skin: Skin warm and dry Psychiatric: normal affect, pleasant   ASSESSMENT & PLAN:    Paroxysmal atrial fibrillation (HCC) -  Denies significant arrhythmia, maintaining normal sinus rhythm on today's visit Continue Eliquis 5 twice daily, and metoprolol at current dose  History of seizures Previously on seizure medication, followed by neurology  SVT (supraventricular tachycardia) (HCC) - No recent events, has watch to track rhythm Continue current medications, metoprolol  Aortic atherosclerosis (HCC)  Continue statin, total cholesterol up from 120 now running 160 Eating more ice cream  PAD (peripheral artery disease) (HCC) -  AAA 4.2 CT scan, less than 4 on recent scan Followed by vascular  Smoking history -  Stopped smoking 30 years ago when he was in his 55s  Chronic fatigue Recommend regular walking program  Hyperlipidemia Stay on Lipitor 20, repeat lipid panel through primary care If total cholesterol continues to run 160 may need to add Zetia 10 mg daily He reports eating more ice cream   Signed, Julien Nordmann, MD  08/06/2023 10:55 AM    [facility] [address], [place], [place] [address]

## 2023-08-06 ENCOUNTER — Ambulatory Visit: Payer: Medicare Other | Attending: Cardiovascular Disease | Admitting: Cardiovascular Disease

## 2023-08-06 ENCOUNTER — Encounter: Payer: Self-pay | Admitting: Cardiovascular Disease

## 2023-08-06 VITALS — BP 120/62 | HR 66 | Ht 70.0 in | Wt 196.2 lb

## 2023-08-06 DIAGNOSIS — R011 Cardiac murmur, unspecified: Secondary | ICD-10-CM

## 2023-08-06 DIAGNOSIS — I471 Supraventricular tachycardia, unspecified: Secondary | ICD-10-CM | POA: Diagnosis not present

## 2023-08-06 DIAGNOSIS — R079 Chest pain, unspecified: Secondary | ICD-10-CM

## 2023-08-06 DIAGNOSIS — I714 Abdominal aortic aneurysm, without rupture, unspecified: Secondary | ICD-10-CM

## 2023-08-06 DIAGNOSIS — I480 Paroxysmal atrial fibrillation: Secondary | ICD-10-CM | POA: Diagnosis not present

## 2023-08-06 DIAGNOSIS — I700 Atherosclerosis of aorta: Secondary | ICD-10-CM

## 2023-08-06 DIAGNOSIS — E782 Mixed hyperlipidemia: Secondary | ICD-10-CM | POA: Diagnosis not present

## 2023-08-06 DIAGNOSIS — Z87891 Personal history of nicotine dependence: Secondary | ICD-10-CM

## 2023-08-06 DIAGNOSIS — I739 Peripheral vascular disease, unspecified: Secondary | ICD-10-CM

## 2023-08-06 MED ORDER — METOPROLOL SUCCINATE ER 50 MG PO TB24: ORAL_TABLET | ORAL | 3 refills | Status: AC

## 2023-08-06 NOTE — Patient Instructions (Signed)

## 2023-08-06 NOTE — Addendum Note (Signed)
 Addended by: Darene Lamer T on: 08/06/2023 08:10 AM   Modules accepted: Orders

## 2023-08-08 DIAGNOSIS — H43813 Vitreous degeneration, bilateral: Secondary | ICD-10-CM | POA: Diagnosis not present

## 2023-08-08 DIAGNOSIS — H353221 Exudative age-related macular degeneration, left eye, with active choroidal neovascularization: Secondary | ICD-10-CM | POA: Diagnosis not present

## 2023-08-08 DIAGNOSIS — Z961 Presence of intraocular lens: Secondary | ICD-10-CM | POA: Diagnosis not present

## 2023-08-08 DIAGNOSIS — H353112 Nonexudative age-related macular degeneration, right eye, intermediate dry stage: Secondary | ICD-10-CM | POA: Diagnosis not present

## 2023-08-12 DIAGNOSIS — H353221 Exudative age-related macular degeneration, left eye, with active choroidal neovascularization: Secondary | ICD-10-CM | POA: Diagnosis not present

## 2023-08-14 DIAGNOSIS — M79675 Pain in left toe(s): Secondary | ICD-10-CM | POA: Diagnosis not present

## 2023-08-14 DIAGNOSIS — M79674 Pain in right toe(s): Secondary | ICD-10-CM | POA: Diagnosis not present

## 2023-08-14 DIAGNOSIS — B351 Tinea unguium: Secondary | ICD-10-CM | POA: Diagnosis not present

## 2023-08-16 ENCOUNTER — Ambulatory Visit: Payer: TRICARE For Life (TFL) | Admitting: Primary Care

## 2023-08-16 ENCOUNTER — Encounter: Payer: Self-pay | Admitting: Primary Care

## 2023-08-16 VITALS — BP 114/70 | HR 68 | Temp 98.3°F | Ht 70.0 in | Wt 194.0 lb

## 2023-08-16 DIAGNOSIS — K219 Gastro-esophageal reflux disease without esophagitis: Secondary | ICD-10-CM

## 2023-08-16 DIAGNOSIS — E782 Mixed hyperlipidemia: Secondary | ICD-10-CM | POA: Diagnosis not present

## 2023-08-16 DIAGNOSIS — G4733 Obstructive sleep apnea (adult) (pediatric): Secondary | ICD-10-CM

## 2023-08-16 DIAGNOSIS — R7303 Prediabetes: Secondary | ICD-10-CM | POA: Diagnosis not present

## 2023-08-16 DIAGNOSIS — Z23 Encounter for immunization: Secondary | ICD-10-CM | POA: Diagnosis not present

## 2023-08-16 DIAGNOSIS — I700 Atherosclerosis of aorta: Secondary | ICD-10-CM

## 2023-08-16 DIAGNOSIS — G8929 Other chronic pain: Secondary | ICD-10-CM

## 2023-08-16 DIAGNOSIS — Z0000 Encounter for general adult medical examination without abnormal findings: Secondary | ICD-10-CM | POA: Diagnosis not present

## 2023-08-16 DIAGNOSIS — R109 Unspecified abdominal pain: Secondary | ICD-10-CM | POA: Diagnosis not present

## 2023-08-16 DIAGNOSIS — I714 Abdominal aortic aneurysm, without rupture, unspecified: Secondary | ICD-10-CM | POA: Diagnosis not present

## 2023-08-16 DIAGNOSIS — G912 (Idiopathic) normal pressure hydrocephalus: Secondary | ICD-10-CM

## 2023-08-16 DIAGNOSIS — R4189 Other symptoms and signs involving cognitive functions and awareness: Secondary | ICD-10-CM

## 2023-08-16 DIAGNOSIS — I480 Paroxysmal atrial fibrillation: Secondary | ICD-10-CM | POA: Diagnosis not present

## 2023-08-16 DIAGNOSIS — Z87898 Personal history of other specified conditions: Secondary | ICD-10-CM

## 2023-08-16 LAB — BASIC METABOLIC PANEL WITH GFR
BUN: 20 mg/dL (ref 6–23)
CO2: 27 meq/L (ref 19–32)
Calcium: 9.3 mg/dL (ref 8.4–10.5)
Chloride: 106 meq/L (ref 96–112)
Creatinine, Ser: 0.77 mg/dL (ref 0.40–1.50)
GFR: 83.91 mL/min (ref >60.00)
Glucose, Bld: 98 mg/dL (ref 70–99)
Potassium: 4.2 meq/L (ref 3.5–5.1)
Sodium: 142 meq/L (ref 135–145)

## 2023-08-16 LAB — LIPID PANEL
Cholesterol: 167 mg/dL (ref 0–200)
HDL: 31.6 mg/dL — ABNORMAL LOW (ref >39.00)
NonHDL: 135.31
Total CHOL/HDL Ratio: 5
Triglycerides: 405 mg/dL — ABNORMAL HIGH (ref 0.0–149.0)
VLDL: 81 mg/dL — ABNORMAL HIGH (ref 0.0–40.0)

## 2023-08-16 LAB — HEMOGLOBIN A1C: Hgb A1c MFr Bld: 5.6 % (ref 4.6–6.5)

## 2023-08-16 LAB — LDL CHOLESTEROL, DIRECT: Direct LDL: 64 mg/dL

## 2023-08-16 NOTE — Assessment & Plan Note (Signed)
 Reduced. Reviewed VAS [place] AAA from February 2025. Repeat in November 2025. Follow with vascular services.

## 2023-08-16 NOTE — Assessment & Plan Note (Addendum)
 Repeat lipid panel pending. Patient discontinued atorvastatin 20 mg months ago.  Consider Zetia 10 mg daily per cardiology notes.  Await results

## 2023-08-16 NOTE — Assessment & Plan Note (Addendum)
 Stable.   Following with vascular services and neurology. CT head results pending.

## 2023-08-16 NOTE — Assessment & Plan Note (Signed)
 Rate and rhythm regular today. Following with cardiology, office notes reviewed from March 2025.  Continue metoprolol succinate 50 mg daily, apixaban 5 mg twice daily.

## 2023-08-16 NOTE — Assessment & Plan Note (Signed)
 It's possible that his symptoms are secondary to diverticulitis. Discussed that I would like to capture his symptoms on CT scan. He will notify us when he has another flare.

## 2023-08-16 NOTE — Assessment & Plan Note (Signed)
 Reviewed sleep study report from 2024, moderate to severe apnea, recommendation was for CPAP. He declines CPAP machine for now but will think about it.

## 2023-08-16 NOTE — Assessment & Plan Note (Addendum)
 Deteriorated per wife.  Following with neurology. Reviewed office notes from April 2024 through [facility]. Continue Namenda 5 mg BID.   Offered to resume Lexapro, he declines.

## 2023-08-16 NOTE — Assessment & Plan Note (Signed)
 Continue atorvastatin 20 mg daily. Consider addition of Zetia 10 mg daily.  Repeat lipid panel pending.

## 2023-08-16 NOTE — Assessment & Plan Note (Signed)
 Repeat A1C pending.

## 2023-08-16 NOTE — Assessment & Plan Note (Signed)
 Controlled.  Continue 20 mg daily.

## 2023-08-16 NOTE — Progress Notes (Signed)
 Subjective:    Patient ID: Adam Juarez, male    DOB: 1941/05/10, 83 y.o.   MRN: [id number]  HPI  Adam Juarez is a very pleasant 83 y.o. male who presents today for complete physical and follow up of chronic conditions.  His wife joins [place] today.  He would also like to discuss chronic diverticulosis and diverticulitis. Symptoms occur every 2-3 months, last episode was 2 weeks ago with right lower quadrant pain and left lower quadrant pain. As soon as he notices his symptoms he will start a clear liquid diet for 1 day followed by a BRAT diet. He underwent colonoscopy in 2022 which showed sigmoid diverticulosis. He has never seen GI. His last episode occurred 2 weeks ago, lasted a few days.   Immunizations: -Tetanus: Completed in 2024 -Shingles: Completed zostavax -Pneumonia: Completed Prevnar 20 in 2024  Diet: Fair diet.  Exercise: No regular exercise.  Eye exam: Completes annually  Dental exam: Completes semi-annually    Colonoscopy: Completed in 2022, no further screening needed given age.  PSA: Due in May 2025, follows with Urology  BP Readings from Last 3 Encounters:  08/16/23 114/70  08/06/23 120/62  07/12/23 117/78        Review of Systems  Constitutional:  Negative for unexpected weight change.  HENT:  Negative for rhinorrhea.   Respiratory:  Negative for cough and shortness of breath.   Cardiovascular:  Negative for chest pain.  Gastrointestinal:  Negative for constipation and diarrhea.  Genitourinary:  Negative for difficulty urinating.  Musculoskeletal:  Positive for arthralgias and back pain.  Skin:  Negative for rash.  Allergic/Immunologic: Negative for environmental allergies.  Neurological:  Positive for numbness. Negative for dizziness and headaches.  Psychiatric/Behavioral:  Positive for confusion.          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 a-fib x 1 episode patient states he "auto corrected" seen at [facility]   Foul smelling urine 06/22/2022   GERD (gastroesophageal reflux disease)    Headache    poor posture - none recently   History of kidney stones    Hydrocephalus (HCC)    Hyperlipidemia    Neuropathy    PSVT (paroxysmal supraventricular tachycardia) (HCC) 2009   Controlled with "breathing process"   Renal stone 9/08, 6/15   Wears dentures    full upper    Social History   Socioeconomic History   Marital status: Married    Spouse name: Not on file   Number of children: Not on file   Years of education: Not on file   Highest education level: Associate degree: academic program  Occupational History   Not on file  Tobacco Use   Smoking status: Former    Current packs/day: 0.00    Average packs/day: 0.5 packs/day for 38.0 years (19.0 ttl pk-yrs)    Types: Cigarettes    Start date: 07/14/1959    Quit date: 07/13/1997    Years since quitting: 26.1   Smokeless tobacco: Never  Vaping Use   Vaping status: Never Used  Substance and Sexual Activity   Alcohol use: No   Drug use: No   Sexual activity: Not on file  Other Topics Concern   Not on file  Social History Narrative   Not on file   Social Drivers of  Health   Financial Resource Strain: Low Risk  (08/15/2023)   Overall Financial Resource Strain (CARDIA)    Difficulty of Paying Living Expenses: Not hard at all  Food Insecurity: No Food Insecurity (08/15/2023)   Hunger Vital Sign    Worried About Running Out of Food in the Last Year: Never true    Ran Out of Food in the Last Year: Never true  Transportation Needs: No Transportation Needs (08/15/2023)   PRAPARE - Administrator, Civil Service (Medical): No    Lack of Transportation (Non-Medical): No  Physical Activity: Inactive (08/15/2023)   Exercise Vital Sign    Days of Exercise per Week: 3 days    Minutes of Exercise per Session: 0 min   Stress: Stress Concern Present (08/15/2023)   [employer] of Occupational Health - Occupational Stress Questionnaire    Feeling of Stress : To some extent  Social Connections: Moderately Integrated (08/15/2023)   Social Connection and Isolation Panel [NHANES]    Frequency of Communication with Friends and Family: Never    Frequency of Social Gatherings with Friends and Family: Twice a week    Attends Religious Services: More than 4 times per year    Active Member of [employer] or Organizations: Yes    Attends Banker Meetings: More than 4 times per year    Marital Status: Married  Catering manager Violence: Not At Risk (06/13/2023)   Humiliation, Afraid, Rape, and Kick questionnaire    Fear of Current or Ex-Partner: No    Emotionally Abused: No    Physically Abused: No    Sexually Abused: No    Past Surgical History:  Procedure Laterality Date   CARDIAC CATHETERIZATION  2008   [place], [place]   CATARACT EXTRACTION W/PHACO Left 07/27/2015   Procedure: CATARACT EXTRACTION PHACO AND INTRAOCULAR LENS PLACEMENT (IOC);  Surgeon: Lockie Mola, MD;  Location: [facility];  Service: Ophthalmology;  Laterality: Left;  TORIC   CATARACT EXTRACTION W/PHACO Right 08/24/2015   Procedure: CATARACT EXTRACTION PHACO AND INTRAOCULAR LENS PLACEMENT (IOC);  Surgeon: Lockie Mola, MD;  Location: [facility];  Service: Ophthalmology;  Laterality: Right;  TORIC   COLONOSCOPY WITH PROPOFOL N/A 08/10/2020   Procedure: COLONOSCOPY WITH PROPOFOL;  Surgeon: Toledo, Boykin Nearing, MD;  Location: [facility];  Service: Gastroenterology;  Laterality: N/A;   HEMORROIDECTOMY  2010   banding   IR ANGIO INTRA EXTRACRAN SEL COM CAROTID INNOMINATE BILAT MOD SED  10/30/2019   IR ANGIO VERTEBRAL SEL VERTEBRAL UNI R MOD SED  10/30/2019   IR RADIOLOGIST EVAL & MGMT  04/11/2020   IR US GUIDE VASC ACCESS RIGHT  10/30/2019   KIDNEY STONE SURGERY  9/08, 6/15   lithotripsy   LAPAROSCOPIC  REVISION VENTRICULAR-PERITONEAL (V-P) SHUNT  01/08/2020   Procedure: LAPAROSCOPIC REVISION VENTRICULAR-PERITONEAL (V-P) SHUNT;  Surgeon: Lisbeth Renshaw, MD;  Location: [facility] OR;  Service: Neurosurgery;;   PROSTATE BIOPSY  2007   SKIN CANCER EXCISION     TOENAIL TRIMMING  11/2017   Dr. Orland Jarred   TONSILLECTOMY     VASECTOMY     VENTRICULOPERITONEAL SHUNT Right 01/08/2020   Procedure: SHUNT INSERTION VENTRICULAR-PERITONEAL;  Surgeon: Lisbeth Renshaw, MD;  Location: [facility] OR;  Service: Neurosurgery;  Laterality: Right;    Family History  Problem Relation Age of Onset   AAA (abdominal aortic aneurysm) Father    Parkinson's disease Mother    Arthritis Mother     Allergies  Allergen Reactions   Bee Venom Anaphylaxis and  Hives   Other Other (See Comments)    Pistachios - tickles throat  Pistachios - tickles throat   Tramadol Other (See Comments)    Current Outpatient Medications on File Prior to Visit  Medication Sig Dispense Refill   acetaminophen (TYLENOL) 500 MG tablet Take 1,000 mg by mouth every 8 (eight) hours as needed for moderate pain.     ascorbic acid (VITAMIN C) 500 MG tablet Take 500 mg by mouth daily.     CALCIUM-VITAMIN D PO Take 600-800 mg by mouth daily.     ELIQUIS 5 MG TABS tablet Take 5 mg by mouth 2 (two) times daily.     Magnesium Oxide 500 MG TABS Take 500 mg by mouth daily.     memantine (NAMENDA) 5 MG tablet Take 5 mg by mouth 2 (two) times daily.     metoprolol succinate (TOPROL-XL) 50 MG 24 hr tablet Take 50 mg in the morning, and 25 mg in the evening. 180 tablet 3   Multiple Vitamins-Minerals (PRESERVISION AREDS 2 PO) Take 1 capsule by mouth 2 (two) times daily.      omeprazole (PRILOSEC) 20 MG capsule Take 20 mg by mouth daily.     Potassium Gluconate 550 MG TABS Take 550 mg by mouth daily.     Probiotic Product (PROBIOTIC DAILY PO) Take 1 capsule by mouth daily.      vitamin B-12 (CYANOCOBALAMIN) 1000 MCG tablet Take 1,000 mcg by mouth daily.     No  current facility-administered medications on file prior to visit.    BP 114/70 (BP Location: Left Arm, Patient Position: Sitting, Cuff Size: Large)   Pulse 68   Temp 98.3 F (36.8 C) (Oral)   Ht 5\' 10"  (1.778 m) Comment: with shoes  Wt 194 lb (88 kg)   SpO2 96%   BMI 27.84 kg/m  Objective:   Physical Exam HENT:     Right Ear: Tympanic membrane and ear canal normal.     Left Ear: Tympanic membrane and ear canal normal.  Eyes:     Pupils: Pupils are equal, round, and reactive to light.  Cardiovascular:     Rate and Rhythm: Normal rate and regular rhythm.  Pulmonary:     Effort: Pulmonary effort is normal.     Breath sounds: Normal breath sounds.  Abdominal:     General: Bowel sounds are normal.     Palpations: Abdomen is soft.     Tenderness: There is no abdominal tenderness.  Musculoskeletal:        General: Normal range of motion.     Cervical back: Neck supple.  Skin:    General: Skin is warm and dry.  Neurological:     Mental Status: He is alert and oriented to person, place, and time.     Cranial Nerves: No cranial nerve deficit.     Deep Tendon Reflexes:     Reflex Scores:      Patellar reflexes are 2+ on the right side and 2+ on the left side. Psychiatric:        Mood and Affect: Mood normal.           Assessment & Plan:  Preventative health care Assessment & Plan: Immunizations UTD. Discussed Shingrix Colonoscopy UTD, no further imaging needed PSA due in May  Discussed the importance of a healthy diet and regular exercise in order for weight loss, and to reduce the risk of further co-morbidity.  Exam stable. Labs pending.  Follow up in 1 year  for repeat physical.    Abdominal aortic aneurysm (AAA) without rupture, unspecified part (HCC) Assessment & Plan: Reduced. Reviewed VAS [place] AAA from February 2025. Repeat in November 2025. Follow with vascular services.   Aortic atherosclerosis (HCC) Assessment & Plan: Continue atorvastatin 20 mg  daily. Consider addition of Zetia 10 mg daily.  Repeat lipid panel pending.   Paroxysmal atrial fibrillation (HCC) Assessment & Plan: Rate and rhythm regular today. Following with cardiology, office notes reviewed from March 2025.  Continue metoprolol succinate 50 mg daily, apixaban 5 mg twice daily.   History of elevated PSA Assessment & Plan: Following with Urology, office notes reviewed from January 2025. Repeat PSA in May 2025. If PSA is at baseline, then discontinue PSA screening.   Mixed hyperlipidemia Assessment & Plan: Repeat lipid panel pending. Patient discontinued atorvastatin 20 mg months ago.  Consider Zetia 10 mg daily per cardiology notes.  Await results  Orders: -     Basic metabolic panel with GFR -     Lipid panel  Prediabetes Assessment & Plan: Repeat A1C pending.  Orders: -     Hemoglobin A1c  Cognitive impairment Assessment & Plan: Deteriorated per wife.  Following with neurology. Reviewed office notes from April 2024 through [facility]. Continue Namenda 5 mg BID.   Offered to resume Lexapro, he declines.   Normal pressure hydrocephalus (HCC) Assessment & Plan: Stable.   Following with vascular services and neurology. CT head results pending.    Gastroesophageal reflux disease, unspecified whether esophagitis present Assessment & Plan: Controlled.  Continue 20 mg daily.    Need for Td vaccine  Chronic abdominal pain Assessment & Plan: It's possible that his symptoms are secondary to diverticulitis. Discussed that I would like to capture his symptoms on CT scan. He will notify us when he has another flare.    OSA (obstructive sleep apnea) Assessment & Plan: Reviewed sleep study report from 2024, moderate to severe apnea, recommendation was for CPAP. He declines CPAP machine for now but will think about it.         [name], NP

## 2023-08-16 NOTE — Assessment & Plan Note (Addendum)
 Following with Urology, office notes reviewed from January 2025. Repeat PSA in May 2025. If PSA is at baseline, then discontinue PSA screening.

## 2023-08-16 NOTE — Assessment & Plan Note (Signed)
 Immunizations UTD. Discussed Shingrix Colonoscopy UTD, no further imaging needed PSA due in May  Discussed the importance of a healthy diet and regular exercise in order for weight loss, and to reduce the risk of further co-morbidity.  Exam stable. Labs pending.  Follow up in 1 year for repeat physical.

## 2023-08-27 DIAGNOSIS — G912 (Idiopathic) normal pressure hydrocephalus: Secondary | ICD-10-CM | POA: Diagnosis not present

## 2023-09-02 DIAGNOSIS — G4733 Obstructive sleep apnea (adult) (pediatric): Secondary | ICD-10-CM | POA: Diagnosis not present

## 2023-09-02 DIAGNOSIS — Z982 Presence of cerebrospinal fluid drainage device: Secondary | ICD-10-CM | POA: Diagnosis not present

## 2023-09-02 DIAGNOSIS — G912 (Idiopathic) normal pressure hydrocephalus: Secondary | ICD-10-CM | POA: Diagnosis not present

## 2023-09-02 DIAGNOSIS — R569 Unspecified convulsions: Secondary | ICD-10-CM | POA: Diagnosis not present

## 2023-09-25 ENCOUNTER — Other Ambulatory Visit: Payer: Self-pay | Admitting: Primary Care

## 2023-09-25 DIAGNOSIS — R972 Elevated prostate specific antigen [PSA]: Secondary | ICD-10-CM

## 2023-09-26 ENCOUNTER — Other Ambulatory Visit (INDEPENDENT_AMBULATORY_CARE_PROVIDER_SITE_OTHER)

## 2023-09-26 ENCOUNTER — Other Ambulatory Visit: Payer: Self-pay

## 2023-09-26 DIAGNOSIS — R972 Elevated prostate specific antigen [PSA]: Secondary | ICD-10-CM | POA: Diagnosis not present

## 2023-09-27 DIAGNOSIS — H353221 Exudative age-related macular degeneration, left eye, with active choroidal neovascularization: Secondary | ICD-10-CM | POA: Diagnosis not present

## 2023-09-30 ENCOUNTER — Encounter: Payer: Self-pay | Admitting: Family Medicine

## 2023-09-30 ENCOUNTER — Ambulatory Visit (INDEPENDENT_AMBULATORY_CARE_PROVIDER_SITE_OTHER): Admitting: Family Medicine

## 2023-09-30 VITALS — BP 110/70 | HR 63 | Temp 98.2°F | Ht 70.0 in | Wt 193.5 lb

## 2023-09-30 DIAGNOSIS — B3789 Other sites of candidiasis: Secondary | ICD-10-CM | POA: Diagnosis not present

## 2023-09-30 NOTE — Progress Notes (Signed)
     [name] T. Lindsey Hommel, MD, CAQ Sports Medicine [facility] [address] [place], [address]  Phone: [phone]  FAX: [phone]  Adam Juarez - 83 y.o. male  MRN [id number]  Date of Birth: 1941/04/22  Date: 09/30/2023  PCP: Gabriel John, NP  Referral: Gabriel John, NP  Chief Complaint  Patient presents with   Groin Itch   Subjective:   Adam Juarez is a 83 y.o. very pleasant male patient with Body mass index is 27.76 kg/m. who presents with the following:  He presents with a rash in the groin that has been present for a little bit more than a week.  Initially it was more of a reddish in appearance.  It is not hurting and is not itching.  It is a little bit uncomfortable vaguely.  He initially put some alcohol on it, which made it hurt worse and get some pain.  He has put some hydrocortisone  1% topically on it, which does seem to calm it down a bit.  Rash in perineum -  One week  Really red and inflammed -   Review of Systems is noted in the HPI, as appropriate  Objective:   BP 110/70   Pulse 63   Temp 98.2 F (36.8 C) (Temporal)   Ht 5\' 10"  (1.778 m)   Wt 193 lb 8 oz (87.8 kg)   SpO2 96%   BMI 27.76 kg/m   GEN: No acute distress; alert,appropriate. PULM: Breathing comfortably in no respiratory distress PSYCH: Normally interactive.   Macular rash with clear demarcated borders, pinkish  Laboratory and Imaging Data:  Assessment and Plan:     ICD-10-CM   1. Candida rash of groin  B37.89      Consistent with classic Candida.  Treat with clotrimazole.  If symptoms persist we could switch to nystatin powder as well.  Patient Instructions  Clotrimazole cream, apply twice a day   [employer] One speech-to-text software was used for transcription in this dictation.  Possible transcriptional errors can occur using Animal nutritionist.   Signed,  Ranny Bye. Keylan Costabile, MD   Outpatient Encounter Medications  as of 09/30/2023  Medication Sig   acetaminophen  (TYLENOL ) 500 MG tablet Take 1,000 mg by mouth every 8 (eight) hours as needed for moderate pain.   ascorbic acid (VITAMIN C) 500 MG tablet Take 500 mg by mouth daily.   CALCIUM -VITAMIN D PO Take 600-800 mg by mouth daily.   ELIQUIS  5 MG TABS tablet Take 5 mg by mouth 2 (two) times daily.   Magnesium Oxide 500 MG TABS Take 500 mg by mouth daily.   memantine (NAMENDA) 5 MG tablet Take 5 mg by mouth 2 (two) times daily.   metoprolol  succinate (TOPROL -XL) 50 MG 24 hr tablet Take 50 mg in the morning, and 25 mg in the evening.   Multiple Vitamins-Minerals (PRESERVISION AREDS 2 PO) Take 1 capsule by mouth 2 (two) times daily.    omeprazole  (PRILOSEC) 20 MG capsule Take 20 mg by mouth daily.   Potassium Gluconate 550 MG TABS Take 550 mg by mouth daily.   Probiotic Product (PROBIOTIC DAILY PO) Take 1 capsule by mouth daily.    vitamin B-12 (CYANOCOBALAMIN ) 1000 MCG tablet Take 1,000 mcg by mouth daily.   No facility-administered encounter medications on file as of 09/30/2023.

## 2023-09-30 NOTE — Patient Instructions (Signed)
Clotrimazole cream, apply twice a day

## 2023-10-01 ENCOUNTER — Ambulatory Visit: Payer: Self-pay | Admitting: Primary Care

## 2023-10-01 ENCOUNTER — Encounter (INDEPENDENT_AMBULATORY_CARE_PROVIDER_SITE_OTHER): Payer: Self-pay

## 2023-10-01 DIAGNOSIS — T63441A Toxic effect of venom of bees, accidental (unintentional), initial encounter: Secondary | ICD-10-CM

## 2023-10-01 LAB — PSA: PSA: 4.71 ng/mL — ABNORMAL HIGH (ref 0.10–4.00)

## 2023-10-02 ENCOUNTER — Encounter: Payer: Self-pay | Admitting: Urology

## 2023-10-22 MED ORDER — EPINEPHRINE 0.3 MG/0.3ML IJ SOAJ: 0.3000 mg | INTRAMUSCULAR | 0 refills | Status: AC | PRN

## 2023-11-04 DIAGNOSIS — H353221 Exudative age-related macular degeneration, left eye, with active choroidal neovascularization: Secondary | ICD-10-CM | POA: Diagnosis not present

## 2023-11-09 ENCOUNTER — Encounter (HOSPITAL_COMMUNITY): Payer: Self-pay | Admitting: Interventional Radiology

## 2023-11-27 DIAGNOSIS — K219 Gastro-esophageal reflux disease without esophagitis: Secondary | ICD-10-CM

## 2023-11-27 MED ORDER — OMEPRAZOLE 20 MG PO CPDR: 20.0000 mg | DELAYED_RELEASE_CAPSULE | Freq: Every day | ORAL | 1 refills | Status: AC

## 2023-12-03 ENCOUNTER — Encounter: Payer: Self-pay | Admitting: Cardiovascular Disease

## 2023-12-23 DIAGNOSIS — H353221 Exudative age-related macular degeneration, left eye, with active choroidal neovascularization: Secondary | ICD-10-CM | POA: Diagnosis not present

## 2023-12-31 ENCOUNTER — Other Ambulatory Visit

## 2024-01-07 ENCOUNTER — Other Ambulatory Visit

## 2024-01-07 DIAGNOSIS — R972 Elevated prostate specific antigen [PSA]: Secondary | ICD-10-CM

## 2024-01-08 DIAGNOSIS — R569 Unspecified convulsions: Secondary | ICD-10-CM | POA: Diagnosis not present

## 2024-01-08 LAB — PSA: Prostate Specific Ag, Serum: 5.7 ng/mL — ABNORMAL HIGH (ref 0.0–4.0)

## 2024-01-10 ENCOUNTER — Ambulatory Visit (INDEPENDENT_AMBULATORY_CARE_PROVIDER_SITE_OTHER): Payer: Medicare Other | Admitting: Vascular Surgery

## 2024-01-10 ENCOUNTER — Ambulatory Visit (INDEPENDENT_AMBULATORY_CARE_PROVIDER_SITE_OTHER): Payer: Medicare Other

## 2024-01-10 ENCOUNTER — Encounter (INDEPENDENT_AMBULATORY_CARE_PROVIDER_SITE_OTHER): Payer: Self-pay | Admitting: Vascular Surgery

## 2024-01-10 VITALS — BP 136/84 | HR 54 | Ht 70.0 in | Wt 187.0 lb

## 2024-01-10 DIAGNOSIS — I714 Abdominal aortic aneurysm, without rupture, unspecified: Secondary | ICD-10-CM

## 2024-01-10 DIAGNOSIS — R7303 Prediabetes: Secondary | ICD-10-CM | POA: Diagnosis not present

## 2024-01-10 DIAGNOSIS — G912 (Idiopathic) normal pressure hydrocephalus: Secondary | ICD-10-CM

## 2024-01-10 NOTE — Assessment & Plan Note (Signed)
 Abdominal aortic aneurysm is increased to 4.56 cm in maximal diameter on duplex today.  This was 4.3 cm 1 year ago.  This is still below the threshold for prophylactic repair, but has enlarged.  We will continue to follow this on 27-month intervals.  If he continues to have growth, we will need to consider CT angiogram for planning for surgery.  5 cm would generally be the general threshold for prophylactic repair to avoid lethal rupture.

## 2024-01-10 NOTE — Progress Notes (Signed)
 MRN : [id number]  Adam Juarez is a 83 y.o. ([birth year]) male who presents with chief complaint of  Chief Complaint  Patient presents with   Follow-up    Follow up 6 month follow up   .  History of Present Illness: Patient returns today in follow up of his abdominal aortic aneurysm.  He is doing well.  He denies any aneurysm related symptoms. Specifically, the patient denies new back or abdominal pain, or signs of peripheral embolization. Abdominal aortic aneurysm is increased to 4.56 cm in maximal diameter on duplex today.  This was 4.3 cm 1 year ago.  This is still below the threshold for prophylactic repair, but has enlarged.  Current Outpatient Medications  Medication Sig Dispense Refill   acetaminophen  (TYLENOL ) 500 MG tablet Take 1,000 mg by mouth every 8 (eight) hours as needed for moderate pain.     ascorbic acid (VITAMIN C) 500 MG tablet Take 500 mg by mouth daily.     CALCIUM -VITAMIN D PO Take 600-800 mg by mouth daily.     ELIQUIS  5 MG TABS tablet Take 5 mg by mouth 2 (two) times daily.     EPINEPHrine  0.3 mg/0.3 mL IJ SOAJ injection Inject 0.3 mg into the muscle as needed for anaphylaxis. 1 each 0   Magnesium Oxide 500 MG TABS Take 500 mg by mouth daily.     memantine (NAMENDA) 5 MG tablet Take 5 mg by mouth 2 (two) times daily.     metoprolol  succinate (TOPROL -XL) 50 MG 24 hr tablet Take 50 mg in the morning, and 25 mg in the evening. 180 tablet 3   Multiple Vitamins-Minerals (PRESERVISION AREDS 2 PO) Take 1 capsule by mouth 2 (two) times daily.      omeprazole  (PRILOSEC) 20 MG capsule Take 1 capsule (20 mg total) by mouth daily. for heartburn. 90 capsule 1   Potassium Gluconate 550 MG TABS Take 550 mg by mouth daily.     Probiotic Product (PROBIOTIC DAILY PO) Take 1 capsule by mouth daily.      vitamin B-12 (CYANOCOBALAMIN ) 1000 MCG tablet Take 1,000 mcg by mouth daily.     No current facility-administered medications for this visit.    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 a-fib x 1 episode patient states he auto corrected seen at [facility]   Foul smelling urine 06/22/2022   GERD (gastroesophageal reflux disease)    Headache    poor posture - none recently   History of kidney stones    Hydrocephalus (HCC)    Hyperlipidemia    Neuropathy    PSVT (paroxysmal supraventricular tachycardia) (HCC) 2009   Controlled with breathing process   Renal stone 9/08, 6/15   Wears dentures    full upper    Past Surgical History:  Procedure Laterality Date   CARDIAC CATHETERIZATION  2008   [place], [place]   CATARACT EXTRACTION [facility] Left 07/27/2015   Procedure: CATARACT EXTRACTION PHACO AND INTRAOCULAR LENS PLACEMENT (IOC);  Surgeon: Dene Etienne, MD;  Location: [facility];  Service: Ophthalmology;  Laterality: Left;  TORIC   CATARACT EXTRACTION W/PHACO Right 08/24/2015   Procedure: CATARACT EXTRACTION PHACO AND INTRAOCULAR LENS PLACEMENT (IOC);  Surgeon: Dene Etienne, MD;  Location: [facility];  Service: Ophthalmology;  Laterality: Right;  TORIC  COLONOSCOPY WITH PROPOFOL  N/A 08/10/2020   Procedure: COLONOSCOPY WITH PROPOFOL ;  Surgeon: Toledo, Ladell POUR, MD;  Location: [facility] ENDOSCOPY;  Service: Gastroenterology;  Laterality: N/A;   HEMORROIDECTOMY  2010   banding   IR ANGIO INTRA EXTRACRAN SEL COM CAROTID INNOMINATE BILAT MOD SED  10/30/2019   IR ANGIO VERTEBRAL SEL VERTEBRAL UNI R MOD SED  10/30/2019   IR RADIOLOGIST EVAL & MGMT  04/11/2020   IR US  GUIDE VASC ACCESS RIGHT  10/30/2019   KIDNEY STONE SURGERY  9/08, 6/15   lithotripsy   LAPAROSCOPIC REVISION VENTRICULAR-PERITONEAL (V-P) SHUNT  01/08/2020   Procedure: LAPAROSCOPIC REVISION VENTRICULAR-PERITONEAL (V-P) SHUNT;  Surgeon: Lanis Pupa, MD;  Location: [facility] OR;  Service: Neurosurgery;;   PROSTATE BIOPSY  2007    SKIN CANCER EXCISION     TOENAIL TRIMMING  11/2017   Dr. Lilli   TONSILLECTOMY     VASECTOMY     VENTRICULOPERITONEAL SHUNT Right 01/08/2020   Procedure: SHUNT INSERTION VENTRICULAR-PERITONEAL;  Surgeon: Lanis Pupa, MD;  Location: [facility] OR;  Service: Neurosurgery;  Laterality: Right;     Social History   Tobacco Use   Smoking status: Former    Current packs/day: 0.00    Average packs/day: 0.5 packs/day for 38.0 years (19.0 ttl pk-yrs)    Types: Cigarettes    Start date: 07/14/1959    Quit date: 07/13/1997    Years since quitting: 26.5   Smokeless tobacco: Never  Vaping Use   Vaping status: Never Used  Substance Use Topics   Alcohol use: No   Drug use: No      Family History  Problem Relation Age of Onset   AAA (abdominal aortic aneurysm) Father    Parkinson's disease Mother    Arthritis Mother      Allergies  Allergen Reactions   Bee Venom Anaphylaxis and Hives   Other Other (See Comments)    Pistachios - tickles throat  Pistachios - tickles throat   Tramadol Other (See Comments)    REVIEW OF SYSTEMS (Negative unless checked)   Constitutional: [] Weight loss  [] Fever  [] Chills Cardiac: [] Chest pain   [] Chest pressure   [] Palpitations   [] Shortness of breath when laying flat   [] Shortness of breath at rest   [] Shortness of breath with exertion. Vascular:  [] Pain in legs with walking   [] Pain in legs at rest   [] Pain in legs when laying flat   [] Claudication   [] Pain in feet when walking  [] Pain in feet at rest  [] Pain in feet when laying flat   [] History of DVT   [] Phlebitis   [] Swelling in legs   [] Varicose veins   [] Non-healing ulcers Pulmonary:   [] Uses home oxygen   [] Productive cough   [] Hemoptysis   [] Wheeze  [] COPD   [] Asthma Neurologic:  [x] Dizziness  [] Blackouts   [] Seizures   [] History of stroke   [] History of TIA  [] Aphasia   [] Temporary blindness   [] Dysphagia   [] Weakness or numbness in arms   [] Weakness or numbness in legs Musculoskeletal:   [x] Arthritis   [] Joint swelling   [x] Joint pain   [] Low back pain Hematologic:  [] Easy bruising  [] Easy bleeding   [] Hypercoagulable state   [] Anemic  [] Hepatitis Gastrointestinal:  [] Blood in stool   [] Vomiting blood  [x] Gastroesophageal reflux/heartburn   [] Abdominal pain Genitourinary:  [] Chronic kidney disease   [] Difficult urination  [] Frequent urination  [] Burning with urination   [] Hematuria Skin:  [] Rashes   [] Ulcers   [] Wounds Psychological:  [] History of anxiety   []   History of major depression.  Physical Examination  BP 136/84   Pulse (!) 54   Ht 5' 10 (1.778 m)   Wt 187 lb (84.8 kg)   BMI 26.83 kg/m  Gen:  WD/WN, NAD. Appears younger than stated age. Head: [place]/AT, No temporalis wasting. Ear/Nose/Throat: Hearing grossly intact, nares w/o erythema or drainage Eyes: Conjunctiva clear. Sclera non-icteric Neck: Supple.  Trachea midline Pulmonary:  Good air movement, no use of accessory muscles.  Cardiac: bradycardic Vascular:  Vessel Right Left  Radial Palpable Palpable                          PT Palpable Palpable  DP Palpable Palpable   Gastrointestinal: soft, non-tender/non-distended. No guarding/reflex.  Musculoskeletal: M/S 5/5 throughout.  No deformity or atrophy. No edema. Neurologic: Sensation grossly intact in extremities.  Symmetrical.  Speech is fluent.  Psychiatric: Judgment intact, Mood & affect appropriate for pt's clinical situation. Dermatologic: No rashes or ulcers noted.  No cellulitis or open wounds.      Labs Recent Results (from the past 2160 hours)  PSA     Status: Abnormal   Collection Time: 01/07/24  1:32 PM  Result Value Ref Range   Prostate Specific Ag, Serum 5.7 (H) 0.0 - 4.0 ng/mL    Comment: Roche ECLIA methodology. According to the American Urological Association, Serum PSA should decrease and remain at undetectable levels after radical prostatectomy. The AUA defines biochemical recurrence as an initial PSA value 0.2 ng/mL or  greater followed by a subsequent confirmatory PSA value 0.2 ng/mL or greater. Values obtained with different assay methods or kits cannot be used interchangeably. Results cannot be interpreted as absolute evidence of the presence or absence of malignant disease.     Radiology No results found.  Assessment/Plan  AAA (abdominal aortic aneurysm) (HCC) Abdominal aortic aneurysm is increased to 4.56 cm in maximal diameter on duplex today.  This was 4.3 cm 1 year ago.  This is still below the threshold for prophylactic repair, but has enlarged.  We will continue to follow this on 45-month intervals.  If he continues to have growth, we will need to consider CT angiogram for planning for surgery.  5 cm would generally be the general threshold for prophylactic repair to avoid lethal rupture.  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  No worrisome symptoms currently.   Prediabetes blood glucose control important in reducing the progression of atherosclerotic disease. Also, involved in wound healing. On appropriate medications.  Selinda Gu, MD  01/10/2024 10:54 AM    This note was created with Dragon medical transcription system.  Any errors from dictation are purely unintentional

## 2024-01-12 ENCOUNTER — Ambulatory Visit: Payer: Self-pay | Admitting: Urology

## 2024-01-15 DIAGNOSIS — M79675 Pain in left toe(s): Secondary | ICD-10-CM | POA: Diagnosis not present

## 2024-01-15 DIAGNOSIS — B351 Tinea unguium: Secondary | ICD-10-CM | POA: Diagnosis not present

## 2024-01-15 DIAGNOSIS — M79674 Pain in right toe(s): Secondary | ICD-10-CM | POA: Diagnosis not present

## 2024-01-15 DIAGNOSIS — L851 Acquired keratosis [keratoderma] palmaris et plantaris: Secondary | ICD-10-CM | POA: Diagnosis not present

## 2024-02-06 DIAGNOSIS — L570 Actinic keratosis: Secondary | ICD-10-CM | POA: Diagnosis not present

## 2024-02-06 DIAGNOSIS — Z85828 Personal history of other malignant neoplasm of skin: Secondary | ICD-10-CM | POA: Diagnosis not present

## 2024-02-06 DIAGNOSIS — L821 Other seborrheic keratosis: Secondary | ICD-10-CM | POA: Diagnosis not present

## 2024-02-06 DIAGNOSIS — Z08 Encounter for follow-up examination after completed treatment for malignant neoplasm: Secondary | ICD-10-CM | POA: Diagnosis not present

## 2024-02-24 ENCOUNTER — Encounter: Payer: Self-pay | Admitting: Urology

## 2024-02-24 DIAGNOSIS — H353231 Exudative age-related macular degeneration, bilateral, with active choroidal neovascularization: Secondary | ICD-10-CM | POA: Diagnosis not present

## 2024-02-24 DIAGNOSIS — Z961 Presence of intraocular lens: Secondary | ICD-10-CM | POA: Diagnosis not present

## 2024-02-26 ENCOUNTER — Encounter: Payer: Self-pay | Admitting: Cardiovascular Disease

## 2024-02-26 MED ORDER — ELIQUIS 5 MG PO TABS: 5.0000 mg | ORAL_TABLET | Freq: Two times a day (BID) | ORAL | 1 refills | Status: DC

## 2024-03-16 ENCOUNTER — Ambulatory Visit: Payer: Self-pay

## 2024-03-16 NOTE — Telephone Encounter (Signed)
 Noted

## 2024-03-16 NOTE — Telephone Encounter (Signed)
 FYI Only or Action Required?: FYI only for provider: appointment scheduled on 03/18/2024.  Patient was last seen in primary care on 09/30/2023 by Watt Mirza, MD.  Called Nurse Triage reporting Abdominal Pain.  Symptoms began several weeks ago.  Interventions attempted: Nothing.  Symptoms are: gradually worsening.  Triage Disposition: See Physician Within 24 Hours  Patient/caregiver understands and will follow disposition?: Yes      Copied from CRM [id number]. Topic: Clinical - Red Word Triage >> Mar 16, 2024  2:37 PM Jasmin G wrote: Red Word that prompted transfer to Nurse Triage: Pt states that he has been experiencing abdominal pain on and off for about 2 weeks now, no other symptoms. Reason for Disposition  [1] MODERATE pain (e.g., interferes with normal activities) AND [2] pain comes and goes (cramps) AND [3] present > 24 hours  (Exception: Pain with Vomiting or Diarrhea - see that Guideline.)  Answer Assessment - Initial Assessment Questions Pt offered earlier appointment but states he has another appointment scheduled at that times. Pt advised to go to ED for worsening symptoms and verbalized understanding.   1. LOCATION: Where does it hurt?      R side Lower Abdominal area does move to L side at times  2. RADIATION: Does the pain shoot anywhere else? (e.g., chest, back)     He denies states it moves arounnd his abdominal area.  3. ONSET: When did the pain begin? (Minutes, hours or days ago)      2 weeks  4. SUDDEN: Gradual or sudden onset?     Gradual  5. PATTERN Does the pain come and go, or is it constant?     Comes and goes  6. SEVERITY: How bad is the pain?  (e.g., Scale 1-10; mild, moderate, or severe)     Severe when it occurs  7. CAUSE: What do you think is causing the stomach pain? (e.g., gallstones, recent abdominal surgery)     Hx of an aorta problem and had a shunt in brain 3 years ago.  8. OTHER SYMPTOMS: Do you have any other symptoms?  (e.g., back pain, diarrhea, fever, urination pain, vomiting)       Denies any other symptoms  Protocols used: Abdominal Pain - Male-A-AH

## 2024-03-17 NOTE — Telephone Encounter (Signed)
 Noted. Agree with nursing triage decision. Sophronia Shivers, NP evaluation.

## 2024-03-18 ENCOUNTER — Ambulatory Visit (INDEPENDENT_AMBULATORY_CARE_PROVIDER_SITE_OTHER): Admitting: General Practice

## 2024-03-18 ENCOUNTER — Ambulatory Visit
Admission: RE | Admit: 2024-03-18 | Discharge: 2024-03-18 | Disposition: A | Source: Ambulatory Visit | Attending: General Practice | Admitting: General Practice

## 2024-03-18 ENCOUNTER — Encounter: Payer: Self-pay | Admitting: General Practice

## 2024-03-18 VITALS — BP 118/78 | HR 63 | Temp 97.8°F | Ht 70.0 in | Wt 184.0 lb

## 2024-03-18 DIAGNOSIS — R1011 Right upper quadrant pain: Secondary | ICD-10-CM | POA: Diagnosis not present

## 2024-03-18 DIAGNOSIS — I714 Abdominal aortic aneurysm, without rupture, unspecified: Secondary | ICD-10-CM

## 2024-03-18 NOTE — Patient Instructions (Signed)
 Complete xray(s) prior to leaving today. I will notify you of your results once received.  Start keeping a food and symptom journal.   F/u if symptoms worsen or do not improve.   It was a pleasure meeting you!

## 2024-03-18 NOTE — Progress Notes (Signed)
 Established Patient Office Visit  Subjective   Patient ID: Adam Juarez, male    DOB: 1940-12-03  Age: 83 y.o. MRN: [id number]  Chief Complaint  Patient presents with   Abdominal Pain    Upper right side; sometimes was on the left side off and on. Patient states this has been an ongoing issue for several months but got worse when he called for an appt; now today is better.     HPI  Adam Juarez is a 83 year old male, patient of Mallie Gaskins, NP, with past medical history of GERD, chronic consitpation, OSA, afib presents today for an acute visit to discuss abdominal pain.   His wife is also present today.   Discussed the use of AI scribe software for clinical note transcription with the patient, who gave verbal consent to proceed.  History of Present Illness Adam Juarez is an 82 year old male with abdominal aortic aneurysm and atrial fibrillation who presents with right upper quadrant abdominal pain.  He experiences sharp, stabbing pain in the right upper quadrant under the rib cage, occasionally occurring on the left side but less severe. The pain is intermittent, rated as 8 out of 10, and is not consistently associated with food intake, occurring even when he has not eaten. He has tried stopping activities and sitting down to alleviate the pain, which sometimes helps.  He has a history of an abdominal aortic aneurysm, last measured at 4.6 cm in September. He is concerned about the possibility of the aneurysm causing pain. He also has a history of atrial fibrillation and is on a blood thinner.  He reports occasional chest pain last week, which resolved the next day. No fever, chills, nausea, vomiting, or significant changes in bowel habits. He has daily bowel movements without straining and denies any urinary burning, though he experiences some urinary hesitancy, which he attributes to an enlarged prostate.  He mentions a shunt that runs from his brain, which he speculates might be  causing the pain due to its length and positioning. No headaches or dizziness.    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   Other specified counseling 05/25/2022   (Idiopathic) normal pressure hydrocephalus (HCC) 05/25/2022   Paroxysmal tachycardia (HCC) 05/25/2022   Elevated bilirubin 08/04/2021   History of macular degeneration 06/22/2021   Chronic abdominal pain 11/10/2020   Proctalgia 10/27/2020   Hemorrhoids 07/21/2020   Colon cancer screening 07/14/2020   Prolapsed internal hemorrhoids, grade 3 04/21/2020   VP (ventriculoperitoneal) shunt status 04/21/2020   Normal pressure hydrocephalus (HCC) 01/08/2020   OSA (obstructive sleep apnea) 10/19/2019   Hip pain 07/15/2019   Medicare annual wellness visit, subsequent 02/06/2019   Cognitive impairment 11/21/2018   Aortic atherosclerosis 03/27/2018   PAD (peripheral artery disease) 03/27/2018   Smoking history 03/27/2018   Chronic left shoulder pain 03/13/2018   History of systemic reaction to bee sting 01/28/2018   Prediabetes 01/28/2018   History of elevated PSA 01/28/2018   AAA (abdominal aortic aneurysm) 05/22/2017   Osteoarthritis 07/13/2016   Chronic foot pain 07/13/2016   Encounter for abdominal aortic aneurysm (AAA) screening 02/17/2016   Mixed hyperlipidemia 11/27/2015   Chronic back pain 11/27/2015   Chronic fatigue 11/27/2015   SVT (supraventricular tachycardia) 09/16/2015   Paroxysmal atrial fibrillation (HCC) 09/16/2015   Frequent PVCs 09/16/2015   Esophageal reflux 09/16/2015   Preventative health care 09/16/2015   Hypertriglyceridemia 04/21/2015  Past Medical History:  Diagnosis Date   Arthritis    fingers   Cancer (HCC) 1991, 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 a-fib x 1 episode patient states he auto corrected seen at [facility]   Foul smelling urine  06/22/2022   GERD (gastroesophageal reflux disease)    Headache    poor posture - none recently   History of kidney stones    Hydrocephalus (HCC)    Hyperlipidemia    Neuropathy    PSVT (paroxysmal supraventricular tachycardia) 2009   Controlled with breathing process   Renal stone 9/08, 6/15   Wears dentures    full upper   Past Surgical History:  Procedure Laterality Date   CARDIAC CATHETERIZATION  2008   [place], [place]   CATARACT EXTRACTION [facility] Left 07/27/2015   Procedure: CATARACT EXTRACTION PHACO AND INTRAOCULAR LENS PLACEMENT (IOC);  Surgeon: Dene Etienne, MD;  Location: [facility];  Service: Ophthalmology;  Laterality: Left;  TORIC   CATARACT EXTRACTION W/PHACO Right 08/24/2015   Procedure: CATARACT EXTRACTION PHACO AND INTRAOCULAR LENS PLACEMENT (IOC);  Surgeon: Dene Etienne, MD;  Location: [facility];  Service: Ophthalmology;  Laterality: Right;  TORIC   COLONOSCOPY WITH PROPOFOL  N/A 08/10/2020   Procedure: COLONOSCOPY WITH PROPOFOL ;  Surgeon: Toledo, Ladell POUR, MD;  Location: [facility] ENDOSCOPY;  Service: Gastroenterology;  Laterality: N/A;   HEMORROIDECTOMY  2010   banding   IR ANGIO INTRA EXTRACRAN SEL COM CAROTID INNOMINATE BILAT MOD SED  10/30/2019   IR ANGIO VERTEBRAL SEL VERTEBRAL UNI R MOD SED  10/30/2019   IR RADIOLOGIST EVAL & MGMT  04/11/2020   IR US  GUIDE VASC ACCESS RIGHT  10/30/2019   KIDNEY STONE SURGERY  9/08, 6/15   lithotripsy   LAPAROSCOPIC REVISION VENTRICULAR-PERITONEAL (V-P) SHUNT  01/08/2020   Procedure: LAPAROSCOPIC REVISION VENTRICULAR-PERITONEAL (V-P) SHUNT;  Surgeon: Lanis Pupa, MD;  Location: [facility] OR;  Service: Neurosurgery;;   PROSTATE BIOPSY  2007   SKIN CANCER EXCISION     TOENAIL TRIMMING  11/2017   Dr. Lilli   TONSILLECTOMY     VASECTOMY     VENTRICULOPERITONEAL SHUNT Right 01/08/2020   Procedure: SHUNT INSERTION VENTRICULAR-PERITONEAL;  Surgeon: Lanis Pupa, MD;  Location: [facility] OR;  Service:  Neurosurgery;  Laterality: Right;   Allergies  Allergen Reactions   Bee Venom Anaphylaxis and Hives   Other Other (See Comments)    Pistachios - tickles throat  Pistachios - tickles throat   Tramadol Other (See Comments)         08/16/2023   10:35 AM 06/13/2023    1:57 PM 05/31/2023   11:59 AM  Depression screen PHQ 2/9  Decreased Interest 1 0 0  Down, Depressed, Hopeless 1 0 0  PHQ - 2 Score 2 0 0  Altered sleeping 1  0  Tired, decreased energy 1  1  Change in appetite 1  0  Feeling bad or failure about yourself  1  0  Trouble concentrating 2  1  Moving slowly or fidgety/restless 2  0  Suicidal thoughts 0  0  PHQ-9 Score 10  2  Difficult doing work/chores Very difficult Not difficult at all Somewhat difficult       05/31/2023   12:00 PM 03/26/2023   11:02 AM 07/21/2020    3:02 PM  GAD 7 : Generalized Anxiety Score  Nervous, Anxious, on Edge 0 1 0  Control/stop worrying 0  1 0  Worry too much - different things 0 1 0  Trouble relaxing 0 0 0  Restless 0 0 0  Easily annoyed or irritable 0 1 1  Afraid - awful might happen 0 1 0  Total GAD 7 Score 0 5 1  Anxiety Difficulty  Not difficult at all       Review of Systems  Constitutional:  Negative for chills and fever.  Respiratory:  Negative for shortness of breath.   Cardiovascular:  Negative for chest pain.  Gastrointestinal:  Positive for abdominal pain. Negative for constipation, diarrhea, heartburn, nausea and vomiting.  Genitourinary:  Negative for dysuria, frequency and urgency.  Neurological:  Negative for dizziness and headaches.  Endo/Heme/Allergies:  Negative for polydipsia.  Psychiatric/Behavioral:  Negative for depression and suicidal ideas. The patient is not nervous/anxious.       Objective:     BP 118/78   Pulse 63   Temp 97.8 F (36.6 C) (Oral)   Ht 5' 10 (1.778 m)   Wt 184 lb (83.5 kg)   SpO2 98%   BMI 26.40 kg/m  BP Readings from Last 3 Encounters:  03/18/24 118/78  01/10/24 136/84   09/30/23 110/70   Wt Readings from Last 3 Encounters:  03/18/24 184 lb (83.5 kg)  01/10/24 187 lb (84.8 kg)  09/30/23 193 lb 8 oz (87.8 kg)      Physical Exam Vitals and nursing note reviewed.  Constitutional:      Appearance: Normal appearance.  Cardiovascular:     Rate and Rhythm: Normal rate and regular rhythm.     Pulses: Normal pulses.     Heart sounds: Normal heart sounds.  Pulmonary:     Effort: Pulmonary effort is normal.     Breath sounds: Normal breath sounds.  Abdominal:     General: Bowel sounds are normal. There is no distension.     Palpations: Abdomen is soft. There is no mass.     Tenderness: There is no abdominal tenderness. There is no right CVA tenderness, left CVA tenderness, guarding or rebound.     Hernia: No hernia is present.  Skin:    General: Skin is warm.  Neurological:     Mental Status: He is alert and oriented to person, place, and time.  Psychiatric:        Mood and Affect: Mood normal.        Behavior: Behavior normal.        Thought Content: Thought content normal.        Judgment: Judgment normal.      No results found for any visits on 03/18/24.     The ASCVD Risk score (Arnett DK, et al., 2019) failed to calculate for the following reasons:   The 2019 ASCVD risk score is only valid for ages 31 to 48    Assessment & Plan:  Abdominal pain, RUQ -     DG Abd 2 Views     Assessment and Plan Assessment & Plan Right upper quadrant abdominal pain Intermittent sharp pain, differential includes gallbladder issues or constipation. No signs of obstruction. - Ordered abdominal x-ray. - Consider gallbladder ultrasound if x-ray negative. - Advised food and symptom journal.  Abdominal aortic aneurysm, stable Aneurysm 4.6 cm, stable. No symptoms of rupture or complications. - Reviewed ultrasound results in epic.  Urinary hesitancy due to benign prostatic hyperplasia Urinary hesitancy likely due to benign prostatic  hyperplasia.    Return if symptoms worsen or fail to improve.  Carrol Aurora, NP

## 2024-03-21 ENCOUNTER — Other Ambulatory Visit: Payer: Self-pay | Admitting: Cardiovascular Disease

## 2024-03-21 DIAGNOSIS — I480 Paroxysmal atrial fibrillation: Secondary | ICD-10-CM

## 2024-03-23 NOTE — Telephone Encounter (Signed)
 Eliquis  5mg  refill request received. Patient is 83 years old, weight-83.5kg, Crea-0.77 on 08/16/23, Diagnosis-Afib, and last seen by Dr. Gollan on 08/06/23. Dose is appropriate based on dosing criteria. Will send in refill to requested pharmacy.

## 2024-03-24 ENCOUNTER — Ambulatory Visit: Payer: Self-pay | Admitting: General Practice

## 2024-04-28 ENCOUNTER — Ambulatory Visit: Payer: Self-pay

## 2024-04-28 NOTE — Telephone Encounter (Signed)
 FYI Only or Action Required?: FYI only for provider: appointment scheduled on 05/01/24 with Dr. Cleatus.  Patient was last seen in primary care on 03/18/2024 by Vincente Shivers, NP.  Called Nurse Triage reporting Abdominal Pain.  Symptoms began several months ago.  Interventions attempted: Ice/heat application.  Symptoms are: gradually worsening.  Triage Disposition: See Physician Within 24 Hours  Patient/caregiver understands and will follow disposition?: Yes   Copied from CRM #[id number]. Topic: Clinical - Red Word Triage >> Apr 28, 2024  1:02 PM China J wrote: [facility] that prompted transfer to Nurse Triage: The patient has been having upper abdominal pain. Reason for Disposition  [1] MILD pain (e.g., does not interfere with normal activities) AND [2] pain comes and goes (cramps) [3] present > 48 hours  (Exception: This same abdominal pain is a chronic symptom recurrent or ongoing AND present > 4 weeks.)  Answer Assessment - Initial Assessment Questions Pt called in stating that he has had RUQ pain since this summer. States that initially pain started as a 1 then progressed to a 4 and now is an 8/10. Pt reports pain is relieved with heat and comes and goes. Pain is worse in the am/ early afternoon but never keeps him up at night. Pt denies change in bowel/bladder, no n/v, no radiation of pain. Pt reports his wife had her gallbladder removed a couple years ago with the same symptoms so he wanted to f/u with PCP for eval. Appointment scheduled for evaluation. Patient agrees with plan of care, and will call back if anything changes, or if symptoms worsen.    1. LOCATION: Where does it hurt?      URQ  2. RADIATION: Does the pain shoot anywhere else? (e.g., chest, back)     None   3. ONSET: When did the pain begin? (Minutes, hours or days ago)      Since summer   4. SUDDEN: Gradual or sudden onset?     Gradual onset over a couple weeks   5. PATTERN Does the pain come and go,  or is it constant?     Comes and goes   6. SEVERITY: How bad is the pain?  (e.g., Scale 1-10; mild, moderate, or severe)     Started as a 1 then increased to a 4 and now present more often, 8/10  7. RECURRENT SYMPTOM: Have you ever had this type of stomach pain before? If Yes, ask: When was the last time? and What happened that time?      No   8. CAUSE: What do you think is causing the stomach pain? (e.g., gallstones, recent abdominal surgery)     Unknown   9. RELIEVING/AGGRAVATING FACTORS: What makes it better or worse? (e.g., antacids, bending or twisting motion, bowel movement)     Better with heat; no aggravating factors   10. OTHER SYMPTOMS: Do you have any other symptoms? (e.g., back pain, diarrhea, fever, urination pain, vomiting)       None  Protocols used: Abdominal Pain - Male-A-AH

## 2024-04-29 NOTE — Telephone Encounter (Signed)
 Noted. Agree with nursing triage decision. Appreciate Dr Elfredia evaluation.

## 2024-05-01 ENCOUNTER — Encounter: Payer: Self-pay | Admitting: Family Medicine

## 2024-05-01 ENCOUNTER — Ambulatory Visit: Admitting: Family Medicine

## 2024-05-01 DIAGNOSIS — R1011 Right upper quadrant pain: Secondary | ICD-10-CM | POA: Insufficient documentation

## 2024-05-01 LAB — CBC WITH DIFFERENTIAL/PLATELET
Basophils Absolute: 0 K/uL (ref 0.0–0.1)
Basophils Relative: 1.1 % (ref 0.0–3.0)
Eosinophils Absolute: 0.2 K/uL (ref 0.0–0.7)
Eosinophils Relative: 5.3 % — ABNORMAL HIGH (ref 0.0–5.0)
HCT: 42.3 % (ref 39.0–52.0)
Hemoglobin: 14.4 g/dL (ref 13.0–17.0)
Lymphocytes Relative: 21 % (ref 12.0–46.0)
Lymphs Abs: 0.8 K/uL (ref 0.7–4.0)
MCHC: 34.1 g/dL (ref 30.0–36.0)
MCV: 98.1 fl (ref 78.0–100.0)
Monocytes Absolute: 0.4 K/uL (ref 0.1–1.0)
Monocytes Relative: 11.8 % (ref 3.0–12.0)
Neutro Abs: 2.2 K/uL (ref 1.4–7.7)
Neutrophils Relative %: 60.8 % (ref 43.0–77.0)
Platelets: 125 K/uL — ABNORMAL LOW (ref 150.0–400.0)
RBC: 4.31 Mil/uL (ref 4.22–5.81)
RDW: 13.4 % (ref 11.5–15.5)
WBC: 3.6 K/uL — ABNORMAL LOW (ref 4.0–10.5)

## 2024-05-01 LAB — COMPREHENSIVE METABOLIC PANEL WITH GFR
ALT: 11 U/L (ref 3–53)
AST: 16 U/L (ref 5–37)
Albumin: 4.5 g/dL (ref 3.5–5.2)
Alkaline Phosphatase: 67 U/L (ref 39–117)
BUN: 19 mg/dL (ref 6–23)
CO2: 29 meq/L (ref 19–32)
Calcium: 9.9 mg/dL (ref 8.4–10.5)
Chloride: 103 meq/L (ref 96–112)
Creatinine, Ser: 0.87 mg/dL (ref 0.40–1.50)
GFR: 80.47 mL/min
Glucose, Bld: 105 mg/dL — ABNORMAL HIGH (ref 70–99)
Potassium: 4.2 meq/L (ref 3.5–5.1)
Sodium: 139 meq/L (ref 135–145)
Total Bilirubin: 0.7 mg/dL (ref 0.2–1.2)
Total Protein: 7.2 g/dL (ref 6.0–8.3)

## 2024-05-01 LAB — LIPASE: Lipase: 25 U/L (ref 11.0–59.0)

## 2024-05-01 NOTE — Patient Instructions (Addendum)
 Go to the lab on the way out.   If you have mychart we'll likely use that to update you.    Let us  know if you don't get a call about scheduling the ultrasound.  If you have severe pain, then go to the ER.  Take care.  Glad to see you. I would avoid fatty meals in the meantime.

## 2024-05-01 NOTE — Assessment & Plan Note (Signed)
 Intermittent RUQ pain. Okay for outpatient f/u.  Routine cautions d/w pt.   If severe pain, then go to the ER.  I would avoid fatty meals in the meantime. Rationale d/w pt.  See notes on labs.   U/s ordered.   Rationale for w/u d/w pt.

## 2024-05-01 NOTE — Progress Notes (Signed)
 Longstanding intermittent RUQ pain.  No pain today.  He still has his gall bladder.  Can have sx early AM, late night, variable timing.  No pain with movement, rolling over in bed. No FCNAVD.  No blood in stool.  Some occ constipation.  Still on eliquis .  No clear food trigger for sx.    Prev CT.  Hepatobiliary: Mild fatty liver infiltration. Gallbladder is present. Patent portal vein.   Pancreas: Unremarkable. No pancreatic ductal dilatation or surrounding inflammatory changes.   Meds, vitals, and allergies reviewed.   ROS: Per HPI unless specifically indicated in ROS section   Nad N[place] Neck supple, no LA ctab RRR with SEM Minimal RUQ soreness at the exam but not ttp.  Abd not ttp o/w.  No rebound.  Normal BS.  No jaundice.  No BLE edema.  Skin well perfused.

## 2024-05-02 ENCOUNTER — Ambulatory Visit (HOSPITAL_BASED_OUTPATIENT_CLINIC_OR_DEPARTMENT_OTHER)
Admission: RE | Admit: 2024-05-02 | Discharge: 2024-05-02 | Disposition: A | Discharge status: Home / Self Care | Source: Ambulatory Visit | Attending: Family Medicine | Admitting: Family Medicine

## 2024-05-02 DIAGNOSIS — R1011 Right upper quadrant pain: Secondary | ICD-10-CM | POA: Diagnosis present

## 2024-05-05 ENCOUNTER — Ambulatory Visit: Payer: Self-pay | Admitting: Family Medicine

## 2024-05-05 DIAGNOSIS — R1011 Right upper quadrant pain: Secondary | ICD-10-CM

## 2024-06-02 ENCOUNTER — Ambulatory Visit: Admitting: General Surgery

## 2024-06-02 ENCOUNTER — Encounter: Payer: Self-pay | Admitting: General Surgery

## 2024-06-02 VITALS — BP 108/63 | HR 74 | Ht 70.0 in | Wt 179.0 lb

## 2024-06-02 DIAGNOSIS — R1010 Upper abdominal pain, unspecified: Secondary | ICD-10-CM

## 2024-06-02 DIAGNOSIS — K5900 Constipation, unspecified: Secondary | ICD-10-CM | POA: Diagnosis not present

## 2024-06-02 NOTE — Patient Instructions (Signed)
 We do recommend taking a daily fiber supplement. This is for your colon health as well as keeping your bowel movements regular. You may try different things like Metamucil, Benefiber, or Citrucel. Generic is fine. Be sure to drink plenty of water/fluids with this.    Follow-up with our office as needed.  Please call and ask to speak with a nurse if you develop questions or concerns.

## 2024-06-04 ENCOUNTER — Other Ambulatory Visit: Payer: Self-pay

## 2024-06-04 DIAGNOSIS — R972 Elevated prostate specific antigen [PSA]: Secondary | ICD-10-CM

## 2024-06-10 NOTE — Progress Notes (Signed)
 Patient ID: Adam Juarez, male   DOB: 14-Oct-1940, 84 y.o.   MRN: [id number] CC: Abdominal Pain History of Present Illness Adam Juarez is a 84 y.o. 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  The patient reports that over the last several months he has had intermittent abdominal pain that is more in the right upper quadrant.  He said that this is cramping in nature.  He denies that there is any association with food.  He reports that he will also feel full.  He says that when he has a bowel movement that this pain usually goes away.  He does report a history of constipation and says symptoms he has to strain when he uses the bathroom.  He denies any blood in his stool.  He denies any fevers or chills.  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 a-fib x 1 episode patient states he auto corrected seen at [facility]   Foul smelling urine 06/22/2022   GERD (gastroesophageal reflux disease)    Headache    poor posture - none recently   History of kidney stones    Hydrocephalus (HCC)    Hyperlipidemia    Neuropathy    PSVT (paroxysmal supraventricular tachycardia) 2009   Controlled with breathing process   Renal stone 9/08, 6/15   Wears dentures    full upper       Past Surgical History:  Procedure Laterality Date   CARDIAC CATHETERIZATION  2008   [place], [place]   CATARACT EXTRACTION [facility] Left 07/27/2015   Procedure: CATARACT EXTRACTION PHACO AND INTRAOCULAR LENS PLACEMENT (IOC);  Surgeon: Dene Etienne, MD;  Location: [facility];  Service: Ophthalmology;  Laterality: Left;  TORIC   CATARACT EXTRACTION W/PHACO Right 08/24/2015   Procedure: CATARACT EXTRACTION PHACO AND INTRAOCULAR LENS PLACEMENT  (IOC);  Surgeon: Dene Etienne, MD;  Location: [facility];  Service: Ophthalmology;  Laterality: Right;  TORIC   COLONOSCOPY WITH PROPOFOL  N/A 08/10/2020   Procedure: COLONOSCOPY WITH PROPOFOL ;  Surgeon: Toledo, Ladell POUR, MD;  Location: [facility];  Service: Gastroenterology;  Laterality: N/A;   HEMORROIDECTOMY  2010   banding   IR ANGIO INTRA EXTRACRAN SEL COM CAROTID INNOMINATE BILAT MOD SED  10/30/2019   IR ANGIO VERTEBRAL SEL VERTEBRAL UNI R MOD SED  10/30/2019   IR RADIOLOGIST EVAL & MGMT  04/11/2020   IR US  GUIDE VASC ACCESS RIGHT  10/30/2019   KIDNEY STONE SURGERY  9/08, 6/15   lithotripsy   LAPAROSCOPIC REVISION VENTRICULAR-PERITONEAL (V-P) SHUNT  01/08/2020   Procedure: LAPAROSCOPIC REVISION VENTRICULAR-PERITONEAL (V-P) SHUNT;  Surgeon: Lanis Pupa, MD;  Location: [facility] OR;  Service: Neurosurgery;;   PROSTATE BIOPSY  2007   SKIN CANCER EXCISION     TOENAIL TRIMMING  11/2017   Dr. Lilli   TONSILLECTOMY     VASECTOMY     VENTRICULOPERITONEAL SHUNT Right 01/08/2020   Procedure: SHUNT INSERTION VENTRICULAR-PERITONEAL;  Surgeon: Lanis Pupa, MD;  Location: [facility] OR;  Service: Neurosurgery;  Laterality: Right;    Allergies[1]  Current Outpatient Medications  Medication Sig Dispense Refill   acetaminophen  (TYLENOL ) 500 MG tablet Take 1,000 mg by mouth every 8 (eight) hours as needed for moderate pain.  ascorbic acid (VITAMIN C) 500 MG tablet Take 500 mg by mouth daily.     CALCIUM -VITAMIN D PO Take 600-800 mg by mouth daily.     ELIQUIS  5 MG TABS tablet TAKE 1 TABLET BY MOUTH TWICE A DAY 60 tablet 5   EPINEPHrine  0.3 mg/0.3 mL IJ SOAJ injection Inject 0.3 mg into the muscle as needed for anaphylaxis. 1 each 0   Magnesium Oxide 500 MG TABS Take 500 mg by mouth daily.     memantine (NAMENDA) 5 MG tablet Take 5 mg by mouth 2 (two) times daily.     metoprolol  succinate (TOPROL -XL) 50 MG 24 hr tablet Take 50 mg in the morning, and 25 mg in the evening. 180  tablet 3   Multiple Vitamins-Minerals (PRESERVISION AREDS 2 PO) Take 1 capsule by mouth 2 (two) times daily.      omeprazole  (PRILOSEC) 20 MG capsule Take 1 capsule (20 mg total) by mouth daily. for heartburn. 90 capsule 1   Potassium Gluconate 550 MG TABS Take 550 mg by mouth daily.     Probiotic Product (PROBIOTIC DAILY PO) Take 1 capsule by mouth daily.      vitamin B-12 (CYANOCOBALAMIN ) 1000 MCG tablet Take 1,000 mcg by mouth daily.     No current facility-administered medications for this visit.    Family History Family History  Problem Relation Age of Onset   AAA (abdominal aortic aneurysm) Father    Parkinson's disease Mother    Arthritis Mother        Social History Social History[2]      ROS Full ROS of systems performed and is otherwise negative there than what is stated in the HPI  Physical Exam Blood pressure 108/63, pulse 74, height 5' 10 (1.778 m), weight 179 lb (81.2 kg), SpO2 97%.  Alert and oriented x 3, normal work of breathing room air, regular rate and rhythm, abdomen is soft, nontender nondistended, port site from where his VP shunt was inserted has healed well. Data Reviewed Labs reviewed and notable for slight leukopenia without any anemia, he has a normal hepatic function panel and normal total bilirubin.  KUB largely normal and he has a ultrasound that shows a fluid-filled gallbladder but no evidence of stones or acute cholecystitis.  I have personally reviewed the patient's imaging and medical records.    Assessment    I discussed with the patient that given this is a cramping pain that is relieved with bowel movements I am concerned that this is secondary to constipation and not necessarily from his gallbladder.  I recommended that he start a fiber supplementation and take MiraLAX  as needed for chronic constipation.  If the pain persists then he can return and we can further discuss the role of cholecystectomy.  Otherwise he can follow-up with  us ..  A total of 45 minutes was spent reviewing the patient's chart, performing history and physical and discussing treatment options with  Jayson MALVA Endow      [1]  Allergies Allergen Reactions   Bee Venom Anaphylaxis and Hives   Other Other (See Comments)    Pistachios - tickles throat  Pistachios - tickles throat   Tramadol Other (See Comments)  [2]  Social History Tobacco Use   Smoking status: Former    Current packs/day: 0.00    Average packs/day: 0.5 packs/day for 38.0 years (19.0 ttl pk-yrs)    Types: Cigarettes    Start date: 07/14/1959    Quit date: 07/13/1997    Years since  quitting: 26.9    Passive exposure: Past   Smokeless tobacco: Never  Vaping Use   Vaping status: Never Used  Substance Use Topics   Alcohol use: No   Drug use: No

## 2024-06-15 ENCOUNTER — Ambulatory Visit: Payer: TRICARE For Life (TFL)

## 2024-06-17 ENCOUNTER — Other Ambulatory Visit

## 2024-06-19 ENCOUNTER — Ambulatory Visit: Admitting: Urology

## 2024-06-24 ENCOUNTER — Ambulatory Visit: Admitting: Urology

## 2024-07-14 ENCOUNTER — Ambulatory Visit (INDEPENDENT_AMBULATORY_CARE_PROVIDER_SITE_OTHER): Admitting: Vascular Surgery

## 2024-07-14 ENCOUNTER — Other Ambulatory Visit (INDEPENDENT_AMBULATORY_CARE_PROVIDER_SITE_OTHER)

## 2024-08-25 ENCOUNTER — Ambulatory Visit: Admitting: Cardiovascular Disease

## 2024-10-21 ENCOUNTER — Ambulatory Visit
